# Patient Record
Sex: Female | Born: 1965 | Race: White | Hispanic: No | Smoking: Current every day smoker
Health system: Southern US, Community
[De-identification: ages and names within clinical notes are randomized; demographics above are authoritative.]

## PROBLEM LIST (undated history)

## (undated) ENCOUNTER — Emergency Department (HOSPITAL_COMMUNITY): Admission: EM | Payer: Medicare PPO | Source: Home / Self Care

## (undated) DIAGNOSIS — J189 Pneumonia, unspecified organism: Secondary | ICD-10-CM

## (undated) DIAGNOSIS — F419 Anxiety disorder, unspecified: Secondary | ICD-10-CM

## (undated) DIAGNOSIS — G4700 Insomnia, unspecified: Secondary | ICD-10-CM

## (undated) DIAGNOSIS — E669 Obesity, unspecified: Secondary | ICD-10-CM

## (undated) DIAGNOSIS — R51 Headache: Secondary | ICD-10-CM

## (undated) DIAGNOSIS — K279 Peptic ulcer, site unspecified, unspecified as acute or chronic, without hemorrhage or perforation: Secondary | ICD-10-CM

## (undated) DIAGNOSIS — D649 Anemia, unspecified: Secondary | ICD-10-CM

## (undated) DIAGNOSIS — M1712 Unilateral primary osteoarthritis, left knee: Secondary | ICD-10-CM

## (undated) DIAGNOSIS — T883XXA Malignant hyperthermia due to anesthesia, initial encounter: Secondary | ICD-10-CM

## (undated) DIAGNOSIS — R519 Headache, unspecified: Secondary | ICD-10-CM

## (undated) DIAGNOSIS — C801 Malignant (primary) neoplasm, unspecified: Secondary | ICD-10-CM

## (undated) DIAGNOSIS — K219 Gastro-esophageal reflux disease without esophagitis: Secondary | ICD-10-CM

## (undated) DIAGNOSIS — F32A Depression, unspecified: Secondary | ICD-10-CM

## (undated) DIAGNOSIS — M1990 Unspecified osteoarthritis, unspecified site: Secondary | ICD-10-CM

## (undated) DIAGNOSIS — M1711 Unilateral primary osteoarthritis, right knee: Secondary | ICD-10-CM

## (undated) DIAGNOSIS — I10 Essential (primary) hypertension: Secondary | ICD-10-CM

## (undated) DIAGNOSIS — F329 Major depressive disorder, single episode, unspecified: Secondary | ICD-10-CM

## (undated) DIAGNOSIS — M19012 Primary osteoarthritis, left shoulder: Secondary | ICD-10-CM

## (undated) DIAGNOSIS — M4800 Spinal stenosis, site unspecified: Secondary | ICD-10-CM

## (undated) DIAGNOSIS — F319 Bipolar disorder, unspecified: Secondary | ICD-10-CM

## (undated) DIAGNOSIS — M797 Fibromyalgia: Secondary | ICD-10-CM

## (undated) DIAGNOSIS — J449 Chronic obstructive pulmonary disease, unspecified: Secondary | ICD-10-CM

## (undated) HISTORY — PX: TONSILLECTOMY: SUR1361

## (undated) HISTORY — PX: OTHER SURGICAL HISTORY: SHX169

## (undated) HISTORY — PX: JOINT REPLACEMENT: SHX530

---

## 1984-10-13 HISTORY — PX: DILATION AND CURETTAGE OF UTERUS: SHX78

## 1988-10-13 HISTORY — PX: TUBAL LIGATION: SHX77

## 1996-10-13 HISTORY — PX: ABDOMINAL HYSTERECTOMY: SHX81

## 1999-08-13 ENCOUNTER — Emergency Department (HOSPITAL_COMMUNITY): Admission: EM | Admit: 1999-08-13 | Discharge: 1999-08-13 | Payer: Self-pay | Admitting: Emergency Medicine

## 1999-11-11 ENCOUNTER — Emergency Department (HOSPITAL_COMMUNITY): Admission: EM | Admit: 1999-11-11 | Discharge: 1999-11-11 | Payer: Self-pay | Admitting: Emergency Medicine

## 2001-04-12 ENCOUNTER — Encounter: Admission: RE | Admit: 2001-04-12 | Discharge: 2001-04-12 | Payer: Self-pay | Admitting: *Deleted

## 2001-06-22 ENCOUNTER — Emergency Department (HOSPITAL_COMMUNITY): Admission: EM | Admit: 2001-06-22 | Discharge: 2001-06-23 | Payer: Self-pay

## 2001-06-22 ENCOUNTER — Encounter: Payer: Self-pay | Admitting: Emergency Medicine

## 2001-06-23 ENCOUNTER — Encounter: Payer: Self-pay | Admitting: Emergency Medicine

## 2001-07-01 ENCOUNTER — Emergency Department (HOSPITAL_COMMUNITY): Admission: EM | Admit: 2001-07-01 | Discharge: 2001-07-02 | Payer: Self-pay | Admitting: Emergency Medicine

## 2001-07-02 ENCOUNTER — Encounter: Payer: Self-pay | Admitting: Emergency Medicine

## 2001-10-25 ENCOUNTER — Encounter: Admission: RE | Admit: 2001-10-25 | Discharge: 2001-10-25 | Payer: Self-pay | Admitting: *Deleted

## 2002-05-11 ENCOUNTER — Emergency Department (HOSPITAL_COMMUNITY): Admission: EM | Admit: 2002-05-11 | Discharge: 2002-05-11 | Payer: Self-pay | Admitting: Emergency Medicine

## 2002-05-15 ENCOUNTER — Emergency Department (HOSPITAL_COMMUNITY): Admission: EM | Admit: 2002-05-15 | Discharge: 2002-05-15 | Payer: Self-pay | Admitting: Emergency Medicine

## 2002-05-21 ENCOUNTER — Emergency Department (HOSPITAL_COMMUNITY): Admission: EM | Admit: 2002-05-21 | Discharge: 2002-05-22 | Payer: Self-pay | Admitting: Emergency Medicine

## 2002-05-27 ENCOUNTER — Encounter: Admission: RE | Admit: 2002-05-27 | Discharge: 2002-05-27 | Payer: Self-pay | Admitting: *Deleted

## 2002-06-12 ENCOUNTER — Emergency Department (HOSPITAL_COMMUNITY): Admission: EM | Admit: 2002-06-12 | Discharge: 2002-06-12 | Payer: Self-pay | Admitting: Emergency Medicine

## 2002-08-27 ENCOUNTER — Emergency Department (HOSPITAL_COMMUNITY): Admission: EM | Admit: 2002-08-27 | Discharge: 2002-08-27 | Payer: Self-pay | Admitting: Emergency Medicine

## 2002-11-15 ENCOUNTER — Emergency Department (HOSPITAL_COMMUNITY): Admission: EM | Admit: 2002-11-15 | Discharge: 2002-11-15 | Payer: Self-pay | Admitting: Emergency Medicine

## 2002-12-12 ENCOUNTER — Emergency Department (HOSPITAL_COMMUNITY): Admission: EM | Admit: 2002-12-12 | Discharge: 2002-12-12 | Payer: Self-pay | Admitting: Emergency Medicine

## 2003-01-18 ENCOUNTER — Encounter: Payer: Self-pay | Admitting: Family Medicine

## 2003-01-18 ENCOUNTER — Encounter: Admission: RE | Admit: 2003-01-18 | Discharge: 2003-01-18 | Payer: Self-pay | Admitting: Family Medicine

## 2003-02-08 ENCOUNTER — Encounter: Payer: Self-pay | Admitting: Emergency Medicine

## 2003-02-08 ENCOUNTER — Emergency Department (HOSPITAL_COMMUNITY): Admission: EM | Admit: 2003-02-08 | Discharge: 2003-02-08 | Payer: Self-pay | Admitting: Emergency Medicine

## 2003-03-07 ENCOUNTER — Emergency Department (HOSPITAL_COMMUNITY): Admission: EM | Admit: 2003-03-07 | Discharge: 2003-03-07 | Payer: Self-pay | Admitting: Emergency Medicine

## 2003-03-18 ENCOUNTER — Encounter: Payer: Self-pay | Admitting: Emergency Medicine

## 2003-03-18 ENCOUNTER — Emergency Department (HOSPITAL_COMMUNITY): Admission: EM | Admit: 2003-03-18 | Discharge: 2003-03-18 | Payer: Self-pay | Admitting: Emergency Medicine

## 2003-04-13 ENCOUNTER — Encounter: Payer: Self-pay | Admitting: Emergency Medicine

## 2003-04-13 ENCOUNTER — Emergency Department (HOSPITAL_COMMUNITY): Admission: EM | Admit: 2003-04-13 | Discharge: 2003-04-13 | Payer: Self-pay | Admitting: Emergency Medicine

## 2003-04-24 ENCOUNTER — Encounter: Payer: Self-pay | Admitting: Family Medicine

## 2003-04-24 ENCOUNTER — Encounter: Admission: RE | Admit: 2003-04-24 | Discharge: 2003-04-24 | Payer: Self-pay | Admitting: Family Medicine

## 2003-06-02 ENCOUNTER — Emergency Department (HOSPITAL_COMMUNITY): Admission: EM | Admit: 2003-06-02 | Discharge: 2003-06-02 | Payer: Self-pay

## 2003-06-22 ENCOUNTER — Emergency Department (HOSPITAL_COMMUNITY): Admission: EM | Admit: 2003-06-22 | Discharge: 2003-06-22 | Payer: Self-pay | Admitting: Emergency Medicine

## 2003-06-30 ENCOUNTER — Emergency Department (HOSPITAL_COMMUNITY): Admission: EM | Admit: 2003-06-30 | Discharge: 2003-06-30 | Payer: Self-pay | Admitting: Emergency Medicine

## 2003-06-30 ENCOUNTER — Encounter: Payer: Self-pay | Admitting: Emergency Medicine

## 2003-10-14 HISTORY — PX: KNEE ARTHROSCOPY: SHX127

## 2004-04-24 ENCOUNTER — Emergency Department (HOSPITAL_COMMUNITY): Admission: EM | Admit: 2004-04-24 | Discharge: 2004-04-24 | Payer: Self-pay | Admitting: Emergency Medicine

## 2004-06-10 ENCOUNTER — Emergency Department (HOSPITAL_COMMUNITY): Admission: EM | Admit: 2004-06-10 | Discharge: 2004-06-10 | Payer: Self-pay | Admitting: Emergency Medicine

## 2004-07-06 ENCOUNTER — Emergency Department (HOSPITAL_COMMUNITY): Admission: EM | Admit: 2004-07-06 | Discharge: 2004-07-06 | Payer: Self-pay | Admitting: Emergency Medicine

## 2004-08-18 ENCOUNTER — Encounter: Admission: RE | Admit: 2004-08-18 | Discharge: 2004-08-18 | Payer: Self-pay | Admitting: Family Medicine

## 2004-10-12 ENCOUNTER — Emergency Department (HOSPITAL_COMMUNITY): Admission: EM | Admit: 2004-10-12 | Discharge: 2004-10-12 | Payer: Self-pay | Admitting: Emergency Medicine

## 2007-04-27 ENCOUNTER — Emergency Department (HOSPITAL_COMMUNITY): Admission: EM | Admit: 2007-04-27 | Discharge: 2007-04-27 | Payer: Self-pay | Admitting: Emergency Medicine

## 2007-07-05 ENCOUNTER — Emergency Department (HOSPITAL_COMMUNITY): Admission: EM | Admit: 2007-07-05 | Discharge: 2007-07-05 | Payer: Self-pay | Admitting: Family Medicine

## 2007-10-08 ENCOUNTER — Emergency Department (HOSPITAL_COMMUNITY): Admission: EM | Admit: 2007-10-08 | Discharge: 2007-10-08 | Payer: Self-pay | Admitting: Emergency Medicine

## 2008-03-03 ENCOUNTER — Emergency Department (HOSPITAL_COMMUNITY): Admission: EM | Admit: 2008-03-03 | Discharge: 2008-03-03 | Payer: Self-pay | Admitting: Emergency Medicine

## 2008-07-13 ENCOUNTER — Emergency Department (HOSPITAL_COMMUNITY): Admission: EM | Admit: 2008-07-13 | Discharge: 2008-07-13 | Payer: Self-pay | Admitting: Family Medicine

## 2009-01-04 ENCOUNTER — Encounter (INDEPENDENT_AMBULATORY_CARE_PROVIDER_SITE_OTHER): Payer: Self-pay | Admitting: *Deleted

## 2009-01-04 ENCOUNTER — Emergency Department (HOSPITAL_COMMUNITY): Admission: EM | Admit: 2009-01-04 | Discharge: 2009-01-04 | Payer: Self-pay | Admitting: Family Medicine

## 2009-02-04 ENCOUNTER — Encounter: Admission: RE | Admit: 2009-02-04 | Discharge: 2009-02-04 | Payer: Self-pay | Admitting: Orthopedic Surgery

## 2009-02-21 DIAGNOSIS — IMO0001 Reserved for inherently not codable concepts without codable children: Secondary | ICD-10-CM | POA: Insufficient documentation

## 2009-02-21 DIAGNOSIS — K219 Gastro-esophageal reflux disease without esophagitis: Secondary | ICD-10-CM | POA: Insufficient documentation

## 2009-02-21 DIAGNOSIS — K589 Irritable bowel syndrome without diarrhea: Secondary | ICD-10-CM

## 2009-02-21 DIAGNOSIS — M542 Cervicalgia: Secondary | ICD-10-CM | POA: Insufficient documentation

## 2009-02-21 DIAGNOSIS — F319 Bipolar disorder, unspecified: Secondary | ICD-10-CM | POA: Insufficient documentation

## 2009-02-23 ENCOUNTER — Ambulatory Visit: Payer: Self-pay | Admitting: Gastroenterology

## 2009-02-23 DIAGNOSIS — Z8541 Personal history of malignant neoplasm of cervix uteri: Secondary | ICD-10-CM

## 2009-02-23 DIAGNOSIS — F329 Major depressive disorder, single episode, unspecified: Secondary | ICD-10-CM | POA: Insufficient documentation

## 2009-02-23 DIAGNOSIS — R1310 Dysphagia, unspecified: Secondary | ICD-10-CM | POA: Insufficient documentation

## 2009-02-23 DIAGNOSIS — F411 Generalized anxiety disorder: Secondary | ICD-10-CM | POA: Insufficient documentation

## 2009-02-23 DIAGNOSIS — M129 Arthropathy, unspecified: Secondary | ICD-10-CM | POA: Insufficient documentation

## 2009-03-02 ENCOUNTER — Ambulatory Visit: Payer: Self-pay | Admitting: Gastroenterology

## 2009-03-02 ENCOUNTER — Encounter: Payer: Self-pay | Admitting: Gastroenterology

## 2009-03-06 ENCOUNTER — Encounter: Payer: Self-pay | Admitting: Gastroenterology

## 2009-03-28 ENCOUNTER — Ambulatory Visit: Payer: Self-pay | Admitting: Pulmonary Disease

## 2009-03-28 DIAGNOSIS — G4730 Sleep apnea, unspecified: Secondary | ICD-10-CM

## 2009-03-28 DIAGNOSIS — G4710 Hypersomnia, unspecified: Secondary | ICD-10-CM | POA: Insufficient documentation

## 2009-04-18 ENCOUNTER — Encounter: Payer: Self-pay | Admitting: Pulmonary Disease

## 2009-04-18 ENCOUNTER — Ambulatory Visit (HOSPITAL_BASED_OUTPATIENT_CLINIC_OR_DEPARTMENT_OTHER): Admission: RE | Admit: 2009-04-18 | Discharge: 2009-04-18 | Payer: Self-pay | Admitting: Pulmonary Disease

## 2009-04-25 ENCOUNTER — Ambulatory Visit: Payer: Self-pay | Admitting: Pulmonary Disease

## 2009-05-03 ENCOUNTER — Telehealth (INDEPENDENT_AMBULATORY_CARE_PROVIDER_SITE_OTHER): Payer: Self-pay | Admitting: *Deleted

## 2009-05-22 ENCOUNTER — Ambulatory Visit: Payer: Self-pay | Admitting: Pulmonary Disease

## 2009-05-26 DIAGNOSIS — G2589 Other specified extrapyramidal and movement disorders: Secondary | ICD-10-CM

## 2010-03-13 HISTORY — PX: TRANSTHORACIC ECHOCARDIOGRAM: SHX275

## 2010-03-13 HISTORY — PX: CARDIOVASCULAR STRESS TEST: SHX262

## 2010-03-28 ENCOUNTER — Ambulatory Visit: Payer: Self-pay | Admitting: Cardiology

## 2010-03-28 ENCOUNTER — Observation Stay (HOSPITAL_COMMUNITY): Admission: EM | Admit: 2010-03-28 | Discharge: 2010-03-30 | Payer: Self-pay | Admitting: Emergency Medicine

## 2010-04-08 ENCOUNTER — Encounter: Payer: Self-pay | Admitting: *Deleted

## 2010-04-08 ENCOUNTER — Telehealth (INDEPENDENT_AMBULATORY_CARE_PROVIDER_SITE_OTHER): Payer: Self-pay | Admitting: *Deleted

## 2010-04-09 ENCOUNTER — Encounter: Payer: Self-pay | Admitting: Cardiology

## 2010-04-09 ENCOUNTER — Ambulatory Visit: Payer: Self-pay

## 2010-04-09 ENCOUNTER — Ambulatory Visit: Payer: Self-pay | Admitting: Cardiology

## 2010-04-09 ENCOUNTER — Encounter (HOSPITAL_COMMUNITY): Admission: RE | Admit: 2010-04-09 | Discharge: 2010-06-19 | Payer: Self-pay | Admitting: Cardiology

## 2010-04-11 ENCOUNTER — Ambulatory Visit: Payer: Self-pay

## 2010-04-23 ENCOUNTER — Emergency Department (HOSPITAL_COMMUNITY): Admission: EM | Admit: 2010-04-23 | Discharge: 2010-04-23 | Payer: Self-pay | Admitting: Emergency Medicine

## 2010-05-26 ENCOUNTER — Emergency Department (HOSPITAL_COMMUNITY): Admission: EM | Admit: 2010-05-26 | Discharge: 2010-05-26 | Payer: Self-pay | Admitting: Emergency Medicine

## 2010-08-20 ENCOUNTER — Emergency Department (HOSPITAL_COMMUNITY): Admission: EM | Admit: 2010-08-20 | Discharge: 2010-08-20 | Payer: Self-pay | Admitting: Emergency Medicine

## 2010-11-02 ENCOUNTER — Encounter: Payer: Self-pay | Admitting: Geriatric Medicine

## 2010-11-06 ENCOUNTER — Encounter
Admission: RE | Admit: 2010-11-06 | Discharge: 2010-11-06 | Payer: Self-pay | Source: Home / Self Care | Attending: Unknown Physician Specialty | Admitting: Unknown Physician Specialty

## 2010-11-14 NOTE — Progress Notes (Signed)
Summary: Nuclear Pre-Procedure  Phone Note Outgoing Call Call back at [facility] Phone [phone]   Call placed by: Stanton Kidney, EMT-P,  April 08, 2010 2:35 PM Call placed to: Patient Action Taken: Phone Call Completed Summary of Call: Reviewed information on Myoview Information Sheet (see scanned document for further details).  Spoke with Patient.    Nuclear Med Background Indications for Stress Test: Evaluation for Ischemia, Post Hospital  Indications Comments: 03/30/10 CP: (-) enzymes  History: Abnormal EKG, Echo  History Comments: 03/29/10 Echo: EF=50-55%  Symptoms: Chest Pressure, Nausea    Nuclear Pre-Procedure Cardiac Risk Factors: Obesity, Smoker Height (in): 64

## 2010-11-14 NOTE — Assessment & Plan Note (Signed)
Summary: Cardiology Nuclear Study  Nuclear Med Background Indications for Stress Test: Evaluation for Ischemia, Post Hospital  Indications Comments: 03/30/10 CP: (-) enzymes  History: Abnormal EKG, Echo  History Comments: 03/29/10 Echo: EF=50-55%  Symptoms: Chest Pressure, Nausea    Nuclear Pre-Procedure Cardiac Risk Factors: Obesity, Smoker Caffeine/Decaff Intake: None NPO After: 7:00 AM Lungs: clear IV 0.9% NS with Angio Cath: 22g     IV Site: (R) Hand IV Started by: Irean Hong RN Chest Size (in) 38     Cup Size DD     Height (in): 64 Weight (lb): 288 BMI: 49.61  Nuclear Med Study 1 or 2 day study:  2 day     Stress Test Type:  Eugenie Birks Reading MD:  Olga Millers, MD     Referring MD:  T.Wall Resting Radionuclide:  Technetium 83m Tetrofosmin     Resting Radionuclide Dose:  33 mCi  Stress Radionuclide:  Technetium 68m Tetrofosmin     Stress Radionuclide Dose:  33 mCi   Stress Protocol   Lexiscan: 0.4 mg   Stress Test Technologist:  Milana Na EMT-P     Nuclear Technologist:  Domenic Polite CNMT  Rest Procedure  Myocardial perfusion imaging was performed at rest 45 minutes following the intravenous administration of Myoview Technetium 78m Tetrofosmin.  Stress Procedure  The patient received IV Lexiscan 0.4 mg over 15-seconds.  Myoview injected at 30-seconds.  There were no significant changes with infusion.  Quantitative spect images were obtained after a 45 minute delay.  QPS Raw Data Images:  Mild breast attenuation.  Normal left ventricular size. Stress Images:  There is mild decreased uptake in the anterior wall. Rest Images:  There is mild decreased uptake in the anterior wall. Subtraction (SDS):  There is a fixed anterior defect that is most consistent with breast attenuation. Transient Ischemic Dilatation:  .99  (Normal <1.22)  Lung/Heart Ratio:  .28  (Normal <0.45)  Quantitative Gated Spect Images QGS EDV:  108 ml QGS ESV:  38 ml QGS EF:  65  % QGS cine images:  Normal wall motion.   Overall Impression  Exercise Capacity: Lexiscan study with no exercise. BP Response: Normal blood pressure response. Clinical Symptoms: There is  chest pain ECG Impression: No significant ST segment change suggestive of ischemia. Overall Impression: There is mild breast attenuation but no sign of scar or ischemia.  Appended Document: Cardiology Nuclear Study  Reviewed Juanito Doom, MD  Appended Document: Cardiology Nuclear Study PT AWARE./CY

## 2010-12-24 LAB — RAPID URINE DRUG SCREEN, HOSP PERFORMED
Amphetamines: NOT DETECTED
Benzodiazepines: NOT DETECTED
Opiates: POSITIVE — AB
Tetrahydrocannabinol: NOT DETECTED

## 2010-12-24 LAB — URINALYSIS, ROUTINE W REFLEX MICROSCOPIC
Hgb urine dipstick: NEGATIVE
Ketones, ur: NEGATIVE mg/dL
Nitrite: NEGATIVE
Protein, ur: NEGATIVE mg/dL

## 2010-12-27 LAB — CBC
MCV: 82.1 fL (ref 78.0–100.0)
RDW: 14.2 % (ref 11.5–15.5)

## 2010-12-27 LAB — COMPREHENSIVE METABOLIC PANEL
ALT: 24 U/L (ref 0–35)
Alkaline Phosphatase: 90 U/L (ref 39–117)
BUN: 7 mg/dL (ref 6–23)
Calcium: 10.5 mg/dL (ref 8.4–10.5)
Creatinine, Ser: 0.8 mg/dL (ref 0.4–1.2)
Glucose, Bld: 107 mg/dL — ABNORMAL HIGH (ref 70–99)
Potassium: 3.9 mEq/L (ref 3.5–5.1)
Total Protein: 8 g/dL (ref 6.0–8.3)

## 2010-12-27 LAB — POCT CARDIAC MARKERS
CKMB, poc: 1.6 ng/mL (ref 1.0–8.0)
Myoglobin, poc: 87 ng/mL (ref 12–200)
Troponin i, poc: <0.05 ng/mL (ref 0.00–0.09)

## 2010-12-27 LAB — DIFFERENTIAL
Basophils Absolute: 0 10*3/uL (ref 0.0–0.1)
Basophils Relative: 0 % (ref 0–1)
Eosinophils Absolute: 0.1 10*3/uL (ref 0.0–0.7)
Lymphocytes Relative: 31 % (ref 12–46)
Lymphs Abs: 2.7 10*3/uL (ref 0.7–4.0)
Neutro Abs: 5.3 10*3/uL (ref 1.7–7.7)
Neutrophils Relative %: 61 % (ref 43–77)

## 2010-12-30 LAB — COMPREHENSIVE METABOLIC PANEL
Albumin: 3.2 g/dL — ABNORMAL LOW (ref 3.5–5.2)
BUN: 8 mg/dL (ref 6–23)
Calcium: 8.9 mg/dL (ref 8.4–10.5)
Creatinine, Ser: 0.78 mg/dL (ref 0.4–1.2)
Total Protein: 6 g/dL (ref 6.0–8.3)

## 2010-12-30 LAB — CBC
HCT: 34.3 % — ABNORMAL LOW (ref 36.0–46.0)
HCT: 35 % — ABNORMAL LOW (ref 36.0–46.0)
MCHC: 34.2 g/dL (ref 30.0–36.0)
MCV: 85 fL (ref 78.0–100.0)
MCV: 85.2 fL (ref 78.0–100.0)
MCV: 85.4 fL (ref 78.0–100.0)
Platelets: 235 10*3/uL (ref 150–400)
Platelets: 246 10*3/uL (ref 150–400)
Platelets: 250 10*3/uL (ref 150–400)
RBC: 4.11 MIL/uL (ref 3.87–5.11)
RDW: 14.4 % (ref 11.5–15.5)
RDW: 14.8 % (ref 11.5–15.5)
WBC: 6.7 10*3/uL (ref 4.0–10.5)

## 2010-12-30 LAB — MAGNESIUM: Magnesium: 2.2 mg/dL (ref 1.5–2.5)

## 2010-12-30 LAB — DIFFERENTIAL
Basophils Absolute: 0 10*3/uL (ref 0.0–0.1)
Basophils Relative: 0 % (ref 0–1)
Eosinophils Absolute: 0.2 10*3/uL (ref 0.0–0.7)
Lymphocytes Relative: 45 % (ref 12–46)
Lymphs Abs: 3.4 10*3/uL (ref 0.7–4.0)
Monocytes Absolute: 0.4 10*3/uL (ref 0.1–1.0)
Monocytes Relative: 6 % (ref 3–12)
Neutro Abs: 2.9 10*3/uL (ref 1.7–7.7)
Neutrophils Relative %: 39 % — ABNORMAL LOW (ref 43–77)

## 2010-12-30 LAB — POCT CARDIAC MARKERS
Myoglobin, poc: 45 ng/mL (ref 12–200)
Troponin i, poc: <0.05 ng/mL (ref 0.00–0.09)
Troponin i, poc: <0.05 ng/mL (ref 0.00–0.09)

## 2010-12-30 LAB — BASIC METABOLIC PANEL
BUN: 11 mg/dL (ref 6–23)
BUN: 8 mg/dL (ref 6–23)
Calcium: 9.4 mg/dL (ref 8.4–10.5)
Chloride: 106 mEq/L (ref 96–112)
Chloride: 109 mEq/L (ref 96–112)
Creatinine, Ser: 0.87 mg/dL (ref 0.4–1.2)
GFR calc Af Amer: >60 mL/min (ref >60)
GFR calc non Af Amer: >60 mL/min (ref >60)
Glucose, Bld: 98 mg/dL (ref 70–99)

## 2010-12-30 LAB — CARDIAC PANEL(CRET KIN+CKTOT+MB+TROPI)
CK, MB: 1.3 ng/mL (ref 0.3–4.0)
Relative Index: 1.3 (ref 0.0–2.5)
Relative Index: INVALID (ref 0.0–2.5)
Total CK: 97 U/L (ref 7–177)
Total CK: 98 U/L (ref 7–177)
Troponin I: 0.01 ng/mL (ref 0.00–0.06)
Troponin I: 0.01 ng/mL (ref 0.00–0.06)

## 2010-12-30 LAB — LIPID PANEL
HDL: 37 mg/dL — ABNORMAL LOW (ref >39)
Triglycerides: 151 mg/dL — ABNORMAL HIGH (ref <150)
VLDL: 30 mg/dL (ref 0–40)

## 2010-12-30 LAB — TSH: TSH: 1.042 u[IU]/mL (ref 0.350–4.500)

## 2010-12-30 LAB — PHOSPHORUS: Phosphorus: 4.1 mg/dL (ref 2.3–4.6)

## 2011-02-25 NOTE — Procedures (Signed)
[name], [name]               ACCOUNT NO.:  [id number]   MEDICAL RECORD NO.:  [id number]          PATIENT TYPE:  OUT   LOCATION:  SLEEP CENTER                 FACILITY:  [facility]   PHYSICIAN:  Oretha Milch, MD      DATE OF BIRTH:  [birth year]   DATE OF STUDY:  04/18/2009                            NOCTURNAL POLYSOMNOGRAM   REFERRING PHYSICIAN:   INDICATION FOR THE STUDY:  Excessive daytime somnolence, loud snoring,  and witnessed apneas in this 45 year old woman.  At the time of the  study, her weight was 260 pounds, height feet 4 inches, BMI 45, neck  size 16.5 inches.   This overnight polysomnogram was performed with a sleep technologist in  attendance.  EEG, EOG, EMG, EKG, and respiratory parameters were  recorded.  Sleep stages, arousals, limb movements, and respiratory data  were scored according to criteria laid out by the American Academy of  Sleep Medicine.   SLEEP ARCHITECTURE:  Lights out was at 9:10 p.m., lights on was at 4:44  a.m.  Total sleep time was 342 minutes with a sleep period time of 374  minutes and a sleep maintenance efficiency of 82%.  Sleep latency was 80  minutes and latency to REM sleep was 91 minutes.  Sleep stages are the  percentage of total sleep time was N1 4%, N2 84%, N3 0% and REM sleep  12% (41.5 minutes, she spent 120 minutes in the supine position and 22  minutes in the supine REM sleep).   AROUSAL DATA:  There were a total of 47 arousals with an arousal index  of 8 events per hour, of these 35 were spontaneous, and the rest were  associated with respiratory events.   RESPIRATORY DATA:  There were a total of 3 obstructive apnea, 0 central  apnea, 0 mixed apneas, and 23 hypopneas and an apnea/hypopnea index of  4.6 events per hour.  There were 6 respiratory effort related arousals  (RERAs) with an RDI of 5.6 events per hour.  The supine AHI was 9.4  events per hour and the REM AHI was 19 events per hour.  The REM supine  AHI was 29 events  per hour, (1 apnea and 10 hypopneas in 22 minutes of  REM supine sleep).  Longest apnea was 18 seconds and longest hypopnea  was 29 seconds.   OXYGEN SATURATION DATA:  The lowest desaturation was 84% during REM  sleep.  She spent 1 minute with a saturation less than 98%.   LIMB MOVEMENT DATA:  No significant limb movements were noted.   CARDIAC DATA:  Low heart rate was 48 beats per minute during non-REM  sleep.   DISCUSSION:  Events predominantly noted during REM supine sleep.  No  slow wave sleep was noted.  She did Xanax and 2 tablets of oxycodone at  bedtime.  She does report worsening sleep disordered breathing when she  takes Benadryl.   IMPRESSION:  1. Mild sleep disordered breathing with predominant hypopneas during      rapid eye movement, supine sleep.  This did not meet criteria for      intervention.  2. No evidence  of cardiac arrhythmias, limb movements, or behavioral      disturbance during sleep.   RECOMMENDATIONS:  The treatment options for this degree of sleep  disorder breathing include:  1. Weight loss.  2. She should avoid sleeping in the supine position as much as      possible.  3. She should be asked to avoid medications with sedative side effects      such as Benadryl.  She should be asked to avoid driving when      sleepy.  4. If there is further weight gain, consider repeating the study to      see if she meets criteria for intervention.      Oretha Milch, MD  Electronically Signed     RVA/MEDQ  D:  04/25/2009 16:41:35  T:  04/26/2009 03:49:54  Job:  [id number]   cc:   Derrek Gu, MD

## 2011-05-02 ENCOUNTER — Other Ambulatory Visit: Payer: Self-pay | Admitting: Pain Medicine

## 2011-05-02 DIAGNOSIS — M545 Low back pain, unspecified: Secondary | ICD-10-CM

## 2011-05-11 ENCOUNTER — Ambulatory Visit
Admission: RE | Admit: 2011-05-11 | Discharge: 2011-05-11 | Disposition: A | Discharge status: Home / Self Care | Payer: Medicare Other | Source: Ambulatory Visit | Attending: Pain Medicine | Admitting: Pain Medicine

## 2011-05-11 DIAGNOSIS — M545 Low back pain: Secondary | ICD-10-CM

## 2011-09-17 ENCOUNTER — Encounter: Payer: Self-pay | Admitting: *Deleted

## 2011-09-17 ENCOUNTER — Emergency Department (HOSPITAL_COMMUNITY): Payer: Medicare Other

## 2011-09-17 ENCOUNTER — Other Ambulatory Visit: Payer: Self-pay

## 2011-09-17 ENCOUNTER — Emergency Department (HOSPITAL_COMMUNITY)
Admission: EM | Admit: 2011-09-17 | Discharge: 2011-09-17 | Disposition: A | Discharge status: Home / Self Care | Payer: Medicare Other | Attending: Emergency Medicine | Admitting: Emergency Medicine

## 2011-09-17 DIAGNOSIS — R05 Cough: Secondary | ICD-10-CM | POA: Insufficient documentation

## 2011-09-17 DIAGNOSIS — IMO0001 Reserved for inherently not codable concepts without codable children: Secondary | ICD-10-CM | POA: Insufficient documentation

## 2011-09-17 DIAGNOSIS — M545 Low back pain, unspecified: Secondary | ICD-10-CM | POA: Insufficient documentation

## 2011-09-17 DIAGNOSIS — R110 Nausea: Secondary | ICD-10-CM | POA: Insufficient documentation

## 2011-09-17 DIAGNOSIS — J069 Acute upper respiratory infection, unspecified: Secondary | ICD-10-CM

## 2011-09-17 DIAGNOSIS — R079 Chest pain, unspecified: Secondary | ICD-10-CM | POA: Insufficient documentation

## 2011-09-17 DIAGNOSIS — R0602 Shortness of breath: Secondary | ICD-10-CM | POA: Insufficient documentation

## 2011-09-17 DIAGNOSIS — R059 Cough, unspecified: Secondary | ICD-10-CM | POA: Insufficient documentation

## 2011-09-17 DIAGNOSIS — Z79899 Other long term (current) drug therapy: Secondary | ICD-10-CM | POA: Insufficient documentation

## 2011-09-17 DIAGNOSIS — F319 Bipolar disorder, unspecified: Secondary | ICD-10-CM | POA: Insufficient documentation

## 2011-09-17 DIAGNOSIS — E669 Obesity, unspecified: Secondary | ICD-10-CM | POA: Insufficient documentation

## 2011-09-17 DIAGNOSIS — J3489 Other specified disorders of nose and nasal sinuses: Secondary | ICD-10-CM | POA: Insufficient documentation

## 2011-09-17 HISTORY — DX: Fibromyalgia: M79.7

## 2011-09-17 HISTORY — DX: Bipolar disorder, unspecified: F31.9

## 2011-09-17 MED ORDER — HYDROCOD POLST-CHLORPHEN POLST 10-8 MG/5ML PO LQCR: 5.0000 mL | Freq: Every evening | ORAL | Status: DC | PRN

## 2011-09-17 MED ORDER — ALBUTEROL SULFATE HFA 108 (90 BASE) MCG/ACT IN AERS
2.0000 | INHALATION_SPRAY | Freq: Once | RESPIRATORY_TRACT | Status: AC
  Administered 2011-09-17: 2 via RESPIRATORY_TRACT
  Filled 2011-09-17: qty 6.7

## 2011-09-17 NOTE — Discharge Instructions (Signed)
Take tussionex as needed for cough before going to bed.  Do not drive within four hours of taking this medication (may cause drowsiness or confusion).  Use albuterol inhaler, 2 puffs every 4 hours, as needed for cough and shortness of breath.  Get rest and drink plenty of fluids.  Follow up with your primary care doctor.  Call [facility] [phone]) if you do not have a primary care doctor and would like assistance with finding one.   You should return to the ER if your shortness of breath worsens.

## 2011-09-17 NOTE — ED Notes (Signed)
Reports 3 days of n/v, fever, body aches.

## 2011-09-17 NOTE — ED Provider Notes (Signed)
History     CSN: [id number] Arrival date & time: 09/17/2011 10:10 AM   First MD Initiated Contact with Patient 09/17/11 1208      Chief Complaint  Patient presents with  . Influenza    (Consider location/radiation/quality/duration/timing/severity/associated sxs/prior treatment) HPI History provided by pt.  Pt has had cough and SOB for the past 3 days.  SOB aggravated by exertion.  Associated w/ f/c, nasal congestion, rhinorrhea, sinus pressure, nausea and body aches.  Denies CP. No h/o pulmonary/cardiac dz.  No known sick contacts.     Past Medical History  Diagnosis Date  . Fibromyalgia   . Bipolar 1 disorder     History reviewed. No pertinent past surgical history.  History reviewed. No pertinent family history.  History  Substance Use Topics  . Smoking status: Current Everyday Smoker -- 0.5 packs/day    Types: Cigarettes  . Smokeless tobacco: Not on file  . Alcohol Use: No    OB History    Grav Para Term Preterm Abortions TAB SAB Ect Mult Living                  Review of Systems  All other systems reviewed and are negative.    Allergies  Naproxen  Home Medications   Current Outpatient Rx  Name Route Sig Dispense Refill  . ALPRAZOLAM 2 MG PO TABS Oral Take 2 mg by mouth 3 (three) times daily.      Marland Kitchen DIVALPROEX SODIUM ER 500 MG PO TB24 Oral Take 1,500 mg by mouth every evening.      . DULOXETINE HCL 60 MG PO CPEP Oral Take 60 mg by mouth daily.      Marland Kitchen GABAPENTIN (PHN) 600 MG PO TABS Oral Take 1 tablet by mouth 3 (three) times daily.      Marland Kitchen HYDROCODONE-ACETAMINOPHEN 10-325 MG PO TABS Oral Take 1 tablet by mouth 3 (three) times daily.        BP 113/75  Pulse 90  Temp(Src) 98.9 F (37.2 C) (Oral)  Resp 28  SpO2 91%  Physical Exam  Nursing note and vitals reviewed. Constitutional: She is oriented to person, place, and time. She appears well-developed and well-nourished. No distress.       Obese  HENT:  Head: Normocephalic and atraumatic.  Right  Ear: External ear normal.  Left Ear: External ear normal.  Mouth/Throat: Oropharynx is clear and moist. No oropharyngeal exudate.  Eyes:       Normal appearance  Neck: Normal range of motion.  Cardiovascular: Normal rate and regular rhythm.   Pulmonary/Chest: Effort normal and breath sounds normal. No respiratory distress.       Diffuse, mild chest ttp  Abdominal: Soft. Bowel sounds are normal. She exhibits no distension. There is no tenderness.  Musculoskeletal: She exhibits no edema and no tenderness.       Diffuse low back ttp  Lymphadenopathy:    She has no cervical adenopathy.  Neurological: She is alert and oriented to person, place, and time.  Skin: Skin is warm and dry. No rash noted.  Psychiatric: She has a normal mood and affect. Her behavior is normal.    ED Course  Procedures (including critical care time)  Date: 09/17/2011  Rate: 84  Rhythm: normal sinus rhythm  QRS Axis: normal  Intervals: normal  ST/T Wave abnormalities: nonspecific T wave changes  Conduction Disutrbances:none  Narrative Interpretation:   Old EKG Reviewed: unchanged   Labs Reviewed - No data to display Dg Chest 2  View  09/17/2011  *RADIOLOGY REPORT*  Clinical Data: Shortness of breath, cough, fever  CHEST - 2 VIEW  Comparison: Her chest x-ray of 05/26/2010  Findings: Linear opacity in the right mid lung is most consistent with atelectasis.  No focal infiltrate or effusion is seen. There is some peribronchial thickening which may indicate bronchitis. The heart is within upper limits of normal.  No bony abnormality is seen.  IMPRESSION: No pneumonia.  Question bronchitis. Probable linear atelectasis in the right mid lung.  Original Report Authenticated By: Juline Patch, M.D.     1. Viral upper respiratory tract infection       MDM  Pt presents w/ URI sx.  CXR and EKG ordered d/t c/o SOB.  CXR shows questionable bronchitis and EKG non-ischemic.  Results discussed w/ pt.  Will treat  conservatively for viral infection.  She received an albuterol inhaler and d/c'd home w/ tussionex.  Recommended rest and fluids.  Return precautions discussed.         Arie Sabina [place], [place] 09/17/11 [phone]

## 2011-09-18 NOTE — ED Provider Notes (Signed)
Medical screening examination/treatment/procedure(s) were performed by non-physician practitioner and as supervising physician I was immediately available for consultation/collaboration.   Shelda Jakes, MD 09/18/11 [phone]

## 2011-10-05 ENCOUNTER — Encounter (HOSPITAL_COMMUNITY): Payer: Self-pay | Admitting: Emergency Medicine

## 2011-10-05 ENCOUNTER — Emergency Department (HOSPITAL_COMMUNITY)
Admission: EM | Admit: 2011-10-05 | Discharge: 2011-10-06 | Disposition: A | Discharge status: Home / Self Care | Payer: Medicare Other | Attending: Emergency Medicine | Admitting: Emergency Medicine

## 2011-10-05 ENCOUNTER — Emergency Department (HOSPITAL_COMMUNITY): Payer: Medicare Other

## 2011-10-05 DIAGNOSIS — S1091XA Abrasion of unspecified part of neck, initial encounter: Secondary | ICD-10-CM

## 2011-10-05 DIAGNOSIS — Z79899 Other long term (current) drug therapy: Secondary | ICD-10-CM | POA: Insufficient documentation

## 2011-10-05 DIAGNOSIS — R269 Unspecified abnormalities of gait and mobility: Secondary | ICD-10-CM | POA: Insufficient documentation

## 2011-10-05 DIAGNOSIS — M25473 Effusion, unspecified ankle: Secondary | ICD-10-CM | POA: Insufficient documentation

## 2011-10-05 DIAGNOSIS — S93409A Sprain of unspecified ligament of unspecified ankle, initial encounter: Secondary | ICD-10-CM | POA: Insufficient documentation

## 2011-10-05 DIAGNOSIS — F319 Bipolar disorder, unspecified: Secondary | ICD-10-CM | POA: Insufficient documentation

## 2011-10-05 DIAGNOSIS — IMO0002 Reserved for concepts with insufficient information to code with codable children: Secondary | ICD-10-CM | POA: Insufficient documentation

## 2011-10-05 DIAGNOSIS — Y92009 Unspecified place in unspecified non-institutional (private) residence as the place of occurrence of the external cause: Secondary | ICD-10-CM | POA: Insufficient documentation

## 2011-10-05 DIAGNOSIS — I252 Old myocardial infarction: Secondary | ICD-10-CM | POA: Insufficient documentation

## 2011-10-05 DIAGNOSIS — M25476 Effusion, unspecified foot: Secondary | ICD-10-CM | POA: Insufficient documentation

## 2011-10-05 DIAGNOSIS — M25579 Pain in unspecified ankle and joints of unspecified foot: Secondary | ICD-10-CM | POA: Insufficient documentation

## 2011-10-05 MED ORDER — HYDROCODONE-ACETAMINOPHEN 5-325 MG PO TABS
1.0000 | ORAL_TABLET | Freq: Once | ORAL | Status: AC
  Administered 2011-10-05: 1 via ORAL
  Filled 2011-10-05: qty 1

## 2011-10-05 MED ORDER — TETANUS-DIPHTHERIA TOXOIDS TD 5-2 LFU IM INJ
0.5000 mL | INJECTION | Freq: Once | INTRAMUSCULAR | Status: AC
  Administered 2011-10-05: 0.5 mL via INTRAMUSCULAR
  Filled 2011-10-05: qty 0.5

## 2011-10-05 MED ORDER — HYDROCODONE-ACETAMINOPHEN 5-325 MG PO TABS: 1.0000 | ORAL_TABLET | Freq: Once | ORAL | Status: AC
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
## 2011-10-09 NOTE — ED Provider Notes (Signed)
Medical screening examination/treatment/procedure(s) were performed by non-physician practitioner and as supervising physician I was immediately available for consultation/collaboration.   Monroe Qin M Oma Alpert, MD 10/09/11 0438 

## 2012-01-26 ENCOUNTER — Emergency Department (HOSPITAL_COMMUNITY): Payer: Medicare Other

## 2012-01-26 ENCOUNTER — Emergency Department (HOSPITAL_COMMUNITY)
Admission: EM | Admit: 2012-01-26 | Discharge: 2012-01-26 | Disposition: A | Discharge status: Home / Self Care | Payer: Medicare Other | Attending: Emergency Medicine | Admitting: Emergency Medicine

## 2012-01-26 ENCOUNTER — Encounter (HOSPITAL_COMMUNITY): Payer: Self-pay | Admitting: Emergency Medicine

## 2012-01-26 DIAGNOSIS — R10813 Right lower quadrant abdominal tenderness: Secondary | ICD-10-CM | POA: Insufficient documentation

## 2012-01-26 DIAGNOSIS — R10817 Generalized abdominal tenderness: Secondary | ICD-10-CM | POA: Insufficient documentation

## 2012-01-26 DIAGNOSIS — R197 Diarrhea, unspecified: Secondary | ICD-10-CM | POA: Insufficient documentation

## 2012-01-26 DIAGNOSIS — R110 Nausea: Secondary | ICD-10-CM | POA: Insufficient documentation

## 2012-01-26 DIAGNOSIS — R109 Unspecified abdominal pain: Secondary | ICD-10-CM | POA: Insufficient documentation

## 2012-01-26 HISTORY — DX: Spinal stenosis, site unspecified: M48.00

## 2012-01-26 LAB — CBC
HCT: 40.3 % (ref 36.0–46.0)
MCV: 83.3 fL (ref 78.0–100.0)
RBC: 4.84 MIL/uL (ref 3.87–5.11)
WBC: 12.6 10*3/uL — ABNORMAL HIGH (ref 4.0–10.5)

## 2012-01-26 LAB — BASIC METABOLIC PANEL
CO2: 27 mEq/L (ref 19–32)
Calcium: 9.3 mg/dL (ref 8.4–10.5)
Creatinine, Ser: 0.77 mg/dL (ref 0.50–1.10)
Glucose, Bld: 91 mg/dL (ref 70–99)

## 2012-01-26 LAB — DIFFERENTIAL
Eosinophils Relative: 1 % (ref 0–5)
Lymphocytes Relative: 26 % (ref 12–46)
Lymphs Abs: 3.3 10*3/uL (ref 0.7–4.0)
Monocytes Absolute: 0.6 10*3/uL (ref 0.1–1.0)

## 2012-01-26 LAB — URINALYSIS, ROUTINE W REFLEX MICROSCOPIC
Glucose, UA: NEGATIVE mg/dL
Hgb urine dipstick: NEGATIVE
Ketones, ur: NEGATIVE mg/dL
Protein, ur: NEGATIVE mg/dL

## 2012-01-26 LAB — URINE MICROSCOPIC-ADD ON

## 2012-01-26 MED ORDER — ONDANSETRON HCL 4 MG PO TABS: 4.0000 mg | ORAL_TABLET | Freq: Three times a day (TID) | ORAL | Status: AC | PRN

## 2012-01-26 MED ORDER — MORPHINE SULFATE 4 MG/ML IJ SOLN
4.0000 mg | Freq: Once | INTRAMUSCULAR | Status: AC
  Administered 2012-01-26: 4 mg via INTRAVENOUS

## 2012-01-26 MED ORDER — IOHEXOL 300 MG/ML  SOLN
100.0000 mL | Freq: Once | INTRAMUSCULAR | Status: AC | PRN
  Administered 2012-01-26: 100 mL via INTRAVENOUS

## 2012-01-26 MED ORDER — ONDANSETRON HCL 4 MG/2ML IJ SOLN
4.0000 mg | Freq: Once | INTRAMUSCULAR | Status: AC
  Administered 2012-01-26: 4 mg via INTRAVENOUS
  Filled 2012-01-26: qty 2

## 2012-01-26 NOTE — ED Notes (Signed)
Pt is aware of the need for urine. 

## 2012-01-26 NOTE — ED Notes (Signed)
Pt states she is having abd pain that started earlier today in her right lower quadrant   Pt states she was seen by her PCP today and was sent here for a CT scan because she had an elevated WBC

## 2012-01-26 NOTE — ED Notes (Signed)
Pt c/o right side abdominal pain that began earlier today. Pt also c/o nausea and diarrhea that began yesterday. Pt was seen by PCP earlier today for these symptoms and was told to come here for CT scan. Pt had WBC of 14.2 based on blood work done at PCP.

## 2012-01-26 NOTE — ED Provider Notes (Signed)
History     CSN: [id number]  Arrival date & time 01/26/12  4098   First MD Initiated Contact with Patient 01/26/12 2041      Chief Complaint  Patient presents with  . Abdominal Pain    (Consider location/radiation/quality/duration/timing/severity/associated sxs/prior treatment) HPI Comments: Patient reports that last night she developed diffuse abdominal pain, nausea, diarrhea, and chills.  Today, she has had persistent RLQ pain.  She was seen at [facility] and was found to have a WBC count of 14 and a negative urine dip.  Pt was sent to ED for CT scan.  Pt denies   Patient is a 46 y.o. female presenting with abdominal pain. The history is provided by the patient.  Abdominal Pain The primary symptoms of the illness include abdominal pain, nausea and diarrhea. The primary symptoms of the illness do not include shortness of breath, vomiting, dysuria, vaginal discharge or vaginal bleeding.  Symptoms associated with the illness do not include constipation, urgency or frequency.    Past Medical History  Diagnosis Date  . Fibromyalgia   . Bipolar 1 disorder   . MI (myocardial infarction)   . Spinal stenosis     Past Surgical History  Procedure Date  . Abdominal hysterectomy   . Arthroscopic knee     Family History  Problem Relation Age of Onset  . Diabetes Other   . Hypertension Other     History  Substance Use Topics  . Smoking status: Current Everyday Smoker -- 0.5 packs/day    Types: Cigarettes  . Smokeless tobacco: Not on file  . Alcohol Use: No    OB History    Grav Para Term Preterm Abortions TAB SAB Ect Mult Living                  Review of Systems  HENT: Negative for sore throat.   Respiratory: Negative for cough and shortness of breath.   Cardiovascular: Negative for chest pain.  Gastrointestinal: Positive for nausea, abdominal pain and diarrhea. Negative for vomiting, constipation and blood in stool.  Genitourinary: Negative for dysuria, urgency,  frequency, vaginal bleeding, vaginal discharge and menstrual problem.  Neurological: Negative for syncope, weakness and numbness.  All other systems reviewed and are negative.    Allergies  Naproxen  Home Medications   Current Outpatient Rx  Name Route Sig Dispense Refill  . ALPRAZOLAM 2 MG PO TABS Oral Take 2 mg by mouth 3 (three) times daily.      Marland Kitchen DIVALPROEX SODIUM ER 500 MG PO TB24 Oral Take 1,500 mg by mouth every evening.      . DULOXETINE HCL 60 MG PO CPEP Oral Take 60 mg by mouth daily.      Marland Kitchen GABAPENTIN 600 MG PO TABS Oral Take 600 mg by mouth 3 (three) times daily.    Marland Kitchen HYDROCODONE-ACETAMINOPHEN 10-325 MG PO TABS Oral Take 1 tablet by mouth 3 (three) times daily.        BP 134/86  Pulse 99  Temp(Src) 98.8 F (37.1 C) (Oral)  Resp 18  Ht 5\' 5"  (1.651 m)  Wt 285 lb (129.275 kg)  BMI 47.43 kg/m2  SpO2 98%  Physical Exam  Constitutional: She is oriented to person, place, and time. She appears well-developed and well-nourished.  HENT:  Head: Normocephalic and atraumatic.  Neck: Neck supple.  Cardiovascular: Normal rate, regular rhythm and normal heart sounds.   Pulmonary/Chest: Breath sounds normal. No respiratory distress. She has no wheezes. She has no rales. She  exhibits no tenderness.  Abdominal: Soft. Bowel sounds are normal. She exhibits no distension. There is tenderness in the right lower quadrant. There is no rebound, no guarding and no CVA tenderness.       Abdomen is obese.  Generalized tenderness, worst in RLQ.    Neurological: She is alert and oriented to person, place, and time.  Psychiatric: She has a normal mood and affect. Her behavior is normal. Judgment and thought content normal.    ED Course  Procedures (including critical care time)  Labs Reviewed  CBC - Abnormal; Notable for the following:    WBC 12.6 (*)    All other components within normal limits  DIFFERENTIAL - Abnormal; Notable for the following:    Neutro Abs 8.6 (*)    All other  components within normal limits  BASIC METABOLIC PANEL - Abnormal; Notable for the following:    Potassium 3.2 (*)    All other components within normal limits  URINALYSIS, ROUTINE W REFLEX MICROSCOPIC - Abnormal; Notable for the following:    APPearance CLOUDY (*)    Leukocytes, UA TRACE (*)    All other components within normal limits  URINE MICROSCOPIC-ADD ON - Abnormal; Notable for the following:    Squamous Epithelial / LPF MANY (*)    Bacteria, UA MANY (*)    All other components within normal limits  URINE CULTURE   Ct Abdomen Pelvis W Contrast  01/26/2012  *RADIOLOGY REPORT*  Clinical Data: Right lower quadrant abdominal pain and leukocytosis.  CT ABDOMEN AND PELVIS WITH CONTRAST  Technique:  Multidetector CT imaging of the abdomen and pelvis was performed following the standard protocol during bolus administration of intravenous contrast.  Contrast: OMNIPAQUE IOHEXOL 300 MG/ML  SOLN  Comparison: MRI of the lumbar spine performed 05/11/2011  Findings: The visualized lung bases are clear.  The liver and spleen are unremarkable in appearance.  The gallbladder is within normal limits.  The pancreas and adrenal glands are unremarkable.  The kidneys are unremarkable in appearance.  There is no evidence of hydronephrosis.  No renal or ureteral stones are seen.  No perinephric stranding is appreciated.  No free fluid is identified.  The small bowel is unremarkable in appearance.  The stomach is within normal limits.  No acute vascular abnormalities are seen.  The appendix is normal in caliber, without evidence for appendicitis.  The colon is grossly unremarkable in appearance; contrast progresses to the rectum.  The bladder is decompressed and grossly unremarkable in appearance. The patient is status post hysterectomy; no suspicious adnexal masses are seen.  The ovaries are relatively symmetric.  No inguinal lymphadenopathy is seen.  No acute osseous abnormalities are identified.  Facet disease  is noted at L4-L5, with associated disc space narrowing, vacuum phenomenon and mild grade 1 anterolisthesis of L4 on L5.  IMPRESSION:  1.  No acute abnormalities seen within the abdomen or pelvis. 2.  Degenerative change noted at L4-L5, better characterized on prior MRI.  Original Report Authenticated By: Tonia Ghent, M.D.   11:24 PM Patient's pain is well controlled in the department.  Discussed results with patient.  Plan is for d/c home with nausea medication.  Pt declines pain medication.  Discussed return precautions, return for worsening symptoms, including fever.  Patient verbalizes understanding and agrees with plan.     1. Abdominal pain   2. Diarrhea   3. Nausea       MDM  Patient sent to ED by urgent care for CT  scan, concern for appendicitis.  Pt w/ abdominal pain, nausea, diarrhea.  She is afebrile, CT scan showed normal appendix and no other abnormalities that might explain patient's symptoms.  Pt may be having GI virus.  Pt afebrile and symptoms relieved in ED. Discussed return precautions with patient.   Patient verbalizes understanding and agrees with plan.          Dillard Cannon [place], [place] 01/27/12 [phone]

## 2012-01-26 NOTE — Discharge Instructions (Signed)
Read the information below.  Follow up with your primary care provider.  If you have any worsening symptoms, including fever greater than 100.4, uncontrolled pain, or inability to tolerate fluids by mouth, please return to the ER for a recheck.You may return to the ER at any time for worsening condition or any new symptoms that concern you.  Abdominal Pain Abdominal pain can be caused by many things. Your caregiver decides the seriousness of your pain by an examination and possibly blood tests and X-rays. Many cases can be observed and treated at home. Most abdominal pain is not caused by a disease and will probably improve without treatment. However, in many cases, more time must pass before a clear cause of the pain can be found. Before that point, it may not be known if you need more testing, or if hospitalization or surgery is needed. HOME CARE INSTRUCTIONS   Do not take laxatives unless directed by your caregiver.   Take pain medicine only as directed by your caregiver.   Only take over-the-counter or prescription medicines for pain, discomfort, or fever as directed by your caregiver.   Try a clear liquid diet (broth, tea, or water) for as long as directed by your caregiver. Slowly move to a bland diet as tolerated.  SEEK IMMEDIATE MEDICAL CARE IF:   The pain does not go away.   You have a fever.   You keep throwing up (vomiting).   The pain is felt only in portions of the abdomen. Pain in the right side could possibly be appendicitis. In an adult, pain in the left lower portion of the abdomen could be colitis or diverticulitis.   You pass bloody or black tarry stools.  MAKE SURE YOU:   Understand these instructions.   Will watch your condition.   Will get help right away if you are not doing well or get worse.  Document Released: 07/09/2005 Document Revised: 09/18/2011 Document Reviewed: 05/17/2008 [facility] Patient Information 2012 [place], [place].  Diarrhea Infections caused  by germs (bacterial) or a virus commonly cause diarrhea. Your caregiver has determined that with time, rest and fluids, the diarrhea should improve. In general, eat normally while drinking more water than usual. Although water may prevent dehydration, it does not contain salt and minerals (electrolytes). Broths, weak tea without caffeine and oral rehydration solutions (ORS) replace fluids and electrolytes. Small amounts of fluids should be taken frequently. Large amounts at one time may not be tolerated. Plain water may be harmful in infants and the elderly. Oral rehydrating solutions (ORS) are available at pharmacies and grocery stores. ORS replace water and important electrolytes in proper proportions. Sports drinks are not as effective as ORS and may be harmful due to sugars worsening diarrhea.  ORS is especially recommended for use in children with diarrhea. As a general guideline for children, replace any new fluid losses from diarrhea and/or vomiting with ORS as follows:   If your child weighs 22 pounds or under (10 kg or less), give 60-120 mL ( -  cup or 2 - 4 ounces) of ORS for each episode of diarrheal stool or vomiting episode.   If your child weighs more than 22 pounds (more than 10 kgs), give 120-240 mL ( - 1 cup or 4 - 8 ounces) of ORS for each diarrheal stool or episode of vomiting.   While correcting for dehydration, children should eat normally. However, foods high in sugar should be avoided because this may worsen diarrhea. Large amounts of carbonated soft  drinks, juice, gelatin desserts and other highly sugared drinks should be avoided.   After correction of dehydration, other liquids that are appealing to the child may be added. Children should drink small amounts of fluids frequently and fluids should be increased as tolerated. Children should drink enough fluids to keep urine clear or pale yellow.   Adults should eat normally while drinking more fluids than usual. Drink small  amounts of fluids frequently and increase as tolerated. Drink enough fluids to keep urine clear or pale yellow. Broths, weak decaffeinated tea, lemon lime soft drinks (allowed to go flat) and ORS replace fluids and electrolytes.   Avoid:   Carbonated drinks.   Juice.   Extremely hot or cold fluids.   Caffeine drinks.   Fatty, greasy foods.   Alcohol.   Tobacco.   Too much intake of anything at one time.   Gelatin desserts.   Probiotics are active cultures of beneficial bacteria. They may lessen the amount and number of diarrheal stools in adults. Probiotics can be found in yogurt with active cultures and in supplements.   Wash hands well to avoid spreading bacteria and virus.   Anti-diarrheal medications are not recommended for infants and children.   Only take over-the-counter or prescription medicines for pain, discomfort or fever as directed by your caregiver. Do not give aspirin to children because it may cause Reye's Syndrome.   For adults, ask your caregiver if you should continue all prescribed and over-the-counter medicines.   If your caregiver has given you a follow-up appointment, it is very important to keep that appointment. Not keeping the appointment could result in a chronic or permanent injury, and disability. If there is any problem keeping the appointment, you must call back to this facility for assistance.  SEEK IMMEDIATE MEDICAL CARE IF:   You or your child is unable to keep fluids down or other symptoms or problems become worse in spite of treatment.   Vomiting or diarrhea develops and becomes persistent.   There is vomiting of blood or bile (green material).   There is blood in the stool or the stools are black and tarry.   There is no urine output in 6-8 hours or there is only a small amount of very dark urine.   Abdominal pain develops, increases or localizes.   You have a fever.   Your baby is older than 3 months with a rectal temperature of  102 F (38.9 C) or higher.   Your baby is 31 months old or younger with a rectal temperature of 100.4 F (38 C) or higher.   You or your child develops excessive weakness, dizziness, fainting or extreme thirst.   You or your child develops a rash, stiff neck, severe headache or become irritable or sleepy and difficult to awaken.  MAKE SURE YOU:   Understand these instructions.   Will watch your condition.   Will get help right away if you are not doing well or get worse.  Document Released: 09/19/2002 Document Revised: 09/18/2011 Document Reviewed: 08/06/2009 [facility] Patient Information 2012 [place], [place].

## 2012-01-28 LAB — URINE CULTURE
Colony Count: 70000
Culture  Setup Time: 201304160216

## 2012-01-29 NOTE — ED Provider Notes (Signed)
Medical screening examination/treatment/procedure(s) were performed by non-physician practitioner and as supervising physician I was immediately available for consultation/collaboration.  Margean Korell R. Osher Oettinger, MD 01/29/12 0702 

## 2012-01-29 NOTE — ED Notes (Signed)
+   Urine Chart sent to EDP office for review. 

## 2012-01-31 NOTE — ED Notes (Signed)
Chart returned from EDP office. Prescribed Keflex 500 mg BID x 7 days. Follow-up with PCP. Prescribed by Felicie Morn NPC.

## 2012-02-01 NOTE — ED Notes (Signed)
Called patient and informed them of new RX. RX called to [facility] [phone]). RX called in by Norm Parcel [facility].

## 2013-03-31 ENCOUNTER — Encounter: Payer: Self-pay | Admitting: Physical Medicine & Rehabilitation

## 2013-05-10 ENCOUNTER — Encounter: Payer: Medicare HMO | Attending: Physical Medicine & Rehabilitation | Admitting: Physical Medicine & Rehabilitation

## 2013-05-10 ENCOUNTER — Encounter: Payer: Self-pay | Admitting: Physical Medicine & Rehabilitation

## 2013-05-10 VITALS — BP 156/99 | HR 112 | Resp 14 | Ht 65.0 in | Wt 274.4 lb

## 2013-05-10 DIAGNOSIS — G609 Hereditary and idiopathic neuropathy, unspecified: Secondary | ICD-10-CM | POA: Insufficient documentation

## 2013-05-10 DIAGNOSIS — IMO0001 Reserved for inherently not codable concepts without codable children: Secondary | ICD-10-CM | POA: Insufficient documentation

## 2013-05-10 DIAGNOSIS — F319 Bipolar disorder, unspecified: Secondary | ICD-10-CM | POA: Insufficient documentation

## 2013-05-10 DIAGNOSIS — F329 Major depressive disorder, single episode, unspecified: Secondary | ICD-10-CM

## 2013-05-10 DIAGNOSIS — M67919 Unspecified disorder of synovium and tendon, unspecified shoulder: Secondary | ICD-10-CM | POA: Insufficient documentation

## 2013-05-10 DIAGNOSIS — M719 Bursopathy, unspecified: Secondary | ICD-10-CM | POA: Insufficient documentation

## 2013-05-10 DIAGNOSIS — M19019 Primary osteoarthritis, unspecified shoulder: Secondary | ICD-10-CM | POA: Insufficient documentation

## 2013-05-10 DIAGNOSIS — M170 Bilateral primary osteoarthritis of knee: Secondary | ICD-10-CM | POA: Insufficient documentation

## 2013-05-10 DIAGNOSIS — M47817 Spondylosis without myelopathy or radiculopathy, lumbosacral region: Secondary | ICD-10-CM | POA: Insufficient documentation

## 2013-05-10 DIAGNOSIS — G8929 Other chronic pain: Secondary | ICD-10-CM | POA: Insufficient documentation

## 2013-05-10 DIAGNOSIS — M12812 Other specific arthropathies, not elsewhere classified, left shoulder: Secondary | ICD-10-CM | POA: Insufficient documentation

## 2013-05-10 DIAGNOSIS — G2589 Other specified extrapyramidal and movement disorders: Secondary | ICD-10-CM

## 2013-05-10 DIAGNOSIS — M1710 Unilateral primary osteoarthritis, unspecified knee: Secondary | ICD-10-CM | POA: Insufficient documentation

## 2013-05-10 MED ORDER — GABAPENTIN 300 MG PO CAPS: 300.0000 mg | ORAL_CAPSULE | Freq: Three times a day (TID) | ORAL | Status: DC

## 2013-05-10 NOTE — Progress Notes (Signed)
Subjective:    Patient ID: [name], female    DOB: 1965/10/29, 47 y.o.   MRN: [id number]  HPI  This is an initial evaluation for [name]. She suffers from chronic low back pain, chronic OA of both knees, left shoulder pain due to osteoarthritis, and FMS. She has been followed by [facility] for the last 3 years approximately. She has also been followed by [facility] as well over that time.   She complains of knee pain since 2005 (right) after a fall with subsequent arthroscopic surgery by Dr. Lajoyce Corners. She developed increasing left pain after moving in 2009. She has had several injections including steroids, viscosupplements, etc with varying results. She has been ruled out for TKA's in the near future given her age, weight, and other considerations. Other than medications, she rests her knees when possible. She doesn't do much in the way of exercises. She applies ice on occasion. She did water therapies in the spring which helped, but she has "slacked" off since she moved in May.  As far as her back is concerned, it has bothered her since the 90's. She did a lot of heavy work Therapist, nutritional, restaurant work which seemed to lead to her increased pain. She has received a few ESI's which may have helped but caused her to gain a lot of weight. She has done some water aerobic therapy but not much in the last few years. Imaging of her back is remote at best. She cannot remember the last time an xray was performed.  Her left shoulder has been more a problem of the last 6-12 months. It hurts all the time. She has been told she has "spurs" in the shoulder .she feels as if someone is trying to pull of her shoulder.  Xrays were done over year ago. She has had some TPI's but apparently no intrarticular injection.   She was dx'ed with FMS in 2007 by Dr. Feliciana Rossetti. She has been on gralise, but stopped taking it in June when insurance denied payment. She also takes cymbalta (26yr rx).  She feels that the cymbalta is helpful for pain and her mood.   Her bipolar disorder is managed with depakote as well as cymbalta.   From a pain standpoint, she uses norco 10/325, typically up to 3x per day. She tries to avoid using the medications when she's out or driving particularly.      Pain Inventory Average Pain 8 Pain Right Now 8 My pain is burning, stabbing, tingling and aching  In the last 24 hours, has pain interfered with the following? General activity 8 Relation with others 8 Enjoyment of life 8 What TIME of day is your pain at its worst? morning Sleep (in general) Poor  Pain is worse with: walking, bending and standing Pain improves with: heat/ice and medication Relief from Meds: 7  Mobility walk without assistance how many minutes can you walk? 20 ability to climb steps?  yes do you drive?  yes  Function I need assistance with the following:  household duties and shopping  Neuro/Psych numbness tingling trouble walking  Prior Studies Any changes since last visit?  no  Physicians involved in your care Any changes since last visit?  no   Family History  Problem Relation Age of Onset  . Diabetes Other   . Hypertension Other    History   Social History  . Marital Status: Divorced    Spouse Name: N/A    Number of Children:  N/A  . Years of Education: N/A   Social History Main Topics  . Smoking status: Current Every Day Smoker -- 0.50 packs/day    Types: Cigarettes  . Smokeless tobacco: Never Used  . Alcohol Use: No  . Drug Use: No  . Sexually Active: None   Other Topics Concern  . None   Social History Narrative  . None   Past Surgical History  Procedure Laterality Date  . Abdominal hysterectomy    . Arthroscopic knee     Past Medical History  Diagnosis Date  . Fibromyalgia   . Bipolar 1 disorder   . MI (myocardial infarction)   . Spinal stenosis    BP 156/99  Pulse 112  Resp 14  Ht 5\' 5"  (1.651 m)  Wt 274 lb 6.4 oz  (124.467 kg)  BMI 45.66 kg/m2  SpO2 96%    Review of Systems  Musculoskeletal: Positive for gait problem.       Shoulder pain, knee pain  Neurological: Positive for numbness.       Tingling  All other systems reviewed and are negative.       Objective:   Physical Exam  General: Alert and oriented x 3, No apparent distress. obese HEENT: Head is normocephalic, atraumatic, PERRLA, EOMI, sclera anicteric, oral mucosa pink and moist, dentition intact, ext ear canals clear,  Neck: Supple without JVD or lymphadenopathy Heart: Reg rate and rhythm. No murmurs rubs or gallops Chest: CTA bilaterally without wheezes, rales, or rhonchi; no distress Abdomen: Soft, non-tender, non-distended, bowel sounds positive. Extremities: No clubbing, cyanosis, or edema. Pulses are 2+ Skin: Clean and intact without signs of breakdown. Numerous tattoos noted. Neuro: Pt is cognitively appropriate with normal insight, memory, and awareness. Cranial nerves 2-12 are intact. Sensory exam is normal. Reflexes are 2+ in all 4's except for the achilles which were trace to 1+. Fine motor coordination is intact. No tremors. Motor function is grossly 5/5 except for inhibition at the left shoulder. Musculoskeletal:  Left shoulder notable for pain in all planes, particularly, ER, IR and abduction. She could not abduct the shoulder past 45 degrees before she had substantial pain. The shoulder was also painful along the acromium and coracoid and subacromial areas. Low back was tender to palpation in the lower lumbar spine segments, paritcularly L3-S1. She was able to bend to about 60 degrees and extend to 20 degrees with extension casing the most pain. Rotation to the left also provoked substantial pain moreso than to the right. SLR was positive to equivocal on the left. Standing posture was fair to good. She had generalized tenderness along the left elbow, both greater trochs, traps, sub-occipital areas, etc. i did not palpate any  focal TP's. Neither knee had substantial effusions. Mild crepitus right knee and moderate on the left. She had medial joint line pain right knee and med/lat joint line pain on left. Meniscal maneuvers provoked pain in the medial right knee and med/lat on the left. There was some pain inhibition at times with resisted knee extension also. Antalgia was noted on the left leg with weight bearing.  Marland Kitchen  Psych: Pt's affect is appropriate. Pt is cooperative. She was a little anxious initially, but she settled in nicely once we spent some time together.          Assessment & Plan:  1. Fibromyalgia syndrome 2. Osteoarthritis of bilateral knees 3. Left shoulder OA, RTC syndrome 4. Bipolar syndrome  5. Lumbar spondylosis ?mild radiculopathy on the left.  Plan:  1. After informed consent and preparation of the skin with betadine and isopropyl alcohol, I injected 40mg  of methylprednisolone and 4cc of 1% lidocaine into the left subacromial space via lateral approach. The patient tolerated well, and no complications were encountered. Afterward the area was cleaned and dressed. Post- injection instructions were provided.  I also provided her extensive exercises for her rtc. 2. MRI of left shoulder to assess for RTC injury. She will use 2mg  xanax prior to the procedure to help relax. 3. Resume gabapentin 300mg  TID and titrate as needed. 4. Consider titration of cymbalta. Emphasized the importance of ongoing mgt of her bipolar disorder. 5. UDS was collected today. Will assume rx of pain medication once we confirm its consistency 6. Follow up with me in one month. 45 minutes of face to face patient care time were spent during this visit. All questions were encouraged and answered. It would also be helpful if we could have

## 2013-05-10 NOTE — Patient Instructions (Signed)

## 2013-05-10 NOTE — Addendum Note (Signed)
Addended by: Doreene Eland on: 05/10/2013 02:03 PM   Modules accepted: Orders

## 2013-05-18 ENCOUNTER — Ambulatory Visit (HOSPITAL_COMMUNITY)
Admission: RE | Admit: 2013-05-18 | Discharge: 2013-05-18 | Disposition: A | Discharge status: Home / Self Care | Payer: Medicare HMO | Source: Ambulatory Visit | Attending: Physical Medicine & Rehabilitation | Admitting: Physical Medicine & Rehabilitation

## 2013-05-18 DIAGNOSIS — M19019 Primary osteoarthritis, unspecified shoulder: Secondary | ICD-10-CM | POA: Insufficient documentation

## 2013-05-18 DIAGNOSIS — M12812 Other specific arthropathies, not elsewhere classified, left shoulder: Secondary | ICD-10-CM

## 2013-05-18 DIAGNOSIS — M75100 Unspecified rotator cuff tear or rupture of unspecified shoulder, not specified as traumatic: Secondary | ICD-10-CM | POA: Insufficient documentation

## 2013-05-18 DIAGNOSIS — IMO0002 Reserved for concepts with insufficient information to code with codable children: Secondary | ICD-10-CM | POA: Insufficient documentation

## 2013-05-23 ENCOUNTER — Telehealth: Payer: Self-pay

## 2013-05-23 NOTE — Telephone Encounter (Signed)
Patient request MRI results

## 2013-05-24 NOTE — Telephone Encounter (Signed)
1. Severe osteoarthritis of the glenohumeral joint. Debris in the  joint.  2. Degeneration of the rotator cuff with a small intrasubstance  tear of the distal subscapularis tendon.  Did she have any results with injection?  Can repeat injection again over next couple weeks.

## 2013-05-24 NOTE — Telephone Encounter (Signed)
Patient informed of MRI results.  She did have some relief with the injection.  She will discuss further at her next appointment.

## 2013-06-08 ENCOUNTER — Encounter: Payer: Medicare HMO | Attending: Physical Medicine & Rehabilitation | Admitting: Physical Medicine & Rehabilitation

## 2013-06-08 ENCOUNTER — Encounter: Payer: Self-pay | Admitting: Physical Medicine & Rehabilitation

## 2013-06-08 VITALS — BP 135/86 | HR 91 | Resp 16 | Ht 65.0 in | Wt 273.0 lb

## 2013-06-08 DIAGNOSIS — IMO0002 Reserved for concepts with insufficient information to code with codable children: Secondary | ICD-10-CM

## 2013-06-08 DIAGNOSIS — M19019 Primary osteoarthritis, unspecified shoulder: Secondary | ICD-10-CM

## 2013-06-08 DIAGNOSIS — IMO0001 Reserved for inherently not codable concepts without codable children: Secondary | ICD-10-CM

## 2013-06-08 DIAGNOSIS — M19012 Primary osteoarthritis, left shoulder: Secondary | ICD-10-CM | POA: Insufficient documentation

## 2013-06-08 DIAGNOSIS — M47817 Spondylosis without myelopathy or radiculopathy, lumbosacral region: Secondary | ICD-10-CM

## 2013-06-08 DIAGNOSIS — M170 Bilateral primary osteoarthritis of knee: Secondary | ICD-10-CM

## 2013-06-08 DIAGNOSIS — M12812 Other specific arthropathies, not elsewhere classified, left shoulder: Secondary | ICD-10-CM

## 2013-06-08 DIAGNOSIS — M1710 Unilateral primary osteoarthritis, unspecified knee: Secondary | ICD-10-CM | POA: Insufficient documentation

## 2013-06-08 HISTORY — DX: Primary osteoarthritis, left shoulder: M19.012

## 2013-06-08 MED ORDER — NABUMETONE 500 MG PO TABS: 500.0000 mg | ORAL_TABLET | Freq: Two times a day (BID) | ORAL | Status: DC

## 2013-06-08 MED ORDER — HYDROCODONE-ACETAMINOPHEN 10-325 MG PO TABS: 1.0000 | ORAL_TABLET | Freq: Three times a day (TID) | ORAL | Status: DC | PRN

## 2013-06-08 NOTE — Progress Notes (Signed)
Subjective:    Patient ID: [name], female    DOB: 09-May-1966, 47 y.o.   MRN: [id number]  HPI  [name] is back regarding her multiple pain complaints. She had good results with the steroid injection into her left shoulder although it's "worn off". 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   The MRI of her shoulder revealed:   1. Severe osteoarthritis of the glenohumeral joint. Debris in the  joint.  2. Degeneration of the rotator cuff with a small intrasubstance  tear of the distal subscapularis tendon.   She is complaining of more low back pain especially if she sits or stands for long periods of time. She is afraid it's her SI joint.       Pain Inventory Average Pain 8 Pain Right Now 8 My pain is burning, stabbing, tingling and aching  In the last 24 hours, has pain interfered with the following? General activity 7 Relation with others 7 Enjoyment of life 7 What TIME of day is your pain at its worst? morning and night Sleep (in general) Poor  Pain is worse with: walking, inactivity and standing Pain improves with: rest and medication Relief from Meds: 7  Mobility walk with assistance how many minutes can you walk? 20 ability to climb steps?  no do you drive?  yes Do you have any goals in this area?  yes  Function disabled: date disabled 2001 I need assistance with the following:  bathing and household duties  Neuro/Psych tingling trouble walking spasms anxiety  Prior Studies Any changes since last visit?  yes  Physicians involved in your care Any changes since last visit?  no   Family History  Problem Relation Age of Onset  . Diabetes Other   . Hypertension Other    History   Social History  . Marital Status: Divorced    Spouse Name: N/A    Number of Children: N/A  . Years of Education: N/A   Social History Main Topics  . Smoking status: Current Every Day Smoker -- 0.50 packs/day   Types: Cigarettes  . Smokeless tobacco: Never Used  . Alcohol Use: No  . Drug Use: No  . Sexual Activity: None   Other Topics Concern  . None   Social History Narrative  . None   Past Surgical History  Procedure Laterality Date  . Abdominal hysterectomy    . Arthroscopic knee     Past Medical History  Diagnosis Date  . Fibromyalgia   . Bipolar 1 disorder   . MI (myocardial infarction)   . Spinal stenosis    BP 135/86  Pulse 91  Resp 16  Ht 5\' 5"  (1.651 m)  Wt 273 lb (123.832 kg)  BMI 45.43 kg/m2  SpO2 95%     Review of Systems  HENT: Positive for neck pain.   Musculoskeletal: Positive for back pain and gait problem.  Psychiatric/Behavioral: The patient is nervous/anxious.   All other systems reviewed and are negative.       Objective:   Physical Exam General: Alert and oriented x 3, No apparent distress. obese  HEENT: Head is normocephalic, atraumatic, PERRLA, EOMI, sclera anicteric, oral mucosa pink and moist, dentition intact, ext ear canals clear,  Neck: Supple without JVD or lymphadenopathy  Heart: Reg rate and rhythm. No murmurs rubs or gallops  Chest: CTA bilaterally without wheezes, rales, or rhonchi; no distress  Abdomen: Soft, non-tender, non-distended, bowel sounds positive.  Extremities: No clubbing, cyanosis, or edema. Pulses are 2+  Skin: Clean and intact without signs of breakdown. Numerous tattoos noted.  Neuro: Pt is cognitively appropriate with normal insight, memory, and awareness. Cranial nerves 2-12 are intact. Sensory exam is normal. Reflexes are 2+ in all 4's except for the achilles which were trace to 1+. Fine motor coordination is intact. No tremors. Motor function is grossly 5/5 except for inhibition at the left shoulder.  Musculoskeletal: Left  pain in all planes, particularly, ER, IR and abduction. She could not abduct the shoulder past 45 degrees before she had substantial pain.   Low back was tender to palpation in the lower lumbar  spine segments, paritcularly L3-S1. She was able to bend to about 45 degrees and extend to 20 degrees with extension causes more pain. Rotation to the left also provoked substantial pain moreso than to the right. SLR was positive to equivocal on the left. Standing posture was fair to good. She had generalized tenderness along the left elbow, both greater trochs, traps, sub-occipital areas, etc. i did not palpate any focal TP's. Neither knee had substantial effusions. Mild crepitus right knee and moderate on the left. She had medial joint line pain right knee and med/lat joint line pain on left. Meniscal maneuvers provoked pain in the medial right knee and med/lat on the left. There was some pain inhibition at times with resisted knee extension also.   Psych: Pt's affect is appropriate. Pt is cooperative. She was a little anxious initially, but she settled in nicely once we spent some time together.   Assessment & Plan:   1. Fibromyalgia syndrome  2. Osteoarthritis of bilateral knees  3. 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 Bipolar syndrome  5. Lumbar spondylosis ?facet arthropathy   Plan:  1. She may need further surgical opinion regarding her shoulder. For now I iniated a trial of relafen 500mg  bid. Consider follow up injection as well. 2. XRays of lumbar spine.  3. continue  gabapentin 300mg  TID and titrate as needed. This has helped generalize pain.  4. Ongoing mgt of bipolar disorder per psych 5. Hydrocodone for breakthrough pain was RF #75 6. Follow up with me in 2 months. 45 minutes of face to face patient care time were spent during this visit. All questions were encouraged and answered. I

## 2013-06-08 NOTE — Patient Instructions (Signed)
CALL ME WITH ANY PROBLEMS OR QUESTIONS (#[phone]).  HAVE A GOOD DAY  

## 2013-07-14 ENCOUNTER — Other Ambulatory Visit: Payer: Self-pay | Admitting: Physical Medicine & Rehabilitation

## 2013-08-03 ENCOUNTER — Encounter: Payer: Medicare HMO | Attending: Physical Medicine & Rehabilitation | Admitting: Physical Medicine & Rehabilitation

## 2013-08-03 ENCOUNTER — Encounter: Payer: Self-pay | Admitting: Physical Medicine & Rehabilitation

## 2013-08-03 VITALS — BP 137/83 | HR 86 | Resp 14 | Ht 65.0 in | Wt 269.0 lb

## 2013-08-03 DIAGNOSIS — G4700 Insomnia, unspecified: Secondary | ICD-10-CM | POA: Insufficient documentation

## 2013-08-03 DIAGNOSIS — M1710 Unilateral primary osteoarthritis, unspecified knee: Secondary | ICD-10-CM

## 2013-08-03 DIAGNOSIS — M170 Bilateral primary osteoarthritis of knee: Secondary | ICD-10-CM

## 2013-08-03 DIAGNOSIS — M47817 Spondylosis without myelopathy or radiculopathy, lumbosacral region: Secondary | ICD-10-CM | POA: Insufficient documentation

## 2013-08-03 DIAGNOSIS — F319 Bipolar disorder, unspecified: Secondary | ICD-10-CM | POA: Insufficient documentation

## 2013-08-03 DIAGNOSIS — G609 Hereditary and idiopathic neuropathy, unspecified: Secondary | ICD-10-CM

## 2013-08-03 DIAGNOSIS — M12812 Other specific arthropathies, not elsewhere classified, left shoulder: Secondary | ICD-10-CM

## 2013-08-03 DIAGNOSIS — IMO0001 Reserved for inherently not codable concepts without codable children: Secondary | ICD-10-CM | POA: Insufficient documentation

## 2013-08-03 DIAGNOSIS — M19019 Primary osteoarthritis, unspecified shoulder: Secondary | ICD-10-CM | POA: Insufficient documentation

## 2013-08-03 DIAGNOSIS — M19012 Primary osteoarthritis, left shoulder: Secondary | ICD-10-CM

## 2013-08-03 DIAGNOSIS — IMO0002 Reserved for concepts with insufficient information to code with codable children: Secondary | ICD-10-CM | POA: Insufficient documentation

## 2013-08-03 MED ORDER — HYDROCODONE-ACETAMINOPHEN 10-325 MG PO TABS: 1.0000 | ORAL_TABLET | Freq: Three times a day (TID) | ORAL | Status: DC | PRN

## 2013-08-03 MED ORDER — MELOXICAM 15 MG PO TABS: 15.0000 mg | ORAL_TABLET | Freq: Every day | ORAL | Status: DC

## 2013-08-03 MED ORDER — TRAZODONE HCL 50 MG PO TABS: 50.0000 mg | ORAL_TABLET | Freq: Every day | ORAL | Status: DC

## 2013-08-03 MED ORDER — DULOXETINE HCL 60 MG PO CPEP: 60.0000 mg | ORAL_CAPSULE | Freq: Every day | ORAL | Status: DC

## 2013-08-03 NOTE — Progress Notes (Signed)
Subjective:    Patient ID: [name], female    DOB: 1966-01-04, 47 y.o.   MRN: [id number]  HPI  [name] is back regarding her chronic pain. The gabapentin is making her sweat.   Her insurance has denied coverage of her her cymbalta and has asked that she get prior authorization for the rx.   She feels that the relafen helps her pain a little bit but it causes swelling.   She banged her right wrist against the edge of the cabinet last week and it has caused substantial pain at the elbow and wrist.   [name]'s feet have been bothering her a lot more since she's been off the cymbalta.   Pain Inventory Average Pain 9 Pain Right Now 9 My pain is burning, stabbing, tingling and aching  In the last 24 hours, has pain interfered with the following? General activity 9 Relation with others 7 Enjoyment of life 2 What TIME of day is your pain at its worst? morning and evening Sleep (in general) Poor  Pain is worse with: walking, standing and some activites Pain improves with: rest and medication Relief from Meds: 6  Mobility walk with assistance how many minutes can you walk? 15 ability to climb steps?  no do you drive?  yes needs help with transfers Do you have any goals in this area?  yes  Function disabled: date disabled . I need assistance with the following:  household duties and shopping  Neuro/Psych numbness tingling trouble walking  Prior Studies Any changes since last visit?  no  Physicians involved in your care Any changes since last visit?  no   Family History  Problem Relation Age of Onset  . Diabetes Other   . Hypertension Other   . Schizophrenia Mother   . Hypertension Mother   . Stroke Father    History   Social History  . Marital Status: Divorced    Spouse Name: N/A    Number of Children: N/A  . Years of Education: N/A   Social History Main Topics  . Smoking status: Current Every Day Smoker -- 0.50 packs/day    Types: Cigarettes  .  Smokeless tobacco: Never Used  . Alcohol Use: No  . Drug Use: No  . Sexual Activity: None   Other Topics Concern  . None   Social History Narrative  . None   Past Surgical History  Procedure Laterality Date  . Abdominal hysterectomy    . Arthroscopic knee     Past Medical History  Diagnosis Date  . Fibromyalgia   . Bipolar 1 disorder   . MI (myocardial infarction)   . Spinal stenosis    BP 137/83  Pulse 86  Resp 14  Ht 5\' 5"  (1.651 m)  Wt 269 lb (122.018 kg)  BMI 44.76 kg/m2  SpO2 98%     Review of Systems  Constitutional: Positive for diaphoresis.  Respiratory: Positive for apnea and shortness of breath.   Musculoskeletal: Positive for back pain and gait problem.  Neurological: Positive for numbness.  All other systems reviewed and are negative.       Objective:   Physical Exam General: Alert and oriented x 3, No apparent distress. obese  HEENT: Head is normocephalic, atraumatic, PERRLA, EOMI, sclera anicteric, oral mucosa pink and moist, dentition intact, ext ear canals clear,  Neck: Supple without JVD or lymphadenopathy  Heart: Reg rate and rhythm. No murmurs rubs or gallops  Chest: CTA bilaterally without wheezes, rales, or rhonchi; no distress  Abdomen: Soft, non-tender, non-distended, bowel sounds positive.  Extremities: No clubbing, cyanosis, or edema. Pulses are 2+  Skin: Clean and intact without signs of breakdown. Numerous tattoos noted.  Neuro: Pt is cognitively appropriate with normal insight, memory, and awareness. Cranial nerves 2-12 are intact. Sensory exam is normal. Reflexes are 2+ in all 4's except for the achilles which were trace to 1+. Fine motor coordination is intact. No tremors. Motor function is grossly 5/5 except for inhibition at the left shoulder.  Musculoskeletal: Left shoulder pain with ER, IR and abduction. She could not abduct the shoulder past 45 degrees before she had substantial pain. Low back was tender to palpation in the  lower lumbar spine segments, paritcularly L3-S1. She was able to bend to about 45 degrees and extend to 20 degrees with extension causes more pain. Rotation to the left also provoked substantial pain moreso than to the right. SLR was positive to equivocal on the left. Standing posture was fair to good. She had generalized tenderness along the left elbow, both greater trochs, traps, sub-occipital areas, Neither knee had substantial effusions. Mild crepitus right knee and moderate on the left. She had medial joint line pain right knee and med/lat joint line pain on left. Meniscal maneuvers provoked pain in the medial right knee and med/lat on the left. There was some pain inhibition at times with resisted knee extension also.  Psych:Affect was anxious and on edge at times, but she was generally pleasant  Assessment & Plan:   1. Fibromyalgia syndrome  2. Osteoarthritis of bilateral knees  3. 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 Bipolar syndrome  5. Lumbar spondylosis ?facet arthropathy   Plan:  1. Due to swelling will change her relafen to meloxicam  2. Continue wrist spints, ice bilaterally. Her right wrist should improve with time.   3. Continue gabapentin 300mg  TID despite the sweating, given the benefit she's getting  4. Will contact her insurance regarding the cymbalta rx. She needs this medication for multiple needs including her neuropathic pain, FMS, and bipolar syndrome.  5. Hydrocodone for breakthrough pain was RF #80  6. Add trazodone for sleep.  6. Follow up with me in 2 months. 30 minutes of face to face patient care time were spent during this visit. All questions were encouraged and answered. I           Assessment & Plan:  Patient aware of new hydrocodone office policy.  Letter given.

## 2013-08-03 NOTE — Patient Instructions (Signed)
CONTINUE TO WORK ON YOUR STRESS AND MOOD MANAGEMENT!!!

## 2013-09-28 ENCOUNTER — Encounter: Payer: Medicare HMO | Attending: Physical Medicine & Rehabilitation | Admitting: Physical Medicine & Rehabilitation

## 2013-09-28 DIAGNOSIS — M19019 Primary osteoarthritis, unspecified shoulder: Secondary | ICD-10-CM | POA: Insufficient documentation

## 2013-09-28 DIAGNOSIS — IMO0001 Reserved for inherently not codable concepts without codable children: Secondary | ICD-10-CM | POA: Insufficient documentation

## 2013-09-28 DIAGNOSIS — IMO0002 Reserved for concepts with insufficient information to code with codable children: Secondary | ICD-10-CM | POA: Insufficient documentation

## 2013-09-28 DIAGNOSIS — M47817 Spondylosis without myelopathy or radiculopathy, lumbosacral region: Secondary | ICD-10-CM | POA: Insufficient documentation

## 2013-09-28 DIAGNOSIS — G4700 Insomnia, unspecified: Secondary | ICD-10-CM | POA: Insufficient documentation

## 2013-09-28 DIAGNOSIS — M1710 Unilateral primary osteoarthritis, unspecified knee: Secondary | ICD-10-CM | POA: Insufficient documentation

## 2013-09-28 DIAGNOSIS — G2589 Other specified extrapyramidal and movement disorders: Secondary | ICD-10-CM | POA: Insufficient documentation

## 2013-10-04 ENCOUNTER — Encounter: Payer: Self-pay | Admitting: Physical Medicine & Rehabilitation

## 2013-10-04 ENCOUNTER — Encounter (HOSPITAL_BASED_OUTPATIENT_CLINIC_OR_DEPARTMENT_OTHER): Payer: Medicare HMO | Admitting: Physical Medicine & Rehabilitation

## 2013-10-04 VITALS — BP 151/79 | HR 88 | Resp 14 | Ht 65.0 in | Wt 259.0 lb

## 2013-10-04 DIAGNOSIS — IMO0001 Reserved for inherently not codable concepts without codable children: Secondary | ICD-10-CM

## 2013-10-04 DIAGNOSIS — G4700 Insomnia, unspecified: Secondary | ICD-10-CM

## 2013-10-04 DIAGNOSIS — M12812 Other specific arthropathies, not elsewhere classified, left shoulder: Secondary | ICD-10-CM

## 2013-10-04 DIAGNOSIS — M19012 Primary osteoarthritis, left shoulder: Secondary | ICD-10-CM

## 2013-10-04 DIAGNOSIS — M19019 Primary osteoarthritis, unspecified shoulder: Secondary | ICD-10-CM

## 2013-10-04 DIAGNOSIS — G609 Hereditary and idiopathic neuropathy, unspecified: Secondary | ICD-10-CM

## 2013-10-04 DIAGNOSIS — M170 Bilateral primary osteoarthritis of knee: Secondary | ICD-10-CM

## 2013-10-04 DIAGNOSIS — G2589 Other specified extrapyramidal and movement disorders: Secondary | ICD-10-CM

## 2013-10-04 DIAGNOSIS — M1710 Unilateral primary osteoarthritis, unspecified knee: Secondary | ICD-10-CM

## 2013-10-04 DIAGNOSIS — M47817 Spondylosis without myelopathy or radiculopathy, lumbosacral region: Secondary | ICD-10-CM

## 2013-10-04 MED ORDER — CYCLOBENZAPRINE HCL 10 MG PO TABS: 10.0000 mg | ORAL_TABLET | Freq: Every day | ORAL | Status: DC

## 2013-10-04 MED ORDER — HYDROCODONE-ACETAMINOPHEN 10-325 MG PO TABS: 1.0000 | ORAL_TABLET | Freq: Three times a day (TID) | ORAL | Status: DC | PRN

## 2013-10-04 NOTE — Patient Instructions (Signed)
CALL ME WITH ANY PROBLEMS OR QUESTIONS (#[phone]).    HAPPY HOLIDAYS!!!!!   

## 2013-10-04 NOTE — Progress Notes (Signed)
Subjective:    Patient ID: [name], female    DOB: 11-Nov-1965, 47 y.o.   MRN: [id number]  HPI  [name] is back regarding her chronic pain and FMS.  She has been doing a little better with the gabapentin and cymbalta which she was finally able to fill. They have helped her mood and generalized pain. Her main issue today is her left shoulder which is really bothering her with household activities. She is also having more tingling in her hands. Her sleep has been affected by the shoulder and hands as well. The trazodone doesn't seem to really work.   Pain Inventory Average Pain 8 Pain Right Now 7 My pain is burning, stabbing and aching  In the last 24 hours, has pain interfered with the following? General activity 5 Relation with others 5 Enjoyment of life 6 What TIME of day is your pain at its worst? n/a Sleep (in general) Fair  Pain is worse with: walking, sitting, standing and some activites Pain improves with: rest and medication Relief from Meds: 4  Mobility walk without assistance Do you have any goals in this area?  no  Function Do you have any goals in this area?  no  Neuro/Psych numbness tingling trouble walking spasms  Prior Studies Any changes since last visit?  no  Physicians involved in your care Any changes since last visit?  no   Family History  Problem Relation Age of Onset  . Diabetes Other   . Hypertension Other   . Schizophrenia Mother   . Hypertension Mother   . Stroke Father    History   Social History  . Marital Status: Divorced    Spouse Name: N/A    Number of Children: N/A  . Years of Education: N/A   Social History Main Topics  . Smoking status: Current Every Day Smoker -- 0.50 packs/day    Types: Cigarettes  . Smokeless tobacco: Never Used  . Alcohol Use: No  . Drug Use: No  . Sexual Activity: None   Other Topics Concern  . None   Social History Narrative  . None   Past Surgical History  Procedure  Laterality Date  . Abdominal hysterectomy    . Arthroscopic knee     Past Medical History  Diagnosis Date  . Fibromyalgia   . Bipolar 1 disorder   . MI (myocardial infarction)   . Spinal stenosis    BP 151/79  Pulse 88  Resp 14  Ht 5\' 5"  (1.651 m)  Wt 259 lb (117.482 kg)  BMI 43.10 kg/m2  SpO2 95%     Review of Systems  Constitutional: Positive for unexpected weight change.  Musculoskeletal: Positive for back pain, gait problem and neck pain.  Neurological: Positive for numbness.  All other systems reviewed and are negative.       Objective:   Physical Exam  General: Alert and oriented x 3, No apparent distress. obese  HEENT: Head is normocephalic, atraumatic, PERRLA, EOMI, sclera anicteric, oral mucosa pink and moist, dentition intact, ext ear canals clear,  Neck: Supple without JVD or lymphadenopathy  Heart: Reg rate and rhythm. No murmurs rubs or gallops  Chest: CTA bilaterally without wheezes, rales, or rhonchi; no distress  Abdomen: Soft, non-tender, non-distended, bowel sounds positive.  Extremities: No clubbing, cyanosis, or edema. Pulses are 2+  Skin: Clean and intact without signs of breakdown. Numerous tattoos noted.  Neuro: Pt is cognitively appropriate with normal insight, memory, and awareness. Cranial nerves 2-12  are intact. Sensory exam is normal. Reflexes are 2+ in all 4's except for the achilles which were trace to 1+. Fine motor coordination is intact. No tremors. Motor function is grossly 5/5 except for inhibition at the left shoulder.  Musculoskeletal: Left shoulder pain with ER, IR and abduction. She could not abduct the shoulder past 45 degrees before she had substantial pain. Low back was tender to palpation in the lower lumbar spine segments, paritcularly L3-S1. She was able to bend to about 45 degrees and extend to 20 degrees with extension causes more pain. Rotation to the left also provoked substantial pain moreso than to the right. SLR was  positive to equivocal on the left. Standing posture was fair to good. She had generalized tenderness along the left elbow, both greater trochs, traps, sub-occipital areas,  . Mild crepitus right knee and moderate on the left. She had medial joint line pain right knee and med/lat joint line pain on left. Meniscal maneuvers provoked pain in the medial right knee and med/lat on the left. There was some pain inhibition at times with resisted knee extension also.  Psych:Affect was anxious at times but generally pleasant and appropriate.  Assessment & Plan:   1. Fibromyalgia syndrome  2. Osteoarthritis of bilateral knees  3. 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 Bipolar syndrome  5. Lumbar spondylosis ?facet arthropathy 6. Insomnia    Plan:  1. Recommended Mg++ along with Ca+ and K+ supplementation for muscle cramps.  Discussed dietary modification as well, particularly reducing caffeine and sugar intake.  2. Continue wrist spints, ice bilaterally. Her right wrist should improve with time. If she wants to pursue more aggressive care for her wrists, we probably need to do NCS before a referral is made 3. Continue gabapentin 300mg  TID despite the sweating, given the benefit she's getting  4. Cymbalta---continue for mood and pain effects. I believe this has helped 5. Hydrocodone for breakthrough pain was RF #80  6. After informed consent and preparation of the skin with betadine and isopropyl alcohol, I injected 6mg  (1cc) of celestone and 4cc of 1% lidocaine into the left subacromial space via lateral approach. Additionally, aspiration was performed prior to injection. The patient tolerated well, and no complications were encountered. Afterward the area was cleaned and dressed. Post- injection instructions were provided.  7. Add flexeril for muscle spasms and sleep. DC trazodone. 8. Follow up with me in 2 months. 30 minutes of face to face patient care time  were spent during this visit. All questions were encouraged and answered. I

## 2013-11-08 ENCOUNTER — Telehealth: Payer: Self-pay

## 2013-11-08 DIAGNOSIS — M12812 Other specific arthropathies, not elsewhere classified, left shoulder: Secondary | ICD-10-CM

## 2013-11-08 DIAGNOSIS — M19012 Primary osteoarthritis, left shoulder: Secondary | ICD-10-CM

## 2013-11-08 DIAGNOSIS — M47817 Spondylosis without myelopathy or radiculopathy, lumbosacral region: Secondary | ICD-10-CM

## 2013-11-08 DIAGNOSIS — IMO0001 Reserved for inherently not codable concepts without codable children: Secondary | ICD-10-CM

## 2013-11-08 DIAGNOSIS — M75102 Unspecified rotator cuff tear or rupture of left shoulder, not specified as traumatic: Secondary | ICD-10-CM

## 2013-11-08 DIAGNOSIS — M170 Bilateral primary osteoarthritis of knee: Secondary | ICD-10-CM

## 2013-11-08 MED ORDER — HYDROCODONE-ACETAMINOPHEN 10-325 MG PO TABS: 1.0000 | ORAL_TABLET | Freq: Three times a day (TID) | ORAL | Status: DC | PRN

## 2013-11-08 NOTE — Telephone Encounter (Signed)
Patient aware she can come to the office to pick up hydrocodone.

## 2013-11-08 NOTE — Telephone Encounter (Signed)
Patient called requesting norco refill.  Hydrocodone printed to be signed.  Will call patient when ready.

## 2013-11-29 ENCOUNTER — Encounter: Payer: Medicare HMO | Admitting: Physical Medicine & Rehabilitation

## 2013-11-30 ENCOUNTER — Encounter: Payer: Medicare HMO | Admitting: Physical Medicine & Rehabilitation

## 2013-12-01 ENCOUNTER — Encounter: Payer: Medicare HMO | Attending: Physical Medicine and Rehabilitation | Admitting: Physical Medicine and Rehabilitation

## 2013-12-01 ENCOUNTER — Encounter: Payer: Self-pay | Admitting: Physical Medicine and Rehabilitation

## 2013-12-01 VITALS — BP 162/93 | HR 86 | Resp 14 | Ht 63.0 in | Wt 261.0 lb

## 2013-12-01 DIAGNOSIS — M12812 Other specific arthropathies, not elsewhere classified, left shoulder: Secondary | ICD-10-CM

## 2013-12-01 DIAGNOSIS — IMO0002 Reserved for concepts with insufficient information to code with codable children: Secondary | ICD-10-CM

## 2013-12-01 DIAGNOSIS — M19012 Primary osteoarthritis, left shoulder: Secondary | ICD-10-CM

## 2013-12-01 DIAGNOSIS — F319 Bipolar disorder, unspecified: Secondary | ICD-10-CM | POA: Insufficient documentation

## 2013-12-01 DIAGNOSIS — M19019 Primary osteoarthritis, unspecified shoulder: Secondary | ICD-10-CM | POA: Insufficient documentation

## 2013-12-01 DIAGNOSIS — I10 Essential (primary) hypertension: Secondary | ICD-10-CM | POA: Insufficient documentation

## 2013-12-01 DIAGNOSIS — F17200 Nicotine dependence, unspecified, uncomplicated: Secondary | ICD-10-CM | POA: Insufficient documentation

## 2013-12-01 DIAGNOSIS — G4700 Insomnia, unspecified: Secondary | ICD-10-CM

## 2013-12-01 DIAGNOSIS — M47817 Spondylosis without myelopathy or radiculopathy, lumbosacral region: Secondary | ICD-10-CM

## 2013-12-01 DIAGNOSIS — M75102 Unspecified rotator cuff tear or rupture of left shoulder, not specified as traumatic: Secondary | ICD-10-CM

## 2013-12-01 DIAGNOSIS — G8929 Other chronic pain: Secondary | ICD-10-CM | POA: Insufficient documentation

## 2013-12-01 DIAGNOSIS — M1710 Unilateral primary osteoarthritis, unspecified knee: Secondary | ICD-10-CM | POA: Insufficient documentation

## 2013-12-01 DIAGNOSIS — IMO0001 Reserved for inherently not codable concepts without codable children: Secondary | ICD-10-CM | POA: Insufficient documentation

## 2013-12-01 DIAGNOSIS — G2589 Other specified extrapyramidal and movement disorders: Secondary | ICD-10-CM

## 2013-12-01 DIAGNOSIS — M170 Bilateral primary osteoarthritis of knee: Secondary | ICD-10-CM

## 2013-12-01 MED ORDER — LIDOCAINE 5 % EX PTCH: 1.0000 | MEDICATED_PATCH | CUTANEOUS | Status: DC

## 2013-12-01 MED ORDER — CYCLOBENZAPRINE HCL 10 MG PO TABS: 10.0000 mg | ORAL_TABLET | Freq: Every day | ORAL | Status: DC

## 2013-12-01 MED ORDER — HYDROCODONE-ACETAMINOPHEN 10-325 MG PO TABS: 1.0000 | ORAL_TABLET | Freq: Three times a day (TID) | ORAL | Status: DC | PRN

## 2013-12-01 MED ORDER — MELOXICAM 15 MG PO TABS: 15.0000 mg | ORAL_TABLET | Freq: Every day | ORAL | Status: DC

## 2013-12-01 NOTE — Patient Instructions (Signed)
Call Dr. Tresa Moore (tomorrow or Monday) for recheck of blood pressure as well as medication for mood stabilization.  Recheck blood pressure at drug store today and for next few days.

## 2013-12-01 NOTE — Progress Notes (Signed)
Subjective:    Patient ID: Marlene Beidler, female    DOB: [birth year], 48 y.o.   MRN: [id number]  HPI Mrs. Kaufmann is back for follow up on her chronic pain and FMS. Reports that left shoulder is bother her with household activities and her hobby. She feels that her mood is getting out of control due to multiple psychosocial issues.  She is concerned about finances as well as her health insurance. She has been using gabapentin and cymbalta for neuropathic pain.  She has had to cut back on her hobbies to help decrease left shoulder irritation.   She rates her pain as 7/10 on average with pain relief 4/10 with pain  Medications and interventions.    Pain Inventory Average Pain 7 Pain Right Now 8 My pain is burning, stabbing and aching  In the last 24 hours, has pain interfered with the following? General activity 3 Relation with others 7 Enjoyment of life 4 What TIME of day is your pain at its worst? evening Sleep (in general) Poor  Pain is worse with: walking, bending and standing Pain improves with: rest, heat/ice and medication Relief from Meds: 4  Mobility walk without assistance ability to climb steps?  no do you drive?  yes transfers alone  Function disabled: date disabled na  Neuro/Psych numbness tingling trouble walking spasms  Prior Studies Any changes since last visit?  no  Physicians involved in your care Any changes since last visit?  no   Family History  Problem Relation Age of Onset  . Diabetes Other   . Hypertension Other   . Schizophrenia Mother   . Hypertension Mother   . Stroke Father    History   Social History  . Marital Status: Divorced    Spouse Name: N/A    Number of Children: N/A  . Years of Education: N/A   Social History Main Topics  . Smoking status: Current Every Day Smoker -- 0.50 packs/day    Types: Cigarettes  . Smokeless tobacco: Never Used  . Alcohol Use: No  . Drug Use: No  . Sexual Activity: None   Other Topics  Concern  . None   Social History Narrative  . None   Past Surgical History  Procedure Laterality Date  . Abdominal hysterectomy    . Arthroscopic knee     Past Medical History  Diagnosis Date  . Fibromyalgia   . Bipolar 1 disorder   . MI (myocardial infarction)   . Spinal stenosis    BP 162/93  Pulse 86  Resp 14  Ht 5\' 3"  (1.6 m)  Wt 261 lb (118.389 kg)  BMI 46.25 kg/m2  SpO2 97%  Opioid Risk Score:   Fall Risk Score: Moderate Fall Risk (6-13 points) (pt educated on fall risk, brochure given to pt.)    Review of Systems  Musculoskeletal: Positive for back pain, gait problem and neck pain.  Neurological: Positive for numbness.       Tingling, spasms  All other systems reviewed and are negative.       Objective:   Physical Exam  Nursing note and vitals reviewed. Constitutional: She is oriented to person, place, and time. She appears well-developed and well-nourished.  HENT:  Head: Normocephalic and atraumatic.  Eyes: Conjunctivae are normal. Pupils are equal, round, and reactive to light.  Cardiovascular: Normal rate and regular rhythm.   Pulmonary/Chest: Effort normal and breath sounds normal.  Abdominal: Soft.  Musculoskeletal:  Left shoulder pain in all planes, particularly, ER,  IR and abduction. She could not abduct the shoulder past 45 degrees before she had substantial pain.   Low back was tender to palpation in the lower lumbar spine segments, paritcularly L3-S1 .  She has mild crepitus right knee and moderate on the left. She had medial joint line pain right knee and med/lat joint line pain on left.  There was some pain inhibition at times with resisted knee extension   Neurological: She is alert and oriented to person, place, and time.  Skin: Skin is warm and dry.  Psychiatric: Her speech is normal. Thought content normal. Her mood appears anxious. Cognition and memory are normal.  Initial anxiety improved with exam.  Verbose.          Assessment  & Plan:  1. Fibromyalgia syndrome :  Has been taking supplements but complaining of constipation. Still having some cramps. Advised her to increase magnesium to  Twice a day to help with both symptoms. Continue cymbalta and neurontin.   2. Osteoarthritis of bilateral knees:  Continue hydrocodone prn. Also uses OTC analgesic patches.  Refilled:  Norco 10/325 mg # 80 pills--use every 8 hours as needed for pain.     Flexeril 10 mg # 60 pills---use 1-2 pills at bedtime.  3. 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  She continues to use norco prn for this. She is trying to find orthopedist who accepts [facility] so that she can explore surgical options.   4. Chronic Insomnia: Has sleep disruption due to boyfriend's schedule as well as anxiety issues. She can't sleep till someone is home and usually sleeps from 6 am to noon daily. She plans on asking her PMD to increase her xanax.   5. HTN:  She reports that it has been this high at times. She is under a lot of stress due to multiple family issues.  She is to have it rechecked by primary MD tomorrow or on Monday.  Also advised her to recheck it at drug store today  6.Bipolar disorder:  Has been under a lot of stress--- >mother with dementia, daughter with high risk pregnancy, boyfriend with DUI who'll need to do some weekends (afraid of being alone). She feels that she needs something more and plans on following up with Dr. Tresa Moore on Monday.

## 2013-12-15 ENCOUNTER — Telehealth: Payer: Self-pay

## 2013-12-15 NOTE — Telephone Encounter (Signed)
Lidocaine patch has been denied by insurance.  They will cover lidocaine ointment.  Would you like to change this?

## 2013-12-15 NOTE — Telephone Encounter (Signed)
Yes that's fine----apply qid----#1 month supply with 4RF

## 2013-12-16 MED ORDER — LIDOCAINE 5 % EX OINT: 1.0000 "application " | TOPICAL_OINTMENT | Freq: Four times a day (QID) | CUTANEOUS | Status: DC | PRN

## 2013-12-16 NOTE — Telephone Encounter (Signed)
Lidocaine ointment escribed to pharmacy.

## 2013-12-21 ENCOUNTER — Other Ambulatory Visit: Payer: Self-pay | Admitting: *Deleted

## 2013-12-21 DIAGNOSIS — M170 Bilateral primary osteoarthritis of knee: Secondary | ICD-10-CM

## 2013-12-21 DIAGNOSIS — M19012 Primary osteoarthritis, left shoulder: Secondary | ICD-10-CM

## 2013-12-21 DIAGNOSIS — M75102 Unspecified rotator cuff tear or rupture of left shoulder, not specified as traumatic: Secondary | ICD-10-CM

## 2013-12-21 DIAGNOSIS — M47817 Spondylosis without myelopathy or radiculopathy, lumbosacral region: Secondary | ICD-10-CM

## 2013-12-21 DIAGNOSIS — IMO0001 Reserved for inherently not codable concepts without codable children: Secondary | ICD-10-CM

## 2013-12-21 DIAGNOSIS — M12812 Other specific arthropathies, not elsewhere classified, left shoulder: Secondary | ICD-10-CM

## 2013-12-21 MED ORDER — HYDROCODONE-ACETAMINOPHEN 10-325 MG PO TABS: 1.0000 | ORAL_TABLET | Freq: Three times a day (TID) | ORAL | Status: DC | PRN

## 2013-12-21 NOTE — Telephone Encounter (Signed)
RX printed for MD to sign for RN visit 12/23/13 

## 2013-12-23 ENCOUNTER — Encounter: Payer: Self-pay | Admitting: *Deleted

## 2013-12-23 ENCOUNTER — Encounter: Payer: Medicare HMO | Attending: Physical Medicine & Rehabilitation | Admitting: *Deleted

## 2013-12-23 VITALS — BP 147/80 | HR 87 | Resp 14

## 2013-12-23 DIAGNOSIS — Z79899 Other long term (current) drug therapy: Secondary | ICD-10-CM

## 2013-12-23 DIAGNOSIS — M12812 Other specific arthropathies, not elsewhere classified, left shoulder: Secondary | ICD-10-CM

## 2013-12-23 DIAGNOSIS — G8929 Other chronic pain: Secondary | ICD-10-CM | POA: Insufficient documentation

## 2013-12-23 DIAGNOSIS — M19019 Primary osteoarthritis, unspecified shoulder: Secondary | ICD-10-CM | POA: Insufficient documentation

## 2013-12-23 DIAGNOSIS — M47817 Spondylosis without myelopathy or radiculopathy, lumbosacral region: Secondary | ICD-10-CM | POA: Insufficient documentation

## 2013-12-23 DIAGNOSIS — M170 Bilateral primary osteoarthritis of knee: Secondary | ICD-10-CM

## 2013-12-23 DIAGNOSIS — M75102 Unspecified rotator cuff tear or rupture of left shoulder, not specified as traumatic: Secondary | ICD-10-CM

## 2013-12-23 DIAGNOSIS — Z5181 Encounter for therapeutic drug level monitoring: Secondary | ICD-10-CM

## 2013-12-23 DIAGNOSIS — IMO0001 Reserved for inherently not codable concepts without codable children: Secondary | ICD-10-CM | POA: Insufficient documentation

## 2013-12-23 DIAGNOSIS — F17200 Nicotine dependence, unspecified, uncomplicated: Secondary | ICD-10-CM | POA: Insufficient documentation

## 2013-12-23 DIAGNOSIS — M1710 Unilateral primary osteoarthritis, unspecified knee: Secondary | ICD-10-CM | POA: Insufficient documentation

## 2013-12-23 DIAGNOSIS — M19012 Primary osteoarthritis, left shoulder: Secondary | ICD-10-CM

## 2013-12-23 NOTE — Patient Instructions (Signed)
Follow up with RN for med refill next month and [name] in 2 months

## 2013-12-23 NOTE — Progress Notes (Signed)
Here for pill count and medication refills. Hydrocodone 10/325 # 8  Fill date 12/02/13   Today NV#  3.5   VSS  Random UDS done today.  She has had no falls.  Educated with a handout given to prevent falls in the home.  She is agitated today because of the [facility] basketball at the [facility] and the traffic is getting on her nerves since she lives in that area.  Her pill count was appropriate since she only gets 80 for the month instead of #90.  She will return next month for a refill and then see [name] the follow ing month.

## 2013-12-24 ENCOUNTER — Emergency Department (HOSPITAL_COMMUNITY): Payer: Medicare HMO

## 2013-12-24 ENCOUNTER — Observation Stay (HOSPITAL_COMMUNITY)
Admission: EM | Admit: 2013-12-24 | Discharge: 2013-12-26 | Disposition: A | Discharge status: Home / Self Care | Payer: Medicare HMO | Attending: Internal Medicine | Admitting: Internal Medicine

## 2013-12-24 ENCOUNTER — Encounter (HOSPITAL_COMMUNITY): Payer: Self-pay | Admitting: Emergency Medicine

## 2013-12-24 DIAGNOSIS — G894 Chronic pain syndrome: Secondary | ICD-10-CM | POA: Diagnosis present

## 2013-12-24 DIAGNOSIS — G4710 Hypersomnia, unspecified: Secondary | ICD-10-CM

## 2013-12-24 DIAGNOSIS — M12812 Other specific arthropathies, not elsewhere classified, left shoulder: Secondary | ICD-10-CM

## 2013-12-24 DIAGNOSIS — M542 Cervicalgia: Secondary | ICD-10-CM

## 2013-12-24 DIAGNOSIS — F439 Reaction to severe stress, unspecified: Secondary | ICD-10-CM | POA: Diagnosis present

## 2013-12-24 DIAGNOSIS — M4800 Spinal stenosis, site unspecified: Secondary | ICD-10-CM | POA: Insufficient documentation

## 2013-12-24 DIAGNOSIS — R110 Nausea: Secondary | ICD-10-CM | POA: Insufficient documentation

## 2013-12-24 DIAGNOSIS — I209 Angina pectoris, unspecified: Secondary | ICD-10-CM

## 2013-12-24 DIAGNOSIS — Z8541 Personal history of malignant neoplasm of cervix uteri: Secondary | ICD-10-CM

## 2013-12-24 DIAGNOSIS — M75102 Unspecified rotator cuff tear or rupture of left shoulder, not specified as traumatic: Secondary | ICD-10-CM

## 2013-12-24 DIAGNOSIS — K589 Irritable bowel syndrome without diarrhea: Secondary | ICD-10-CM

## 2013-12-24 DIAGNOSIS — F319 Bipolar disorder, unspecified: Secondary | ICD-10-CM | POA: Insufficient documentation

## 2013-12-24 DIAGNOSIS — F411 Generalized anxiety disorder: Secondary | ICD-10-CM

## 2013-12-24 DIAGNOSIS — F3289 Other specified depressive episodes: Secondary | ICD-10-CM

## 2013-12-24 DIAGNOSIS — Z720 Tobacco use: Secondary | ICD-10-CM

## 2013-12-24 DIAGNOSIS — I10 Essential (primary) hypertension: Secondary | ICD-10-CM | POA: Insufficient documentation

## 2013-12-24 DIAGNOSIS — G609 Hereditary and idiopathic neuropathy, unspecified: Secondary | ICD-10-CM

## 2013-12-24 DIAGNOSIS — G4700 Insomnia, unspecified: Secondary | ICD-10-CM

## 2013-12-24 DIAGNOSIS — M47817 Spondylosis without myelopathy or radiculopathy, lumbosacral region: Secondary | ICD-10-CM

## 2013-12-24 DIAGNOSIS — G2589 Other specified extrapyramidal and movement disorders: Secondary | ICD-10-CM

## 2013-12-24 DIAGNOSIS — K219 Gastro-esophageal reflux disease without esophagitis: Secondary | ICD-10-CM

## 2013-12-24 DIAGNOSIS — F1123 Opioid dependence with withdrawal: Secondary | ICD-10-CM

## 2013-12-24 DIAGNOSIS — F17200 Nicotine dependence, unspecified, uncomplicated: Secondary | ICD-10-CM | POA: Insufficient documentation

## 2013-12-24 DIAGNOSIS — G4730 Sleep apnea, unspecified: Secondary | ICD-10-CM

## 2013-12-24 DIAGNOSIS — Z79899 Other long term (current) drug therapy: Secondary | ICD-10-CM | POA: Insufficient documentation

## 2013-12-24 DIAGNOSIS — R079 Chest pain, unspecified: Principal | ICD-10-CM | POA: Diagnosis present

## 2013-12-24 DIAGNOSIS — M19012 Primary osteoarthritis, left shoulder: Secondary | ICD-10-CM

## 2013-12-24 DIAGNOSIS — I498 Other specified cardiac arrhythmias: Secondary | ICD-10-CM | POA: Insufficient documentation

## 2013-12-24 DIAGNOSIS — I252 Old myocardial infarction: Secondary | ICD-10-CM | POA: Insufficient documentation

## 2013-12-24 DIAGNOSIS — F329 Major depressive disorder, single episode, unspecified: Secondary | ICD-10-CM

## 2013-12-24 DIAGNOSIS — R1310 Dysphagia, unspecified: Secondary | ICD-10-CM

## 2013-12-24 DIAGNOSIS — Z7982 Long term (current) use of aspirin: Secondary | ICD-10-CM | POA: Insufficient documentation

## 2013-12-24 DIAGNOSIS — R0602 Shortness of breath: Secondary | ICD-10-CM | POA: Insufficient documentation

## 2013-12-24 DIAGNOSIS — IMO0001 Reserved for inherently not codable concepts without codable children: Secondary | ICD-10-CM | POA: Insufficient documentation

## 2013-12-24 DIAGNOSIS — F1193 Opioid use, unspecified with withdrawal: Secondary | ICD-10-CM

## 2013-12-24 DIAGNOSIS — M129 Arthropathy, unspecified: Secondary | ICD-10-CM

## 2013-12-24 DIAGNOSIS — M170 Bilateral primary osteoarthritis of knee: Secondary | ICD-10-CM

## 2013-12-24 HISTORY — DX: Anxiety disorder, unspecified: F41.9

## 2013-12-24 HISTORY — DX: Peptic ulcer, site unspecified, unspecified as acute or chronic, without hemorrhage or perforation: K27.9

## 2013-12-24 HISTORY — DX: Gastro-esophageal reflux disease without esophagitis: K21.9

## 2013-12-24 HISTORY — DX: Unspecified osteoarthritis, unspecified site: M19.90

## 2013-12-24 HISTORY — DX: Essential (primary) hypertension: I10

## 2013-12-24 HISTORY — DX: Obesity, unspecified: E66.9

## 2013-12-24 LAB — CBC
HEMATOCRIT: 41 % (ref 36.0–46.0)
Hemoglobin: 14.3 g/dL (ref 12.0–15.0)
MCH: 28.8 pg (ref 26.0–34.0)
MCHC: 34.9 g/dL (ref 30.0–36.0)
MCV: 82.7 fL (ref 78.0–100.0)
Platelets: 352 10*3/uL (ref 150–400)
RBC: 4.96 MIL/uL (ref 3.87–5.11)
RDW: 13.8 % (ref 11.5–15.5)
WBC: 8.2 10*3/uL (ref 4.0–10.5)

## 2013-12-24 LAB — BASIC METABOLIC PANEL
BUN: 9 mg/dL (ref 6–23)
CALCIUM: 10.3 mg/dL (ref 8.4–10.5)
CO2: 22 mEq/L (ref 19–32)
CREATININE: 0.85 mg/dL (ref 0.50–1.10)
Chloride: 99 mEq/L (ref 96–112)
GFR calc non Af Amer: 79 mL/min — ABNORMAL LOW (ref >90)
Glucose, Bld: 82 mg/dL (ref 70–99)
Potassium: 3.6 mEq/L — ABNORMAL LOW (ref 3.7–5.3)
Sodium: 139 mEq/L (ref 137–147)

## 2013-12-24 LAB — I-STAT TROPONIN, ED: Troponin i, poc: 0 ng/mL (ref 0.00–0.08)

## 2013-12-24 MED ORDER — ASPIRIN 81 MG PO CHEW
324.0000 mg | CHEWABLE_TABLET | Freq: Once | ORAL | Status: AC
  Administered 2013-12-24: 324 mg via ORAL
  Filled 2013-12-24: qty 4

## 2013-12-24 MED ORDER — NITROGLYCERIN 0.4 MG SL SUBL
0.4000 mg | SUBLINGUAL_TABLET | SUBLINGUAL | Status: DC | PRN
  Administered 2013-12-24 (×2): 0.4 mg via SUBLINGUAL
  Filled 2013-12-24: qty 1

## 2013-12-24 NOTE — ED Notes (Signed)
Presents with sternal chest pain described as "elephant sitting on my chest" denies radiation, pain is associated with SOB and nausea, denies dizziness. Pain began yesterday.  Uses a pain clinic and takes Norco, unable to get refill, took last one yesterday. PT states, "I think I am dope sick. I think I am jonesing. I don't know what the hell is going on"  Nothing makes pain better and pain is progressively worsening.

## 2013-12-25 ENCOUNTER — Encounter (HOSPITAL_COMMUNITY): Payer: Self-pay | Admitting: Internal Medicine

## 2013-12-25 DIAGNOSIS — I209 Angina pectoris, unspecified: Secondary | ICD-10-CM | POA: Insufficient documentation

## 2013-12-25 DIAGNOSIS — F439 Reaction to severe stress, unspecified: Secondary | ICD-10-CM | POA: Diagnosis present

## 2013-12-25 DIAGNOSIS — F319 Bipolar disorder, unspecified: Secondary | ICD-10-CM

## 2013-12-25 DIAGNOSIS — G894 Chronic pain syndrome: Secondary | ICD-10-CM | POA: Diagnosis present

## 2013-12-25 DIAGNOSIS — F17200 Nicotine dependence, unspecified, uncomplicated: Secondary | ICD-10-CM

## 2013-12-25 DIAGNOSIS — R079 Chest pain, unspecified: Secondary | ICD-10-CM | POA: Diagnosis present

## 2013-12-25 LAB — COMPREHENSIVE METABOLIC PANEL
ALK PHOS: 76 U/L (ref 39–117)
ALT: 13 U/L (ref 0–35)
AST: 19 U/L (ref 0–37)
Albumin: 3.6 g/dL (ref 3.5–5.2)
BILIRUBIN TOTAL: 0.4 mg/dL (ref 0.3–1.2)
BUN: 13 mg/dL (ref 6–23)
CO2: 26 meq/L (ref 19–32)
Calcium: 9.5 mg/dL (ref 8.4–10.5)
Chloride: 103 mEq/L (ref 96–112)
Creatinine, Ser: 0.9 mg/dL (ref 0.50–1.10)
GFR calc Af Amer: 86 mL/min — ABNORMAL LOW (ref >90)
GFR, EST NON AFRICAN AMERICAN: 74 mL/min — AB (ref >90)
GLUCOSE: 94 mg/dL (ref 70–99)
POTASSIUM: 3.4 meq/L — AB (ref 3.7–5.3)
Sodium: 143 mEq/L (ref 137–147)
Total Protein: 6.9 g/dL (ref 6.0–8.3)

## 2013-12-25 LAB — CBC
HCT: 38.2 % (ref 36.0–46.0)
Hemoglobin: 12.9 g/dL (ref 12.0–15.0)
MCH: 28.3 pg (ref 26.0–34.0)
MCHC: 33.8 g/dL (ref 30.0–36.0)
MCV: 83.8 fL (ref 78.0–100.0)
PLATELETS: 292 10*3/uL (ref 150–400)
RBC: 4.56 MIL/uL (ref 3.87–5.11)
RDW: 14.1 % (ref 11.5–15.5)
WBC: 8.7 10*3/uL (ref 4.0–10.5)

## 2013-12-25 LAB — TROPONIN I: Troponin I: <0.3 ng/mL (ref <0.30)

## 2013-12-25 LAB — TSH: TSH: 1.515 u[IU]/mL (ref 0.350–4.500)

## 2013-12-25 LAB — MAGNESIUM: Magnesium: 2.2 mg/dL (ref 1.5–2.5)

## 2013-12-25 LAB — PHOSPHORUS: Phosphorus: 3.9 mg/dL (ref 2.3–4.6)

## 2013-12-25 LAB — HEMOGLOBIN A1C
HEMOGLOBIN A1C: 5.6 % (ref <5.7)
Mean Plasma Glucose: 114 mg/dL (ref <117)

## 2013-12-25 MED ORDER — ONDANSETRON HCL 4 MG/2ML IJ SOLN: 4.0000 mg | Freq: Four times a day (QID) | INTRAMUSCULAR | Status: DC | PRN

## 2013-12-25 MED ORDER — GABAPENTIN 300 MG PO CAPS
300.0000 mg | ORAL_CAPSULE | Freq: Three times a day (TID) | ORAL | Status: DC
  Administered 2013-12-25 – 2013-12-26 (×5): 300 mg via ORAL
  Filled 2013-12-25 (×7): qty 1

## 2013-12-25 MED ORDER — ASPIRIN 81 MG PO CHEW
81.0000 mg | CHEWABLE_TABLET | Freq: Every day | ORAL | Status: DC
  Administered 2013-12-25 – 2013-12-26 (×2): 81 mg via ORAL
  Filled 2013-12-25: qty 1

## 2013-12-25 MED ORDER — ACETAMINOPHEN 325 MG PO TABS: 650.0000 mg | ORAL_TABLET | Freq: Four times a day (QID) | ORAL | Status: DC | PRN

## 2013-12-25 MED ORDER — ONDANSETRON HCL 4 MG PO TABS
4.0000 mg | ORAL_TABLET | Freq: Four times a day (QID) | ORAL | Status: DC | PRN
Start: 2013-12-25 — End: 2013-12-26

## 2013-12-25 MED ORDER — SODIUM CHLORIDE 0.9 % IJ SOLN
3.0000 mL | Freq: Two times a day (BID) | INTRAMUSCULAR | Status: DC
  Administered 2013-12-25 – 2013-12-26 (×3): 3 mL via INTRAVENOUS

## 2013-12-25 MED ORDER — ALPRAZOLAM 0.25 MG PO TABS: 1.0000 mg | ORAL_TABLET | Freq: Three times a day (TID) | ORAL | Status: DC

## 2013-12-25 MED ORDER — HYDROCODONE-ACETAMINOPHEN 10-325 MG PO TABS
1.0000 | ORAL_TABLET | Freq: Three times a day (TID) | ORAL | Status: DC | PRN
  Administered 2013-12-25 – 2013-12-26 (×2): 1 via ORAL
  Filled 2013-12-25 (×2): qty 1

## 2013-12-25 MED ORDER — ALPRAZOLAM 0.5 MG PO TABS: 1.0000 mg | ORAL_TABLET | Freq: Three times a day (TID) | ORAL | Status: DC

## 2013-12-25 MED ORDER — CYCLOBENZAPRINE HCL 10 MG PO TABS
10.0000 mg | ORAL_TABLET | Freq: Every day | ORAL | Status: DC
  Administered 2013-12-25 (×2): 10 mg via ORAL
  Filled 2013-12-25: qty 1
  Filled 2013-12-25 (×2): qty 2

## 2013-12-25 MED ORDER — NICOTINE 21 MG/24HR TD PT24
21.0000 mg | MEDICATED_PATCH | Freq: Every day | TRANSDERMAL | Status: DC
  Administered 2013-12-25 – 2013-12-26 (×2): 21 mg via TRANSDERMAL
  Filled 2013-12-25 (×2): qty 1

## 2013-12-25 MED ORDER — SODIUM CHLORIDE 0.9 % IJ SOLN
3.0000 mL | Freq: Two times a day (BID) | INTRAMUSCULAR | Status: DC
Start: 2013-12-25 — End: 2013-12-26
  Administered 2013-12-25 (×2): 3 mL via INTRAVENOUS

## 2013-12-25 MED ORDER — SODIUM CHLORIDE 0.9 % IJ SOLN: 3.0000 mL | INTRAMUSCULAR | Status: DC | PRN

## 2013-12-25 MED ORDER — DULOXETINE HCL 60 MG PO CPEP
60.0000 mg | ORAL_CAPSULE | Freq: Every day | ORAL | Status: DC
  Administered 2013-12-25 – 2013-12-26 (×2): 60 mg via ORAL
  Filled 2013-12-25 (×2): qty 1

## 2013-12-25 MED ORDER — DIVALPROEX SODIUM ER 500 MG PO TB24
1500.0000 mg | ORAL_TABLET | Freq: Every evening | ORAL | Status: DC
  Administered 2013-12-25: 1500 mg via ORAL
  Filled 2013-12-25 (×2): qty 3

## 2013-12-25 MED ORDER — ACETAMINOPHEN 650 MG RE SUPP
650.0000 mg | Freq: Four times a day (QID) | RECTAL | Status: DC | PRN
Start: 2013-12-25 — End: 2013-12-26

## 2013-12-25 MED ORDER — ENOXAPARIN SODIUM 40 MG/0.4ML ~~LOC~~ SOLN
40.0000 mg | SUBCUTANEOUS | Status: DC
  Administered 2013-12-26: 40 mg via SUBCUTANEOUS
  Filled 2013-12-25 (×2): qty 0.4

## 2013-12-25 MED ORDER — SODIUM CHLORIDE 0.9 % IV SOLN: 250.0000 mL | INTRAVENOUS | Status: DC | PRN

## 2013-12-25 MED ORDER — DOCUSATE SODIUM 100 MG PO CAPS
100.0000 mg | ORAL_CAPSULE | Freq: Two times a day (BID) | ORAL | Status: DC
  Administered 2013-12-25 – 2013-12-26 (×4): 100 mg via ORAL
  Filled 2013-12-25 (×4): qty 1

## 2013-12-25 MED ORDER — ALPRAZOLAM 0.25 MG PO TABS
1.0000 mg | ORAL_TABLET | Freq: Three times a day (TID) | ORAL | Status: DC
  Administered 2013-12-25 – 2013-12-26 (×5): 1 mg via ORAL
  Filled 2013-12-25 (×5): qty 4

## 2013-12-25 MED ORDER — ALPRAZOLAM 0.25 MG PO TABS
1.0000 mg | ORAL_TABLET | Freq: Three times a day (TID) | ORAL | Status: DC
  Filled 2013-12-25: qty 4

## 2013-12-25 NOTE — Consult Note (Signed)
Cardiology Consult Note   Patient ID: [name] MRN: [id number], DOB/AGE: 1966/08/20   Admit date: 12/24/2013 Date of Consult: 12/25/2013  Primary Physician: PROVIDER NOT IN SYSTEM Primary Cardiologist: previously T. Wall, MD  Reason for consult: chest pain  HPI: [name] is a 48 y.o. female w/ no prior cardiac history, PMHx s/f chronic pain requiring chronic narcotics, OA, fibromyalgia, HTN, obesity, ongoing tobacco abuse, GERD, PUD, anxiety and bipolar disorder who was brought in to [facility] last night for observation after c/o chest pain.   She has multiple ED presentations for chest pain. She was seen by Dr. Verl Blalock 03/2010 in consultation in the setting of chest pain w/ atypical and typical features. She ruled out. Nuclear stress testing and echo (as below) were normal.   She was in her USOH until yesterday. She had run out of her Norco and Xanax. She had been working as a Scientist, water quality at [employer] and on her feet most of the day for the past week. She had no chest discomfort with this. Last night, she began experiencing substernal chest pressure with associated shortness of breath. The discomfort intensified to a 6-7/10. She drove to [facility] ED to pick up her grandchild in the peds ED. She initially attributed this to indigestion, but sought medical attention when the discomfort persisted. She received NTG w/ some improvement. Aggravating factors included deep inspiration. She denies extended travel or unilateral leg swelling. No DOE/SOB, PND, orthopnea, palpitations or LE edema.   EKG in the ED revealed sinus tachycardia (110 bpm), downsloping ST depressions V1-V6, II, III, aVF. POC trop-I returned WNL. BMET and CBC returned WNL. Portable CXR w/o acute abnormalities. Aside from tachycardia, VS were stable. No evidence of hypoxia.  She was observed overnight by the medicine service. A subsequent formal troponin has returned WNL this AM. EKG today reveals NSR (72 bpm), TWIs V1-V3,  subtle upsloping ST depressions V4-V6, I, II, aVL.   Problem List: Past Medical History  Diagnosis Date  . Fibromyalgia   . Bipolar 1 disorder   . Spinal stenosis   . Osteoarthritis   . Hypertension   . Obesity   . GERD (gastroesophageal reflux disease)   . PUD (peptic ulcer disease)   . Anxiety     Past Surgical History  Procedure Laterality Date  . Abdominal hysterectomy    . Arthroscopic knee    . Cardiovascular stress test  03/2010    Lexiscan Myoview: EF 55%, mild breast attenuation, no ischemia or scar  . Transthoracic echocardiogram  03/2010    EF 50-55%, normal LV size, no WMAs     Allergies:  Allergies  Allergen Reactions  . Abilify [Aripiprazole] Other (See Comments)    Causes body shaking and tremors  . Latuda [Lurasidone Hcl] Other (See Comments)    Causes body shaking and tremors  . Naproxen Anaphylaxis    Home Medications: Prior to Admission medications   Medication Sig Start Date End Date Taking? Authorizing Provider  alprazolam Duanne Moron) 2 MG tablet Take 1 mg by mouth 3 (three) times daily.    Yes Historical Provider, MD  aspirin 81 MG chewable tablet Chew 81 mg by mouth daily.   Yes Historical Provider, MD  cyclobenzaprine (FLEXERIL) 10 MG tablet Take 1-2 tablets (10-20 mg total) by mouth at bedtime. 12/01/13  Yes Ivan Anchors Love, PA-C  divalproex (DEPAKOTE ER) 500 MG 24 hr tablet Take 1,500 mg by mouth every evening.     Yes Historical Provider, MD  DULoxetine (  CYMBALTA) 60 MG capsule Take 1 capsule (60 mg total) by mouth daily. 08/03/13  Yes Meredith Staggers, MD  gabapentin (NEURONTIN) 300 MG capsule Take 1 capsule (300 mg total) by mouth 3 (three) times daily. 05/10/13  Yes Meredith Staggers, MD  HYDROcodone-acetaminophen (NORCO) 10-325 MG per tablet Take 1 tablet by mouth every 8 (eight) hours as needed. 12/21/13  Yes Meredith Staggers, MD  lidocaine (XYLOCAINE) 5 % ointment Apply 1 application topically 4 (four) times daily as needed. 1 month supply 12/16/13   Yes Meredith Staggers, MD    Inpatient Medications:  . ALPRAZolam  1 mg Oral TID  . aspirin  81 mg Oral Daily  . cyclobenzaprine  10-20 mg Oral QHS  . divalproex  1,500 mg Oral QPM  . docusate sodium  100 mg Oral BID  . DULoxetine  60 mg Oral Daily  . enoxaparin (LOVENOX) injection  40 mg Subcutaneous Q24H  . gabapentin  300 mg Oral TID  . nicotine  21 mg Transdermal Daily  . sodium chloride  3 mL Intravenous Q12H  . sodium chloride  3 mL Intravenous Q12H   Prescriptions prior to admission  Medication Sig Dispense Refill  . alprazolam (XANAX) 2 MG tablet Take 1 mg by mouth 3 (three) times daily.       Marland Kitchen aspirin 81 MG chewable tablet Chew 81 mg by mouth daily.      . cyclobenzaprine (FLEXERIL) 10 MG tablet Take 1-2 tablets (10-20 mg total) by mouth at bedtime.  60 tablet  3  . divalproex (DEPAKOTE ER) 500 MG 24 hr tablet Take 1,500 mg by mouth every evening.        . DULoxetine (CYMBALTA) 60 MG capsule Take 1 capsule (60 mg total) by mouth daily.  30 capsule  4  . gabapentin (NEURONTIN) 300 MG capsule Take 1 capsule (300 mg total) by mouth 3 (three) times daily.  90 capsule  4  . HYDROcodone-acetaminophen (NORCO) 10-325 MG per tablet Take 1 tablet by mouth every 8 (eight) hours as needed.  80 tablet  0  . lidocaine (XYLOCAINE) 5 % ointment Apply 1 application topically 4 (four) times daily as needed. 1 month supply  35.44 g  4    Family History  Problem Relation Age of Onset  . Diabetes Other   . Hypertension Other   . Schizophrenia Mother   . Hypertension Mother   . Stroke Father      History   Social History  . Marital Status: Divorced    Spouse Name: N/A    Number of Children: N/A  . Years of Education: N/A   Occupational History  . Not on file.   Social History Main Topics  . Smoking status: Current Every Day Smoker -- 0.50 packs/day    Types: Cigarettes  . Smokeless tobacco: Never Used  . Alcohol Use: No  . Drug Use: No  . Sexual Activity: Not on file    Other Topics Concern  . Not on file   Social History Narrative  . No narrative on file     Review of Systems: General: negative for chills, fever, night sweats or weight changes.  Cardiovascular: positive for chest pain, shortness of breath, negative for DOE, edema, orthopnea, palpitations, paroxysmal nocturnal dyspnea Dermatological: negative for rash Respiratory: negative for cough or wheezing Urologic: negative for hematuria Abdominal: negative for nausea, vomiting, diarrhea, bright red blood per rectum, melena, or hematemesis Neurologic: negative for visual changes, syncope, or dizziness All  other systems reviewed and are otherwise negative except as noted above.  Physical Exam: Blood pressure 131/83, pulse 82, temperature 97.9 F (36.6 C), temperature source Oral, resp. rate 18, height 5\' 3"  (1.6 m), weight 110.36 kg (243 lb 4.8 oz), SpO2 96.00%.    General: Well developed, obese-appearing female, in no acute distress. Head: Normocephalic, atraumatic, sclera non-icteric, no xanthomas, nares are without discharge.  Neck: Negative for carotid bruits. JVD not elevated. Lungs: Clear bilaterally to auscultation without wheezes, rales, or rhonchi. Breathing is unlabored. Heart: RRR with S1 S2. No murmurs, rubs, or gallops appreciated. Abdomen: Soft, non-tender, non-distended with normoactive bowel sounds. No hepatomegaly. No rebound/guarding. No obvious abdominal masses. Msk:  Strength and tone appears normal for age. Extremities: No clubbing, cyanosis or edema.  Distal pedal pulses are 2+ and equal bilaterally. Neuro: Alert and oriented X 3. Moves all extremities spontaneously. Psych:  Responds to questions appropriately with a normal affect.  Labs: Recent Labs     12/24/13  2303  12/25/13  0810  WBC  8.2  8.7  HGB  14.3  12.9  HCT  41.0  38.2  MCV  82.7  83.8  PLT  352  292   Recent Labs Lab 12/24/13 2303  NA 139  K 3.6*  CL 99  CO2 22  BUN 9  CREATININE 0.85   CALCIUM 10.3  GLUCOSE 82   Radiology/Studies: Dg Chest Port 1 View  12/24/2013   CLINICAL DATA:  Chest pain  EXAM: PORTABLE CHEST - 1 VIEW  COMPARISON:  09/17/2011  FINDINGS: The heart size and mediastinal contours are within normal limits. Both lungs are clear. The visualized skeletal structures are unremarkable.  IMPRESSION: No active disease.   Electronically Signed   By: Franchot Gallo M.D.   On: 12/24/2013 23:58   EKG: see HPI  ASSESSMENT AND PLAN:   1. Atypical chest pain 2. Abnormal EKG 3. Ongoing tobacco abuse 4. HTN 5. Obesity 6. Fibromyalgia 7. Chronic pain syndrome 8. OA 9. Anxiety 10. Bipolar disorder 11. GERD 12. PUD  The patient has been observed overnight after presenting w/ sudden onset, non-exertional, constant substernal chest pressure w/ associated dyspnea, aggravated by deep inspiration and relived w/ NTG SL x 2. Objectively, initial EKG revealed sinus tachycardia with diffuse downsloping ST depressions and TWIs. Trop-I x 2 WNL. EKG today with anterior TWIs, subtle ST depressions (upsloping) V5, V6, I, II, aVL. She has a history of recurrent chest pain from 2011 and beyond upon chart review. Myoview and echo in 2011 were normal.  Differential diagnosis of her chest pain includes narcotic withdrawal, anxiety (history of this, sinus tachycardiac in ED), GERD/reflux (history of this, similar quality, and relieved w/ NTG), fibromyalgia, ACS. Low suspicion for PE. Cardiac RFs include ongoing tobacco abuse, obesity and hypertension. 2D echo has been ordered and is pending-- this may need to be done with contrast given body habitus. Will review further to r/o cardiomyopathy, WMAs, etc. Check Hgb A1C, lipid panel to risk stratify. Check TSH, free T4. Obtain an additional troponin for formal rule out. Proceed with Lexiscan Cardiolite vs coronary CT. If echo reveals WMAs, reduced EF, will need cath. Continue low-dose ASA. Tobacco cessation stressed.   Signed, R. Valeria Batman, PA-C 12/25/2013, 9:32 AM Agree with assessment as noted above.  I reviewed her EKGs.  EKGs on admission showed widespread ST segment downsloping depression.  EKG today at slower heart rate shows improvement in ST segment depression and there is T wave inversion in the  anteroseptal leads.  So far her initial troponin is normal and we are cycling enzymes .  Today she is resting comfortably with resolution of chest discomfort.  Await results of echocardiogram and wall motion.  With her large body habitus she may do better with a coronary CT angiogram then a LexiScan.  She will be kept n.p.o. tonight and decide in a.m. which test would work best for her.

## 2013-12-25 NOTE — Progress Notes (Signed)
Utilization Review Completed.   Hayato Guaman, RN, BSN Nurse Case Manager  

## 2013-12-25 NOTE — H&P (Signed)
PCP: Wilma Flavin at [facility]    Chief Complaint:  Chest pain  HPI: [name] is a 48 y.o. female   has a past medical history of Fibromyalgia; Bipolar 1 disorder; and Spinal stenosis.   Presented with  Patient states she ran out her norco and xanax. Reports chest pain that started this evening while she was down in Pediatric ER. She describes sensation as a pressure on her chest lasting only for a few hours. The pain improved with nitroglycerine. Currently she is chest pain free patient states she pain in the end felt more like indigestion. She had some shortness of breath associated with that. Cardiac enzymes are negative but ECG was worrisome for T wave inversions in lateral and inferior leads.  Review of Systems:    Pertinent positives include: chest pain, shortness of breath at rest.   Constitutional:  No weight loss, night sweats, Fevers, chills, fatigue, weight loss  HEENT:  No headaches, Difficulty swallowing,Tooth/dental problems,Sore throat,  No sneezing, itching, ear ache, nasal congestion, post nasal drip,  Cardio-vascular:  No  Orthopnea, PND, anasarca, dizziness, palpitations.no Bilateral lower extremity swelling  GI:  No heartburn, indigestion, abdominal pain, nausea, vomiting, diarrhea, change in bowel habits, loss of appetite, melena, blood in stool, hematemesis Resp:   No dyspnea on exertion, No excess mucus, no productive cough, No non-productive cough, No coughing up of blood.No change in color of mucus.No wheezing. Skin:  no rash or lesions. No jaundice GU:  no dysuria, change in color of urine, no urgency or frequency. No straining to urinate.  No flank pain.  Musculoskeletal:  No joint pain or no joint swelling. No decreased range of motion. No back pain.  Psych:  No change in mood or affect. No depression or anxiety. No memory loss.  Neuro: no localizing neurological complaints, no tingling, no weakness, no double vision, no gait abnormality, no  slurred speech, no confusion  Otherwise ROS are negative except for above, 10 systems were reviewed  Past Medical History: Past Medical History  Diagnosis Date  . Fibromyalgia   . Bipolar 1 disorder   . Spinal stenosis    Past Surgical History  Procedure Laterality Date  . Abdominal hysterectomy    . Arthroscopic knee       Medications: Prior to Admission medications   Medication Sig Start Date End Date Taking? Authorizing Provider  alprazolam Duanne Moron) 2 MG tablet Take 1 mg by mouth 3 (three) times daily.    Yes Historical Provider, MD  aspirin 81 MG chewable tablet Chew 81 mg by mouth daily.   Yes Historical Provider, MD  cyclobenzaprine (FLEXERIL) 10 MG tablet Take 1-2 tablets (10-20 mg total) by mouth at bedtime. 12/01/13  Yes Ivan Anchors Love, PA-C  divalproex (DEPAKOTE ER) 500 MG 24 hr tablet Take 1,500 mg by mouth every evening.     Yes Historical Provider, MD  DULoxetine (CYMBALTA) 60 MG capsule Take 1 capsule (60 mg total) by mouth daily. 08/03/13  Yes Meredith Staggers, MD  gabapentin (NEURONTIN) 300 MG capsule Take 1 capsule (300 mg total) by mouth 3 (three) times daily. 05/10/13  Yes Meredith Staggers, MD  HYDROcodone-acetaminophen (NORCO) 10-325 MG per tablet Take 1 tablet by mouth every 8 (eight) hours as needed. 12/21/13  Yes Meredith Staggers, MD  lidocaine (XYLOCAINE) 5 % ointment Apply 1 application topically 4 (four) times daily as needed. 1 month supply 12/16/13  Yes Meredith Staggers, MD    Allergies:   Allergies  Allergen Reactions  . Abilify [Aripiprazole] Other (See Comments)    Causes body shaking and tremors  . Latuda [Lurasidone Hcl] Other (See Comments)    Causes body shaking and tremors  . Naproxen Anaphylaxis    Social History:  Ambulatory  independently   Lives at  Home with family   reports that she has been smoking Cigarettes.  She has been smoking about 0.50 packs per day. She has never used smokeless tobacco. She reports that she does not drink  alcohol or use illicit drugs.   Family History: family history includes Diabetes in her other; Hypertension in her mother and other; Schizophrenia in her mother; Stroke in her father.    Physical Exam: Patient Vitals for the past 24 hrs:  BP Temp Temp src Pulse Resp SpO2 Height Weight  12/24/13 2324 104/72 mmHg - - - - - - -  12/24/13 2308 127/82 mmHg - Oral - 25 99 % - -  12/24/13 2249 126/93 mmHg 99.3 F (37.4 C) Oral 121 20 96 % 5\' 3"  (1.6 m) 118.389 kg (261 lb)    1. General:  in No Acute distress 2. Psychological: Alert and  Oriented 3. Head/ENT:   Moist  Mucous Membranes                          Head Non traumatic, neck supple                          Normal    Dentition 4. SKIN: normal   Skin turgor,  Skin clean Dry and intact no rash 5. Heart: Regular rate and rhythm no Murmur, Rub or gallop 6. Lungs: Clear to auscultation bilaterally, no wheezes or crackles   7. Abdomen: Soft, non-tender, Non distended 8. Lower extremities: no clubbing, cyanosis, or edema 9. Neurologically Grossly intact, moving all 4 extremities equally 10. MSK: Normal range of motion  body mass index is 46.25 kg/(m^2).   Labs on Admission:   Recent Labs  12/24/13 2303  NA 139  K 3.6*  CL 99  CO2 22  GLUCOSE 82  BUN 9  CREATININE 0.85  CALCIUM 10.3   No results found for this basename: AST, ALT, ALKPHOS, BILITOT, PROT, ALBUMIN,  in the last 72 hours No results found for this basename: LIPASE, AMYLASE,  in the last 72 hours  Recent Labs  12/24/13 2303  WBC 8.2  HGB 14.3  HCT 41.0  MCV 82.7  PLT 352   No results found for this basename: CKTOTAL, CKMB, CKMBINDEX, TROPONINI,  in the last 72 hours No results found for this basename: TSH, T4TOTAL, FREET3, T3FREE, THYROIDAB,  in the last 72 hours No results found for this basename: VITAMINB12, FOLATE, FERRITIN, TIBC, IRON, RETICCTPCT,  in the last 72 hours Lab Results  Component Value Date   HGBA1C  Value: 5.4 (NOTE)                                                                        According to the ADA Clinical Practice Recommendations for 2011, when HbA1c is used as a screening test:   >=6.5%   Diagnostic of Diabetes Mellitus           (  if abnormal result  is confirmed)  5.7-6.4%   Increased risk of developing Diabetes Mellitus  References:Diagnosis and Classification of Diabetes Mellitus,Diabetes [id number] 1):S62-S69 and Standards of Medical Care in         Diabetes - 2011,Diabetes [id number]  (Suppl 1):S11-S61. 03/29/2010    Estimated Creatinine Clearance: 99.6 ml/min (by C-G formula based on Cr of 0.85). ABG No results found for this basename: phart, pco2, po2, hco3, tco2, acidbasedef, o2sat     No results found for this basename: DDIMER     Other results:  I have pearsonaly reviewed this: ECG REPORT  Rate: 110  Rhythm: Sinus tachycardia ST&T Change: t wave inversion in ii, iii aVF v3-v6   Cultures:    Component Value Date/Time   SDES URINE, CLEAN CATCH 01/26/2012 2105   [place] Normal 01/26/2012 2105   CULT ESCHERICHIA COLI 01/26/2012 2105   REPTSTATUS 01/28/2012 FINAL 01/26/2012 2105       Radiological Exams on Admission: Dg Chest Port 1 View  12/24/2013   CLINICAL DATA:  Chest pain  EXAM: PORTABLE CHEST - 1 VIEW  COMPARISON:  09/17/2011  FINDINGS: The heart size and mediastinal contours are within normal limits. Both lungs are clear. The visualized skeletal structures are unremarkable.  IMPRESSION: No active disease.   Electronically Signed   By: Franchot Gallo M.D.   On: 12/24/2013 23:58    Chart has been reviewed  Assessment/Plan  48 yo F with chest pain in the setting of tobacco abuse and obesity  Present on Admission:  . Chest pain at rest - admit to tele cycle ce, serial ECG, lipid panel in AM, obtain echo for wall motion abnormality, patient would benefit from stress testing given risk factors. Troponin negative so far.  . Chronic pain disorder - restart norco avoid excalation  with IV drugs Tobacco abuse - spoke about importance of quiting, counseling ordered   Prophylaxis:   Lovenox, Protonix  CODE STATUS: FULL CODE  Other plan as per orders.  I have spent a total of 55 min on this admission  Kylani Wires 12/25/2013, 12:16 AM

## 2013-12-25 NOTE — ED Provider Notes (Addendum)
CSN: [id number]     Arrival date & time 12/24/13  2239 History   First MD Initiated Contact with Patient 12/24/13 2304     Chief Complaint  Patient presents with  . Chest Pain     (Consider location/radiation/quality/duration/timing/severity/associated sxs/prior Treatment) HPI Comments: Pt with hx of HTN, ? MI in 2011 with no intervention, with + smoking hx comes in with cc of chest pain. Chest pain is mid-sternal, non radiating and is associated with some nausea and dib. Pain has no specific aggravating or relieving factors. She has hx of chronic pain, and ran out of them y'day - so she thought she was just having withdrawals. Pt's pain is getting worse, and is constant, and is rated at 7/10.  Patient is a 48 y.o. female presenting with chest pain. The history is provided by the patient.  Chest Pain Associated symptoms: nausea   Associated symptoms: no abdominal pain, no headache, no shortness of breath and not vomiting     Past Medical History  Diagnosis Date  . Fibromyalgia   . Bipolar 1 disorder   . MI (myocardial infarction)   . Spinal stenosis    Past Surgical History  Procedure Laterality Date  . Abdominal hysterectomy    . Arthroscopic knee     Family History  Problem Relation Age of Onset  . Diabetes Other   . Hypertension Other   . Schizophrenia Mother   . Hypertension Mother   . Stroke Father    History  Substance Use Topics  . Smoking status: Current Every Day Smoker -- 0.50 packs/day    Types: Cigarettes  . Smokeless tobacco: Never Used  . Alcohol Use: No   OB History   Grav Para Term Preterm Abortions TAB SAB Ect Mult Living                 Review of Systems  Constitutional: Negative for activity change.  Respiratory: Negative for shortness of breath.   Cardiovascular: Positive for chest pain.  Gastrointestinal: Positive for nausea. Negative for vomiting and abdominal pain.  Genitourinary: Negative for dysuria.  Musculoskeletal: Negative for neck  pain.  Neurological: Negative for headaches.  All other systems reviewed and are negative.      Allergies  Abilify; Latuda; and Naproxen  Home Medications   Current Outpatient Rx  Name  Route  Sig  Dispense  Refill  . alprazolam (XANAX) 2 MG tablet   Oral   Take 1 mg by mouth 3 (three) times daily.          Marland Kitchen aspirin 81 MG chewable tablet   Oral   Chew 81 mg by mouth daily.         . cyclobenzaprine (FLEXERIL) 10 MG tablet   Oral   Take 1-2 tablets (10-20 mg total) by mouth at bedtime.   60 tablet   3   . divalproex (DEPAKOTE ER) 500 MG 24 hr tablet   Oral   Take 1,500 mg by mouth every evening.           . DULoxetine (CYMBALTA) 60 MG capsule   Oral   Take 1 capsule (60 mg total) by mouth daily.   30 capsule   4   . gabapentin (NEURONTIN) 300 MG capsule   Oral   Take 1 capsule (300 mg total) by mouth 3 (three) times daily.   90 capsule   4   . HYDROcodone-acetaminophen (NORCO) 10-325 MG per tablet   Oral   Take 1 tablet  by mouth every 8 (eight) hours as needed.   80 tablet   0     One month supply   . lidocaine (XYLOCAINE) 5 % ointment   Topical   Apply 1 application topically 4 (four) times daily as needed. 1 month supply   35.44 g   4    BP 104/72  Pulse 121  Temp(Src) 99.3 F (37.4 C) (Oral)  Resp 25  Ht 5\' 3"  (1.6 m)  Wt 261 lb (118.389 kg)  BMI 46.25 kg/m2  SpO2 99% Physical Exam  Nursing note and vitals reviewed. Constitutional: She is oriented to person, place, and time. She appears well-developed and well-nourished.  HENT:  Head: Normocephalic and atraumatic.  Eyes: EOM are normal. Pupils are equal, round, and reactive to light.  Neck: Neck supple.  Cardiovascular: Normal rate, regular rhythm and normal heart sounds.   No murmur heard. Pulmonary/Chest: Effort normal. No respiratory distress.  Abdominal: Soft. She exhibits no distension. There is no tenderness. There is no rebound and no guarding.  Neurological: She is alert  and oriented to person, place, and time.  Skin: Skin is warm and dry.    ED Course  Procedures (including critical care time) Labs Review Labs Reviewed  BASIC METABOLIC PANEL - Abnormal; Notable for the following:    Potassium 3.6 (*)    GFR calc non Af Amer 79 (*)    All other components within normal limits  CBC  I-STAT TROPOININ, ED   Imaging Review Dg Chest Port 1 View  12/24/2013   CLINICAL DATA:  Chest pain  EXAM: PORTABLE CHEST - 1 VIEW  COMPARISON:  09/17/2011  FINDINGS: The heart size and mediastinal contours are within normal limits. Both lungs are clear. The visualized skeletal structures are unremarkable.  IMPRESSION: No active disease.   Electronically Signed   By: Franchot Gallo M.D.   On: 12/24/2013 23:58     EKG Interpretation   Date/Time:  Saturday December 24 2013 22:44:44 EDT Ventricular Rate:  110 PR Interval:  130 QRS Duration: 88 QT Interval:  314 QTC Calculation: 424 R Axis:   57 Text Interpretation:  Sinus tachycardia ST \\T \ T wave abnormality,  consider inferior ischemia ST \\T \ T wave abnormality, consider  anterolateral ischemia Confirmed by Kathrynn Humble, MD, Thelma Comp [phone]) on  12/24/2013 11:06:45 PM      MDM   Final diagnoses:  None    Pt comes in with cc of chest pain. Mid-sternal chest pain, pressure like, and it responded to nitro. + associated nausea and emesis.  Pt also has new inferolateral t wave inversions associated with this typical sounding chest pain - so we will admit. Pt has received ASA full dose, and currently chest pain free post 2 nitro.  She might also be having a little narcotic withdrawal - with her tachycardia, nausea. PE was considered as well, but in light of other things more possible, we will not work that up in the ER.  Varney Biles, MD 12/25/13 0016  Smoking cessation instruction/counseling given:  counseled patient on the dangers of tobacco use, advised patient to stop smoking, and reviewed strategies to maximize  success. Time spend- 5 minutes   Varney Biles, MD 12/25/13 6045

## 2013-12-25 NOTE — Progress Notes (Addendum)
Pt seen and examined, admitted this am per Dr.Doutova 47/F with HTN, heavy smoker, Fibromyalgia syndrome admitted with Chest Pressure " Brick sitting on my chest" , resolved with SL Nitro, now chest pain free. EKG with TWI in anterior leads unchanged from before Cycle cardiac enzymes Check ECHO  Stress test 2011 negative Cards consult requested  Domenic Polite, MD [phone]

## 2013-12-26 ENCOUNTER — Observation Stay (HOSPITAL_COMMUNITY): Payer: Medicare HMO

## 2013-12-26 DIAGNOSIS — I369 Nonrheumatic tricuspid valve disorder, unspecified: Secondary | ICD-10-CM

## 2013-12-26 DIAGNOSIS — R079 Chest pain, unspecified: Secondary | ICD-10-CM

## 2013-12-26 DIAGNOSIS — G894 Chronic pain syndrome: Secondary | ICD-10-CM

## 2013-12-26 LAB — LIPID PANEL
Cholesterol: 146 mg/dL (ref 0–200)
HDL: 45 mg/dL (ref >39)
LDL CALC: 71 mg/dL (ref 0–99)
Total CHOL/HDL Ratio: 3.2 RATIO
Triglycerides: 151 mg/dL — ABNORMAL HIGH (ref <150)
VLDL: 30 mg/dL (ref 0–40)

## 2013-12-26 MED ORDER — METOPROLOL TARTRATE 1 MG/ML IV SOLN
INTRAVENOUS | Status: AC
  Administered 2013-12-26: 10 mg via INTRAVENOUS
  Filled 2013-12-26: qty 5

## 2013-12-26 MED ORDER — NICOTINE 21 MG/24HR TD PT24: 21.0000 mg | MEDICATED_PATCH | Freq: Every day | TRANSDERMAL | Status: DC

## 2013-12-26 MED ORDER — METOPROLOL TARTRATE 1 MG/ML IV SOLN
10.0000 mg | Freq: Once | INTRAVENOUS | Status: AC
  Administered 2013-12-26: 10 mg via INTRAVENOUS
  Filled 2013-12-26: qty 10

## 2013-12-26 MED ORDER — IOHEXOL 350 MG/ML SOLN
100.0000 mL | Freq: Once | INTRAVENOUS | Status: AC | PRN
  Administered 2013-12-26: 80 mL via INTRAVENOUS

## 2013-12-26 MED ORDER — NITROGLYCERIN 0.4 MG SL SUBL
0.4000 mg | SUBLINGUAL_TABLET | Freq: Once | SUBLINGUAL | Status: AC
  Administered 2013-12-26: 0.4 mg via SUBLINGUAL
  Filled 2013-12-26: qty 1

## 2013-12-26 NOTE — Progress Notes (Signed)
Patient Name: [name] Date of Encounter: 12/26/2013     Active Problems:   Chest pain at rest   Chronic pain disorder   Angina pectoris   Chest pain due to psychological stress    SUBJECTIVE  The patient has had no further chest discomfort.  Rhythm is stable normal sinus rhythm 85 per minute.  2-D echo not yet done.  CURRENT MEDS . ALPRAZolam  1 mg Oral TID  . aspirin  81 mg Oral Daily  . cyclobenzaprine  10-20 mg Oral QHS  . divalproex  1,500 mg Oral QPM  . docusate sodium  100 mg Oral BID  . DULoxetine  60 mg Oral Daily  . enoxaparin (LOVENOX) injection  40 mg Subcutaneous Q24H  . gabapentin  300 mg Oral TID  . nicotine  21 mg Transdermal Daily  . sodium chloride  3 mL Intravenous Q12H  . sodium chloride  3 mL Intravenous Q12H    OBJECTIVE  Filed Vitals:   12/25/13 0618 12/25/13 0806 12/25/13 1443 12/25/13 2052  BP: 102/66 131/83 122/79 128/65  Pulse: 73 82 71 79  Temp: 97.9 F (36.6 C)  97.9 F (36.6 C) 98.6 F (37 C)  TempSrc: Oral  Oral Oral  Resp: 18  19 20   Height:      Weight:      SpO2: 96%  95% 95%    Intake/Output Summary (Last 24 hours) at 12/26/13 0737 Last data filed at 12/25/13 1826  Gross per 24 hour  Intake    360 ml  Output      0 ml  Net    360 ml   Filed Weights   12/24/13 2249 12/25/13 0121  Weight: 261 lb (118.389 kg) 243 lb 4.8 oz (110.36 kg)    PHYSICAL EXAM  General: Pleasant, NAD. Neuro: Alert and oriented X 3. Moves all extremities spontaneously. Psych: Normal affect. HEENT:  Normal  Neck: Supple without bruits or JVD. Lungs:  Resp regular and unlabored, CTA. Heart: RRR no s3, s4, or murmurs. Abdomen: Soft, non-tender, non-distended, BS + x 4.  Extremities: No clubbing, cyanosis or edema. DP/PT/Radials 2+ and equal bilaterally.  Accessory Clinical Findings  CBC  Recent Labs  12/24/13 2303 12/25/13 0810  WBC 8.2 8.7  HGB 14.3 12.9  HCT 41.0 38.2  MCV 82.7 83.8  PLT 352 628   Basic Metabolic  Panel  Recent Labs  12/24/13 2303 12/25/13 0810  NA 139 143  K 3.6* 3.4*  CL 99 103  CO2 22 26  GLUCOSE 82 94  BUN 9 13  CREATININE 0.85 0.90  CALCIUM 10.3 9.5  MG  --  2.2  PHOS  --  3.9   Liver Function Tests  Recent Labs  12/25/13 0810  AST 19  ALT 13  ALKPHOS 76  BILITOT 0.4  PROT 6.9  ALBUMIN 3.6   No results found for this basename: LIPASE, AMYLASE,  in the last 72 hours Cardiac Enzymes  Recent Labs  12/25/13 0810 12/25/13 1403  TROPONINI <0.30 <0.30   BNP No components found with this basename: POCBNP,  D-Dimer No results found for this basename: DDIMER,  in the last 72 hours Hemoglobin A1C  Recent Labs  12/25/13 1403  HGBA1C 5.6   Fasting Lipid Panel  Recent Labs  12/26/13 0400  CHOL 146  HDL 45  LDLCALC 71  TRIG 151*  CHOLHDL 3.2   Thyroid Function Tests  Recent Labs  12/25/13 0810  TSH 1.515    TELE  Normal sinus rhythm  ECG    Radiology/Studies  Dg Chest Port 1 View  12/24/2013   CLINICAL DATA:  Chest pain  EXAM: PORTABLE CHEST - 1 VIEW  COMPARISON:  09/17/2011  FINDINGS: The heart size and mediastinal contours are within normal limits. Both lungs are clear. The visualized skeletal structures are unremarkable.  IMPRESSION: No active disease.   Electronically Signed   By: Franchot Gallo M.D.   On: 12/24/2013 23:58    ASSESSMENT AND PLAN 1. atypical chest pain with abnormal EKG 2. tobacco abuse 3. chronic pain syndrome 4. bipolar disorder 5. Obesity 6. GERD  Plan: Will get a cardiac CT today.  Home later today if cardiac CT and 2-D echo are normal.   Signed, Darlin Coco MD

## 2013-12-26 NOTE — Discharge Summary (Signed)
Physician Discharge Summary  [name] [id number] DOB: March 25, 1966 DOA: 12/24/2013  PCP: PROVIDER NOT IN SYSTEM  Admit date: 12/24/2013 Discharge date: 12/26/2013  Time spent: 45 minutes  Recommendations for Outpatient Follow-up:  1. PCP in 1 week  Discharge Diagnoses:  Active Problems:   Chest pain at rest   Chronic pain disorder   Tobacco use disorder    Discharge Condition: stable  Diet recommendation: heart healthy  Filed Weights   12/24/13 2249 12/25/13 0121  Weight: 118.389 kg (261 lb) 110.36 kg (243 lb 4.8 oz)    History of present illness:  [name] is a 48 y.o. female  has a past medical history of Fibromyalgia; Bipolar 1 disorder; and Spinal stenosis.  Patient states she ran out her norco and xanax. Reports chest pain that started this evening while she was down in Pediatric ER. She describes sensation as a pressure on her chest lasting only for a few hours. The pain improved with nitroglycerine. Currently she is chest pain free patient states she pain in the end felt more like indigestion. She had some shortness of breath associated with that. Cardiac enzymes are negative but ECG was worrisome for T wave inversions in lateral and inferior leads.  Hospital Course:  47/F with HTN, heavy smoker, Fibromyalgia syndrome admitted 3/15  with Chest Pressure " Brick sitting on my chest" , resolved with SL Nitro, now chest pain free.  EKG with TWI in anterior leads unchanged from before  Cycle cardiac enzymes negative Seen by Cardiology in consultation, Cardiac CT negative 2D ECHO completed and normal too. Discharged home in stable condition, advised smoking cessation   Procedures: Cardiac CT:  IMPRESSION: 1) Calcium Score 0 2) Normal right dominant coronary arteries 3) Ascending Aorta normal 3.5 cm  ECHO : Left ventricle: The cavity size was normal. Systolic function was normal. The estimated ejection fraction was in the range of 55% to 65%. Wall motion  was normal; there were no regional wall motion abnormalities.   Consultations:  Cardiology  Discharge Exam: Filed Vitals:   12/25/13 2052  BP: 128/65  Pulse: 79  Temp: 98.6 F (37 C)  Resp: 20    General: AAOx3 Cardiovascular: S1S2/RRR Respiratory: CTAB  Discharge Instructions  Discharge Orders   Future Appointments Provider Department Dept Phone   01/23/2014 10:40 AM Cpr-Prma Nurse [facility] [phone]   02/20/2014 11:20 AM Meredith Staggers, MD [facility] [phone]   Future Orders Complete By Expires   Diet - low sodium heart healthy  As directed        Medication List         alprazolam 2 MG tablet  Commonly known as:  XANAX  Take 1 mg by mouth 3 (three) times daily.     aspirin 81 MG chewable tablet  Chew 81 mg by mouth daily.     cyclobenzaprine 10 MG tablet  Commonly known as:  FLEXERIL  Take 1-2 tablets (10-20 mg total) by mouth at bedtime.     divalproex 500 MG 24 hr tablet  Commonly known as:  DEPAKOTE ER  Take 1,500 mg by mouth every evening.     DULoxetine 60 MG capsule  Commonly known as:  CYMBALTA  Take 1 capsule (60 mg total) by mouth daily.     gabapentin 300 MG capsule  Commonly known as:  NEURONTIN  Take 1 capsule (300 mg total) by mouth 3 (three) times daily.     HYDROcodone-acetaminophen 10-325  MG per tablet  Commonly known as:  NORCO  Take 1 tablet by mouth every 8 (eight) hours as needed.     lidocaine 5 % ointment  Commonly known as:  XYLOCAINE  Apply 1 application topically 4 (four) times daily as needed. 1 month supply     nicotine 21 mg/24hr patch  Commonly known as:  NICODERM CQ - dosed in mg/24 hours  Place 1 patch (21 mg total) onto the skin daily.       Allergies  Allergen Reactions  . Abilify [Aripiprazole] Other (See Comments)    Causes body shaking and tremors  . Latuda [Lurasidone Hcl] Other (See Comments)    Causes body shaking  and tremors  . Naproxen Anaphylaxis       Follow-up Information   Follow up with PCP . Schedule an appointment as soon as possible for a visit in 1 week.       The results of significant diagnostics from this hospitalization (including imaging, microbiology, ancillary and laboratory) are listed below for reference.    Significant Diagnostic Studies: Ct Cardiac Morph/pulm Vein W/cm&w/o Ca Score  12/26/2013   CLINICAL DATA:  Chest pain  EXAM: Cardiac CTA  MEDICATIONS: Sub lingual nitro. 4mg  and lopressor 10mg  iv  TECHNIQUE: The patient was scanned on a Philips 509 slice scanner. Gantry rotation speed was 270 msecs. Collimation was .34mm. A 100 kV prospective scan was triggered in the descending thoracic aorta at 111 HU's with 5% padding centered around 78% of the R-R interval. Average HR during the scan was 67 bpm. The 3D data set was interpreted on a dedicated work station using MPR, MIP and VRT modes. A total of 80cc of contrast was used.  FINDINGS: Non-cardiac: See separate report from [facility]. No significant findings on limited lung and soft tissue windows.  Calcium Score:  0  Coronary Arteries: Right dominant with no anomalies  LM: Normal  LAD:  Normal  IM:  Small vessel normal  D1: Normal  D2: Normal  Circumflex: Normal  OM1: Normal  OM2:  Continuation AV groove normal  RCA:  Dominant and normal  PDA: Normal  PLA:  Normal  IMPRESSION: 1) Calcium Score 0  2) Normal right dominant coronary arteries  3) Ascending Aorta normal 3.5 cm  Jenkins Rouge   Electronically Signed   By: Jenkins Rouge M.D.   On: 12/26/2013 09:51   Dg Chest Port 1 View  12/24/2013   CLINICAL DATA:  Chest pain  EXAM: PORTABLE CHEST - 1 VIEW  COMPARISON:  09/17/2011  FINDINGS: The heart size and mediastinal contours are within normal limits. Both lungs are clear. The visualized skeletal structures are unremarkable.  IMPRESSION: No active disease.   Electronically Signed   By: Franchot Gallo M.D.   On: 12/24/2013  23:58    Microbiology: No results found for this or any previous visit (from the past 240 hour(s)).   Labs: Basic Metabolic Panel:  Recent Labs Lab 12/24/13 2303 12/25/13 0810  NA 139 143  K 3.6* 3.4*  CL 99 103  CO2 22 26  GLUCOSE 82 94  BUN 9 13  CREATININE 0.85 0.90  CALCIUM 10.3 9.5  MG  --  2.2  PHOS  --  3.9   Liver Function Tests:  Recent Labs Lab 12/25/13 0810  AST 19  ALT 13  ALKPHOS 76  BILITOT 0.4  PROT 6.9  ALBUMIN 3.6   No results found for this basename: LIPASE, AMYLASE,  in the last  168 hours No results found for this basename: AMMONIA,  in the last 168 hours CBC:  Recent Labs Lab 12/24/13 2303 12/25/13 0810  WBC 8.2 8.7  HGB 14.3 12.9  HCT 41.0 38.2  MCV 82.7 83.8  PLT 352 292   Cardiac Enzymes:  Recent Labs Lab 12/25/13 0810 12/25/13 1403  TROPONINI <0.30 <0.30   BNP: BNP (last 3 results) No results found for this basename: PROBNP,  in the last 8760 hours CBG: No results found for this basename: GLUCAP,  in the last 168 hours     Signed:  Nashley Cordoba  [facility] 12/26/2013, 2:43 PM

## 2013-12-26 NOTE — Progress Notes (Signed)
Echocardiogram 2D Echocardiogram has been performed.  Joelene Millin 12/26/2013, 1:29 PM

## 2013-12-26 NOTE — Progress Notes (Signed)
Pt d/c home.  A&O x4.  No c/o pain.  IVs d/c'd.  Tele D/C'd.  Education given to pt on meds, diet, activity, and follow-up care and appointments.  Pt verbalized understanding.

## 2014-01-04 ENCOUNTER — Other Ambulatory Visit: Payer: Self-pay | Admitting: Physical Medicine & Rehabilitation

## 2014-01-23 ENCOUNTER — Encounter: Payer: Medicare HMO | Admitting: Registered Nurse

## 2014-01-23 ENCOUNTER — Telehealth: Payer: Self-pay

## 2014-01-23 NOTE — Telephone Encounter (Signed)
Patient had to cancel her appointment due to being sick.  She will be out of her medication by the 20th.  Patient would like to know if she can just come in to get her rx.  Spoke with patient she has an appointment 01/25/2014.  She has fallen out of her bed and has a bruise on her breast.

## 2014-01-25 ENCOUNTER — Encounter: Payer: Self-pay | Admitting: Physical Medicine & Rehabilitation

## 2014-01-25 ENCOUNTER — Encounter: Payer: Medicare HMO | Attending: Physical Medicine and Rehabilitation | Admitting: Physical Medicine & Rehabilitation

## 2014-01-25 VITALS — BP 121/97 | HR 95 | Resp 14 | Ht 63.0 in | Wt 252.0 lb

## 2014-01-25 DIAGNOSIS — M19012 Primary osteoarthritis, left shoulder: Secondary | ICD-10-CM

## 2014-01-25 DIAGNOSIS — M75102 Unspecified rotator cuff tear or rupture of left shoulder, not specified as traumatic: Secondary | ICD-10-CM

## 2014-01-25 DIAGNOSIS — G4700 Insomnia, unspecified: Secondary | ICD-10-CM

## 2014-01-25 DIAGNOSIS — M1710 Unilateral primary osteoarthritis, unspecified knee: Secondary | ICD-10-CM

## 2014-01-25 DIAGNOSIS — IMO0001 Reserved for inherently not codable concepts without codable children: Secondary | ICD-10-CM | POA: Insufficient documentation

## 2014-01-25 DIAGNOSIS — M170 Bilateral primary osteoarthritis of knee: Secondary | ICD-10-CM

## 2014-01-25 DIAGNOSIS — G609 Hereditary and idiopathic neuropathy, unspecified: Secondary | ICD-10-CM

## 2014-01-25 DIAGNOSIS — G2589 Other specified extrapyramidal and movement disorders: Secondary | ICD-10-CM

## 2014-01-25 DIAGNOSIS — M19019 Primary osteoarthritis, unspecified shoulder: Secondary | ICD-10-CM | POA: Insufficient documentation

## 2014-01-25 DIAGNOSIS — M47817 Spondylosis without myelopathy or radiculopathy, lumbosacral region: Secondary | ICD-10-CM | POA: Insufficient documentation

## 2014-01-25 DIAGNOSIS — M12812 Other specific arthropathies, not elsewhere classified, left shoulder: Secondary | ICD-10-CM

## 2014-01-25 DIAGNOSIS — IMO0002 Reserved for concepts with insufficient information to code with codable children: Secondary | ICD-10-CM

## 2014-01-25 MED ORDER — HYDROCODONE-ACETAMINOPHEN 10-325 MG PO TABS: 1.0000 | ORAL_TABLET | Freq: Three times a day (TID) | ORAL | Status: DC | PRN

## 2014-01-25 NOTE — Patient Instructions (Signed)
PLEASE CALL ME WITH ANY PROBLEMS OR QUESTIONS (#[phone]).     WORK ON REGULAR EXERCISE ----EVERY DAY!!!!!!

## 2014-01-25 NOTE — Progress Notes (Signed)
Subjective:    Patient ID: [name], female    DOB: [birth year], 48 y.o.   MRN: [id number]  HPI  Mrs. Massaro is back regarding her chronic pain. She has had a cold with cough/congestion for about a week.   Saturday a slat broke in her bed while she was sitting on the edge and the bed flipped up on her.   The last time I saw her in December I injected her in left subacromial space which helped her shoulder for 2-3 weeks.    Pain Inventory Average Pain 7 Pain Right Now 6 My pain is burning, stabbing, tingling and aching  In the last 24 hours, has pain interfered with the following? General activity 3 Relation with others 6 Enjoyment of life 5 What TIME of day is your pain at its worst? morning Sleep (in general) Poor  Pain is worse with: walking, sitting, standing and some activites Pain improves with: rest and pacing activities Relief from Meds: 6  Mobility walk without assistance how many minutes can you walk? 1 hour ability to climb steps?  yes do you drive?  yes transfers alone Do you have any goals in this area?  yes  Function disabled: date disabled na I need assistance with the following:  meal prep, household duties and shopping  Neuro/Psych numbness tingling trouble walking anxiety  Prior Studies Any changes since last visit?  no  Physicians involved in your care Any changes since last visit?  no   Family History  Problem Relation Age of Onset  . Diabetes Other   . Hypertension Other   . Schizophrenia Mother   . Hypertension Mother   . Stroke Father    History   Social History  . Marital Status: Divorced    Spouse Name: N/A    Number of Children: N/A  . Years of Education: N/A   Social History Main Topics  . Smoking status: Current Every Day Smoker -- 0.50 packs/day    Types: Cigarettes  . Smokeless tobacco: Never Used  . Alcohol Use: No  . Drug Use: No  . Sexual Activity: None   Other Topics Concern  . None   Social  History Narrative  . None   Past Surgical History  Procedure Laterality Date  . Abdominal hysterectomy    . Arthroscopic knee    . Cardiovascular stress test  03/2010    Lexiscan Myoview: EF 55%, mild breast attenuation, no ischemia or scar  . Transthoracic echocardiogram  03/2010    EF 50-55%, normal LV size, no WMAs   Past Medical History  Diagnosis Date  . Fibromyalgia   . Bipolar 1 disorder   . Spinal stenosis   . Osteoarthritis   . Hypertension   . Obesity   . GERD (gastroesophageal reflux disease)   . PUD (peptic ulcer disease)   . Anxiety    BP 121/97  Pulse 95  Resp 14  Ht 5\' 3"  (1.6 m)  Wt 252 lb (114.306 kg)  BMI 44.65 kg/m2  SpO2 99%  Opioid Risk Score:   Fall Risk Score: Moderate Fall Risk (6-13 points) (pt educated and given brochure on fall risk previously)    Review of Systems  Musculoskeletal: Positive for back pain and gait problem.  Neurological: Positive for numbness.       Tingling  Psychiatric/Behavioral: The patient is nervous/anxious.   All other systems reviewed and are negative.      Objective:   Physical Exam General: Alert and oriented  x 3, No apparent distress. obese  HEENT: Head is normocephalic, atraumatic, PERRLA, EOMI, sclera anicteric, oral mucosa pink and moist, dentition intact, ext ear canals clear,  Neck: Supple without JVD or lymphadenopathy  Heart: Reg rate and rhythm. No murmurs rubs or gallops  Chest: CTA bilaterally without wheezes, rales, or rhonchi; no distress  Abdomen: Soft, non-tender, non-distended, bowel sounds positive.  Extremities: No clubbing, cyanosis, or edema. Pulses are 2+  Skin: Clean and intact without signs of breakdown. Numerous tattoos noted.  Neuro: Pt is cognitively appropriate with normal insight, memory, and awareness. Cranial nerves 2-12 are intact. Sensory exam is normal. Reflexes are 2+ in all 4's except for the achilles which were trace to 1+. Fine motor coordination is intact. No tremors.  Motor function is grossly 5/5 except for inhibition at the left shoulder.  Musculoskeletal: Left shoulder pain with ER, IR and abduction. She could not abduct the shoulder past 45 degrees before she had substantial pain--pt resisted movement. Low back was tender to palpation in the lower lumbar spine segments, paritcularly L3-S1. She was able to bend to about 45 degrees and extend to 20 degrees with extension causes more pain.  She had generalized tenderness along the left elbow, both greater trochs, traps, sub-occipital areas, . Mild crepitus right knee and moderate on the left. She had medial joint line pain right knee and med/lat joint line pain on left. Meniscal maneuvers provoked pain in the medial right knee and med/lat on the left.   Psych:Affect was  pleasant and appropriate.   Assessment & Plan:   1. Fibromyalgia syndrome  2. Osteoarthritis of bilateral knees  3. 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 Bipolar syndrome  5. Lumbar spondylosis ?facet arthropathy  6. Insomnia    Plan:  1. Continue with regular exercise and stretching at home.  2. Continue wrist spints, ice bilaterally. Pacing with activities 3. Continue gabapentin 300mg  TID  4. Cymbalta---continue for mood and pain effects. I believe this has helped  5. Hydrocodone for breakthrough pain was RF #80  6. Reviewed appropriate shoeware as it pertains to exercise, shock absorption 7. Flexeril for muscle spasms and sleep.  .  8. Follow up with my NP in 1 months. 30 minutes of face to face patient care time were spent during this visit. All questions were encouraged and answered. I

## 2014-02-20 ENCOUNTER — Encounter: Payer: Medicare HMO | Admitting: Registered Nurse

## 2014-02-20 ENCOUNTER — Ambulatory Visit: Payer: Medicare HMO | Admitting: Physical Medicine & Rehabilitation

## 2014-02-24 ENCOUNTER — Encounter: Payer: Medicare HMO | Attending: Physical Medicine and Rehabilitation | Admitting: Registered Nurse

## 2014-02-24 ENCOUNTER — Encounter: Payer: Self-pay | Admitting: Registered Nurse

## 2014-02-24 VITALS — BP 125/98 | HR 105 | Resp 14 | Ht 62.0 in | Wt 261.0 lb

## 2014-02-24 DIAGNOSIS — M19019 Primary osteoarthritis, unspecified shoulder: Secondary | ICD-10-CM | POA: Insufficient documentation

## 2014-02-24 DIAGNOSIS — M47817 Spondylosis without myelopathy or radiculopathy, lumbosacral region: Secondary | ICD-10-CM | POA: Insufficient documentation

## 2014-02-24 DIAGNOSIS — M19012 Primary osteoarthritis, left shoulder: Secondary | ICD-10-CM

## 2014-02-24 DIAGNOSIS — IMO0001 Reserved for inherently not codable concepts without codable children: Secondary | ICD-10-CM | POA: Insufficient documentation

## 2014-02-24 DIAGNOSIS — Z79899 Other long term (current) drug therapy: Secondary | ICD-10-CM

## 2014-02-24 DIAGNOSIS — M1710 Unilateral primary osteoarthritis, unspecified knee: Secondary | ICD-10-CM | POA: Insufficient documentation

## 2014-02-24 DIAGNOSIS — IMO0002 Reserved for concepts with insufficient information to code with codable children: Secondary | ICD-10-CM | POA: Insufficient documentation

## 2014-02-24 DIAGNOSIS — G609 Hereditary and idiopathic neuropathy, unspecified: Secondary | ICD-10-CM

## 2014-02-24 DIAGNOSIS — Z5181 Encounter for therapeutic drug level monitoring: Secondary | ICD-10-CM

## 2014-02-24 DIAGNOSIS — M75102 Unspecified rotator cuff tear or rupture of left shoulder, not specified as traumatic: Secondary | ICD-10-CM

## 2014-02-24 DIAGNOSIS — M170 Bilateral primary osteoarthritis of knee: Secondary | ICD-10-CM

## 2014-02-24 DIAGNOSIS — M12812 Other specific arthropathies, not elsewhere classified, left shoulder: Secondary | ICD-10-CM

## 2014-02-24 MED ORDER — TRAZODONE HCL 50 MG PO TABS
50.0000 mg | ORAL_TABLET | Freq: Every day | ORAL | Status: DC
Start: 2014-02-24 — End: 2015-04-03

## 2014-02-24 MED ORDER — HYDROCODONE-ACETAMINOPHEN 10-325 MG PO TABS: 1.0000 | ORAL_TABLET | Freq: Three times a day (TID) | ORAL | Status: DC | PRN

## 2014-02-24 NOTE — Progress Notes (Signed)
Subjective:    Patient ID: [name], female    DOB: 02-07-1966, 48 y.o.   MRN: [id number]  HPI: [name] is a 48 year old female who returns for follow up for chronic pain and medication refill. She says her pain is located in her left shoulder and lower back. She rates her pain 7. Her current exercise regime is walking. She has insomnia only been sleeping 4 hours a night, we will start trazodone. Pain Inventory Average Pain 7 Pain Right Now 7 My pain is sharp, burning, stabbing, tingling and aching  In the last 24 hours, has pain interfered with the following? General activity 4 Relation with others 6 Enjoyment of life 5 What TIME of day is your pain at its worst? morning, night Sleep (in general) Poor  Pain is worse with: walking, bending, sitting, standing and some activites Pain improves with: rest, medication and injections Relief from Meds: 6  Mobility walk without assistance how many minutes can you walk? 30 ability to climb steps?  yes do you drive?  yes transfers alone Do you have any goals in this area?  yes  Function disabled: date disabled na Do you have any goals in this area?  yes  Neuro/Psych numbness tingling  Prior Studies Any changes since last visit?  no  Physicians involved in your care Any changes since last visit?  no   Family History  Problem Relation Age of Onset  . Diabetes Other   . Hypertension Other   . Schizophrenia Mother   . Hypertension Mother   . Stroke Father    History   Social History  . Marital Status: Divorced    Spouse Name: N/A    Number of Children: N/A  . Years of Education: N/A   Social History Main Topics  . Smoking status: Current Every Day Smoker -- 0.50 packs/day    Types: Cigarettes  . Smokeless tobacco: Never Used  . Alcohol Use: No  . Drug Use: No  . Sexual Activity: None   Other Topics Concern  . None   Social History Narrative  . None   Past Surgical History  Procedure  Laterality Date  . Abdominal hysterectomy    . Arthroscopic knee    . Cardiovascular stress test  03/2010    Lexiscan Myoview: EF 55%, mild breast attenuation, no ischemia or scar  . Transthoracic echocardiogram  03/2010    EF 50-55%, normal LV size, no WMAs   Past Medical History  Diagnosis Date  . Fibromyalgia   . Bipolar 1 disorder   . Spinal stenosis   . Osteoarthritis   . Hypertension   . Obesity   . GERD (gastroesophageal reflux disease)   . PUD (peptic ulcer disease)   . Anxiety    BP 125/98  Pulse 105  Resp 14  Ht 5\' 2"  (1.575 m)  Wt 261 lb (118.389 kg)  BMI 47.73 kg/m2  SpO2 97%  Opioid Risk Score:   Fall Risk Score: Moderate Fall Risk (6-13 points) (pt educated on fall risk, brochure given to pt previously)    Review of Systems  Constitutional: Positive for unexpected weight change.  Respiratory: Positive for cough.   Musculoskeletal: Positive for back pain.  Neurological: Positive for numbness.       Tingling  All other systems reviewed and are negative.      Objective:   Physical Exam  Nursing note and vitals reviewed. Constitutional: She is oriented to person, place, and time. She  appears well-developed and well-nourished.  HENT:  Head: Normocephalic and atraumatic.  Neck: Normal range of motion. Neck supple.  Cardiovascular: Normal rate and regular rhythm.   Pulmonary/Chest: Effort normal and breath sounds normal.  Musculoskeletal:  Norma Muscle Bulk and Muscle Testing Reveals: Upper Extremities: Right Arm : Full ROM and Muscle Strength 5/5: Left Arm: Decreased ROM with abduction and adduction, 45 degrees. AC-Joint Tenderness, Deltoid Muscle Tenderness. Spine of Scapula Tenderness. Lower Extremities: Bilateral Knees Flexion produces pain into patella. No tenderness with palpation or swelling noted. Arises from chair with ease. Narrow Based Gait.  Neurological: She is alert and oriented to person, place, and time.  Skin: Skin is warm and dry.    Psychiatric: She has a normal mood and affect.          Assessment & Plan:  1. Fibromyalgia Syndrome: Continue with exercise and heat therapy. 2. Osteoarthritis of Bilateral Knees: Continue with lidocaine ointment, heat and exercise therapy. 3. Left Shoulder OA: Continue with Exercise and heat therapy and Medication regime. 4. Bipolar Syndrome: On Depakote and Cymbalta. 5. Lumbar Spondylosis: Refilled: Hydrocodone 10/325mg  one tablet every 8 hours as needed #80. 6. Insomnia: RX: Trazodone 50 mg HS #30.  20 minutes of face to face patient care time was spent during this visit. All questions were encouraged and answered.  F/U in 1 month

## 2014-02-24 NOTE — Patient Instructions (Signed)
Instructed to start Trazodone for Insomnia start with 1/2 tablet if unable to sleep. Start with a whole tablet.

## 2014-03-01 ENCOUNTER — Other Ambulatory Visit: Payer: Self-pay | Admitting: Physical Medicine & Rehabilitation

## 2014-03-29 ENCOUNTER — Encounter: Payer: Medicare HMO | Attending: Physical Medicine and Rehabilitation | Admitting: Registered Nurse

## 2014-03-29 ENCOUNTER — Encounter: Payer: Self-pay | Admitting: Registered Nurse

## 2014-03-29 VITALS — BP 141/83 | HR 95 | Resp 14 | Ht 62.0 in | Wt 265.0 lb

## 2014-03-29 DIAGNOSIS — IMO0002 Reserved for concepts with insufficient information to code with codable children: Secondary | ICD-10-CM

## 2014-03-29 DIAGNOSIS — M12812 Other specific arthropathies, not elsewhere classified, left shoulder: Secondary | ICD-10-CM

## 2014-03-29 DIAGNOSIS — G609 Hereditary and idiopathic neuropathy, unspecified: Secondary | ICD-10-CM

## 2014-03-29 DIAGNOSIS — M75102 Unspecified rotator cuff tear or rupture of left shoulder, not specified as traumatic: Secondary | ICD-10-CM

## 2014-03-29 DIAGNOSIS — M47817 Spondylosis without myelopathy or radiculopathy, lumbosacral region: Secondary | ICD-10-CM | POA: Diagnosis not present

## 2014-03-29 DIAGNOSIS — M19019 Primary osteoarthritis, unspecified shoulder: Secondary | ICD-10-CM | POA: Insufficient documentation

## 2014-03-29 DIAGNOSIS — M1710 Unilateral primary osteoarthritis, unspecified knee: Secondary | ICD-10-CM | POA: Diagnosis not present

## 2014-03-29 DIAGNOSIS — M170 Bilateral primary osteoarthritis of knee: Secondary | ICD-10-CM

## 2014-03-29 DIAGNOSIS — Z79899 Other long term (current) drug therapy: Secondary | ICD-10-CM

## 2014-03-29 DIAGNOSIS — IMO0001 Reserved for inherently not codable concepts without codable children: Secondary | ICD-10-CM

## 2014-03-29 DIAGNOSIS — M19012 Primary osteoarthritis, left shoulder: Secondary | ICD-10-CM

## 2014-03-29 DIAGNOSIS — Z5181 Encounter for therapeutic drug level monitoring: Secondary | ICD-10-CM

## 2014-03-29 MED ORDER — HYDROCODONE-ACETAMINOPHEN 10-325 MG PO TABS: 1.0000 | ORAL_TABLET | Freq: Three times a day (TID) | ORAL | Status: DC | PRN

## 2014-03-29 MED ORDER — METHYLPREDNISOLONE 4 MG PO KIT: PACK | ORAL | Status: DC

## 2014-03-29 NOTE — Progress Notes (Signed)
Subjective:    Patient ID: [name], female    DOB: 09/24/1966, 48 y.o.   MRN: [id number]  HPI: [name] is a 48 year old female who returns for follow up for chronic pain and medication refill. She says her pain is located in her left shoulder, lower back and bilateral knees. She rates her pain 8. Her current exercise regime is walking. She was looking for a cortisone injection today. Having increase intensity of pain for the last two weeks in her left shoulder due to the fact she was helping her daughter move. She's unable to receive cortisone injection today. Medrol dose pak given. She wasn't able to receive Toradol due to allergies.  Pain Inventory Average Pain 8 Pain Right Now 8 My pain is burning, stabbing and aching  In the last 24 hours, has pain interfered with the following? General activity 4 Relation with others 4 Enjoyment of life 5 What TIME of day is your pain at its worst? morning, evening Sleep (in general) Poor  Pain is worse with: walking, sitting and standing Pain improves with: rest, heat/ice and medication Relief from Meds: 6  Mobility walk without assistance how many minutes can you walk? 15 ability to climb steps?  no do you drive?  yes transfers alone  Function disabled: date disabled na Do you have any goals in this area?  no  Neuro/Psych No problems in this area  Prior Studies Any changes since last visit?  yes CT/MRI  Physicians involved in your care Any changes since last visit?  no   Family History  Problem Relation Age of Onset  . Diabetes Other   . Hypertension Other   . Schizophrenia Mother   . Hypertension Mother   . Stroke Father    History   Social History  . Marital Status: Divorced    Spouse Name: N/A    Number of Children: N/A  . Years of Education: N/A   Social History Main Topics  . Smoking status: Current Every Day Smoker -- 0.50 packs/day    Types: Cigarettes  . Smokeless tobacco: Never Used  .  Alcohol Use: No  . Drug Use: No  . Sexual Activity: None   Other Topics Concern  . None   Social History Narrative  . None   Past Surgical History  Procedure Laterality Date  . Abdominal hysterectomy    . Arthroscopic knee    . Cardiovascular stress test  03/2010    Lexiscan Myoview: EF 55%, mild breast attenuation, no ischemia or scar  . Transthoracic echocardiogram  03/2010    EF 50-55%, normal LV size, no WMAs   Past Medical History  Diagnosis Date  . Fibromyalgia   . Bipolar 1 disorder   . Spinal stenosis   . Osteoarthritis   . Hypertension   . Obesity   . GERD (gastroesophageal reflux disease)   . PUD (peptic ulcer disease)   . Anxiety    BP 141/83  Pulse 95  Resp 14  Ht 5\' 2"  (1.575 m)  Wt 265 lb (120.203 kg)  BMI 48.46 kg/m2  SpO2 96%  Opioid Risk Score:   Fall Risk Score: Moderate Fall Risk (6-13 points) (pt educated on fall risk, brochure given to pt previously)    Review of Systems  Constitutional: Positive for unexpected weight change.  Musculoskeletal: Positive for back pain.  All other systems reviewed and are negative.      Objective:   Physical Exam  Nursing note and  vitals reviewed. Constitutional: She is oriented to person, place, and time. She appears well-developed and well-nourished.  HENT:  Head: Normocephalic and atraumatic.  Neck: Normal range of motion. Neck supple.  Cardiovascular: Normal rate, regular rhythm and normal heart sounds.   Pulmonary/Chest: Effort normal and breath sounds normal.  Musculoskeletal:  Normal Muscle Bulk and Muscle Testing Reveals: Upper Extremities: Right Arm Full ROM and Muscle Strength 5/5 Left Arm: Decreased ROM 80 degrees, Muscle strength 5/5. Left AC Joint Tenderness Noted And tenderness Deltoid Muscle Lower Extremities: Full ROM and Muscle Strength 5/5 Left Leg Flexion Produces Pain into Patella. No Swelling or Tenderness Noted with Palpation. Arises from chair with ease Narrow Based Gait     Neurological: She is alert and oriented to person, place, and time.  Skin: Skin is warm and dry.  Psychiatric: She has a normal mood and affect.          Assessment & Plan:  1. Fibromyalgia Syndrome: Continue with exercise and heat therapy.  2. Osteoarthritis of Bilateral Knees: Continue with lidocaine ointment, heat and exercise therapy.  3. Left Shoulder OA: Continue with Exercise and heat therapy and Medication regime.  4. Bipolar Syndrome: On Depakote and Cymbalta.  5. Lumbar Spondylosis: Refilled: Hydrocodone 10/325mg  one tablet every 8 hours as needed #80.  6. Insomnia:Trazodone not working. She feels it related to her Bipolar   20 minutes of face to face patient care time was spent during this visit. All questions were encouraged and answered.   F/U in 1 month

## 2014-03-30 ENCOUNTER — Other Ambulatory Visit: Payer: Self-pay | Admitting: Physical Medicine & Rehabilitation

## 2014-05-04 ENCOUNTER — Telehealth: Payer: Self-pay

## 2014-05-04 DIAGNOSIS — M12812 Other specific arthropathies, not elsewhere classified, left shoulder: Secondary | ICD-10-CM

## 2014-05-04 DIAGNOSIS — M47817 Spondylosis without myelopathy or radiculopathy, lumbosacral region: Secondary | ICD-10-CM

## 2014-05-04 DIAGNOSIS — M75102 Unspecified rotator cuff tear or rupture of left shoulder, not specified as traumatic: Secondary | ICD-10-CM

## 2014-05-04 DIAGNOSIS — IMO0001 Reserved for inherently not codable concepts without codable children: Secondary | ICD-10-CM

## 2014-05-04 MED ORDER — HYDROCODONE-ACETAMINOPHEN 10-325 MG PO TABS: 1.0000 | ORAL_TABLET | Freq: Three times a day (TID) | ORAL | Status: DC | PRN

## 2014-05-04 NOTE — Telephone Encounter (Signed)
Hydrocodone rx printed.  Will contact patient when signed and ready for pick up.

## 2014-05-04 NOTE — Telephone Encounter (Signed)
Patient aware hydrocodone rx is ready for pick up. 

## 2014-05-04 NOTE — Telephone Encounter (Signed)
Patient called requesting hydrocodone refill.  She has an appointment 05/09/2014.

## 2014-05-07 ENCOUNTER — Other Ambulatory Visit: Payer: Self-pay | Admitting: Physical Medicine & Rehabilitation

## 2014-05-09 ENCOUNTER — Encounter: Payer: Medicare HMO | Attending: Physical Medicine and Rehabilitation | Admitting: Physical Medicine & Rehabilitation

## 2014-05-09 ENCOUNTER — Encounter: Payer: Self-pay | Admitting: Physical Medicine & Rehabilitation

## 2014-05-09 VITALS — BP 116/72 | HR 104 | Resp 16 | Ht 62.0 in | Wt 260.0 lb

## 2014-05-09 DIAGNOSIS — M19012 Primary osteoarthritis, left shoulder: Secondary | ICD-10-CM

## 2014-05-09 DIAGNOSIS — M47817 Spondylosis without myelopathy or radiculopathy, lumbosacral region: Secondary | ICD-10-CM | POA: Insufficient documentation

## 2014-05-09 DIAGNOSIS — IMO0001 Reserved for inherently not codable concepts without codable children: Secondary | ICD-10-CM | POA: Diagnosis present

## 2014-05-09 DIAGNOSIS — M75102 Unspecified rotator cuff tear or rupture of left shoulder, not specified as traumatic: Secondary | ICD-10-CM

## 2014-05-09 DIAGNOSIS — M19019 Primary osteoarthritis, unspecified shoulder: Secondary | ICD-10-CM

## 2014-05-09 DIAGNOSIS — M12812 Other specific arthropathies, not elsewhere classified, left shoulder: Secondary | ICD-10-CM

## 2014-05-09 MED ORDER — AMITRIPTYLINE HCL 25 MG PO TABS: 25.0000 mg | ORAL_TABLET | Freq: Every day | ORAL | Status: DC

## 2014-05-09 MED ORDER — OXYMORPHONE HCL 10 MG PO TABS: 10.0000 mg | ORAL_TABLET | Freq: Three times a day (TID) | ORAL | Status: DC | PRN

## 2014-05-09 NOTE — Patient Instructions (Signed)
PLEASE CALL ME WITH ANY PROBLEMS OR QUESTIONS (#[phone]).      

## 2014-05-09 NOTE — Progress Notes (Signed)
Subjective:    Patient ID: [name], female    DOB: 1966-06-07, 48 y.o.   MRN: [id number]  HPI  Tanyla is back regarding her chronic pain. Her left shoulder has been hurting more, and she's not sleeping. Trazodone doesn't work, and the gabapentin doesn't help her general pain.   She would like a steroid injection to her left shoulder today.   Pain Inventory Average Pain 8 Pain Right Now 8 My pain is burning, stabbing, tingling and aching  In the last 24 hours, has pain interfered with the following? General activity 2 Relation with others 4 Enjoyment of life 3 What TIME of day is your pain at its worst? night Sleep (in general) Poor  Pain is worse with: walking, sitting, standing and some activites Pain improves with: rest, heat/ice and medication Relief from Meds: 5  Mobility how many minutes can you walk? 30 ability to climb steps?  yes do you drive?  yes  Function not employed: date last employed .  Neuro/Psych bladder control problems bowel control problems numbness tremor trouble walking spasms anxiety  Prior Studies Any changes since last visit?  no  Physicians involved in your care Primary care .   Family History  Problem Relation Age of Onset  . Diabetes Other   . Hypertension Other   . Schizophrenia Mother   . Hypertension Mother   . Stroke Father    History   Social History  . Marital Status: Divorced    Spouse Name: N/A    Number of Children: N/A  . Years of Education: N/A   Social History Main Topics  . Smoking status: Current Every Day Smoker -- 0.50 packs/day    Types: Cigarettes  . Smokeless tobacco: Never Used  . Alcohol Use: No  . Drug Use: No  . Sexual Activity: None   Other Topics Concern  . None   Social History Narrative  . None   Past Surgical History  Procedure Laterality Date  . Abdominal hysterectomy    . Arthroscopic knee    . Cardiovascular stress test  03/2010    Lexiscan Myoview: EF 55%, mild  breast attenuation, no ischemia or scar  . Transthoracic echocardiogram  03/2010    EF 50-55%, normal LV size, no WMAs   Past Medical History  Diagnosis Date  . Fibromyalgia   . Bipolar 1 disorder   . Spinal stenosis   . Osteoarthritis   . Hypertension   . Obesity   . GERD (gastroesophageal reflux disease)   . PUD (peptic ulcer disease)   . Anxiety    BP 116/72  Pulse 104  Resp 16  Ht 5\' 2"  (1.575 m)  Wt 260 lb (117.935 kg)  BMI 47.54 kg/m2  SpO2 100%  Opioid Risk Score:   Fall Risk Score:     Review of Systems  Gastrointestinal: Positive for diarrhea.  Musculoskeletal: Positive for arthralgias, back pain and myalgias.  Neurological: Positive for tremors, weakness and numbness.  Psychiatric/Behavioral: The patient is nervous/anxious.   All other systems reviewed and are negative.      Objective:   Physical Exam   General: Alert and oriented x 3, No apparent distress. obese  HEENT: Head is normocephalic, atraumatic, PERRLA, EOMI, sclera anicteric, oral mucosa pink and moist, dentition intact, ext ear canals clear,  Neck: Supple without JVD or lymphadenopathy  Heart: Reg rate and rhythm. No murmurs rubs or gallops  Chest: CTA bilaterally without wheezes, rales, or rhonchi; no distress  Abdomen: Soft,  non-tender, non-distended, bowel sounds positive.  Extremities: No clubbing, cyanosis, or edema. Pulses are 2+  Skin: Clean and intact without signs of breakdown. Numerous tattoos noted.  Neuro: Pt is cognitively appropriate with normal insight, memory, and awareness. Cranial nerves 2-12 are intact. Sensory exam is normal. Reflexes are 2+ in all 4's except for the achilles which were trace to 1+. Fine motor coordination is intact. No tremors. Motor function is grossly 5/5 except for inhibition at the left shoulder.  Musculoskeletal: Left shoulder pain with ER, IR and abduction. She could not abduct the shoulder past 45 degrees before she had substantial pain--pt resisted  movement. Low back was tender to palpation in the lower lumbar spine segments, paritcularly L3-S1. She was able to bend to about 45 degrees and extend to 20 degrees with extension causes more pain. She had generalized tenderness along the left elbow, both greater trochs, traps, sub-occipital areas, . Mild crepitus right knee and moderate on the left. She had medial joint line pain right knee and med/lat joint line pain on left. Meniscal maneuvers provoked pain in the medial right knee and med/lat on the left.  Psych:Affect was pleasant and appropriate.  Assessment & Plan:   1. Fibromyalgia syndrome  2. Osteoarthritis of bilateral knees  3. 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 Bipolar syndrome  5. Lumbar spondylosis ?facet arthropathy  6. Insomnia    Plan:  1. Continue with regular exercise and stretching at home.  2. After informed consent and preparation of the skin with betadine and isopropyl alcohol, I injected 6mg  (1cc) of celestone and 4cc of 1% lidocaine into the left subacromial space via lateral approach. Additionally, aspiration was performed prior to injection. The patient tolerated well, and no complications were encountered. Afterward the area was cleaned and dressed. Post- injection instructions were provided.   3. Taper instructions were given for gabapentin 300mg  TID  4. Cymbalta---continue for mood and pain effects. I believe this has helped  5. Hydrocodone--changed to opana 10mg  q8 prn #80 6. Hold trazodone---trial of elavil 25mg  qhs for sleep/pain  7. Flexeril for muscle spasms and sleep. .  8. Follow up with my NP in 1 months. 30 minutes of face to face patient care time were spent during this visit. All questions were encouraged and answered. I

## 2014-05-22 ENCOUNTER — Telehealth: Payer: Self-pay

## 2014-05-22 NOTE — Telephone Encounter (Signed)
Patient's insurance denied coverage for Oxymorphone after a PA due to patient only trying Norco. Please advise.

## 2014-05-29 NOTE — Telephone Encounter (Signed)
What WILL insurance allow [place] to try?

## 2014-05-29 NOTE — Telephone Encounter (Signed)
Contacted patient and advised her that her insurance company denied coverage for her Oxymorphone. Patient states she has her medication and insurance covered it.

## 2014-06-09 ENCOUNTER — Encounter: Payer: Medicare HMO | Admitting: Registered Nurse

## 2014-06-13 ENCOUNTER — Encounter: Payer: Self-pay | Admitting: Registered Nurse

## 2014-06-13 ENCOUNTER — Ambulatory Visit: Payer: Medicare HMO | Admitting: Registered Nurse

## 2014-06-13 ENCOUNTER — Encounter: Payer: Medicare HMO | Attending: Physical Medicine and Rehabilitation | Admitting: Registered Nurse

## 2014-06-13 VITALS — BP 114/76 | HR 92 | Resp 16 | Ht 62.0 in | Wt 260.0 lb

## 2014-06-13 DIAGNOSIS — M19019 Primary osteoarthritis, unspecified shoulder: Secondary | ICD-10-CM | POA: Insufficient documentation

## 2014-06-13 DIAGNOSIS — IMO0001 Reserved for inherently not codable concepts without codable children: Secondary | ICD-10-CM

## 2014-06-13 DIAGNOSIS — M47817 Spondylosis without myelopathy or radiculopathy, lumbosacral region: Secondary | ICD-10-CM | POA: Diagnosis present

## 2014-06-13 DIAGNOSIS — M19012 Primary osteoarthritis, left shoulder: Secondary | ICD-10-CM

## 2014-06-13 DIAGNOSIS — Z5181 Encounter for therapeutic drug level monitoring: Secondary | ICD-10-CM

## 2014-06-13 DIAGNOSIS — Z79899 Other long term (current) drug therapy: Secondary | ICD-10-CM

## 2014-06-13 DIAGNOSIS — M75102 Unspecified rotator cuff tear or rupture of left shoulder, not specified as traumatic: Secondary | ICD-10-CM

## 2014-06-13 DIAGNOSIS — M12812 Other specific arthropathies, not elsewhere classified, left shoulder: Secondary | ICD-10-CM

## 2014-06-13 MED ORDER — OXYMORPHONE HCL 10 MG PO TABS: 10.0000 mg | ORAL_TABLET | Freq: Three times a day (TID) | ORAL | Status: DC | PRN

## 2014-06-13 NOTE — Progress Notes (Signed)
Subjective:    Patient ID: [name], female    DOB: [birth year], 48 y.o.   MRN: [id number]  HPI: [name] is a 48 year old female who returns for follow up for chronic pain and medication refill. She says her pain is located in her left shoulder and her lower back. She rates her pain 5. Her current exercise regime is walking.  Pain Inventory Average Pain 7 Pain Right Now 5 My pain is burning, stabbing and aching  In the last 24 hours, has pain interfered with the following? General activity 5 Relation with others 6 Enjoyment of life 7 What TIME of day is your pain at its worst? morning Sleep (in general) Good  Pain is worse with: walking, bending, sitting and standing Pain improves with: medication Relief from Meds: 8  Mobility walk without assistance how many minutes can you walk? 30 ability to climb steps?  no do you drive?  no transfers alone Do you have any goals in this area?  no  Function Do you have any goals in this area?  no  Neuro/Psych No problems in this area  Prior Studies Any changes since last visit?  no  Physicians involved in your care Any changes since last visit?  no   Family History  Problem Relation Age of Onset  . Diabetes Other   . Hypertension Other   . Schizophrenia Mother   . Hypertension Mother   . Stroke Father    History   Social History  . Marital Status: Divorced    Spouse Name: N/A    Number of Children: N/A  . Years of Education: N/A   Social History Main Topics  . Smoking status: Current Every Day Smoker -- 0.50 packs/day    Types: Cigarettes  . Smokeless tobacco: Never Used  . Alcohol Use: No  . Drug Use: No  . Sexual Activity: None   Other Topics Concern  . None   Social History Narrative  . None   Past Surgical History  Procedure Laterality Date  . Abdominal hysterectomy    . Arthroscopic knee    . Cardiovascular stress test  03/2010    Lexiscan Myoview: EF 55%, mild breast attenuation,  no ischemia or scar  . Transthoracic echocardiogram  03/2010    EF 50-55%, normal LV size, no WMAs   Past Medical History  Diagnosis Date  . Fibromyalgia   . Bipolar 1 disorder   . Spinal stenosis   . Osteoarthritis   . Hypertension   . Obesity   . GERD (gastroesophageal reflux disease)   . PUD (peptic ulcer disease)   . Anxiety    BP 114/76  Pulse 92  Resp 16  Ht 5\' 2"  (1.575 m)  Wt 260 lb (117.935 kg)  BMI 47.54 kg/m2  SpO2 95%  Opioid Risk Score:   Fall Risk Score:      Review of Systems  Musculoskeletal: Positive for back pain and neck pain.  All other systems reviewed and are negative.      Objective:   Physical Exam  Nursing note and vitals reviewed. Constitutional: She is oriented to person, place, and time. She appears well-developed and well-nourished.  HENT:  Head: Normocephalic and atraumatic.  Neck: Normal range of motion. Neck supple.  Cardiovascular: Normal rate and regular rhythm.   Pulmonary/Chest: Effort normal and breath sounds normal.  Musculoskeletal:  Normal Muscle Bulk and Muscle testing Reveals: Upper Extremities: Full ROM and Muscle Strength 5/5 on the Right.  Left with decreased ROM 45 Degrees and Extension 20 Degree. Left Spine of Scapula Tenderness Lower extremities: Full ROM and Muscle strength 5/5 Arises from chair with ease   Neurological: She is alert and oriented to person, place, and time.  Skin: Skin is warm and dry.  Psychiatric: She has a normal mood and affect.          Assessment & Plan:  1. Fibromyalgia Syndrome: Continue with exercise and heat therapy.  2. Osteoarthritis of Bilateral Knees: Continue with lidocaine ointment, heat and exercise therapy.  3. Left Shoulder OA: Continue with Exercise and heat therapy and Medication regime.  4. Bipolar Syndrome: On Depakote and Cymbalta.  5. Lumbar Spondylosis: Refilled:  one tablet every 8 hours as needed #80.  6. Insomnia:Resolved. 20 minutes of face to face patient  care time was spent during this visit. All questions were encouraged and answered.   F/U in 1 month

## 2014-06-15 ENCOUNTER — Telehealth: Payer: Self-pay

## 2014-06-15 NOTE — Telephone Encounter (Signed)
Patient states she needed a PA on Oxymorphone. PA was submitted to insurance and was denied. Formulary alternatives include: morphine sulfate, Tramadol,Hydromorphone, Hydrocodone/ibu, Oxycodone/ibu, Oxycodone/acetaminophen,  Roxicet, endocet and Oxycodone HCI. Please advise

## 2014-06-16 MED ORDER — OXYCODONE HCL 15 MG PO TABS
15.0000 mg | ORAL_TABLET | Freq: Three times a day (TID) | ORAL | Status: DC | PRN
Start: 2014-06-16 — End: 2014-07-18

## 2014-06-16 NOTE — Telephone Encounter (Signed)
Oxycodone 15mg . rx written

## 2014-06-16 NOTE — Telephone Encounter (Signed)
Could not locate rx and I spoke with [name] and she has not gotten it.  I told her that unfortunately there is nothing we can do here in the office and we are closed on Monday or holiday.  She is out of her medication.  [name] is on call and I told her to call Saturday morning to [facility] and ask for rehab unit and have [name] paged so that he can help her.

## 2014-06-20 NOTE — Telephone Encounter (Signed)
I called Kortne back with instructions on picking up rx for oxycodone at [place] station after 10 am--no earlier.  [name] will have her rx ready for her.

## 2014-07-12 ENCOUNTER — Encounter: Payer: Medicare HMO | Admitting: Registered Nurse

## 2014-07-18 ENCOUNTER — Ambulatory Visit (HOSPITAL_COMMUNITY)
Admission: RE | Admit: 2014-07-18 | Discharge: 2014-07-18 | Disposition: A | Discharge status: Home / Self Care | Payer: Medicare HMO | Source: Ambulatory Visit | Attending: Registered Nurse | Admitting: Registered Nurse

## 2014-07-18 ENCOUNTER — Encounter: Payer: Medicare HMO | Attending: Physical Medicine and Rehabilitation | Admitting: Registered Nurse

## 2014-07-18 ENCOUNTER — Encounter: Payer: Self-pay | Admitting: Registered Nurse

## 2014-07-18 VITALS — Ht 63.0 in | Wt 275.3 lb

## 2014-07-18 DIAGNOSIS — M12812 Other specific arthropathies, not elsewhere classified, left shoulder: Secondary | ICD-10-CM

## 2014-07-18 DIAGNOSIS — M19019 Primary osteoarthritis, unspecified shoulder: Secondary | ICD-10-CM | POA: Insufficient documentation

## 2014-07-18 DIAGNOSIS — M791 Myalgia: Secondary | ICD-10-CM | POA: Diagnosis present

## 2014-07-18 DIAGNOSIS — M47817 Spondylosis without myelopathy or radiculopathy, lumbosacral region: Secondary | ICD-10-CM | POA: Diagnosis not present

## 2014-07-18 DIAGNOSIS — Z79899 Other long term (current) drug therapy: Secondary | ICD-10-CM | POA: Diagnosis present

## 2014-07-18 DIAGNOSIS — M19012 Primary osteoarthritis, left shoulder: Secondary | ICD-10-CM | POA: Diagnosis present

## 2014-07-18 DIAGNOSIS — M75102 Unspecified rotator cuff tear or rupture of left shoulder, not specified as traumatic: Secondary | ICD-10-CM

## 2014-07-18 DIAGNOSIS — M129 Arthropathy, unspecified: Secondary | ICD-10-CM | POA: Insufficient documentation

## 2014-07-18 DIAGNOSIS — M25571 Pain in right ankle and joints of right foot: Secondary | ICD-10-CM

## 2014-07-18 DIAGNOSIS — M79671 Pain in right foot: Secondary | ICD-10-CM | POA: Insufficient documentation

## 2014-07-18 DIAGNOSIS — Z5181 Encounter for therapeutic drug level monitoring: Secondary | ICD-10-CM | POA: Diagnosis present

## 2014-07-18 DIAGNOSIS — M12512 Traumatic arthropathy, left shoulder: Secondary | ICD-10-CM | POA: Diagnosis present

## 2014-07-18 DIAGNOSIS — M170 Bilateral primary osteoarthritis of knee: Secondary | ICD-10-CM

## 2014-07-18 DIAGNOSIS — M609 Myositis, unspecified: Secondary | ICD-10-CM | POA: Diagnosis present

## 2014-07-18 MED ORDER — OXYCODONE HCL 15 MG PO TABS: 15.0000 mg | ORAL_TABLET | Freq: Three times a day (TID) | ORAL | Status: DC | PRN

## 2014-07-18 NOTE — Progress Notes (Signed)
Subjective:    Patient ID: [name], female    DOB: [birth year], 48 y.o.   MRN: [id number]  HPI: [name] is a 48 year old female who returns for follow up for chronic pain and medication refill. She says her pain is located in her  lower back and right heel.  She states" she has been having excruciating pain in her right heel,  on Saturday 07/15/2014  She was trying to get out of the rain and  thinks she must have stepped on the gravel the wrong way". She woke up with excruciating pain on Sunday 07/16/2014. X-ray ordered. She rates her pain 9. She has not followed her usual exercise regime at this time due to pain. Also stated in July [name] changed her pain medications to Opana. She was able to get her medication in July. In August her insurance company would not pay for it. She was switched to Oxycodone, which has not provided her with relief, and now has been experiencing increase intensity of pain. We will send a prior authorization to her insurance company for [name]. She verbalizes understanding.  Pain Inventory Average Pain 8 Pain Right Now 9 My pain is intermittent, constant, stabbing, aching and pressure  In the last 24 hours, has pain interfered with the following? General activity 4 Relation with others 4 Enjoyment of life 4 What TIME of day is your pain at its worst? all Sleep (in general) Poor  Pain is worse with: standing and some activites Pain improves with: rest and medication Relief from Meds: 6  Mobility walk without assistance  Function disabled: date disabled .  Neuro/Psych trouble walking  Prior Studies Any changes since last visit?  no  Physicians involved in your care Any changes since last visit?  no   Family History  Problem Relation Age of Onset  . Diabetes Other   . Hypertension Other   . Schizophrenia Mother   . Hypertension Mother   . Stroke Father    History   Social History  . Marital Status: Divorced    Spouse  Name: N/A    Number of Children: N/A  . Years of Education: N/A   Social History Main Topics  . Smoking status: Current Every Day Smoker -- 0.50 packs/day    Types: Cigarettes  . Smokeless tobacco: Never Used  . Alcohol Use: No  . Drug Use: No  . Sexual Activity: None   Other Topics Concern  . None   Social History Narrative  . None   Past Surgical History  Procedure Laterality Date  . Abdominal hysterectomy    . Arthroscopic knee    . Cardiovascular stress test  03/2010    Lexiscan Myoview: EF 55%, mild breast attenuation, no ischemia or scar  . Transthoracic echocardiogram  03/2010    EF 50-55%, normal LV size, no WMAs   Past Medical History  Diagnosis Date  . Fibromyalgia   . Bipolar 1 disorder   . Spinal stenosis   . Osteoarthritis   . Hypertension   . Obesity   . GERD (gastroesophageal reflux disease)   . PUD (peptic ulcer disease)   . Anxiety    Ht 5\' 3"  (1.6 m)  Wt 275 lb 4.8 oz (124.875 kg)  BMI 48.78 kg/m2  Opioid Risk Score:   Fall Risk Score: Low Fall Risk (0-5 points)   Review of Systems     Objective:   Physical Exam  Nursing note and vitals reviewed. Constitutional: She  is oriented to person, place, and time. She appears well-developed and well-nourished.  HENT:  Head: Normocephalic and atraumatic.  Neck: Normal range of motion. Neck supple.  Cardiovascular: Normal rate and regular rhythm.   Pulmonary/Chest: Effort normal and breath sounds normal.  Musculoskeletal:  Normal Muscle Bulk and Muscle testing Reveals: Upper Extremities: Full ROM and Muscle Strength 5/5 Left Spine of Scapula Tenderness Lower Extremities: Full ROM and Muscle Strength 5/5 Left Leg Flexion Produces pain into Patella Arises from chair with ease Narrow based Gait   Neurological: She is alert and oriented to person, place, and time.  Skin: Skin is warm and dry.  Psychiatric: She has a normal mood and affect.          Assessment & Plan:  1. Fibromyalgia  Syndrome: Continue with exercise and heat therapy.  2. Osteoarthritis of Bilateral Knees: Continue with lidocaine ointment, heat and exercise therapy.  3. Left Shoulder OA: Continue with Exercise and heat therapy and Medication regime.  4. Bipolar Syndrome: On Depakote and Cymbalta.  5. Lumbar Spondylosis: Refilled:Oxycodone 15 mg one tablet every 8 hours as needed one tablet every 8 hours as needed #80.  6. Insomnia: Continue Elavil  20 minutes of face to face patient care time was spent during this visit. All questions were encouraged and answered.   F/U in 1 month

## 2014-07-19 ENCOUNTER — Telehealth: Payer: Self-pay | Admitting: Registered Nurse

## 2014-07-19 ENCOUNTER — Telehealth: Payer: Self-pay | Admitting: *Deleted

## 2014-07-19 NOTE — Telephone Encounter (Signed)
Called Ms. Aretha Parrot and reviewed her X-ray Results

## 2014-07-19 NOTE — Telephone Encounter (Signed)
Appeal faxed to [facility] for opana 10 mg #80/30 day

## 2014-08-17 ENCOUNTER — Encounter: Payer: Self-pay | Admitting: Registered Nurse

## 2014-08-17 ENCOUNTER — Encounter: Payer: Medicare HMO | Attending: Physical Medicine and Rehabilitation | Admitting: Registered Nurse

## 2014-08-17 ENCOUNTER — Other Ambulatory Visit: Payer: Self-pay | Admitting: Physical Medicine & Rehabilitation

## 2014-08-17 VITALS — BP 138/80 | HR 91 | Resp 14 | Ht 62.0 in | Wt 270.0 lb

## 2014-08-17 DIAGNOSIS — M129 Arthropathy, unspecified: Secondary | ICD-10-CM | POA: Insufficient documentation

## 2014-08-17 DIAGNOSIS — M609 Myositis, unspecified: Secondary | ICD-10-CM | POA: Insufficient documentation

## 2014-08-17 DIAGNOSIS — Z79899 Other long term (current) drug therapy: Secondary | ICD-10-CM | POA: Insufficient documentation

## 2014-08-17 DIAGNOSIS — M791 Myalgia: Secondary | ICD-10-CM | POA: Insufficient documentation

## 2014-08-17 DIAGNOSIS — M12812 Other specific arthropathies, not elsewhere classified, left shoulder: Secondary | ICD-10-CM

## 2014-08-17 DIAGNOSIS — M47817 Spondylosis without myelopathy or radiculopathy, lumbosacral region: Secondary | ICD-10-CM | POA: Diagnosis not present

## 2014-08-17 DIAGNOSIS — M25571 Pain in right ankle and joints of right foot: Secondary | ICD-10-CM

## 2014-08-17 DIAGNOSIS — Z5181 Encounter for therapeutic drug level monitoring: Secondary | ICD-10-CM | POA: Diagnosis present

## 2014-08-17 DIAGNOSIS — M12512 Traumatic arthropathy, left shoulder: Secondary | ICD-10-CM | POA: Diagnosis present

## 2014-08-17 DIAGNOSIS — M19019 Primary osteoarthritis, unspecified shoulder: Secondary | ICD-10-CM | POA: Insufficient documentation

## 2014-08-17 DIAGNOSIS — M19012 Primary osteoarthritis, left shoulder: Secondary | ICD-10-CM | POA: Diagnosis present

## 2014-08-17 DIAGNOSIS — M75102 Unspecified rotator cuff tear or rupture of left shoulder, not specified as traumatic: Secondary | ICD-10-CM

## 2014-08-17 MED ORDER — OXYCODONE HCL 15 MG PO TABS: 15.0000 mg | ORAL_TABLET | Freq: Three times a day (TID) | ORAL | Status: DC | PRN

## 2014-08-17 NOTE — Progress Notes (Signed)
Subjective:    Patient ID: [name], female    DOB: Dec 01, 1965, 48 y.o.   MRN: [id number]  HPI:[name] is a 48 year old female who returns for follow up for chronic pain and medication refill. She says her pain is located in herneck,bilateral hands and right heel.She rates her pain 6. Her currentl exercise regime is walking.   Pain Inventory Average Pain 7 Pain Right Now 6 My pain is burning, stabbing and tingling  In the last 24 hours, has pain interfered with the following? General activity 7 Relation with others 5 Enjoyment of life 5 What TIME of day is your pain at its worst? evening Sleep (in general) Fair  Pain is worse with: walking and standing Pain improves with: rest, heat/ice and medication Relief from Meds: 5  Mobility walk without assistance how many minutes can you walk? 30 ability to climb steps?  no do you drive?  yes Do you have any goals in this area?  yes  Function not employed: date last employed unsure Do you have any goals in this area?  yes  Neuro/Psych tremor trouble walking anxiety  Prior Studies Any changes since last visit?  no  Physicians involved in your care Any changes since last visit?  no   Family History  Problem Relation Age of Onset  . Diabetes Other   . Hypertension Other   . Schizophrenia Mother   . Hypertension Mother   . Stroke Father    History   Social History  . Marital Status: Divorced    Spouse Name: N/A    Number of Children: N/A  . Years of Education: N/A   Social History Main Topics  . Smoking status: Current Every Day Smoker -- 0.50 packs/day    Types: Cigarettes  . Smokeless tobacco: Never Used  . Alcohol Use: No  . Drug Use: No  . Sexual Activity: None   Other Topics Concern  . None   Social History Narrative   Past Surgical History  Procedure Laterality Date  . Abdominal hysterectomy    . Arthroscopic knee    . Cardiovascular stress test  03/2010    Lexiscan Myoview:  EF 55%, mild breast attenuation, no ischemia or scar  . Transthoracic echocardiogram  03/2010    EF 50-55%, normal LV size, no WMAs   Past Medical History  Diagnosis Date  . Fibromyalgia   . Bipolar 1 disorder   . Spinal stenosis   . Osteoarthritis   . Hypertension   . Obesity   . GERD (gastroesophageal reflux disease)   . PUD (peptic ulcer disease)   . Anxiety    BP 138/80 mmHg  Pulse 91  Resp 14  Ht 5\' 2"  (1.575 m)  Wt 270 lb (122.471 kg)  BMI 49.37 kg/m2  SpO2 97%  Opioid Risk Score:   Fall Risk Score: Low Fall Risk (0-5 points) Review of Systems     Objective:   Physical Exam  Constitutional: She is oriented to person, place, and time. She appears well-developed and well-nourished.  HENT:  Head: Normocephalic and atraumatic.  Neck: Normal range of motion. Neck supple.  Cervical Paraspinal Tenderness: C-3- C-5  Cardiovascular: Normal rate and regular rhythm.   Pulmonary/Chest: Effort normal and breath sounds normal.  Musculoskeletal:  Normal Muscle Bulk and Muscle Testing Reveals: Upper Extremities: Right: Full ROM and Muscle Strength 5/5 Left : Decreased ROM 90 Degrees and Muscle Strength 5/5 Lumbar Paraspinal Tenderness: L-3- L-5 Lower Extremities: Full ROM and  Muscle Strength 5/5 Arises from chair with ease Narrow Based gait  Neurological: She is alert and oriented to person, place, and time.  Skin: Skin is warm and dry.  Psychiatric: She has a normal mood and affect.  Nursing note and vitals reviewed.         Assessment & Plan:  1. Fibromyalgia Syndrome: Continue with exercise and heat therapy.  2. Osteoarthritis of Bilateral Knees: Continue with lidocaine ointment, heat and exercise therapy.  3. Left Shoulder OA: Continue with Exercise and heat therapy and Medication regime.  4. Bipolar Syndrome: On Depakote and Cymbalta.  5. Lumbar Spondylosis: Refilled:Oxycodone 15 mg one tablet every 8 hours as needed one tablet every 8 hours as needed #80.   6. Insomnia: Continue Elavil  20 minutes of face to face patient care time was spent during this visit. All questions were encouraged and answered.

## 2014-08-22 ENCOUNTER — Telehealth: Payer: Self-pay | Admitting: *Deleted

## 2014-08-22 NOTE — Telephone Encounter (Signed)
We can inject her shoulder again if she would like. She probably needs to see her orthopedic surgeon also for follow up. I don't believe she's ever had an orthopedic opinion====has she?

## 2014-08-22 NOTE — Telephone Encounter (Signed)
Called patient and left message to R/C RE: orthopedic Surgeon VS another injection.  Please return call

## 2014-08-22 NOTE — Telephone Encounter (Signed)
Pt states that her shoulder is failing, she can barely move it with pain radiating distally to the hand, unable to make a fist. Pt requesting communication and advice on what to do

## 2014-08-24 NOTE — Telephone Encounter (Signed)
2nd attempt to reach pt.  I have left another message asking her to call back so that we can talk with her about her shoulder and see if she wants to come in earlier to see [name] for a shoulder injection instead of seeing Eunice on 09/13/14.

## 2014-09-13 ENCOUNTER — Encounter: Payer: Self-pay | Admitting: Registered Nurse

## 2014-09-13 ENCOUNTER — Encounter: Payer: Medicare HMO | Attending: Physical Medicine and Rehabilitation | Admitting: Registered Nurse

## 2014-09-13 ENCOUNTER — Other Ambulatory Visit: Payer: Self-pay | Admitting: Physical Medicine & Rehabilitation

## 2014-09-13 VITALS — BP 127/78 | HR 94 | Resp 14 | Ht 62.0 in | Wt 268.0 lb

## 2014-09-13 DIAGNOSIS — M609 Myositis, unspecified: Secondary | ICD-10-CM | POA: Insufficient documentation

## 2014-09-13 DIAGNOSIS — M47817 Spondylosis without myelopathy or radiculopathy, lumbosacral region: Secondary | ICD-10-CM

## 2014-09-13 DIAGNOSIS — M19019 Primary osteoarthritis, unspecified shoulder: Secondary | ICD-10-CM | POA: Diagnosis present

## 2014-09-13 DIAGNOSIS — Z5181 Encounter for therapeutic drug level monitoring: Secondary | ICD-10-CM

## 2014-09-13 DIAGNOSIS — M129 Arthropathy, unspecified: Secondary | ICD-10-CM | POA: Insufficient documentation

## 2014-09-13 DIAGNOSIS — M170 Bilateral primary osteoarthritis of knee: Secondary | ICD-10-CM

## 2014-09-13 DIAGNOSIS — M12812 Other specific arthropathies, not elsewhere classified, left shoulder: Secondary | ICD-10-CM

## 2014-09-13 DIAGNOSIS — M19012 Primary osteoarthritis, left shoulder: Secondary | ICD-10-CM | POA: Insufficient documentation

## 2014-09-13 DIAGNOSIS — Z79899 Other long term (current) drug therapy: Secondary | ICD-10-CM

## 2014-09-13 DIAGNOSIS — M12512 Traumatic arthropathy, left shoulder: Secondary | ICD-10-CM | POA: Insufficient documentation

## 2014-09-13 DIAGNOSIS — M75102 Unspecified rotator cuff tear or rupture of left shoulder, not specified as traumatic: Secondary | ICD-10-CM

## 2014-09-13 DIAGNOSIS — M791 Myalgia: Secondary | ICD-10-CM | POA: Diagnosis present

## 2014-09-13 MED ORDER — OXYCODONE HCL 15 MG PO TABS: 15.0000 mg | ORAL_TABLET | Freq: Three times a day (TID) | ORAL | Status: DC | PRN

## 2014-09-13 NOTE — Progress Notes (Signed)
Subjective:    Patient ID: [name], female    DOB: 02/25/1966, 48 y.o.   MRN: [id number]  HPI:  [name] is a 48year old female who returns for follow up for chronic pain and medication refill. She says her pain is located in her left shoulder.She rates her pain 6. Her currentl exercise regime is walking. She will be joining water aerobics next month.   Pain Inventory Average Pain 6 Pain Right Now 6 My pain is burning, stabbing and aching  In the last 24 hours, has pain interfered with the following? General activity 5 Relation with others 5 Enjoyment of life 5 What TIME of day is your pain at its worst? morning Sleep (in general) Poor  Pain is worse with: walking, sitting, standing and some activites Pain improves with: rest and medication Relief from Meds: 5  Mobility walk without assistance how many minutes can you walk? 20 ability to climb steps?  yes do you drive?  yes  Function Do you have any goals in this area?  no  Neuro/Psych tremor tingling trouble walking anxiety  Prior Studies Any changes since last visit?  no  Physicians involved in your care Any changes since last visit?  no   Family History  Problem Relation Age of Onset  . Diabetes Other   . Hypertension Other   . Schizophrenia Mother   . Hypertension Mother   . Stroke Father    History   Social History  . Marital Status: Divorced    Spouse Name: N/A    Number of Children: N/A  . Years of Education: N/A   Social History Main Topics  . Smoking status: Current Every Day Smoker -- 0.50 packs/day    Types: Cigarettes  . Smokeless tobacco: Never Used  . Alcohol Use: No  . Drug Use: No  . Sexual Activity: None   Other Topics Concern  . None   Social History Narrative   Past Surgical History  Procedure Laterality Date  . Abdominal hysterectomy    . Arthroscopic knee    . Cardiovascular stress test  03/2010    Lexiscan Myoview: EF 55%, mild breast attenuation,  no ischemia or scar  . Transthoracic echocardiogram  03/2010    EF 50-55%, normal LV size, no WMAs   Past Medical History  Diagnosis Date  . Fibromyalgia   . Bipolar 1 disorder   . Spinal stenosis   . Osteoarthritis   . Hypertension   . Obesity   . GERD (gastroesophageal reflux disease)   . PUD (peptic ulcer disease)   . Anxiety    BP 127/78 mmHg  Pulse 94  Resp 14  Ht 5\' 2"  (1.575 m)  Wt 268 lb (121.564 kg)  BMI 49.01 kg/m2  SpO2 97%  Opioid Risk Score:   Fall Risk Score: Low Fall Risk (0-5 points) (previously educated and given handout) Review of Systems  Constitutional: Positive for unexpected weight change.  Musculoskeletal: Positive for gait problem.  Neurological: Positive for tremors.       Tingling  Psychiatric/Behavioral: The patient is nervous/anxious.   All other systems reviewed and are negative.      Objective:   Physical Exam  Constitutional: She is oriented to person, place, and time. She appears well-developed and well-nourished.  HENT:  Head: Normocephalic and atraumatic.  Neck: Normal range of motion. Neck supple.  Cardiovascular: Normal rate and regular rhythm.   Pulmonary/Chest: Effort normal and breath sounds normal.  Musculoskeletal:  Normal Muscle  Bulk and Muscle Testing Reveals: Upper Extremities: Right: Full ROM and Muscle Strength 5/5 Left Decreased ROM 45 Degrees flexion and Extension Full  Lower Extremities: Full ROM and Muscle Strength 5/5 Arises from chair with ease Narrow Based Gait    Neurological: She is alert and oriented to person, place, and time.  Skin: Skin is warm.  Psychiatric: She has a normal mood and affect.  Nursing note and vitals reviewed.         Assessment & Plan:  1. Fibromyalgia Syndrome: Continue with exercise and heat therapy.  2. Osteoarthritis of Bilateral Knees: Continue with lidocaine ointment, heat and exercise therapy.  3. Left Shoulder OA: Continue with Exercise and heat therapy and  Medication regime.  4. Bipolar Syndrome: On Depakote and Cymbalta.  5. Lumbar Spondylosis: Refilled:Oxycodone 15 mg one tablet every 8 hours as needed one tablet every 8 hours as needed #80.  6. Insomnia: Continue Elavil  20 minutes of face to face patient care time was spent during this visit. All questions were encouraged and answered.

## 2014-09-15 LAB — PMP ALCOHOL METABOLITE (ETG): Ethyl Glucuronide (EtG): NEGATIVE ng/mL

## 2014-09-18 LAB — OXYCODONE, URINE (LC/MS-MS)
Noroxycodone, Ur: 1169 ng/mL (ref <50)
OXYCODONE, UR: 1248 ng/mL (ref <50)
Oxymorphone: 253 ng/mL (ref <50)

## 2014-09-18 LAB — BENZODIAZEPINES (GC/LC/MS), URINE
Alprazolam metabolite (GC/LC/MS), ur confirm: 129 ng/mL (ref <25)
Clonazepam metabolite (GC/LC/MS), ur confirm: NEGATIVE ng/mL (ref <25)
FLURAZEPAMU: NEGATIVE ng/mL (ref <50)
LORAZEPAMU: NEGATIVE ng/mL (ref <50)
Midazolam (GC/LC/MS), ur confirm: NEGATIVE ng/mL (ref <50)
Nordiazepam (GC/LC/MS), ur confirm: NEGATIVE ng/mL (ref <50)
OXAZEPAMU: NEGATIVE ng/mL (ref <50)
TEMAZEPAMU: NEGATIVE ng/mL (ref <50)
Triazolam metabolite (GC/LC/MS), ur confirm: NEGATIVE ng/mL (ref <50)

## 2014-09-18 LAB — OPIATES/OPIOIDS (LC/MS-MS)
Codeine Urine: NEGATIVE ng/mL (ref <50)
HYDROMORPHONE: NEGATIVE ng/mL (ref <50)
Hydrocodone: NEGATIVE ng/mL (ref <50)
Morphine Urine: NEGATIVE ng/mL (ref <50)
NOROXYCODONE, UR: 1169 ng/mL (ref <50)
Norhydrocodone, Ur: NEGATIVE ng/mL (ref <50)
OXYCODONE, UR: 1248 ng/mL (ref <50)
Oxymorphone: 253 ng/mL (ref <50)

## 2014-09-19 ENCOUNTER — Other Ambulatory Visit: Payer: Self-pay | Admitting: Physical Medicine & Rehabilitation

## 2014-09-19 ENCOUNTER — Other Ambulatory Visit: Payer: Self-pay | Admitting: Registered Nurse

## 2014-09-19 LAB — PRESCRIPTION MONITORING PROFILE (SOLSTAS)
Amphetamine/Meth: NEGATIVE ng/mL
BUPRENORPHINE, URINE: NEGATIVE ng/mL
Barbiturate Screen, Urine: NEGATIVE ng/mL
COCAINE METABOLITES: NEGATIVE ng/mL
Cannabinoid Scrn, Ur: NEGATIVE ng/mL
Carisoprodol, Urine: NEGATIVE ng/mL
Creatinine, Urine: 77.78 mg/dL (ref >20.0)
ECSTASY: NEGATIVE ng/mL
FENTANYL URINE: NEGATIVE ng/mL
Meperidine, Ur: NEGATIVE ng/mL
Methadone Screen, Urine: NEGATIVE ng/mL
Nitrites, Initial: NEGATIVE ug/mL
PROPOXYPHENE: NEGATIVE ng/mL
Tapentadol, urine: NEGATIVE ng/mL
Tramadol Scrn, Ur: NEGATIVE ng/mL
Zolpidem, Urine: NEGATIVE ng/mL
pH, Initial: 5.6 pH (ref 4.5–8.9)

## 2014-09-20 ENCOUNTER — Other Ambulatory Visit: Payer: Self-pay | Admitting: *Deleted

## 2014-09-20 MED ORDER — AMITRIPTYLINE HCL 25 MG PO TABS: 25.0000 mg | ORAL_TABLET | Freq: Every day | ORAL | Status: DC

## 2014-09-26 NOTE — Progress Notes (Signed)
Urine drug screen for this encounter is consistent 

## 2014-10-09 ENCOUNTER — Encounter: Payer: Self-pay | Admitting: Registered Nurse

## 2014-10-09 ENCOUNTER — Encounter (HOSPITAL_BASED_OUTPATIENT_CLINIC_OR_DEPARTMENT_OTHER): Payer: Medicare HMO | Admitting: Registered Nurse

## 2014-10-09 VITALS — BP 132/88 | HR 100 | Resp 14

## 2014-10-09 DIAGNOSIS — M47817 Spondylosis without myelopathy or radiculopathy, lumbosacral region: Secondary | ICD-10-CM | POA: Diagnosis not present

## 2014-10-09 DIAGNOSIS — M170 Bilateral primary osteoarthritis of knee: Secondary | ICD-10-CM

## 2014-10-09 DIAGNOSIS — Z79899 Other long term (current) drug therapy: Secondary | ICD-10-CM

## 2014-10-09 DIAGNOSIS — Z5181 Encounter for therapeutic drug level monitoring: Secondary | ICD-10-CM

## 2014-10-09 MED ORDER — OXYCODONE HCL 15 MG PO TABS: 15.0000 mg | ORAL_TABLET | Freq: Three times a day (TID) | ORAL | Status: DC | PRN

## 2014-10-09 NOTE — Progress Notes (Signed)
Subjective:    Patient ID: [name], female    DOB: Apr 03, 1966, 48 y.o.   MRN: [id number]  HPI: [name] is a 48year old female who returns for follow up for chronic pain and medication refill. She says her pain is located in her left shoulder and right heel.She rates her pain 6. Her currentl exercise regime is walking. She will be joining water aerobics in January 2016. [employer] carrier will be changed to [place], she would like to go back to [employer]. Spenser was instructed to call [place] 10/16/2014 to make sure medication is covered and she verbalizes understanding.  Pain Inventory Average Pain 6 Pain Right Now 6 My pain is intermittent, burning, dull and aching  In the last 24 hours, has pain interfered with the following? General activity 6 Relation with others 5 Enjoyment of life 6 What TIME of day is your pain at its worst? daytime Sleep (in general) Poor  Pain is worse with: walking Pain improves with: medication Relief from Meds: 5  Mobility walk without assistance  Function disabled: date disabled .  Neuro/Psych numbness tremor trouble walking  Prior Studies Any changes since last visit?  no  Physicians involved in your care Any changes since last visit?  no   Family History  Problem Relation Age of Onset  . Diabetes Other   . Hypertension Other   . Schizophrenia Mother   . Hypertension Mother   . Stroke Father    History   Social History  . Marital Status: Divorced    Spouse Name: N/A    Number of Children: N/A  . Years of Education: N/A   Social History Main Topics  . Smoking status: Current Every Day Smoker -- 0.50 packs/day    Types: Cigarettes  . Smokeless tobacco: Never Used  . Alcohol Use: No  . Drug Use: No  . Sexual Activity: None   Other Topics Concern  . None   Social History Narrative   Past Surgical History  Procedure Laterality Date  . Abdominal hysterectomy    . Arthroscopic knee    . Cardiovascular  stress test  03/2010    Lexiscan Myoview: EF 55%, mild breast attenuation, no ischemia or scar  . Transthoracic echocardiogram  03/2010    EF 50-55%, normal LV size, no WMAs   Past Medical History  Diagnosis Date  . Fibromyalgia   . Bipolar 1 disorder   . Spinal stenosis   . Osteoarthritis   . Hypertension   . Obesity   . GERD (gastroesophageal reflux disease)   . PUD (peptic ulcer disease)   . Anxiety    BP 132/88 mmHg  Pulse 100  Resp 14  SpO2 94%  Opioid Risk Score:   Fall Risk Score: Low Fall Risk (0-5 points)   Review of Systems  Constitutional: Negative.   HENT: Negative.   Eyes: Negative.   Respiratory: Negative.   Cardiovascular: Negative.   Gastrointestinal: Negative.   Endocrine: Negative.   Genitourinary: Positive for decreased urine volume.  Musculoskeletal: Positive for myalgias and back pain.  Skin: Negative.   Allergic/Immunologic: Negative.   Neurological: Positive for tremors and numbness.       Tingling`  Hematological: Negative.   Psychiatric/Behavioral: Negative.        Objective:   Physical Exam  Constitutional: She is oriented to person, place, and time. She appears well-developed and well-nourished.  HENT:  Head: Normocephalic and atraumatic.  Neck: Normal range of motion. Neck supple.  Cardiovascular:  Normal rate and regular rhythm.   Pulmonary/Chest: Effort normal and breath sounds normal.  Musculoskeletal:  Normal Muscle Bulk and Muscle testing Reveals: Upper Extremities: Right: Full ROM and Muscle Strength 5/5 Left: Decreased ROM 45 Degreesand Muscle strength 5/5 Left AC Joint Tenderness Lower Extremities: Full ROM and Muscle strength 5/5 Arises from chair with ease Narrow Based gait  Neurological: She is alert and oriented to person, place, and time.  Skin: Skin is warm and dry.  Psychiatric: She has a normal mood and affect.  Nursing note and vitals reviewed.         Assessment & Plan:  1. Fibromyalgia Syndrome:  Continue with exercise and heat therapy.  2. Osteoarthritis of Bilateral Knees: Continue with lidocaine ointment, heat and exercise therapy.  3. Left Shoulder OA: Continue with Exercise and heat therapy and Medication regime. Plan for cortisone injection with [name] 4. Bipolar Syndrome: On Depakote and Cymbalta.  5. Lumbar Spondylosis: Refilled:Oxycodone 15 mg one tablet every 8 hours as needed one tablet every 8 hours as needed #80.  6. Insomnia: Continue Elavil  20 minutes of face to face patient care time was spent during this visit. All questions were encouraged and answered.

## 2014-10-16 ENCOUNTER — Telehealth: Payer: Self-pay | Admitting: *Deleted

## 2014-10-16 NOTE — Telephone Encounter (Signed)
Pt called hoping to have a quick conversation with eunice about insurance and medication coverage

## 2014-10-16 NOTE — Telephone Encounter (Signed)
Spoke with [name] , She states" she spoke to her Insurance carrier, the Opana should be covered. We will prescribed Opana on her next visit, she verbalizes understanding.

## 2014-10-30 ENCOUNTER — Other Ambulatory Visit: Payer: Self-pay | Admitting: Registered Nurse

## 2014-11-02 ENCOUNTER — Other Ambulatory Visit: Payer: Self-pay | Admitting: *Deleted

## 2014-11-02 MED ORDER — DULOXETINE HCL 60 MG PO CPEP: 60.0000 mg | ORAL_CAPSULE | Freq: Every day | ORAL | Status: DC

## 2014-11-02 MED ORDER — AMITRIPTYLINE HCL 25 MG PO TABS: 25.0000 mg | ORAL_TABLET | Freq: Every day | ORAL | Status: DC

## 2014-11-02 NOTE — Telephone Encounter (Signed)
recvd fax request - sent in electronically

## 2014-11-06 ENCOUNTER — Encounter: Payer: Medicare HMO | Attending: Physical Medicine and Rehabilitation | Admitting: Physical Medicine & Rehabilitation

## 2014-11-06 ENCOUNTER — Telehealth: Payer: Self-pay | Admitting: *Deleted

## 2014-11-06 ENCOUNTER — Encounter: Payer: Self-pay | Admitting: Physical Medicine & Rehabilitation

## 2014-11-06 VITALS — BP 132/88 | HR 98 | Resp 14

## 2014-11-06 DIAGNOSIS — M129 Arthropathy, unspecified: Secondary | ICD-10-CM | POA: Diagnosis present

## 2014-11-06 DIAGNOSIS — M75102 Unspecified rotator cuff tear or rupture of left shoulder, not specified as traumatic: Secondary | ICD-10-CM

## 2014-11-06 DIAGNOSIS — IMO0001 Reserved for inherently not codable concepts without codable children: Secondary | ICD-10-CM

## 2014-11-06 DIAGNOSIS — M19012 Primary osteoarthritis, left shoulder: Secondary | ICD-10-CM | POA: Insufficient documentation

## 2014-11-06 DIAGNOSIS — M12812 Other specific arthropathies, not elsewhere classified, left shoulder: Secondary | ICD-10-CM

## 2014-11-06 DIAGNOSIS — M19019 Primary osteoarthritis, unspecified shoulder: Secondary | ICD-10-CM | POA: Diagnosis present

## 2014-11-06 DIAGNOSIS — M609 Myositis, unspecified: Secondary | ICD-10-CM | POA: Diagnosis present

## 2014-11-06 DIAGNOSIS — M791 Myalgia: Secondary | ICD-10-CM | POA: Insufficient documentation

## 2014-11-06 DIAGNOSIS — Z5181 Encounter for therapeutic drug level monitoring: Secondary | ICD-10-CM | POA: Diagnosis present

## 2014-11-06 DIAGNOSIS — Z79899 Other long term (current) drug therapy: Secondary | ICD-10-CM | POA: Insufficient documentation

## 2014-11-06 DIAGNOSIS — M12512 Traumatic arthropathy, left shoulder: Secondary | ICD-10-CM | POA: Diagnosis not present

## 2014-11-06 DIAGNOSIS — M47817 Spondylosis without myelopathy or radiculopathy, lumbosacral region: Secondary | ICD-10-CM | POA: Diagnosis not present

## 2014-11-06 DIAGNOSIS — M19212 Secondary osteoarthritis, left shoulder: Secondary | ICD-10-CM

## 2014-11-06 DIAGNOSIS — M170 Bilateral primary osteoarthritis of knee: Secondary | ICD-10-CM | POA: Diagnosis not present

## 2014-11-06 MED ORDER — OXYMORPHONE HCL 10 MG PO TABS: 10.0000 mg | ORAL_TABLET | Freq: Three times a day (TID) | ORAL | Status: DC | PRN

## 2014-11-06 NOTE — Progress Notes (Signed)
Subjective:    Patient ID: [name], female    DOB: [birth year], 49 y.o.   MRN: [id number]  HPI   [name] is here regarding her chronic pain. Her pain levels have been stable. She has been upset about a friend who's sick in the hospital.   Her left shoulder has continued to worsen. She has limited ROM and ongoing, increasing pain. The injection we performed in the summer provided a month or two of relief. She has seen [name] in the past for the shoulder pain, ?Dr. Apolonio Walter.  She would like to get back on the opana again---her insurance will cover.      Pain Inventory Average Pain 7 Pain Right Now 7 My pain is constant, sharp, burning, stabbing and aching  In the last 24 hours, has pain interfered with the following? General activity 5 Relation with others 5 Enjoyment of life 5 What TIME of day is your pain at its worst? VARIES Sleep (in general) Poor  Pain is worse with: walking, bending, sitting, standing and some activites Pain improves with: rest and medication Relief from Meds: 5  Mobility walk without assistance how many minutes can you walk? 10 ability to climb steps?  yes do you drive?  yes  Function disabled: date disabled .  Neuro/Psych weakness trouble walking anxiety  Prior Studies Any changes since last visit?  no  Physicians involved in your care Any changes since last visit?  no   Family History  Problem Relation Age of Onset  . Diabetes Other   . Hypertension Other   . Schizophrenia Mother   . Hypertension Mother   . Stroke Father    History   Social History  . Marital Status: Divorced    Spouse Name: N/A    Number of Children: N/A  . Years of Education: N/A   Social History Main Topics  . Smoking status: Current Every Day Smoker -- 0.50 packs/day    Types: Cigarettes  . Smokeless tobacco: Never Used  . Alcohol Use: No  . Drug Use: No  . Sexual Activity: None   Other Topics Concern  . None   Social History  Narrative   Past Surgical History  Procedure Laterality Date  . Abdominal hysterectomy    . Arthroscopic knee    . Cardiovascular stress test  03/2010    Lexiscan Myoview: EF 55%, mild breast attenuation, no ischemia or scar  . Transthoracic echocardiogram  03/2010    EF 50-55%, normal LV size, no WMAs   Past Medical History  Diagnosis Date  . Fibromyalgia   . Bipolar 1 disorder   . Spinal stenosis   . Osteoarthritis   . Hypertension   . Obesity   . GERD (gastroesophageal reflux disease)   . PUD (peptic ulcer disease)   . Anxiety    BP 132/88 mmHg  Pulse 98  Resp 14  SpO2 97%  Opioid Risk Score:   Fall Risk Score: Low Fall Risk (0-5 points)   Review of Systems  HENT: Negative.   Eyes: Negative.   Respiratory: Negative.   Cardiovascular: Negative.   Gastrointestinal: Negative.   Endocrine: Negative.   Genitourinary: Negative.   Musculoskeletal: Positive for myalgias, back pain and arthralgias.  Skin: Negative.   Allergic/Immunologic: Negative.   Neurological: Positive for weakness and numbness.  Hematological: Negative.   Psychiatric/Behavioral: The patient is nervous/anxious.        Objective:   Physical Exam  General: Alert and oriented x 3, No  apparent distress. obese  HEENT: Head is normocephalic, atraumatic, PERRLA, EOMI, sclera anicteric, oral mucosa pink and moist, dentition intact, ext ear canals clear,  Neck: Supple without JVD or lymphadenopathy  Heart: Reg rate and rhythm. No murmurs rubs or gallops  Chest: CTA bilaterally without wheezes, rales, or rhonchi; no distress  Abdomen: Soft, non-tender, non-distended, bowel sounds positive.  Extremities: No clubbing, cyanosis, or edema. Pulses are 2+  Skin: Clean and intact without signs of breakdown. Numerous tattoos noted.  Neuro: Pt is cognitively appropriate with normal insight, memory, and awareness. Cranial nerves 2-12 are intact. Sensory exam is normal. Reflexes are 2+ in all 4's except for the  achilles which were trace to 1+. Fine motor coordination is intact. No tremors. Motor function is grossly 5/5 except for inhibition at the left shoulder.   Musculoskeletal: Left shoulder pain with ER, IR and abduction. She could not abduct the shoulder past 45 degrees before she had substantial pain--pt resisted movement. Low back was tender to palpation in the lower lumbar spine segments, paritcularly L3-S1. She was able to bend to about 45 degrees and extend to 20 degrees with extension causes more pain. She had generalized tenderness along the left elbow, both greater trochs, traps, sub-occipital areas, . Mild crepitus right knee and moderate on the left. She had medial joint line pain right knee and med/lat joint line pain on left. Meniscal maneuvers provoked pain in the medial right knee and med/lat on the left.  Psych:Affect was pleasant and appropriate.      Assessment & Plan:   1. Fibromyalgia syndrome  2. Osteoarthritis of bilateral knees  3. 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 Bipolar syndrome  5. Lumbar spondylosis ?facet arthropathy  6. Insomnia    Plan:  1. Continue with regular exercise and stretching at home.  2. After informed consent and preparation of the skin with betadine and isopropyl alcohol, I injected 6mg  (1cc) of celestone and 4cc of 1% lidocaine into the left subacromial space via lateral approach. Additionally, aspiration was performed prior to injection. The patient tolerated well, and no complications were encountered. Afterward the area was cleaned and dressed. Post- injection instructions were provided.  3. Resume opana 10mg  q8 prn #85  4. Cymbalta---continue for mood and pain effects. I believe this has helped  5. Needs surgical follow up for her left shoulder. I believe she's seen [name]/[name] in the past.   6.  elavil 25mg  qhs for sleep/pain has been effective. 7. Flexeril for muscle spasms and  sleep. .  8. Follow up with me or my NP in 1 months. 30 minutes of face to face patient care time were spent during this visit. All questions were encouraged and answered. I

## 2014-11-06 NOTE — Telephone Encounter (Addendum)
Pt asking for Danella Sensing to please give her a call back  I Placed a call to Ms. Woehrle, no answer left message to return call.

## 2014-11-06 NOTE — Patient Instructions (Signed)
PLEASE CALL ME WITH ANY PROBLEMS OR QUESTIONS (#[phone]).      

## 2014-11-20 ENCOUNTER — Telehealth: Payer: Self-pay | Admitting: *Deleted

## 2014-11-20 MED ORDER — DULOXETINE HCL 60 MG PO CPEP: 60.0000 mg | ORAL_CAPSULE | Freq: Every day | ORAL | Status: DC

## 2014-11-20 MED ORDER — AMITRIPTYLINE HCL 25 MG PO TABS: 25.0000 mg | ORAL_TABLET | Freq: Every day | ORAL | Status: DC

## 2014-11-23 ENCOUNTER — Telehealth: Payer: Self-pay | Admitting: *Deleted

## 2014-11-23 DIAGNOSIS — M5440 Lumbago with sciatica, unspecified side: Secondary | ICD-10-CM

## 2014-11-23 DIAGNOSIS — M25552 Pain in left hip: Secondary | ICD-10-CM

## 2014-11-23 NOTE — Telephone Encounter (Signed)
Asking for call back from [name]--no reason given.I called and spoke with her and she is wanting to get an x-ray of her hip with the dye in it.  She is in a lot of pain.

## 2014-11-23 NOTE — Telephone Encounter (Deleted)
Asking for call back from Eunice--no reason given.I called and spoke with her and she is wanting to get an x-ray of her hip with the dye in it.  She is in a lot of pain.

## 2014-11-24 NOTE — Telephone Encounter (Signed)
Return [name] call, she has been experiencing increase intensity of pain in her lower back and left hip. X-rays ordered. She verbalizes understanding

## 2014-11-29 ENCOUNTER — Telehealth: Payer: Self-pay | Admitting: *Deleted

## 2014-11-29 MED ORDER — OXYCODONE HCL 10 MG PO TABS: 10.0000 mg | ORAL_TABLET | Freq: Three times a day (TID) | ORAL | Status: DC | PRN

## 2014-11-29 NOTE — Telephone Encounter (Signed)
Per [name] we are switching her to oxycodone 10 mg and discontinuing the Opana 10 mg, same disp #  and same sig since Opana is not covered by insurance  Opana discontinued and rx for oxycodone printed for Naaman Plummer to sign.   [place] notified.

## 2014-11-29 NOTE — Telephone Encounter (Signed)
Patient did not pick up script that was printed and signed. It is being held in our office

## 2014-11-29 NOTE — Telephone Encounter (Signed)
I called pt to notify that an alternative rx was written for oxycodone 10 mg to counteract the fact that her Opana was denied. Patient said she got her Opana script in January filled. It is likely that the script was filled as a temporary supply until an alternative medication could be prescribed. [facility] discontinued the Opana from the pt's medication record. It may have to be reinstated. Pt has an appt 12/04/2014

## 2014-12-04 ENCOUNTER — Encounter: Payer: Medicare HMO | Admitting: Registered Nurse

## 2014-12-05 ENCOUNTER — Ambulatory Visit: Payer: Commercial Managed Care - HMO | Admitting: Registered Nurse

## 2014-12-06 ENCOUNTER — Encounter: Payer: Self-pay | Admitting: Registered Nurse

## 2014-12-06 ENCOUNTER — Encounter: Payer: Medicare HMO | Attending: Physical Medicine and Rehabilitation | Admitting: Registered Nurse

## 2014-12-06 VITALS — BP 138/84 | HR 109 | Resp 16

## 2014-12-06 DIAGNOSIS — M19019 Primary osteoarthritis, unspecified shoulder: Secondary | ICD-10-CM | POA: Diagnosis present

## 2014-12-06 DIAGNOSIS — M791 Myalgia: Secondary | ICD-10-CM | POA: Diagnosis present

## 2014-12-06 DIAGNOSIS — M5440 Lumbago with sciatica, unspecified side: Secondary | ICD-10-CM

## 2014-12-06 DIAGNOSIS — M12512 Traumatic arthropathy, left shoulder: Secondary | ICD-10-CM | POA: Insufficient documentation

## 2014-12-06 DIAGNOSIS — M19012 Primary osteoarthritis, left shoulder: Secondary | ICD-10-CM | POA: Insufficient documentation

## 2014-12-06 DIAGNOSIS — IMO0001 Reserved for inherently not codable concepts without codable children: Secondary | ICD-10-CM

## 2014-12-06 DIAGNOSIS — M609 Myositis, unspecified: Secondary | ICD-10-CM | POA: Insufficient documentation

## 2014-12-06 DIAGNOSIS — M47817 Spondylosis without myelopathy or radiculopathy, lumbosacral region: Secondary | ICD-10-CM | POA: Diagnosis present

## 2014-12-06 DIAGNOSIS — M129 Arthropathy, unspecified: Secondary | ICD-10-CM | POA: Diagnosis present

## 2014-12-06 DIAGNOSIS — Z79899 Other long term (current) drug therapy: Secondary | ICD-10-CM | POA: Insufficient documentation

## 2014-12-06 DIAGNOSIS — Z5181 Encounter for therapeutic drug level monitoring: Secondary | ICD-10-CM | POA: Diagnosis present

## 2014-12-06 DIAGNOSIS — M170 Bilateral primary osteoarthritis of knee: Secondary | ICD-10-CM

## 2014-12-06 NOTE — Progress Notes (Signed)
Subjective:    Patient ID: [name], female    DOB: 04/28/1966, 49 y.o.   MRN: [id number]  HPI: [name] is a 49year old female who returns for follow up for chronic pain and medication refill. She says her pain is located in her left shoulder, left groin, bilateral knees and right heel.She rates her pain 4. She's not following her usual  exercise regime. S/P cortisone injection left shoulder minimal relief noted. Arrived to office tachycardic heart rate 109 pulse re-checked 95. [name] and her boyfriend have separated she's very emotional today and crying. Emotional support given also [facility] given. Encouraged to go for counseling she verbalizes understanding.  Pain Inventory Average Pain 4 Pain Right Now 4 My pain is burning, stabbing and aching  In the last 24 hours, has pain interfered with the following? General activity 4 Relation with others 4 Enjoyment of life 5 What TIME of day is your pain at its worst? night Sleep (in general) Poor  Pain is worse with: walking, bending, sitting and standing Pain improves with: rest and medication Relief from Meds: 5  Mobility walk without assistance Do you have any goals in this area?  no  Function Do you have any goals in this area?  no  Neuro/Psych No problems in this area  Prior Studies Any changes since last visit?  no  Physicians involved in your care Any changes since last visit?  no   Family History  Problem Relation Age of Onset  . Diabetes Other   . Hypertension Other   . Schizophrenia Mother   . Hypertension Mother   . Stroke Father    History   Social History  . Marital Status: Divorced    Spouse Name: N/A  . Number of Children: N/A  . Years of Education: N/A   Social History Main Topics  . Smoking status: Current Every Day Smoker -- 0.50 packs/day    Types: Cigarettes  . Smokeless tobacco: Never Used  . Alcohol Use: No  . Drug Use: No  . Sexual Activity: Not on  file   Other Topics Concern  . None   Social History Narrative   Past Surgical History  Procedure Laterality Date  . Abdominal hysterectomy    . Arthroscopic knee    . Cardiovascular stress test  03/2010    Lexiscan Myoview: EF 55%, mild breast attenuation, no ischemia or scar  . Transthoracic echocardiogram  03/2010    EF 50-55%, normal LV size, no WMAs   Past Medical History  Diagnosis Date  . Fibromyalgia   . Bipolar 1 disorder   . Spinal stenosis   . Osteoarthritis   . Hypertension   . Obesity   . GERD (gastroesophageal reflux disease)   . PUD (peptic ulcer disease)   . Anxiety    BP 138/84 mmHg  Pulse 109  Resp 16  SpO2 96%  Opioid Risk Score:   Fall Risk Score: Low Fall Risk (0-5 points) (previously educated)  Review of Systems  Musculoskeletal: Positive for arthralgias.  All other systems reviewed and are negative.      Objective:   Physical Exam  Constitutional: She is oriented to person, place, and time. She appears well-developed and well-nourished.  HENT:  Head: Normocephalic and atraumatic.  Neck: Normal range of motion. Neck supple.  Cardiovascular: Normal rate and regular rhythm.   Pulmonary/Chest: Effort normal and breath sounds normal.  Musculoskeletal:  Normal Muscle Bulk and Muscle Testing Reveals: Upper Extremities:  Right: Full ROM and Muscle Strength 5/5 Left: Decreased ROM 45 Degrees and Muscle Strength 5/5 Lumbar Paraspinal Tenderness: L-3- L-5 Lower Extremities: Full ROM and Muscle Strength 5/5 Arises from chair with ease Antalgic Gait  Neurological: She is alert and oriented to person, place, and time.  Skin: Skin is warm and dry.  Psychiatric: She has a normal mood and affect.  Nursing note and vitals reviewed.         Assessment & Plan:  1. Fibromyalgia Syndrome: Continue with exercise and heat therapy.  2. Osteoarthritis of Bilateral Knees: Continue with lidocaine ointment, heat and exercise therapy.  3. Left Shoulder  OA: Continue with Exercise and heat therapy and Medication regime. Plan for cortisone injection with [name] 4. Bipolar Syndrome: On Depakote and Cymbalta.  5. Lumbar Spondylosis: Refilled:Oxycodone 10mg  one tablet every 8 hours as needed  #85.  6. Insomnia: Continue Elavil  20 minutes of face to face patient care time was spent during this visit. All questions were encouraged and answered.

## 2015-01-02 ENCOUNTER — Encounter: Payer: Self-pay | Admitting: Registered Nurse

## 2015-01-02 ENCOUNTER — Encounter: Payer: Medicare HMO | Attending: Physical Medicine and Rehabilitation | Admitting: Registered Nurse

## 2015-01-02 VITALS — BP 120/80 | HR 76 | Resp 14

## 2015-01-02 DIAGNOSIS — M19212 Secondary osteoarthritis, left shoulder: Secondary | ICD-10-CM | POA: Diagnosis not present

## 2015-01-02 DIAGNOSIS — IMO0001 Reserved for inherently not codable concepts without codable children: Secondary | ICD-10-CM

## 2015-01-02 DIAGNOSIS — M791 Myalgia: Secondary | ICD-10-CM | POA: Diagnosis present

## 2015-01-02 DIAGNOSIS — Z5181 Encounter for therapeutic drug level monitoring: Secondary | ICD-10-CM | POA: Insufficient documentation

## 2015-01-02 DIAGNOSIS — M609 Myositis, unspecified: Secondary | ICD-10-CM

## 2015-01-02 DIAGNOSIS — M12812 Other specific arthropathies, not elsewhere classified, left shoulder: Secondary | ICD-10-CM

## 2015-01-02 DIAGNOSIS — M25512 Pain in left shoulder: Secondary | ICD-10-CM

## 2015-01-02 DIAGNOSIS — M129 Arthropathy, unspecified: Secondary | ICD-10-CM | POA: Diagnosis present

## 2015-01-02 DIAGNOSIS — M75102 Unspecified rotator cuff tear or rupture of left shoulder, not specified as traumatic: Secondary | ICD-10-CM

## 2015-01-02 DIAGNOSIS — M47817 Spondylosis without myelopathy or radiculopathy, lumbosacral region: Secondary | ICD-10-CM

## 2015-01-02 DIAGNOSIS — M19019 Primary osteoarthritis, unspecified shoulder: Secondary | ICD-10-CM | POA: Diagnosis present

## 2015-01-02 DIAGNOSIS — M19012 Primary osteoarthritis, left shoulder: Secondary | ICD-10-CM | POA: Diagnosis present

## 2015-01-02 DIAGNOSIS — M12512 Traumatic arthropathy, left shoulder: Secondary | ICD-10-CM

## 2015-01-02 DIAGNOSIS — Z79899 Other long term (current) drug therapy: Secondary | ICD-10-CM | POA: Insufficient documentation

## 2015-01-02 MED ORDER — AMITRIPTYLINE HCL 50 MG PO TABS: ORAL_TABLET | ORAL | Status: DC

## 2015-01-02 MED ORDER — OXYCODONE HCL 15 MG PO TABS: 15.0000 mg | ORAL_TABLET | Freq: Three times a day (TID) | ORAL | Status: DC | PRN

## 2015-01-02 MED ORDER — OXYCODONE HCL 15 MG PO TABS: 15.0000 mg | ORAL_TABLET | Freq: Three times a day (TID) | ORAL | Status: DC

## 2015-01-02 NOTE — Progress Notes (Signed)
Subjective:    Patient ID: [name], female    DOB: Mar 05, 1966, 49 y.o.   MRN: [id number]  HPI: Ms. Britanny Marksberry is a 49year old female who returns for follow up for chronic pain and medication refill. She says her pain is located in her neck and left shoulder. She states she is in excruciating pain in her left shoulder. She a awaiting orthopedic visit placed a referral.She rates her pain 9. Will increase her oxycodone this month  She verbalizes understanding.She will be joining water aeroboics this month. Daughter in room all questions answered.  Pain Inventory Average Pain 8 Pain Right Now 9 My pain is intermittent, sharp, stabbing, tingling and aching  In the last 24 hours, has pain interfered with the following? General activity 7 Relation with others 5 Enjoyment of life 8 What TIME of day is your pain at its worst? ALL Sleep (in general) Fair  Pain is worse with: walking and some activites Pain improves with: rest and medication Relief from Meds: 3  Mobility walk without assistance how many minutes can you walk? 30 ability to climb steps?  yes do you drive?  yes  Function disabled: date disabled .  Neuro/Psych tremor tingling depression anxiety  Prior Studies Any changes since last visit?  no  Physicians involved in your care Any changes since last visit?  no   Family History  Problem Relation Age of Onset  . Diabetes Other   . Hypertension Other   . Schizophrenia Mother   . Hypertension Mother   . Stroke Father    History   Social History  . Marital Status: Divorced    Spouse Name: N/A  . Number of Children: N/A  . Years of Education: N/A   Social History Main Topics  . Smoking status: Current Every Day Smoker -- 0.50 packs/day    Types: Cigarettes  . Smokeless tobacco: Never Used  . Alcohol Use: No  . Drug Use: No  . Sexual Activity: Not on file   Other Topics Concern  . None   Social History Narrative   Past Surgical History    Procedure Laterality Date  . Abdominal hysterectomy    . Arthroscopic knee    . Cardiovascular stress test  03/2010    Lexiscan Myoview: EF 55%, mild breast attenuation, no ischemia or scar  . Transthoracic echocardiogram  03/2010    EF 50-55%, normal LV size, no WMAs   Past Medical History  Diagnosis Date  . Fibromyalgia   . Bipolar 1 disorder   . Spinal stenosis   . Osteoarthritis   . Hypertension   . Obesity   . GERD (gastroesophageal reflux disease)   . PUD (peptic ulcer disease)   . Anxiety    BP 120/80 mmHg  Pulse 76  Resp 14  SpO2 96%  Opioid Risk Score:   Fall Risk Score:  `1  Depression screen PHQ 2/9  Depression screen PHQ 2/9 01/02/2015  Decreased Interest 1  Down, Depressed, Hopeless 1  PHQ - 2 Score 2  Altered sleeping 2  Tired, decreased energy 1  Change in appetite 1  Feeling bad or failure about yourself  1  Trouble concentrating 0  Moving slowly or fidgety/restless 0  Suicidal thoughts 0  PHQ-9 Score 7     Review of Systems  Constitutional: Negative.   HENT: Negative.   Eyes: Negative.   Respiratory: Positive for cough.   Cardiovascular: Negative.   Gastrointestinal: Negative.   Endocrine: Negative.  Genitourinary: Negative.   Musculoskeletal: Positive for back pain.       Knee and shoulder pain  Skin: Negative.   Allergic/Immunologic: Negative.   Neurological: Positive for tremors.       Tingling  Hematological: Negative.   Psychiatric/Behavioral: Positive for dysphoric mood. The patient is nervous/anxious.        Objective:   Physical Exam  Constitutional: She is oriented to person, place, and time. She appears well-developed and well-nourished.  HENT:  Head: Normocephalic and atraumatic.  Neck: Normal range of motion. Neck supple.  Cervical Paraspinal Tenderness: C-3- C-4  Cardiovascular: Normal rate and regular rhythm.   Pulmonary/Chest: Effort normal and breath sounds normal.  Musculoskeletal:  Normal Muscle Bulk and  Muscle Testing Reveals: Upper Extremities: Right: Full ROM and Muscle Strength 5/5 Left: Decreased ROM 10 Degrees/ Left AC Joint and Deltoid Tenderness   Neurological: She is alert and oriented to person, place, and time.  Skin: Skin is warm and dry.  Psychiatric: She has a normal mood and affect.  Nursing note and vitals reviewed.         Assessment & Plan:  1. Fibromyalgia Syndrome: Continue with exercise and heat therapy.  2. Osteoarthritis of Bilateral Knees: Continue with lidocaine ointment, heat and exercise therapy.  3. Left Shoulder OA: Continue with Exercise and heat therapy and Medication regime. RX: Referral to Orthopedics 4. Bipolar Syndrome: On Depakote and Cymbalta.  5. Lumbar Spondylosis: Increased this month to  Oxycodone 15mg  one tablet every 8 hours as needed #80.  6. Insomnia: RX: Elavil 50 mg HS  20 minutes of face to face patient care time was spent during this visit. All questions were encouraged and answered.

## 2015-01-03 ENCOUNTER — Encounter: Payer: Medicare HMO | Admitting: Registered Nurse

## 2015-01-31 ENCOUNTER — Encounter: Payer: Medicare HMO | Admitting: Registered Nurse

## 2015-02-01 ENCOUNTER — Encounter: Payer: Medicare HMO | Admitting: Registered Nurse

## 2015-02-02 ENCOUNTER — Encounter: Payer: Medicare HMO | Attending: Physical Medicine and Rehabilitation | Admitting: Registered Nurse

## 2015-02-02 ENCOUNTER — Encounter: Payer: Self-pay | Admitting: Registered Nurse

## 2015-02-02 VITALS — BP 140/68 | HR 84 | Resp 14

## 2015-02-02 DIAGNOSIS — M47817 Spondylosis without myelopathy or radiculopathy, lumbosacral region: Secondary | ICD-10-CM | POA: Diagnosis present

## 2015-02-02 DIAGNOSIS — Z5181 Encounter for therapeutic drug level monitoring: Secondary | ICD-10-CM | POA: Diagnosis present

## 2015-02-02 DIAGNOSIS — M609 Myositis, unspecified: Secondary | ICD-10-CM | POA: Diagnosis present

## 2015-02-02 DIAGNOSIS — M19012 Primary osteoarthritis, left shoulder: Secondary | ICD-10-CM | POA: Diagnosis present

## 2015-02-02 DIAGNOSIS — M12812 Other specific arthropathies, not elsewhere classified, left shoulder: Secondary | ICD-10-CM

## 2015-02-02 DIAGNOSIS — M19212 Secondary osteoarthritis, left shoulder: Secondary | ICD-10-CM | POA: Diagnosis not present

## 2015-02-02 DIAGNOSIS — M19019 Primary osteoarthritis, unspecified shoulder: Secondary | ICD-10-CM | POA: Insufficient documentation

## 2015-02-02 DIAGNOSIS — M791 Myalgia: Secondary | ICD-10-CM

## 2015-02-02 DIAGNOSIS — Z79899 Other long term (current) drug therapy: Secondary | ICD-10-CM | POA: Insufficient documentation

## 2015-02-02 DIAGNOSIS — M25512 Pain in left shoulder: Secondary | ICD-10-CM

## 2015-02-02 DIAGNOSIS — M12512 Traumatic arthropathy, left shoulder: Secondary | ICD-10-CM | POA: Diagnosis present

## 2015-02-02 DIAGNOSIS — M170 Bilateral primary osteoarthritis of knee: Secondary | ICD-10-CM

## 2015-02-02 DIAGNOSIS — M129 Arthropathy, unspecified: Secondary | ICD-10-CM | POA: Diagnosis present

## 2015-02-02 DIAGNOSIS — IMO0001 Reserved for inherently not codable concepts without codable children: Secondary | ICD-10-CM

## 2015-02-02 DIAGNOSIS — M75102 Unspecified rotator cuff tear or rupture of left shoulder, not specified as traumatic: Secondary | ICD-10-CM

## 2015-02-02 MED ORDER — OXYCODONE HCL 15 MG PO TABS: 15.0000 mg | ORAL_TABLET | Freq: Three times a day (TID) | ORAL | Status: DC | PRN

## 2015-02-02 NOTE — Progress Notes (Signed)
Subjective:    Patient ID: [name], female    DOB: 1966-06-02, 49 y.o.   MRN: [id number]  HPI: [name] is a 49year old female who returns for follow up for chronic pain and medication refill. She says her pain is located in her left shoulder and lower back.She rates her pain 5. She will be starting  water aeroboics four times a week. Also states she went to [facility] and [employer] received a left shoulder cortisone injection. Received minimal relief.   Pain Inventory Average Pain 5 Pain Right Now 5 My pain is constant, sharp, stabbing and aching  In the last 24 hours, has pain interfered with the following? General activity 4 Relation with others 5 Enjoyment of life 5 What TIME of day is your pain at its worst? night Sleep (in general) Fair  Pain is worse with: walking and sitting Pain improves with: rest, heat/ice and medication Relief from Meds: 4  Mobility walk without assistance how many minutes can you walk? 20-30 ability to climb steps?  yes do you drive?  yes  Function disabled: date disabled .  Neuro/Psych anxiety  Prior Studies Any changes since last visit?  no  Physicians involved in your care Any changes since last visit?  no   Family History  Problem Relation Age of Onset  . Diabetes Other   . Hypertension Other   . Schizophrenia Mother   . Hypertension Mother   . Stroke Father    History   Social History  . Marital Status: Divorced    Spouse Name: N/A  . Number of Children: N/A  . Years of Education: N/A   Social History Main Topics  . Smoking status: Current Every Day Smoker -- 0.50 packs/day    Types: Cigarettes  . Smokeless tobacco: Never Used  . Alcohol Use: No  . Drug Use: No  . Sexual Activity: Not on file   Other Topics Concern  . None   Social History Narrative   Past Surgical History  Procedure Laterality Date  . Abdominal hysterectomy    . Arthroscopic knee    . Cardiovascular stress test   03/2010    Lexiscan Myoview: EF 55%, mild breast attenuation, no ischemia or scar  . Transthoracic echocardiogram  03/2010    EF 50-55%, normal LV size, no WMAs   Past Medical History  Diagnosis Date  . Fibromyalgia   . Bipolar 1 disorder   . Spinal stenosis   . Osteoarthritis   . Hypertension   . Obesity   . GERD (gastroesophageal reflux disease)   . PUD (peptic ulcer disease)   . Anxiety    BP 140/68 mmHg  Pulse 84  Resp 14  SpO2 98%  Opioid Risk Score:   Fall Risk Score: Low Fall Risk (0-5 points)`1  Depression screen PHQ 2/9  Depression screen PHQ 2/9 01/02/2015  Decreased Interest 1  Down, Depressed, Hopeless 1  PHQ - 2 Score 2  Altered sleeping 2  Tired, decreased energy 1  Change in appetite 1  Feeling bad or failure about yourself  1  Trouble concentrating 0  Moving slowly or fidgety/restless 0  Suicidal thoughts 0  PHQ-9 Score 7     Review of Systems  Constitutional: Negative.   HENT: Negative.   Eyes: Negative.   Respiratory: Negative.   Cardiovascular: Negative.   Gastrointestinal: Negative.   Endocrine: Negative.   Genitourinary: Negative.   Musculoskeletal: Positive for back pain and arthralgias.  Left shoulder pain  Skin: Negative.   Allergic/Immunologic: Negative.   Neurological: Negative.   Hematological: Negative.   Psychiatric/Behavioral: The patient is nervous/anxious.        Objective:   Physical Exam  Constitutional: She is oriented to person, place, and time. She appears well-developed and well-nourished.  HENT:  Head: Normocephalic and atraumatic.  Neck: Normal range of motion. Neck supple.  Cardiovascular: Normal rate and regular rhythm.   Pulmonary/Chest: Effort normal and breath sounds normal.  Musculoskeletal:  Normal Muscle Bulk and Muscle Testing Reveals: Upper Extremities: Right: Full ROM and Muscle Strength 5/5 Left: Decreased ROM 45 Degrees and Muscle Strength 5/5 Left AC Joint Tenderness with Palpation Left  Spine of Scapula Tenderness Lumbar Paraspinal Tenderness: L-4- L-5 Sacral Tenderness Lower Extremities: Full ROM and Muscle Strength 5/5 Left Lower Extremity Flexion Produces Pain into Patella Popping sound noted with flexion Arises from chair with ease Antalgic Gait  Neurological: She is alert and oriented to person, place, and time.  Skin: Skin is warm and dry.  Psychiatric: She has a normal mood and affect.  Nursing note and vitals reviewed.         Assessment & Plan:  1. Fibromyalgia Syndrome: Continue with exercise and heat therapy.  2. Osteoarthritis of Bilateral Knees:  Orthopedist Following: Weston Anna Continue with lidocaine ointment, heat and exercise therapy.  3. Left Shoulder OA: S/P Cortisone Injection : Orthopedist Following. Continue with Exercise and heat therapy and Medication regime.  4. Bipolar Syndrome: On Depakote and Cymbalta.  5. Lumbar Spondylosis: Refilled:  Oxycodone 15mg  one tablet every 8 hours as needed #80.  6. Insomnia: ContinueElavil 50 mg HS  20 minutes of face to face patient care time was spent during this visit. All questions were encouraged and answered.

## 2015-02-28 ENCOUNTER — Encounter: Payer: Medicare HMO | Attending: Physical Medicine and Rehabilitation | Admitting: Physical Medicine & Rehabilitation

## 2015-02-28 ENCOUNTER — Encounter: Payer: Self-pay | Admitting: Physical Medicine & Rehabilitation

## 2015-02-28 ENCOUNTER — Other Ambulatory Visit: Payer: Self-pay | Admitting: Physical Medicine & Rehabilitation

## 2015-02-28 VITALS — BP 131/65 | HR 103 | Resp 16

## 2015-02-28 DIAGNOSIS — Z79899 Other long term (current) drug therapy: Secondary | ICD-10-CM | POA: Diagnosis present

## 2015-02-28 DIAGNOSIS — M19019 Primary osteoarthritis, unspecified shoulder: Secondary | ICD-10-CM | POA: Diagnosis present

## 2015-02-28 DIAGNOSIS — M47816 Spondylosis without myelopathy or radiculopathy, lumbar region: Secondary | ICD-10-CM

## 2015-02-28 DIAGNOSIS — R251 Tremor, unspecified: Secondary | ICD-10-CM

## 2015-02-28 DIAGNOSIS — M19012 Primary osteoarthritis, left shoulder: Secondary | ICD-10-CM | POA: Insufficient documentation

## 2015-02-28 DIAGNOSIS — Z5181 Encounter for therapeutic drug level monitoring: Secondary | ICD-10-CM | POA: Diagnosis present

## 2015-02-28 DIAGNOSIS — M609 Myositis, unspecified: Secondary | ICD-10-CM | POA: Diagnosis present

## 2015-02-28 DIAGNOSIS — M47817 Spondylosis without myelopathy or radiculopathy, lumbosacral region: Secondary | ICD-10-CM | POA: Insufficient documentation

## 2015-02-28 DIAGNOSIS — M791 Myalgia: Secondary | ICD-10-CM | POA: Diagnosis present

## 2015-02-28 DIAGNOSIS — M129 Arthropathy, unspecified: Secondary | ICD-10-CM | POA: Insufficient documentation

## 2015-02-28 DIAGNOSIS — M170 Bilateral primary osteoarthritis of knee: Secondary | ICD-10-CM

## 2015-02-28 DIAGNOSIS — G894 Chronic pain syndrome: Secondary | ICD-10-CM | POA: Diagnosis not present

## 2015-02-28 DIAGNOSIS — M12512 Traumatic arthropathy, left shoulder: Secondary | ICD-10-CM

## 2015-02-28 DIAGNOSIS — F319 Bipolar disorder, unspecified: Secondary | ICD-10-CM

## 2015-02-28 DIAGNOSIS — M12812 Other specific arthropathies, not elsewhere classified, left shoulder: Secondary | ICD-10-CM

## 2015-02-28 DIAGNOSIS — M75102 Unspecified rotator cuff tear or rupture of left shoulder, not specified as traumatic: Secondary | ICD-10-CM

## 2015-02-28 MED ORDER — OXYCODONE HCL 15 MG PO TABS: 15.0000 mg | ORAL_TABLET | Freq: Three times a day (TID) | ORAL | Status: DC | PRN

## 2015-02-28 MED ORDER — MORPHINE SULFATE ER 15 MG PO TBCR
15.0000 mg | EXTENDED_RELEASE_TABLET | Freq: Two times a day (BID) | ORAL | Status: DC
Start: 2015-02-28 — End: 2015-04-03

## 2015-02-28 MED ORDER — MORPHINE SULFATE ER 30 MG PO TBCR: 30.0000 mg | EXTENDED_RELEASE_TABLET | Freq: Two times a day (BID) | ORAL | Status: DC

## 2015-02-28 NOTE — Progress Notes (Signed)
Subjective:    Patient ID: [name], female    DOB: 12-Dec-1965, 49 y.o.   MRN: [id number]  HPI  Georgena is here in follow up of her chronic pain. She is  having tremors in her hands and feet. They have been happening at any time of the day.  Her father has parkinsons and she is concerned that this is now happening to her.   Her pain levels have been fairly stable. Dr. Dorothey Baseman has been following her for her left shoulder. . She had an injection recently which helpe a bit.   Pain Inventory Average Pain 7 Pain Right Now 6 My pain is burning, dull and aching  In the last 24 hours, has pain interfered with the following? General activity 7 Relation with others 7 Enjoyment of life 7 What TIME of day is your pain at its worst? evening Sleep (in general) Good  Pain is worse with: walking and sitting Pain improves with: rest and medication Relief from Meds: 6  Mobility walk without assistance ability to climb steps?  no do you drive?  yes  Function Do you have any goals in this area?  no  Neuro/Psych tremor  Prior Studies Any changes since last visit?  no  Physicians involved in your care Any changes since last visit?  no   Family History  Problem Relation Age of Onset  . Diabetes Other   . Hypertension Other   . Schizophrenia Mother   . Hypertension Mother   . Stroke Father    History   Social History  . Marital Status: Divorced    Spouse Name: N/A  . Number of Children: N/A  . Years of Education: N/A   Social History Main Topics  . Smoking status: Current Every Day Smoker -- 0.50 packs/day    Types: Cigarettes  . Smokeless tobacco: Never Used  . Alcohol Use: No  . Drug Use: No  . Sexual Activity: Not on file   Other Topics Concern  . None   Social History Narrative   Past Surgical History  Procedure Laterality Date  . Abdominal hysterectomy    . Arthroscopic knee    . Cardiovascular stress test  03/2010    Lexiscan Myoview: EF 55%, mild  breast attenuation, no ischemia or scar  . Transthoracic echocardiogram  03/2010    EF 50-55%, normal LV size, no WMAs   Past Medical History  Diagnosis Date  . Fibromyalgia   . Bipolar 1 disorder   . Spinal stenosis   . Osteoarthritis   . Hypertension   . Obesity   . GERD (gastroesophageal reflux disease)   . PUD (peptic ulcer disease)   . Anxiety    BP 131/65 mmHg  Pulse 103  Resp 16  SpO2 95%  Opioid Risk Score:   Fall Risk Score: Moderate Fall Risk (6-13 points) (re educated and given handout)`1  Depression screen PHQ 2/9  Depression screen PHQ 2/9 01/02/2015  Decreased Interest 1  Down, Depressed, Hopeless 1  PHQ - 2 Score 2  Altered sleeping 2  Tired, decreased energy 1  Change in appetite 1  Feeling bad or failure about yourself  1  Trouble concentrating 0  Moving slowly or fidgety/restless 0  Suicidal thoughts 0  PHQ-9 Score 7      Review of Systems  Neurological: Positive for tremors.  All other systems reviewed and are negative.      Objective:   Physical Exam  General: Alert and oriented x 3,  No apparent distress. obese  HEENT: Head is normocephalic, atraumatic, PERRLA, EOMI, sclera anicteric, oral mucosa pink and moist, dentition intact, ext ear canals clear,  Neck: Supple without JVD or lymphadenopathy  Heart: Reg rate and rhythm. No murmurs rubs or gallops  Chest: CTA bilaterally without wheezes, rales, or rhonchi; no distress  Abdomen: Soft, non-tender, non-distended, bowel sounds positive.  Extremities: No clubbing, cyanosis, or edema. Pulses are 2+  Skin: Clean and intact without signs of breakdown. Numerous tattoos noted.  Neuro: Pt is cognitively appropriate with normal insight, memory, and awareness. Cranial nerves 2-12 are intact. Sensory exam is normal. Reflexes are 2+ in all 4's except for the achilles which were trace to 1+. Fine motor coordination is intact. Mild, fine resting and intentional hand tremors. Motor function is  grossly 5/5 except for inhibition at the left shoulder.   Musculoskeletal: Left shoulder pain with ER, IR and abduction. She could not abduct the shoulder past 45 degrees before she had substantial pain--pt resisted movement.lumbar spine tender along l4-s1 areas bilaterally with palpation. Extension produced more pain than flexion. Mild crepitus right knee and moderate on the left. She had medial joint line pain right knee and med/lat joint line pain on left. Meniscal maneuvers provoked pain in the medial right knee and med/lat on the left.  Psych:Affect was pleasant and appropriate.a little edgy as she typically is.   Assessment & Plan:   1. Fibromyalgia syndrome  2. Osteoarthritis of bilateral knees  3. 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 Bipolar syndrome   5. Lumbar spondylosis with facet arthropathy  6. Insomnia  7. Tremors   Plan:  1. Continue with regular exercise and stretching at home.  2. Can consider lumbar facet injections. Would need lumbar spine films at least, first.  These were ordered today. 3. Begin trial of MS contin 15mg  q12 #60.  4. Cymbalta---continue for mood and pain effects. I believe this has helped  5. Continue surgical follow up for her left shoulder. I believe she's seen [place] [facility] in the past.  6. elavil 50 mg qhs for sleep/pain has been effective. 7. Flexeril for muscle spasms and sleep. .  8. Regarding her tremors, we discussed potential causes. Certainly with the family hx of PD, it should be in consideration, but there other potential sources including her meds and bipolar disorder. The tremors are mild at this point and she will observe for now.  9. Follow up with me or my NP in 1 months. 30 minutes of face to face patient care time were spent during this visit. All questions were encouraged and answered. I

## 2015-02-28 NOTE — Patient Instructions (Signed)
PLEASE CALL ME WITH ANY PROBLEMS OR QUESTIONS (#[phone]).      

## 2015-03-02 LAB — PMP ALCOHOL METABOLITE (ETG): Ethyl Glucuronide (EtG): NEGATIVE ng/mL

## 2015-03-04 LAB — OXYCODONE, URINE (LC/MS-MS)
Noroxycodone, Ur: 6581 ng/mL (ref <50)
Oxycodone, ur: 7477 ng/mL (ref <50)
Oxymorphone: 861 ng/mL (ref <50)

## 2015-03-04 LAB — OPIATES/OPIOIDS (LC/MS-MS)
Codeine Urine: NEGATIVE ng/mL (ref <50)
HYDROMORPHONE: NEGATIVE ng/mL (ref <50)
Hydrocodone: NEGATIVE ng/mL (ref <50)
MORPHINE: NEGATIVE ng/mL (ref <50)
Norhydrocodone, Ur: NEGATIVE ng/mL (ref <50)
Noroxycodone, Ur: 6581 ng/mL (ref <50)
Oxycodone, ur: 7477 ng/mL (ref <50)
Oxymorphone: 861 ng/mL (ref <50)

## 2015-03-04 LAB — BENZODIAZEPINES (GC/LC/MS), URINE
Alprazolam metabolite (GC/LC/MS), ur confirm: 335 ng/mL (ref <25)
Clonazepam metabolite (GC/LC/MS), ur confirm: NEGATIVE ng/mL (ref <25)
Flurazepam metabolite (GC/LC/MS), ur confirm: NEGATIVE ng/mL (ref <50)
LORAZEPAMU: NEGATIVE ng/mL (ref <50)
Midazolam (GC/LC/MS), ur confirm: NEGATIVE ng/mL (ref <50)
Nordiazepam (GC/LC/MS), ur confirm: NEGATIVE ng/mL (ref <50)
OXAZEPAMU: NEGATIVE ng/mL (ref <50)
TEMAZEPAMU: NEGATIVE ng/mL (ref <50)
Triazolam metabolite (GC/LC/MS), ur confirm: NEGATIVE ng/mL (ref <50)

## 2015-03-05 ENCOUNTER — Telehealth: Payer: Self-pay | Admitting: *Deleted

## 2015-03-05 NOTE — Telephone Encounter (Signed)
A letter is being mailed to [name] as a formal warning about frequent cancellations,  Since 10/2014 she has had 5 cancellations.

## 2015-03-06 LAB — PRESCRIPTION MONITORING PROFILE (SOLSTAS)
AMPHETAMINE/METH: NEGATIVE ng/mL
Barbiturate Screen, Urine: NEGATIVE ng/mL
Buprenorphine, Urine: NEGATIVE ng/mL
CARISOPRODOL, URINE: NEGATIVE ng/mL
COCAINE METABOLITES: NEGATIVE ng/mL
Cannabinoid Scrn, Ur: NEGATIVE ng/mL
Creatinine, Urine: 208.95 mg/dL (ref >20.0)
ECSTASY: NEGATIVE ng/mL
FENTANYL URINE: NEGATIVE ng/mL
Meperidine, Ur: NEGATIVE ng/mL
Methadone Screen, Urine: NEGATIVE ng/mL
NITRITES URINE, INITIAL: NEGATIVE ug/mL
PROPOXYPHENE: NEGATIVE ng/mL
Tapentadol, urine: NEGATIVE ng/mL
Tramadol Scrn, Ur: NEGATIVE ng/mL
Zolpidem, Urine: NEGATIVE ng/mL
pH, Initial: 6.2 pH (ref 4.5–8.9)

## 2015-03-14 NOTE — Progress Notes (Signed)
Urine drug screen for this encounter is consistent for prescribed medication 

## 2015-03-15 ENCOUNTER — Telehealth: Payer: Self-pay | Admitting: *Deleted

## 2015-03-15 MED ORDER — AMITRIPTYLINE HCL 50 MG PO TABS: ORAL_TABLET | ORAL | Status: DC

## 2015-03-15 NOTE — Telephone Encounter (Signed)
[facility] called to speak to [employer] about a medication. Her amitriptylin has been filled with 25  Mg and she is having to take two.  I looked at the order and refills for 50 went to a mail order pharmacy.  I will send a new order to her local pharmacy.

## 2015-04-02 ENCOUNTER — Encounter: Payer: Medicare HMO | Admitting: Registered Nurse

## 2015-04-03 ENCOUNTER — Encounter: Payer: Medicare HMO | Attending: Physical Medicine and Rehabilitation | Admitting: Registered Nurse

## 2015-04-03 ENCOUNTER — Encounter: Payer: Self-pay | Admitting: Registered Nurse

## 2015-04-03 VITALS — BP 139/80 | HR 93 | Resp 16

## 2015-04-03 DIAGNOSIS — M12812 Other specific arthropathies, not elsewhere classified, left shoulder: Secondary | ICD-10-CM

## 2015-04-03 DIAGNOSIS — M791 Myalgia: Secondary | ICD-10-CM | POA: Diagnosis present

## 2015-04-03 DIAGNOSIS — M62838 Other muscle spasm: Secondary | ICD-10-CM

## 2015-04-03 DIAGNOSIS — Z79899 Other long term (current) drug therapy: Secondary | ICD-10-CM | POA: Diagnosis present

## 2015-04-03 DIAGNOSIS — M609 Myositis, unspecified: Secondary | ICD-10-CM | POA: Diagnosis present

## 2015-04-03 DIAGNOSIS — M47817 Spondylosis without myelopathy or radiculopathy, lumbosacral region: Secondary | ICD-10-CM | POA: Diagnosis present

## 2015-04-03 DIAGNOSIS — M129 Arthropathy, unspecified: Secondary | ICD-10-CM | POA: Insufficient documentation

## 2015-04-03 DIAGNOSIS — IMO0001 Reserved for inherently not codable concepts without codable children: Secondary | ICD-10-CM

## 2015-04-03 DIAGNOSIS — M19019 Primary osteoarthritis, unspecified shoulder: Secondary | ICD-10-CM | POA: Insufficient documentation

## 2015-04-03 DIAGNOSIS — Z5181 Encounter for therapeutic drug level monitoring: Secondary | ICD-10-CM | POA: Diagnosis present

## 2015-04-03 DIAGNOSIS — M19012 Primary osteoarthritis, left shoulder: Secondary | ICD-10-CM | POA: Insufficient documentation

## 2015-04-03 DIAGNOSIS — R251 Tremor, unspecified: Secondary | ICD-10-CM

## 2015-04-03 DIAGNOSIS — M12512 Traumatic arthropathy, left shoulder: Secondary | ICD-10-CM | POA: Diagnosis present

## 2015-04-03 DIAGNOSIS — F319 Bipolar disorder, unspecified: Secondary | ICD-10-CM

## 2015-04-03 DIAGNOSIS — M170 Bilateral primary osteoarthritis of knee: Secondary | ICD-10-CM

## 2015-04-03 DIAGNOSIS — G894 Chronic pain syndrome: Secondary | ICD-10-CM

## 2015-04-03 DIAGNOSIS — M75102 Unspecified rotator cuff tear or rupture of left shoulder, not specified as traumatic: Secondary | ICD-10-CM

## 2015-04-03 MED ORDER — BACLOFEN 10 MG PO TABS: 10.0000 mg | ORAL_TABLET | Freq: Two times a day (BID) | ORAL | Status: DC | PRN

## 2015-04-03 MED ORDER — MORPHINE SULFATE ER 15 MG PO TBCR: 15.0000 mg | EXTENDED_RELEASE_TABLET | Freq: Two times a day (BID) | ORAL | Status: DC

## 2015-04-03 MED ORDER — OXYCODONE HCL 15 MG PO TABS: 15.0000 mg | ORAL_TABLET | Freq: Three times a day (TID) | ORAL | Status: DC | PRN

## 2015-04-03 NOTE — Progress Notes (Signed)
Subjective:    Patient ID: [name], female    DOB: Mar 09, 1966, 49 y.o.   MRN: [id number]  HPI: [name] is a 49year old female who returns for follow up for chronic pain and medication refill. She says her pain is located in her left shoulder, lower back and bilateral knee's. Also states she has noticed increased  Tremors in her extremities. States has a family history of parkinson and voiced concern's. Will order a referral to neurology, she verbalizes understanding.She rates her pain 8. Her current exercise regime is walking also encouraged to use her silver sneaker she verbalizes understanding.  Will follow up with [name] and [name] regarding her knees she states.  Pain Inventory Average Pain 7 Pain Right Now 8 My pain is sharp, burning, stabbing, tingling and aching  In the last 24 hours, has pain interfered with the following? General activity 7 Relation with others 5 Enjoyment of life 7 What TIME of day is your pain at its worst? morning Sleep (in general) Good  Pain is worse with: walking and sitting Pain improves with: rest, heat/ice and medication Relief from Meds: 6  Mobility walk without assistance ability to climb steps?  no do you drive?  yes  Function I need assistance with the following:  bathing and shopping  Neuro/Psych anxiety  Prior Studies Any changes since last visit?  no  Physicians involved in your care Any changes since last visit?  no   Family History  Problem Relation Age of Onset  . Diabetes Other   . Hypertension Other   . Schizophrenia Mother   . Hypertension Mother   . Stroke Father    History   Social History  . Marital Status: Divorced    Spouse Name: N/A  . Number of Children: N/A  . Years of Education: N/A   Social History Main Topics  . Smoking status: Current Every Day Smoker -- 0.50 packs/day    Types: Cigarettes  . Smokeless tobacco: Never Used  . Alcohol Use: No  . Drug Use: No  .  Sexual Activity: Not on file   Other Topics Concern  . None   Social History Narrative   Past Surgical History  Procedure Laterality Date  . Abdominal hysterectomy    . Arthroscopic knee    . Cardiovascular stress test  03/2010    Lexiscan Myoview: EF 55%, mild breast attenuation, no ischemia or scar  . Transthoracic echocardiogram  03/2010    EF 50-55%, normal LV size, no WMAs   Past Medical History  Diagnosis Date  . Fibromyalgia   . Bipolar 1 disorder   . Spinal stenosis   . Osteoarthritis   . Hypertension   . Obesity   . GERD (gastroesophageal reflux disease)   . PUD (peptic ulcer disease)   . Anxiety    BP 139/80 mmHg  Pulse 93  Resp 16  SpO2 94%  Opioid Risk Score:   Fall Risk Score: Low Fall Risk (0-5 points)`1  Depression screen PHQ 2/9  Depression screen PHQ 2/9 01/02/2015  Decreased Interest 1  Down, Depressed, Hopeless 1  PHQ - 2 Score 2  Altered sleeping 2  Tired, decreased energy 1  Change in appetite 1  Feeling bad or failure about yourself  1  Trouble concentrating 0  Moving slowly or fidgety/restless 0  Suicidal thoughts 0  PHQ-9 Score 7     Review of Systems  Constitutional: Positive for unexpected weight change.  Psychiatric/Behavioral: The patient is  nervous/anxious.   All other systems reviewed and are negative.      Objective:   Physical Exam  Constitutional: She is oriented to person, place, and time. She appears well-developed and well-nourished.  HENT:  Head: Normocephalic and atraumatic.  Neck: Normal range of motion. Neck supple.  Cardiovascular: Normal rate and regular rhythm.   Pulmonary/Chest: Effort normal and breath sounds normal.  Musculoskeletal:  Normal Muscle Bulk and Muscle Testing Reveals: Upper Extremities: Right: Full ROM and Muscle Strength 5/5 Left: Decreased ROM 90 Degrees and Muscle Strength 5/5 Back without spinal or paraspinal tenderness Lower Extremities: Full ROM and Muscle Strength 5/5 Arises from  chair with ease Waddling Gait  Neurological: She is alert and oriented to person, place, and time.  Skin: Skin is warm and dry.  Psychiatric: She has a normal mood and affect.  Nursing note and vitals reviewed.         Assessment & Plan:  1. Fibromyalgia Syndrome: Continue with exercise and heat therapy.  2. Osteoarthritis of Bilateral Knees:  Orthopedist Following: [name] Continue with lidocaine ointment, heat and exercise therapy.  3. Left Shoulder OA: Orthopedist Following. Continue with Exercise and heat therapy and Medication regime.  4. Bipolar Syndrome: On Depakote and Cymbalta.  5. Lumbar Spondylosis: Refilled: Oxycodone 15mg  one tablet every 8 hours as needed #80. MS Contin 15 mg one tablet every 12 hours #60. 6. Insomnia: ContinueElavil 50 mg HS 7. Tremors: Referral to Neurology  20 minutes of face to face patient care time was spent during this visit. All questions were encouraged and answered.

## 2015-04-12 ENCOUNTER — Telehealth: Payer: Self-pay | Admitting: Physical Medicine & Rehabilitation

## 2015-04-17 ENCOUNTER — Other Ambulatory Visit: Payer: Self-pay | Admitting: Registered Nurse

## 2015-04-18 ENCOUNTER — Telehealth: Payer: Self-pay | Admitting: *Deleted

## 2015-04-18 ENCOUNTER — Other Ambulatory Visit: Payer: Self-pay | Admitting: *Deleted

## 2015-04-18 MED ORDER — AMITRIPTYLINE HCL 50 MG PO TABS: ORAL_TABLET | ORAL | Status: DC

## 2015-04-18 NOTE — Telephone Encounter (Signed)
[facility] called to report she was having problems with her shoulder, unable to reach or bend arm.  We have no appointments available and she was advised to go to urgent care for evaluation.  She agreed.

## 2015-04-18 NOTE — Telephone Encounter (Signed)
done

## 2015-04-18 NOTE — Telephone Encounter (Signed)
[name] says [facility] says that her amitriptyline is 25 mg.  I checked and the order was for 50 mg 03/15/15 with 2 refills and shows it was received by the pharmacy at 4:08 pm.

## 2015-04-18 NOTE — Addendum Note (Signed)
Addended by: Caro Hight on: 04/18/2015 10:54 AM   Modules accepted: Orders

## 2015-04-26 ENCOUNTER — Encounter: Payer: Medicare HMO | Attending: Physical Medicine and Rehabilitation | Admitting: Registered Nurse

## 2015-04-26 ENCOUNTER — Encounter: Payer: Self-pay | Admitting: Registered Nurse

## 2015-04-26 VITALS — BP 94/57 | HR 90 | Resp 14

## 2015-04-26 DIAGNOSIS — M19012 Primary osteoarthritis, left shoulder: Secondary | ICD-10-CM | POA: Diagnosis present

## 2015-04-26 DIAGNOSIS — Z79899 Other long term (current) drug therapy: Secondary | ICD-10-CM | POA: Insufficient documentation

## 2015-04-26 DIAGNOSIS — M129 Arthropathy, unspecified: Secondary | ICD-10-CM | POA: Insufficient documentation

## 2015-04-26 DIAGNOSIS — M609 Myositis, unspecified: Secondary | ICD-10-CM | POA: Diagnosis present

## 2015-04-26 DIAGNOSIS — Z5181 Encounter for therapeutic drug level monitoring: Secondary | ICD-10-CM | POA: Diagnosis present

## 2015-04-26 DIAGNOSIS — M19019 Primary osteoarthritis, unspecified shoulder: Secondary | ICD-10-CM | POA: Diagnosis present

## 2015-04-26 DIAGNOSIS — M12512 Traumatic arthropathy, left shoulder: Secondary | ICD-10-CM | POA: Insufficient documentation

## 2015-04-26 DIAGNOSIS — M170 Bilateral primary osteoarthritis of knee: Secondary | ICD-10-CM

## 2015-04-26 DIAGNOSIS — M47817 Spondylosis without myelopathy or radiculopathy, lumbosacral region: Secondary | ICD-10-CM | POA: Diagnosis present

## 2015-04-26 DIAGNOSIS — M791 Myalgia: Secondary | ICD-10-CM | POA: Insufficient documentation

## 2015-04-26 DIAGNOSIS — M7552 Bursitis of left shoulder: Secondary | ICD-10-CM

## 2015-04-26 DIAGNOSIS — G894 Chronic pain syndrome: Secondary | ICD-10-CM | POA: Diagnosis not present

## 2015-04-26 DIAGNOSIS — M75102 Unspecified rotator cuff tear or rupture of left shoulder, not specified as traumatic: Secondary | ICD-10-CM

## 2015-04-26 DIAGNOSIS — M12812 Other specific arthropathies, not elsewhere classified, left shoulder: Secondary | ICD-10-CM

## 2015-04-26 DIAGNOSIS — F319 Bipolar disorder, unspecified: Secondary | ICD-10-CM

## 2015-04-26 MED ORDER — METHYLPREDNISOLONE 4 MG PO TBPK: ORAL_TABLET | ORAL | Status: DC

## 2015-04-26 MED ORDER — MORPHINE SULFATE ER 15 MG PO TBCR: 15.0000 mg | EXTENDED_RELEASE_TABLET | Freq: Two times a day (BID) | ORAL | Status: DC

## 2015-04-26 MED ORDER — OXYCODONE HCL 15 MG PO TABS: 15.0000 mg | ORAL_TABLET | Freq: Three times a day (TID) | ORAL | Status: DC | PRN

## 2015-04-26 NOTE — Progress Notes (Signed)
Subjective:    Patient ID: [name], female    DOB: Mar 22, 1966, 49 y.o.   MRN: [id number]  HPI: [name] is a 49year old female who returns for follow up for chronic pain and medication refill. She says her pain is located in her left shoulder, lower back and bilateral knee's.She rates her pain 5. Her current exercise regime is walking. She has not followed up with [facility] and [place] due to financial hardship concerning co-pay.   Pain Inventory Average Pain 5 Pain Right Now 5 My pain is burning, stabbing and tingling  In the last 24 hours, has pain interfered with the following? General activity 5 Relation with others 5 Enjoyment of life 6 What TIME of day is your pain at its worst? night Sleep (in general) Good  Pain is worse with: walking and sitting Pain improves with: rest, medication and injections Relief from Meds: 7  Mobility walk without assistance how many minutes can you walk? 15 ability to climb steps?  yes do you drive?  yes Do you have any goals in this area?  no  Function retired Do you have any goals in this area?  no  Neuro/Psych tremor spasms  Prior Studies Any changes since last visit?  no  Physicians involved in your care Any changes since last visit?  no   Family History  Problem Relation Age of Onset  . Diabetes Other   . Hypertension Other   . Schizophrenia Mother   . Hypertension Mother   . Stroke Father    History   Social History  . Marital Status: Divorced    Spouse Name: N/A  . Number of Children: N/A  . Years of Education: N/A   Social History Main Topics  . Smoking status: Current Every Day Smoker -- 0.50 packs/day    Types: Cigarettes  . Smokeless tobacco: Never Used  . Alcohol Use: No  . Drug Use: No  . Sexual Activity: Not on file   Other Topics Concern  . None   Social History Narrative   Past Surgical History  Procedure Laterality Date  . Abdominal hysterectomy    . Arthroscopic  knee    . Cardiovascular stress test  03/2010    Lexiscan Myoview: EF 55%, mild breast attenuation, no ischemia or scar  . Transthoracic echocardiogram  03/2010    EF 50-55%, normal LV size, no WMAs   Past Medical History  Diagnosis Date  . Fibromyalgia   . Bipolar 1 disorder   . Spinal stenosis   . Osteoarthritis   . Hypertension   . Obesity   . GERD (gastroesophageal reflux disease)   . PUD (peptic ulcer disease)   . Anxiety    BP 94/57 mmHg  Pulse 90  Resp 14  SpO2 95%  Opioid Risk Score:   Fall Risk Score: Moderate Fall Risk (6-13 points) (pt declined educational materials)`1  Depression screen PHQ 2/9  Depression screen PHQ 2/9 01/02/2015  Decreased Interest 1  Down, Depressed, Hopeless 1  PHQ - 2 Score 2  Altered sleeping 2  Tired, decreased energy 1  Change in appetite 1  Feeling bad or failure about yourself  1  Trouble concentrating 0  Moving slowly or fidgety/restless 0  Suicidal thoughts 0  PHQ-9 Score 7     Review of Systems  Neurological: Positive for tremors.       Spasms  All other systems reviewed and are negative.      Objective:  Physical Exam  Constitutional: She is oriented to person, place, and time. She appears well-developed and well-nourished.  HENT:  Head: Normocephalic and atraumatic.  Neck: Normal range of motion. Neck supple.  Cardiovascular: Normal rate and regular rhythm.   Pulmonary/Chest: Effort normal and breath sounds normal.  Musculoskeletal:  Normal Muscle Bulk and Muscle Testing Reveals: Upper Extremities:Right:  Full ROM and Muscle Strength 5/5 Left: Decreased ROM 45 Degrees and Muscle Strength 5/5 Left AC Joint Tenderness Lumbar Paraspinal Tenderness: L-3- L-5 Lower Extremities: Full ROM and Muscle Strength 5/5 Left Lower Extremity Flexion Produces Popping sound and Pain into [place] Arises from chair with ease Narrow Based gait   Neurological: She is alert and oriented to person, place, and time.  Skin: Skin  is warm and dry.  Psychiatric: She has a normal mood and affect.  Nursing note and vitals reviewed.         Assessment & Plan:  1. Fibromyalgia Syndrome: Continue with exercise and heat therapy. Encouraged to increase activity 2. Osteoarthritis of Bilateral Knees:  Orthopedist: Awaiting F/U with  [facility] Anna Continue with heat and exercise therapy.  3. Left Shoulder OA: Orthopedist Following. Continue with Exercise and heat therapy and Medication regime.  4. Bipolar Syndrome: On Depakote and Cymbalta.  5. Lumbar Spondylosis: Refilled: Oxycodone 15mg  one tablet every 8 hours as needed #80. MS Contin 15 mg one tablet every 12 hours #60. 6. Left Shoulder Bursitis: RX. Medrol Dose Pak 7. Insomnia: ContinueElavil 50 mg HS  20 minutes of face to face patient care time was spent during this visit. All questions were encouraged and answered.

## 2015-05-02 ENCOUNTER — Ambulatory Visit: Payer: Medicare HMO | Admitting: Neurology

## 2015-05-28 ENCOUNTER — Encounter: Payer: Medicare HMO | Attending: Physical Medicine and Rehabilitation | Admitting: Registered Nurse

## 2015-05-28 ENCOUNTER — Other Ambulatory Visit: Payer: Self-pay | Admitting: Registered Nurse

## 2015-05-28 ENCOUNTER — Encounter: Payer: Self-pay | Admitting: Registered Nurse

## 2015-05-28 VITALS — BP 123/74 | HR 100

## 2015-05-28 DIAGNOSIS — Z5181 Encounter for therapeutic drug level monitoring: Secondary | ICD-10-CM | POA: Insufficient documentation

## 2015-05-28 DIAGNOSIS — M609 Myositis, unspecified: Secondary | ICD-10-CM | POA: Insufficient documentation

## 2015-05-28 DIAGNOSIS — M12512 Traumatic arthropathy, left shoulder: Secondary | ICD-10-CM | POA: Diagnosis present

## 2015-05-28 DIAGNOSIS — M47817 Spondylosis without myelopathy or radiculopathy, lumbosacral region: Secondary | ICD-10-CM | POA: Diagnosis present

## 2015-05-28 DIAGNOSIS — M19019 Primary osteoarthritis, unspecified shoulder: Secondary | ICD-10-CM | POA: Insufficient documentation

## 2015-05-28 DIAGNOSIS — Z79899 Other long term (current) drug therapy: Secondary | ICD-10-CM | POA: Diagnosis present

## 2015-05-28 DIAGNOSIS — M25512 Pain in left shoulder: Secondary | ICD-10-CM

## 2015-05-28 DIAGNOSIS — M19012 Primary osteoarthritis, left shoulder: Secondary | ICD-10-CM | POA: Insufficient documentation

## 2015-05-28 DIAGNOSIS — G894 Chronic pain syndrome: Secondary | ICD-10-CM

## 2015-05-28 DIAGNOSIS — M129 Arthropathy, unspecified: Secondary | ICD-10-CM | POA: Insufficient documentation

## 2015-05-28 DIAGNOSIS — M791 Myalgia: Secondary | ICD-10-CM | POA: Diagnosis present

## 2015-05-28 DIAGNOSIS — F319 Bipolar disorder, unspecified: Secondary | ICD-10-CM

## 2015-05-28 DIAGNOSIS — M62830 Muscle spasm of back: Secondary | ICD-10-CM

## 2015-05-28 DIAGNOSIS — K5909 Other constipation: Secondary | ICD-10-CM

## 2015-05-28 DIAGNOSIS — IMO0001 Reserved for inherently not codable concepts without codable children: Secondary | ICD-10-CM

## 2015-05-28 MED ORDER — OXYCODONE HCL 15 MG PO TABS: 15.0000 mg | ORAL_TABLET | Freq: Three times a day (TID) | ORAL | Status: DC | PRN

## 2015-05-28 MED ORDER — DULOXETINE HCL 60 MG PO CPEP: 60.0000 mg | ORAL_CAPSULE | Freq: Every day | ORAL | Status: DC

## 2015-05-28 MED ORDER — BACLOFEN 10 MG PO TABS: 10.0000 mg | ORAL_TABLET | Freq: Three times a day (TID) | ORAL | Status: DC

## 2015-05-28 MED ORDER — BISACODYL 5 MG PO TBEC: 5.0000 mg | DELAYED_RELEASE_TABLET | Freq: Every day | ORAL | Status: DC | PRN

## 2015-05-28 MED ORDER — MORPHINE SULFATE ER 15 MG PO TBCR: 15.0000 mg | EXTENDED_RELEASE_TABLET | Freq: Two times a day (BID) | ORAL | Status: DC

## 2015-05-28 NOTE — Progress Notes (Signed)
Subjective:    Patient ID: [name], female    DOB: Oct 23, 1965, 49 y.o.   MRN: [id number]  HPI: [name] is a 49year old female who returns for follow up for chronic pain and medication refill. She says her pain is located in her neck, left shoulder and lower back.  Also states she's heaving increase frequency of muscle spasms baclofen increase to three times a day as needed. Also complaining of constipation she will be prescribed bisacodyl she verbalizes understanding. She rates her pain 4. Her current exercise regime is walking. Also states she fell two weeks ago getting out of the bathtub she fell backwards and hit her head. She didn't seek medical attention. Also states her head has been hurting she refuses to go to ED for follow up also admits taking her analgesics more frequently. She's four day's early we will allow her to get her medication early reviewed the narcotic contract with her she verbalizes understanding.  Also has a neurology appointment on 06/21/15 for upper extremities tremors.  She has not followed up with [facility] and [place] due to financial hardship concerning co-pay.   Pain Inventory Average Pain 6 Pain Right Now 4 My pain is burning, stabbing, tingling and aching  In the last 24 hours, has pain interfered with the following? General activity 4 Relation with others 3 Enjoyment of life 4 What TIME of day is your pain at its worst? Morning and Evening Sleep (in general) NA  Pain is worse with: walking, standing and some activites Pain improves with: rest and medication Relief from Meds: 2  Mobility how many minutes can you walk? 20 ability to climb steps?  yes do you drive?  yes Do you have any goals in this area?  yes  Function disabled: date disabled . I need assistance with the following:  bathing Do you have any goals in this area?  yes  Neuro/Psych tremor trouble walking dizziness confusion depression  Prior Studies Any  changes since last visit?  no  Physicians involved in your care Any changes since last visit?  no   Family History  Problem Relation Age of Onset  . Diabetes Other   . Hypertension Other   . Schizophrenia Mother   . Hypertension Mother   . Stroke Father    Social History   Social History  . Marital Status: Divorced    Spouse Name: N/A  . Number of Children: N/A  . Years of Education: N/A   Social History Main Topics  . Smoking status: Current Every Day Smoker -- 0.50 packs/day    Types: Cigarettes  . Smokeless tobacco: Never Used  . Alcohol Use: No  . Drug Use: No  . Sexual Activity: Not Asked   Other Topics Concern  . None   Social History Narrative   Past Surgical History  Procedure Laterality Date  . Abdominal hysterectomy    . Arthroscopic knee    . Cardiovascular stress test  03/2010    Lexiscan Myoview: EF 55%, mild breast attenuation, no ischemia or scar  . Transthoracic echocardiogram  03/2010    EF 50-55%, normal LV size, no WMAs   Past Medical History  Diagnosis Date  . Fibromyalgia   . Bipolar 1 disorder   . Spinal stenosis   . Osteoarthritis   . Hypertension   . Obesity   . GERD (gastroesophageal reflux disease)   . PUD (peptic ulcer disease)   . Anxiety    BP 123/74 mmHg  Pulse 100  SpO2 96%  Opioid Risk Score:   Fall Risk Score:  `1  Depression screen PHQ 2/9  Depression screen [facility] 2/9 05/28/2015 01/02/2015  Decreased Interest 1 1  Down, Depressed, Hopeless 1 1  PHQ - 2 Score 2 2  Altered sleeping - 2  Tired, decreased energy - 1  Change in appetite - 1  Feeling bad or failure about yourself  - 1  Trouble concentrating - 0  Moving slowly or fidgety/restless - 0  Suicidal thoughts - 0  PHQ-9 Score - 7     Review of Systems  Musculoskeletal: Positive for gait problem.  Neurological: Positive for dizziness and tremors.  Psychiatric/Behavioral: Positive for confusion.       Depression  All other systems reviewed and are  negative.      Objective:   Physical Exam  Constitutional: She is oriented to person, place, and time. She appears well-developed and well-nourished.  HENT:  Head: Normocephalic and atraumatic.  Neck: Normal range of motion. Neck supple.  Cardiovascular: Normal rate and regular rhythm.   Pulmonary/Chest: Effort normal and breath sounds normal.  Musculoskeletal:  Normal Muscle Bulk and Muscle Testing Reveals: Upper Extremities: Right:Full ROM and Muscle Strength 5/5 Left: Decreased ROM 45 Degrees and Muscle Strength 5/5 Left AC Joint Tenderness/ Left Deltoid Tenderness Thoracic Paraspinal Tenderness: T-1- T-3 Lumbar Paraspinal Tenderness: L-3- L-5 Lower Extremities: Full ROM and Muscle Strength 5/5 Arises from chair slowly Antalgic gait  Neurological: She is alert and oriented to person, place, and time.  Skin: Skin is warm and dry.  Psychiatric: She has a normal mood and affect.  Nursing note and vitals reviewed.         Assessment & Plan:  1. Fibromyalgia Syndrome: Continue with exercise and heat therapy. Encouraged to increase activity 2. Osteoarthritis of Bilateral Knees:  Orthopedist: Awaiting F/U with [facility] Continue with heat and exercise therapy.  3. Left Shoulder OA: Orthopedist Following. Continue with Exercise and heat therapy and Medication regime.  4. Bipolar Syndrome: On Depakote and Cymbalta.  5. Lumbar Spondylosis: Refilled: Oxycodone 15mg  one tablet every 8 hours as needed #80 and MS Contin 15 mg one tablet every 12 hours #60. 6.Acute Left Shoulder Pain RX. X-ray 7. Insomnia: Continue Elavil 50 mg HS 8. Muscle Spasms: Increased Bacolen TID as needed  9. Constipation: RX: Bisacodyl  30 minutes of face to face patient care time was spent during this visit. All questions were encouraged and answered

## 2015-06-04 ENCOUNTER — Encounter (HOSPITAL_COMMUNITY): Payer: Self-pay

## 2015-06-04 ENCOUNTER — Emergency Department (HOSPITAL_COMMUNITY)
Admission: EM | Admit: 2015-06-04 | Discharge: 2015-06-04 | Disposition: A | Discharge status: Home / Self Care | Payer: Medicare HMO | Attending: Physician Assistant | Admitting: Physician Assistant

## 2015-06-04 ENCOUNTER — Emergency Department (HOSPITAL_COMMUNITY): Payer: Medicare HMO

## 2015-06-04 DIAGNOSIS — Y998 Other external cause status: Secondary | ICD-10-CM | POA: Diagnosis not present

## 2015-06-04 DIAGNOSIS — Y9389 Activity, other specified: Secondary | ICD-10-CM | POA: Diagnosis not present

## 2015-06-04 DIAGNOSIS — Z87440 Personal history of urinary (tract) infections: Secondary | ICD-10-CM | POA: Insufficient documentation

## 2015-06-04 DIAGNOSIS — Z7982 Long term (current) use of aspirin: Secondary | ICD-10-CM | POA: Insufficient documentation

## 2015-06-04 DIAGNOSIS — Z720 Tobacco use: Secondary | ICD-10-CM | POA: Diagnosis not present

## 2015-06-04 DIAGNOSIS — Z8711 Personal history of peptic ulcer disease: Secondary | ICD-10-CM | POA: Diagnosis not present

## 2015-06-04 DIAGNOSIS — M1990 Unspecified osteoarthritis, unspecified site: Secondary | ICD-10-CM | POA: Insufficient documentation

## 2015-06-04 DIAGNOSIS — Y9289 Other specified places as the place of occurrence of the external cause: Secondary | ICD-10-CM | POA: Diagnosis not present

## 2015-06-04 DIAGNOSIS — K219 Gastro-esophageal reflux disease without esophagitis: Secondary | ICD-10-CM | POA: Diagnosis not present

## 2015-06-04 DIAGNOSIS — I10 Essential (primary) hypertension: Secondary | ICD-10-CM | POA: Diagnosis not present

## 2015-06-04 DIAGNOSIS — W010XXA Fall on same level from slipping, tripping and stumbling without subsequent striking against object, initial encounter: Secondary | ICD-10-CM | POA: Insufficient documentation

## 2015-06-04 DIAGNOSIS — S4992XA Unspecified injury of left shoulder and upper arm, initial encounter: Secondary | ICD-10-CM | POA: Diagnosis not present

## 2015-06-04 DIAGNOSIS — F419 Anxiety disorder, unspecified: Secondary | ICD-10-CM | POA: Diagnosis not present

## 2015-06-04 DIAGNOSIS — F319 Bipolar disorder, unspecified: Secondary | ICD-10-CM | POA: Diagnosis not present

## 2015-06-04 DIAGNOSIS — Z79899 Other long term (current) drug therapy: Secondary | ICD-10-CM | POA: Diagnosis not present

## 2015-06-04 DIAGNOSIS — M25512 Pain in left shoulder: Secondary | ICD-10-CM

## 2015-06-04 DIAGNOSIS — G8929 Other chronic pain: Secondary | ICD-10-CM | POA: Insufficient documentation

## 2015-06-04 DIAGNOSIS — R350 Frequency of micturition: Secondary | ICD-10-CM | POA: Diagnosis not present

## 2015-06-04 DIAGNOSIS — E669 Obesity, unspecified: Secondary | ICD-10-CM | POA: Diagnosis not present

## 2015-06-04 DIAGNOSIS — S59901A Unspecified injury of right elbow, initial encounter: Secondary | ICD-10-CM | POA: Insufficient documentation

## 2015-06-04 DIAGNOSIS — M25521 Pain in right elbow: Secondary | ICD-10-CM

## 2015-06-04 LAB — URINE MICROSCOPIC-ADD ON

## 2015-06-04 LAB — URINALYSIS, ROUTINE W REFLEX MICROSCOPIC
BILIRUBIN URINE: NEGATIVE
GLUCOSE, UA: NEGATIVE mg/dL
KETONES UR: NEGATIVE mg/dL
NITRITE: NEGATIVE
PH: 5.5 (ref 5.0–8.0)
Protein, ur: NEGATIVE mg/dL
SPECIFIC GRAVITY, URINE: 1.026 (ref 1.005–1.030)
Urobilinogen, UA: 1 mg/dL (ref 0.0–1.0)

## 2015-06-04 MED ORDER — CEPHALEXIN 500 MG PO CAPS: 500.0000 mg | ORAL_CAPSULE | Freq: Four times a day (QID) | ORAL | Status: DC

## 2015-06-04 NOTE — ED Notes (Addendum)
Pt tripped on Sunday landing on right elbow.  Pt states bruising and pain.  Pt also states that pain clinic wants her left shoulder xray done. She wants to get it done while here.  Was supposed to get in to see Dr Ladona Horns but cannot get in to see him.  The shoulder is chronic and not new.  Pt also wants seen for urinary frequency

## 2015-06-04 NOTE — ED Provider Notes (Signed)
CSN: [id number]     Arrival date & time 06/04/15  1536 History   First MD Initiated Contact with Patient 06/04/15 1748     Chief Complaint  Patient presents with  . Elbow Pain     (Consider location/radiation/quality/duration/timing/severity/associated sxs/prior Treatment) HPI Comments: Patient presents to the emergency department with chief complaints of left shoulder pain and right elbow pain.  Patient states that on Sunday she tripped and fell and landed on her right elbow. She complains of mild bruising and pain. She states that she follows at a pain clinic, who instructed her to have her elbow x-ray. She also asked for an x-ray of her left shoulder. Patient states that the pain in her left shoulder is chronic. Additionally, patient states that she has had some urinary frequency. She states this is new for her. She denies any dysuria or hematuria, but does have a history of UTI. She denies any fevers or chills.  The history is provided by the patient. No language interpreter was used.    Past Medical History  Diagnosis Date  . Fibromyalgia   . Bipolar 1 disorder   . Spinal stenosis   . Osteoarthritis   . Hypertension   . Obesity   . GERD (gastroesophageal reflux disease)   . PUD (peptic ulcer disease)   . Anxiety    Past Surgical History  Procedure Laterality Date  . Abdominal hysterectomy    . Arthroscopic knee    . Cardiovascular stress test  03/2010    Lexiscan Myoview: EF 55%, mild breast attenuation, no ischemia or scar  . Transthoracic echocardiogram  03/2010    EF 50-55%, normal LV size, no WMAs   Family History  Problem Relation Age of Onset  . Diabetes Other   . Hypertension Other   . Schizophrenia Mother   . Hypertension Mother   . Stroke Father    Social History  Substance Use Topics  . Smoking status: Current Every Day Smoker -- 0.50 packs/day    Types: Cigarettes  . Smokeless tobacco: Never Used  . Alcohol Use: No   OB History    No data available      Review of Systems  Constitutional: Negative for fever and chills.  Respiratory: Negative for shortness of breath.   Cardiovascular: Negative for chest pain.  Gastrointestinal: Negative for nausea, vomiting, diarrhea and constipation.  Genitourinary: Positive for frequency. Negative for dysuria.  Musculoskeletal: Positive for arthralgias.  All other systems reviewed and are negative.     Allergies  Abilify; Latuda; and Naproxen  Home Medications   Prior to Admission medications   Medication Sig Start Date End Date Taking? Authorizing Provider  ALPRAZolam Duanne Moron) 1 MG tablet Take 1 mg by mouth 3 (three) times daily.  10/30/14  Yes Historical Provider, MD  amitriptyline (ELAVIL) 50 MG tablet One tablet at bedtime 04/18/15  Yes Bayard Hugger, NP  aspirin 81 MG chewable tablet Chew 81 mg by mouth at bedtime.    Yes Historical Provider, MD  baclofen (LIORESAL) 10 MG tablet Take 1 tablet (10 mg total) by mouth 3 (three) times daily. Patient taking differently: Take 10 mg by mouth 3 (three) times daily as needed for muscle spasms.  05/28/15  Yes Bayard Hugger, NP  divalproex (DEPAKOTE ER) 500 MG 24 hr tablet Take 1,500 mg by mouth every evening.     Yes Historical Provider, MD  DULoxetine (CYMBALTA) 60 MG capsule Take 1 capsule (60 mg total) by mouth daily. 05/28/15  Yes  Bayard Hugger, NP  morphine (MS CONTIN) 15 MG 12 hr tablet Take 1 tablet (15 mg total) by mouth every 12 (twelve) hours. 05/28/15  Yes Bayard Hugger, NP  oxyCODONE (ROXICODONE) 15 MG immediate release tablet Take 1 tablet (15 mg total) by mouth every 8 (eight) hours as needed for pain. 05/28/15  Yes Bayard Hugger, NP  bisacodyl (BISACODYL) 5 MG EC tablet Take 1 tablet (5 mg total) by mouth daily as needed for moderate constipation. Patient not taking: Reported on 06/04/2015 05/28/15   Bayard Hugger, NP   BP 115/75 mmHg  Pulse 111  Temp(Src) 98.3 F (36.8 C) (Oral)  Resp 18  SpO2 97% Physical Exam   Constitutional: She is oriented to person, place, and time. She appears well-developed and well-nourished.  HENT:  Head: Normocephalic and atraumatic.  Eyes: Conjunctivae and EOM are normal. Pupils are equal, round, and reactive to light.  Neck: Normal range of motion. Neck supple.  Cardiovascular: Normal rate, regular rhythm and intact distal pulses.  Exam reveals no gallop and no friction rub.   No murmur heard. Intact distal pulses with brisk capillary refill  Pulmonary/Chest: Effort normal and breath sounds normal. No respiratory distress. She has no wheezes. She has no rales. She exhibits no tenderness.  Abdominal: Soft. Bowel sounds are normal. She exhibits no distension and no mass. There is no tenderness. There is no rebound and no guarding.  Musculoskeletal: Normal range of motion. She exhibits no edema or tenderness.  Right elbow nontender to palpation, no bony abnormality or deformity, there is some pain with resisted supination  No bony abnormality or deformity of left shoulder  Neurological: She is alert and oriented to person, place, and time.  Distal sensation intact  Skin: Skin is warm and dry.  Psychiatric: She has a normal mood and affect. Her behavior is normal. Judgment and thought content normal.  Nursing note and vitals reviewed.   ED Course  Procedures (including critical care time) Labs Review Labs Reviewed  URINALYSIS, ROUTINE W REFLEX MICROSCOPIC (NOT AT [facility]) - Abnormal; Notable for the following:    APPearance CLOUDY (*)    Hgb urine dipstick SMALL (*)    Leukocytes, UA MODERATE (*)    All other components within normal limits  URINE MICROSCOPIC-ADD ON - Abnormal; Notable for the following:    Squamous Epithelial / LPF FEW (*)    Bacteria, UA FEW (*)    All other components within normal limits  URINE CULTURE    Imaging Review Dg Elbow Complete Right  06/04/2015   CLINICAL DATA:  Medial anterior right elbow pain, status post fall  EXAM: RIGHT ELBOW  - COMPLETE 3+ VIEW  COMPARISON:  None.  FINDINGS: There is no evidence of fracture, dislocation, or joint effusion. There are osteoarthritic changes with moderate to large osteophytes off of the radial head, coronoid process of the ulna and the lateral epicondyle. There is a well corticated osseous fragment anterior to the coronoid process, which may represent a loose body. Soft tissues are unremarkable.  IMPRESSION: No evidence of fracture or subluxation.  Osteoarthritic changes.   Electronically Signed   By: Fidela Salisbury M.D.   On: 06/04/2015 17:00   Dg Shoulder Left  06/04/2015   CLINICAL DATA:  [place] on Saturday in the grass at a funeral injuring RIGHT elbow, complaining of LEFT shoulder pain  EXAM: LEFT SHOULDER - 2+ VIEW  COMPARISON:  None; correlation MRI LEFT shoulder 05/18/2013  FINDINGS: AC joint alignment normal.  Osseous mineralization grossly normal.  Joint space narrowing LEFT glenohumeral joint with bulky spur formation at inferior margin of the humeral head.  No acute fracture, dislocation or bone destruction.  Visualized LEFT ribs intact.  IMPRESSION: LEFT glenohumeral degenerative changes.  No acute abnormalities.   Electronically Signed   By: Lavonia Dana M.D.   On: 06/04/2015 16:50   I have personally reviewed and evaluated these images and lab results as part of my medical decision-making.   EKG Interpretation None      MDM   Final diagnoses:  Right elbow pain  Chronic left shoulder pain  Urinary frequency    Patient with right elbow pain following a fall, recommend ice, rest, and NSAIDs. Patient also has left chronic shoulder pain. She is scheduled to see an orthopedic and is also followed by pain management for this. Additionally she also complains of urinary frequency. Urinalysis is remarkable for 11-20 white blood cells, moderate leukocytes, with few bacteria, given that this is a change for her, I will treat her with Keflex. Will send urine for culture. Patient  understands and agrees the plan. She is stable and ready for discharge.    Montine Circle, PA-C 06/04/15 1813  Courteney Julio Alm, MD 06/04/15 [phone]

## 2015-06-04 NOTE — ED Notes (Signed)
Pt reported rt elbow pain s/p to tripping and falling. Pt denies hitting head, LOC, dizziness, headache, and nausea. Rt arm with (+)PMS, CRT brisk, ROM, no deformity. Pt reported chronic pain to rt shoulder. Denies injury/trauma but requesting x-ray. Pt reported urinary frequency/urgency, pressure with voiding but denies dysuria, lower abd/back pain, foul odor and discoloration of urine.

## 2015-06-04 NOTE — Discharge Instructions (Signed)
Arthralgia °Your caregiver has diagnosed you as suffering from an arthralgia. Arthralgia means there is pain in a joint. This can come from many reasons including: °· Bruising the joint which causes soreness (inflammation) in the joint. °· Wear and tear on the joints which occur as we grow older (osteoarthritis). °· Overusing the joint. °· Various forms of arthritis. °· Infections of the joint. °Regardless of the cause of pain in your joint, most of these different pains respond to anti-inflammatory drugs and rest. The exception to this is when a joint is infected, and these cases are treated with antibiotics, if it is a bacterial infection. °HOME CARE INSTRUCTIONS  °· Rest the injured area for as long as directed by your caregiver. Then slowly start using the joint as directed by your caregiver and as the pain allows. Crutches as directed may be useful if the ankles, knees or hips are involved. If the knee was splinted or casted, continue use and care as directed. If an stretchy or elastic wrapping bandage has been applied today, it should be removed and re-applied every 3 to 4 hours. It should not be applied tightly, but firmly enough to keep swelling down. Watch toes and feet for swelling, bluish discoloration, coldness, numbness or excessive pain. If any of these problems (symptoms) occur, remove the ace bandage and re-apply more loosely. If these symptoms persist, contact your caregiver or return to this location. °· For the first 24 hours, keep the injured extremity elevated on pillows while lying down. °· Apply ice for 15-20 minutes to the sore joint every couple hours while awake for the first half day. Then 03-04 times per day for the first 48 hours. Put the ice in a plastic bag and place a towel between the bag of ice and your skin. °· Wear any splinting, casting, elastic bandage applications, or slings as instructed. °· Only take over-the-counter or prescription medicines for pain, discomfort, or fever as  directed by your caregiver. Do not use aspirin immediately after the injury unless instructed by your physician. Aspirin can cause increased bleeding and bruising of the tissues. °· If you were given crutches, continue to use them as instructed and do not resume weight bearing on the sore joint until instructed. °Persistent pain and inability to use the sore joint as directed for more than 2 to 3 days are warning signs indicating that you should see a caregiver for a follow-up visit as soon as possible. Initially, a hairline fracture (break in bone) may not be evident on X-rays. Persistent pain and swelling indicate that further evaluation, non-weight bearing or use of the joint (use of crutches or slings as instructed), or further X-rays are indicated. X-rays may sometimes not show a small fracture until a week or 10 days later. Make a follow-up appointment with your own caregiver or one to whom we have referred you. A radiologist (specialist in reading X-rays) may read your X-rays. Make sure you know how you are to obtain your X-ray results. Do not assume everything is normal if you do not hear from us. °SEEK MEDICAL CARE IF: °Bruising, swelling, or pain increases. °SEEK IMMEDIATE MEDICAL CARE IF:  °· Your fingers or toes are numb or blue. °· The pain is not responding to medications and continues to stay the same or get worse. °· The pain in your joint becomes severe. °· You develop a fever over 102° F (38.9° C). °· It becomes impossible to move or use the joint. °MAKE SURE YOU:  °·   Understand these instructions.  Will watch your condition.  Will get help right away if you are not doing well or get worse. Document Released: 09/29/2005 Document Revised: 12/22/2011 Document Reviewed: 05/17/2008 [facility] Patient Information 2015 [place], [place]. This information is not intended to replace advice given to you by your health care provider. Make sure you discuss any questions you have with your health care  provider. Urinary Frequency The number of times a normal person urinates depends upon how much liquid they take in and how much liquid they are losing. If the temperature is hot and there is high humidity, then the person will sweat more and usually breathe a little more frequently. These factors decrease the amount of frequency of urination that would be considered normal. The amount you drink is easily determined, but the amount of fluid lost is sometimes more difficult to calculate.  Fluid is lost in two ways:  Sensible fluid loss is usually measured by the amount of urine that you get rid of. Losses of fluid can also occur with diarrhea.  Insensible fluid loss is more difficult to measure. It is caused by evaporation. Insensible loss of fluid occurs through breathing and sweating. It usually ranges from a little less than a quart to a little more than a quart of fluid a day. In normal temperatures and activity levels, the average person may urinate 4 to 7 times in a 24-hour period. Needing to urinate more often than that could indicate a problem. If one urinates 4 to 7 times in 24 hours and has large volumes each time, that could indicate a different problem from one who urinates 4 to 7 times a day and has small volumes. The time of urinating is also important. Most urinating should be done during the waking hours. Getting up at night to urinate frequently can indicate some problems. CAUSES  The bladder is the organ in your lower abdomen that holds urine. Like a balloon, it swells some as it fills up. Your nerves sense this and tell you it is time to head for the bathroom. There are a number of reasons that you might feel the need to urinate more often than usual. They include:  Urinary tract infection. This is usually associated with other signs such as burning when you urinate.  In men, problems with the prostate (a walnut-size gland that is located near the tube that carries urine out of your  body). There are two reasons why the prostate can cause an increased frequency of urination:  An enlarged prostate that does not let the bladder empty well. If the bladder only half empties when you urinate, then it only has half the capacity to fill before you have to urinate again.  The nerves in the bladder become more hypersensitive with an increased size of the prostate even if the bladder empties completely.  Pregnancy.  Obesity. Excess weight is more likely to cause a problem for women than for men.  Bladder stones or other bladder problems.  Caffeine.  Alcohol.  Medications. For example, drugs that help the body get rid of extra fluid (diuretics) increase urine production. Some other medicines must be taken with lots of fluids.  Muscle or nerve weakness. This might be the result of a spinal cord injury, a stroke, multiple sclerosis, or Parkinson disease.  Long-standing diabetes can decrease the sensation of the bladder. This loss of sensation makes it harder to sense the bladder needs to be emptied. Over a period of years, the  bladder is stretched out by constant overfilling. This weakens the bladder muscles so that the bladder does not empty well and has less capacity to fill with new urine.  Interstitial cystitis (also called painful bladder syndrome). This condition develops because the tissues that line the inside of the bladder are inflamed (inflammation is the body's way of reacting to injury or infection). It causes pain and frequent urination. It occurs in women more often than in men. DIAGNOSIS   To decide what might be causing your urinary frequency, your health care provider will probably:  Ask about symptoms you have noticed.  Ask about your overall health. This will include questions about any medications you are taking.  Do a physical examination.  Order some tests. These might include:  A blood test to check for diabetes or other health issues that could be  contributing to the problem.  Urine testing. This could measure the flow of urine and the pressure on the bladder.  A test of your neurological system (the brain, spinal cord, and nerves). This is the system that senses the need to urinate.  A bladder test to check whether it is emptying completely when you urinate.  Cystoscopy. This test uses a thin tube with a tiny camera on it. It offers a look inside your urethra and bladder to see if there are problems.  Imaging tests. You might be given a contrast dye and then asked to urinate. X-rays are taken to see how your bladder is working. TREATMENT  It is important for you to be evaluated to determine if the amount or frequency that you have is unusual or abnormal. If it is found to be abnormal, the cause should be determined and this can usually be found out easily. Depending upon the cause, treatment could include medication, stimulation of the nerves, or surgery. There are not too many things that you can do as an individual to change your urinary frequency. It is important that you balance the amount of fluid intake needed to compensate for your activity and the temperature. Medical problems will be diagnosed and taken care of by your physician. There is no particular bladder training such as Kegel exercises that you can do to help urinary frequency. This is an exercise that is usually recommended for people who have leaking of urine when they laugh, cough, or sneeze. HOME CARE INSTRUCTIONS   Take any medications your health care provider prescribed or suggested. Follow the directions carefully.  Practice any lifestyle changes that are recommended. These might include:  Drinking less fluid or drinking at different times of the day. If you need to urinate often during the night, for example, you may need to stop drinking fluids early in the evening.  Cutting down on caffeine or alcohol. They both can make you need to urinate more often than  normal. Caffeine is found in coffee, tea, and sodas.  Losing weight, if that is recommended.  Keep a journal or a log. You might be asked to record how much you drink and when and where you feel the need to urinate. This will also help evaluate how well the treatment provided by your physician is working. SEEK MEDICAL CARE IF:   Your need to urinate often gets worse.  You feel increased pain or irritation when you urinate.  You notice blood in your urine.  You have questions about any medications that your health care provider recommended.  You notice blood, pus, or swelling at the site of  any test or treatment procedure.  You develop a fever of more than 100.4F (38.1C). SEEK IMMEDIATE MEDICAL CARE IF:  You develop a fever of more than 102.50F (38.9C). Document Released: 07/26/2009 Document Revised: 02/13/2014 Document Reviewed: 07/26/2009 [facility] Patient Information 2015 [place], [place]. This information is not intended to replace advice given to you by your health care provider. Make sure you discuss any questions you have with your health care provider.

## 2015-06-06 LAB — URINE CULTURE

## 2015-06-21 ENCOUNTER — Ambulatory Visit: Payer: Medicare HMO | Admitting: Neurology

## 2015-06-25 ENCOUNTER — Encounter: Payer: Medicare HMO | Attending: Physical Medicine and Rehabilitation | Admitting: Registered Nurse

## 2015-06-25 ENCOUNTER — Encounter: Payer: Self-pay | Admitting: Registered Nurse

## 2015-06-25 ENCOUNTER — Other Ambulatory Visit: Payer: Self-pay | Admitting: Registered Nurse

## 2015-06-25 VITALS — BP 119/82 | HR 91

## 2015-06-25 DIAGNOSIS — M12512 Traumatic arthropathy, left shoulder: Secondary | ICD-10-CM | POA: Insufficient documentation

## 2015-06-25 DIAGNOSIS — G894 Chronic pain syndrome: Secondary | ICD-10-CM

## 2015-06-25 DIAGNOSIS — M19012 Primary osteoarthritis, left shoulder: Secondary | ICD-10-CM | POA: Diagnosis present

## 2015-06-25 DIAGNOSIS — IMO0001 Reserved for inherently not codable concepts without codable children: Secondary | ICD-10-CM

## 2015-06-25 DIAGNOSIS — M791 Myalgia: Secondary | ICD-10-CM

## 2015-06-25 DIAGNOSIS — M609 Myositis, unspecified: Secondary | ICD-10-CM | POA: Diagnosis present

## 2015-06-25 DIAGNOSIS — M19019 Primary osteoarthritis, unspecified shoulder: Secondary | ICD-10-CM | POA: Insufficient documentation

## 2015-06-25 DIAGNOSIS — Z79899 Other long term (current) drug therapy: Secondary | ICD-10-CM

## 2015-06-25 DIAGNOSIS — Z5181 Encounter for therapeutic drug level monitoring: Secondary | ICD-10-CM | POA: Diagnosis present

## 2015-06-25 DIAGNOSIS — F319 Bipolar disorder, unspecified: Secondary | ICD-10-CM

## 2015-06-25 DIAGNOSIS — M129 Arthropathy, unspecified: Secondary | ICD-10-CM | POA: Diagnosis present

## 2015-06-25 DIAGNOSIS — M47817 Spondylosis without myelopathy or radiculopathy, lumbosacral region: Secondary | ICD-10-CM | POA: Diagnosis present

## 2015-06-25 MED ORDER — OXYCODONE HCL 15 MG PO TABS: 15.0000 mg | ORAL_TABLET | Freq: Three times a day (TID) | ORAL | Status: DC | PRN

## 2015-06-25 MED ORDER — MORPHINE SULFATE ER 15 MG PO TBCR: 15.0000 mg | EXTENDED_RELEASE_TABLET | Freq: Two times a day (BID) | ORAL | Status: DC

## 2015-06-25 NOTE — Progress Notes (Signed)
Subjective:    Patient ID: [name], female    DOB: 01-May-1966, 49 y.o.   MRN: [id number]  HPI: [name] is a 49year old female who returns for follow up for chronic pain and medication refill. She says her pain is located in her left shoulder and lower back.Also states increase intensity of left shoulder pain.She was educated on the Narcotic Policy, we will increase Oxycodone to 90 tablets this month only. Instructed to follow up with her Orthopedist. Educated on medication compliance and of her analgesics should be taken as prescribed only. If she is short on her medication again this will be discuss with [name] and can lead to discharge from office. She verbalizes understanding. She rates her pain 7. Her current exercise regime is walking. Also states on Friday 06/22/15 she was sitting on the commode when her shower curtain rod hit her on the bridge of her nose. She didn't seek medical attention. Small abrasion at the bridge of nose.she verbalizes understanding.  Was at [facility] ED on 06/04/15 for right elbow pain, left shoulder and UTI. She called the office she has an appointment with Percell Walter and [place] at 4:00 today.  Pain Inventory Average Pain 6 Pain Right Now 7 My pain is burning, stabbing and tingling  In the last 24 hours, has pain interfered with the following? General activity 6 Relation with others 7 Enjoyment of life 7 What TIME of day is your pain at its worst? evening Sleep (in general) NA  Pain is worse with: walking, bending, sitting and standing Pain improves with: rest, heat/ice and medication Relief from Meds: 5  Mobility ability to climb steps?  yes do you drive?  yes Do you have any goals in this area?  no  Function disabled: date disabled na Do you have any goals in this area?  no  Neuro/Psych No problems in this area  Prior Studies Any changes since last visit?  no x-rays  Physicians involved in your care Any changes  since last visit?  no Primary care na   Family History  Problem Relation Age of Onset  . Diabetes Other   . Hypertension Other   . Schizophrenia Mother   . Hypertension Mother   . Stroke Father    Social History   Social History  . Marital Status: Divorced    Spouse Name: N/A  . Number of Children: N/A  . Years of Education: N/A   Social History Main Topics  . Smoking status: Current Every Day Smoker -- 0.50 packs/day    Types: Cigarettes  . Smokeless tobacco: Never Used  . Alcohol Use: No  . Drug Use: No  . Sexual Activity: Not Asked   Other Topics Concern  . None   Social History Narrative   Past Surgical History  Procedure Laterality Date  . Abdominal hysterectomy    . Arthroscopic knee    . Cardiovascular stress test  03/2010    Lexiscan Myoview: EF 55%, mild breast attenuation, no ischemia or scar  . Transthoracic echocardiogram  03/2010    EF 50-55%, normal LV size, no WMAs   Past Medical History  Diagnosis Date  . Fibromyalgia   . Bipolar 1 disorder   . Spinal stenosis   . Osteoarthritis   . Hypertension   . Obesity   . GERD (gastroesophageal reflux disease)   . PUD (peptic ulcer disease)   . Anxiety    BP 119/82 mmHg  Pulse 91  SpO2 94%  Opioid Risk Score:   Fall Risk Score:  `1  Depression screen PHQ 2/9  Depression screen [facility] 2/9 06/25/2015 05/28/2015 01/02/2015  Decreased Interest 0 1 1  Down, Depressed, Hopeless 0 1 1  PHQ - 2 Score 0 2 2  Altered sleeping - - 2  Tired, decreased energy - - 1  Change in appetite - - 1  Feeling bad or failure about yourself  - - 1  Trouble concentrating - - 0  Moving slowly or fidgety/restless - - 0  Suicidal thoughts - - 0  PHQ-9 Score - - 7     Review of Systems  All other systems reviewed and are negative.      Objective:   Physical Exam  Constitutional: She is oriented to person, place, and time. She appears well-developed and well-nourished.  HENT:  Head: Normocephalic and atraumatic.   Neck: Normal range of motion. Neck supple.  Cardiovascular: Normal rate and regular rhythm.   Pulmonary/Chest: Effort normal and breath sounds normal.  Musculoskeletal:  Normal Muscle Bulk and Muscle Testing Reveals: Upper Extremities: Right: Full ROM and Muscle Strength 5/5 Left: Decreased ROM 45 Degrees and Muscle Strength 4/5 Lumbar Paraspinal Tenderness: L-3-L-4 Lower Extremities: Full ROM and Muscle Strength 5/5 Arises from chair slowly Antalgic Gait  Neurological: She is alert and oriented to person, place, and time.  Skin: Skin is warm and dry.  Psychiatric: She has a normal mood and affect.  Nursing note and vitals reviewed.         Assessment & Plan:  1. Fibromyalgia Syndrome: Continue with exercise and heat therapy. Encouraged to increase activity 2. Osteoarthritis of Bilateral Knees:  Orthopedist:  F/U with [place] Anna 06/25/15 at 4:00 Continue with heat and exercise therapy.  3. Left Shoulder OA:  F/U with Orthopedist:. Continue with Exercise and heat therapy and Medication regime.  4. Bipolar Syndrome: On Depakote and Cymbalta.  5. Lumbar Spondylosis: Refilled: Oxycodone 15mg  one tablet every 8 hours as needed. Increase this month only #90 and MS Contin 15 mg one tablet every 12 hours #60. 6. Insomnia: Continue Elavil 50 mg HS 7. Muscle Spasms: Continue Bacolen TID as needed  8. Constipation: Continue Bisacodyl  30 minutes of face to face patient care time was spent during this visit. All questions were encouraged and answered

## 2015-06-26 LAB — PMP ALCOHOL METABOLITE (ETG): ETGU: NEGATIVE ng/mL

## 2015-06-28 ENCOUNTER — Telehealth: Payer: Self-pay | Admitting: *Deleted

## 2015-06-28 NOTE — Telephone Encounter (Signed)
FYI...Marland KitchenMarland KitchenMarland KitchenMiss Cogburn was able to pick up her oxycontin 06/27/2015 and will be able to fill morphine on Sunday the 18th.  That is all she called for was to report

## 2015-06-30 LAB — PRESCRIPTION MONITORING PROFILE (SOLSTAS)
AMPHETAMINE/METH: NEGATIVE ng/mL
BARBITURATE SCREEN, URINE: NEGATIVE ng/mL
BUPRENORPHINE, URINE: NEGATIVE ng/mL
Cannabinoid Scrn, Ur: NEGATIVE ng/mL
Carisoprodol, Urine: NEGATIVE ng/mL
Cocaine Metabolites: NEGATIVE ng/mL
Creatinine, Urine: 145.24 mg/dL (ref >20.0)
FENTANYL URINE: NEGATIVE ng/mL
MDMA URINE: NEGATIVE ng/mL
MEPERIDINE UR: NEGATIVE ng/mL
METHADONE SCREEN, URINE: NEGATIVE ng/mL
NITRITES URINE, INITIAL: NEGATIVE ug/mL
PROPOXYPHENE: NEGATIVE ng/mL
TAPENTADOLUR: NEGATIVE ng/mL
Tramadol Scrn, Ur: NEGATIVE ng/mL
ZOLPIDEM, URINE: NEGATIVE ng/mL
pH, Initial: 5.3 pH (ref 4.5–8.9)

## 2015-06-30 LAB — BENZODIAZEPINES (GC/LC/MS), URINE
Alprazolam metabolite (GC/LC/MS), ur confirm: 246 ng/mL (ref <25)
Clonazepam metabolite (GC/LC/MS), ur confirm: NEGATIVE ng/mL (ref <25)
FLURAZEPAMU: NEGATIVE ng/mL (ref <50)
LORAZEPAMU: NEGATIVE ng/mL (ref <50)
MIDAZOLAMU: NEGATIVE ng/mL (ref <50)
NORDIAZEPAMU: NEGATIVE ng/mL (ref <50)
Oxazepam (GC/LC/MS), ur confirm: NEGATIVE ng/mL (ref <50)
TEMAZEPAMU: NEGATIVE ng/mL (ref <50)
Triazolam metabolite (GC/LC/MS), ur confirm: NEGATIVE ng/mL (ref <50)

## 2015-06-30 LAB — OPIATES/OPIOIDS (LC/MS-MS)
Codeine Urine: NEGATIVE ng/mL (ref <50)
Hydrocodone: NEGATIVE ng/mL (ref <50)
Hydromorphone: NEGATIVE ng/mL — AB (ref <50)
MORPHINE: 9782 ng/mL (ref <50)
NORHYDROCODONE, UR: NEGATIVE ng/mL (ref <50)
Noroxycodone, Ur: 2128 ng/mL (ref <50)
OXYCODONE, UR: 1595 ng/mL (ref <50)
OXYMORPHONE, URINE: 317 ng/mL (ref <50)

## 2015-06-30 LAB — OXYCODONE, URINE (LC/MS-MS)
NOROXYCODONE, UR: 2128 ng/mL (ref <50)
OXYMORPHONE, URINE: 317 ng/mL (ref <50)
Oxycodone, ur: 1595 ng/mL (ref <50)

## 2015-07-02 ENCOUNTER — Other Ambulatory Visit: Payer: Self-pay | Admitting: Orthopedic Surgery

## 2015-07-02 DIAGNOSIS — Z01818 Encounter for other preprocedural examination: Secondary | ICD-10-CM

## 2015-07-03 ENCOUNTER — Other Ambulatory Visit: Payer: Medicare HMO

## 2015-07-03 ENCOUNTER — Encounter (HOSPITAL_BASED_OUTPATIENT_CLINIC_OR_DEPARTMENT_OTHER): Payer: Self-pay | Admitting: *Deleted

## 2015-07-03 NOTE — Progress Notes (Signed)
Mamie Levers at Dr Luanna Cole office notified of patient BMI 53 and cannot be done at [facility] per Dr Al Corpus.

## 2015-07-06 ENCOUNTER — Other Ambulatory Visit: Payer: Self-pay | Admitting: Orthopedic Surgery

## 2015-07-06 ENCOUNTER — Ambulatory Visit
Admission: RE | Admit: 2015-07-06 | Discharge: 2015-07-06 | Disposition: A | Discharge status: Home / Self Care | Payer: Medicare HMO | Source: Ambulatory Visit | Attending: Orthopedic Surgery | Admitting: Orthopedic Surgery

## 2015-07-06 DIAGNOSIS — Z01818 Encounter for other preprocedural examination: Secondary | ICD-10-CM

## 2015-07-11 ENCOUNTER — Inpatient Hospital Stay (HOSPITAL_COMMUNITY)
Admission: RE | Admit: 2015-07-11 | Discharge: 2015-07-11 | Disposition: A | Discharge status: Home / Self Care | Payer: Medicare HMO | Source: Ambulatory Visit

## 2015-07-11 ENCOUNTER — Encounter (HOSPITAL_COMMUNITY): Payer: Self-pay

## 2015-07-11 NOTE — Progress Notes (Addendum)
Anesthesia Chart Review:  Pt is 49 year old female scheduled for L total shoulder arthroplasty on 07/17/2015 with Dr. Mardelle Matte.   PMH includes: HTN, bipolar disorder, COPD, PUD, GERD. Current smoker. BMI 53.   Medications include: ASA, xanax, amitriptyline, depakote, cymbalta, hctz, morphine, oxycodone.   Pt was scheduled for PAT today and did not show up. RN conducted history, reviewed case over the phone with pt. Pt to be scheduled prior to surgery for lab/EKG only PAT appointment.   Echo 12/27/2014:  -Left ventricle: The cavity size was normal. Systolic function was normal. The estimated ejection fraction was in the range of 55% to 65%. Wall motion was normal; there were no regional wall motion abnormalities.  Nuclear stress test 04/09/2010:  -Exercise Capacity: [place] study with no exercise. -BP Response: Normal blood pressure response. -Clinical Symptoms: There is chest pain -ECG Impression: No significant ST segment change suggestive of ischemia. -Overall Impression: There is mild breast attenuation but no sign of scar or ischemia.  If labs and EKG acceptable, I anticipate pt can proceed as scheduled.   Willeen Cass, FNP-BC [facility] Short Stay Surgical Center/Anesthesiology Phone: [phone] 07/11/2015 4:26 PM  Addendum: Patient came in for labs and EKG today.   07/13/15 EKG: NSR. Anterior T wave abnormality appears stable since at least 09/17/11.   BMET WNL. CBC WNL except mildly decreased HGB of 11.7.   If no acute changes then I would anticipate that she could proceed as planned.  George Hugh [facility] Short Stay Center/Anesthesiology Phone [phone] 07/13/2015 3:38 PM

## 2015-07-11 NOTE — Pre-Procedure Instructions (Signed)
[name]  07/11/2015      [facility] - [place], [place] [address] Phone: [phone] Fax: [phone]  [id number] - [place], [place] AT [address] [place] [address] Phone: [phone] Fax: [phone]  [place], [place] [address] Phone: [phone] Fax: [phone]    Your procedure is scheduled on 07/17/15.  Report to [facility] cone short stay admitting at 530 A.M.  Call this number if you have problems the morning of surgery:  [phone]   Remember:  Do not eat food or drink liquids after midnight.  Take these medicines the morning of surgery with A SIP OF WATER xanax,cymbalta,pain med if needed,nitro if needed  STOP all herbel meds, nsaids (aleve,naproxen,advil,ibuprofen) 5 days prior to surgery 07/12/15 including aspirin, vitamins   Do not wear jewelry, make-up or nail polish.  Do not wear lotions, powders, or perfumes.  You may wear deodorant.  Do not shave 48 hours prior to surgery.  Men may shave face and neck.  Do not bring valuables to the hospital.  [facility] is not responsible for any belongings or valuables.  Contacts, dentures or bridgework may not be worn into surgery.  Leave your suitcase in the car.  After surgery it may be brought to your room.  For patients admitted to the hospital, discharge time will be determined by your treatment team.  Patients discharged the day of surgery will not be allowed to drive home.   Name and phone number of your driver:    Special instructions:   Special Instructions: [place] - Preparing for Surgery  Before surgery, you can play an important role.  Because skin is not sterile, your skin needs to be as free of germs as possible.  You can reduce the number of germs on  you skin by washing with CHG (chlorahexidine gluconate) soap before surgery.  CHG is an antiseptic cleaner which kills germs and bonds with the skin to continue killing germs even after washing.  Please DO NOT use if you have an allergy to CHG or antibacterial soaps.  If your skin becomes reddened/irritated stop using the CHG and inform your nurse when you arrive at Short Stay.  Do not shave (including legs and underarms) for at least 48 hours prior to the first CHG shower.  You may shave your face.  Please follow these instructions carefully:   1.  Shower with CHG Soap the night before surgery and the morning of Surgery.  2.  If you choose to wash your hair, wash your hair first as usual with your normal shampoo.  3.  After you shampoo, rinse your hair and body thoroughly to remove the Shampoo.  4.  Use CHG as you would any other liquid soap.  You can apply chg directly  to the skin and wash gently with scrungie or a clean washcloth.  5.  Apply the CHG Soap to your body ONLY FROM THE NECK DOWN.  Do not use on open wounds or open sores.  Avoid contact with your eyes ears, mouth and genitals (private parts).  Wash genitals (private parts)       with your normal soap.  6.  Wash thoroughly, paying special attention to the area where your surgery will be performed.  7.  Thoroughly rinse your body with warm water from the neck down.  8.  DO NOT shower/wash with your normal soap after using and rinsing off the CHG Soap.  9.  Pat yourself dry with a clean towel.            10.  Wear clean pajamas.            11.  Place clean sheets on your bed the night of your first shower and do not sleep with pets.  Day of Surgery  Do not apply any lotions/deodorants the morning of surgery.  Please wear clean clothes to the hospital/surgery center.  Please read over the following fact sheets that you were given. Pain Booklet, Coughing and Deep Breathing, MRSA Information and Surgical Site Infection  Prevention

## 2015-07-11 NOTE — Progress Notes (Signed)
Patient forgot appt. Hx done over phone. Baker Janus to call and set up appt for labs to be done.

## 2015-07-12 NOTE — Progress Notes (Signed)
Urine drug screen for this encounter is consistent for prescribed medication 

## 2015-07-13 ENCOUNTER — Encounter (HOSPITAL_COMMUNITY)
Admission: RE | Admit: 2015-07-13 | Discharge: 2015-07-13 | Disposition: A | Discharge status: Home / Self Care | Payer: Medicare HMO | Source: Ambulatory Visit | Attending: Orthopedic Surgery | Admitting: Orthopedic Surgery

## 2015-07-13 ENCOUNTER — Other Ambulatory Visit (HOSPITAL_COMMUNITY): Payer: Self-pay | Admitting: *Deleted

## 2015-07-13 DIAGNOSIS — M19012 Primary osteoarthritis, left shoulder: Secondary | ICD-10-CM | POA: Diagnosis not present

## 2015-07-13 DIAGNOSIS — Z01818 Encounter for other preprocedural examination: Secondary | ICD-10-CM | POA: Insufficient documentation

## 2015-07-13 LAB — CBC
HCT: 36.4 % (ref 36.0–46.0)
HEMOGLOBIN: 11.7 g/dL — AB (ref 12.0–15.0)
MCH: 27.1 pg (ref 26.0–34.0)
MCHC: 32.1 g/dL (ref 30.0–36.0)
MCV: 84.5 fL (ref 78.0–100.0)
Platelets: 281 10*3/uL (ref 150–400)
RBC: 4.31 MIL/uL (ref 3.87–5.11)
RDW: 14.2 % (ref 11.5–15.5)
WBC: 7.3 10*3/uL (ref 4.0–10.5)

## 2015-07-13 LAB — BASIC METABOLIC PANEL
ANION GAP: 9 (ref 5–15)
BUN: 15 mg/dL (ref 6–20)
CALCIUM: 9.7 mg/dL (ref 8.9–10.3)
CO2: 27 mmol/L (ref 22–32)
Chloride: 103 mmol/L (ref 101–111)
Creatinine, Ser: 0.82 mg/dL (ref 0.44–1.00)
Glucose, Bld: 96 mg/dL (ref 65–99)
Potassium: 4.1 mmol/L (ref 3.5–5.1)
SODIUM: 139 mmol/L (ref 135–145)

## 2015-07-13 LAB — SURGICAL PCR SCREEN
MRSA, PCR: NEGATIVE
STAPHYLOCOCCUS AUREUS: NEGATIVE

## 2015-07-13 NOTE — Pre-Procedure Instructions (Signed)
[name]  07/13/2015      [facility],  [place] [address] Phone: [phone] Fax: [phone]  [id number] - [place], [place] [place] [address] Phone: [phone] Fax: [phone]  [place], [place] [address] Phone: [phone] Fax: [phone]    Your procedure is scheduled on Tuesday, July 17, 2015 at 7:30 AM.   Report to [facility] Entrance "A" Admitting Office at 5:30 AM.   Call this number if you have problems the morning of surgery:  [phone]    Remember:  Do not eat food or drink liquids after midnight Monday, 07/16/15.  Take these medicines the morning of surgery with A SIP OF WATER:  Xanax, Cymbalta, pain med if needed, nitro if needed  STOP all herbal meds, NSAIDS (Aleve, Naproxen, Ibuprofen) 5 days prior to surgery 07/12/15 including Aspirin and Vitamins   Do not wear jewelry, make-up or nail polish.  Do not wear lotions, powders, or perfumes.  You may wear deodorant.  Do not shave 48 hours prior to surgery.    Do not bring valuables to the hospital.  [facility] is not responsible for any belongings or valuables.  Contacts, dentures or bridgework may not be worn into surgery.  Leave your suitcase in the car.  After surgery it may be brought to your room.  For patients admitted to the hospital, discharge time will be determined by your treatment team.  Patients discharged the day of surgery will not be allowed to drive home.   Name and phone number of your driver:    Special instructions:   Special Instructions: [place] - Preparing for Surgery  Before surgery, you can play an important role.  Because skin is not sterile, your skin needs to be as free of germs as  possible.  You can reduce the number of germs on you skin by washing with CHG (chlorahexidine gluconate) soap before surgery.  CHG is an antiseptic cleaner which kills germs and bonds with the skin to continue killing germs even after washing.  Please DO NOT use if you have an allergy to CHG or antibacterial soaps.  If your skin becomes reddened/irritated stop using the CHG and inform your nurse when you arrive at Short Stay.  Do not shave (including legs and underarms) for at least 48 hours prior to the first CHG shower.  You may shave your face.  Please follow these instructions carefully:   1.  Shower with CHG Soap the night before surgery and the morning of Surgery.  2.  If you choose to wash your hair, wash your hair first as usual with your normal shampoo.  3.  After you shampoo, rinse your hair and body thoroughly to remove the Shampoo.  4.  Use CHG as you would any other liquid soap.  You can apply chg directly  to the skin and wash gently with scrungie or a clean washcloth.  5.  Apply the CHG Soap to your body ONLY FROM THE NECK DOWN.  Do not use on open wounds or open sores.  Avoid contact with your eyes ears, mouth and genitals (private parts).  Wash genitals (private parts)       with your normal soap.  6.  Wash thoroughly, paying special attention to the area where your surgery will be performed.  7.  Thoroughly rinse your body with warm water from the neck down.  8.  DO NOT shower/wash with your normal soap after using and rinsing off the CHG Soap.  9.  Pat yourself dry with a clean towel.            10.  Wear clean pajamas.            11.  Place clean sheets on your bed the night of your first shower and do not sleep with pets.  Day of Surgery  Do not apply any lotions/deodorants the morning of surgery.  Please wear clean clothes to the hospital/surgery center.  Please read over the following fact sheets that you were given. Pain Booklet, Coughing and Deep Breathing, MRSA  Information and Surgical Site Infection Prevention

## 2015-07-13 NOTE — Progress Notes (Signed)
Stop Bang Assessment tool sent to Dr. Tammi Klippel

## 2015-07-13 NOTE — Progress Notes (Signed)
   07/13/15 1011  OBSTRUCTIVE SLEEP APNEA  Have you ever been diagnosed with sleep apnea through a sleep study? No  Do you snore loudly (loud enough to be heard through closed doors)?  1  Do you often feel tired, fatigued, or sleepy during the daytime (such as falling asleep during driving or talking to someone)? 1  Has anyone observed you stop breathing during your sleep? 0  Do you have, or are you being treated for high blood pressure? 1  BMI more than 35 kg/m2? 1  Age > 50 (1-yes) 0  Neck circumference greater than:Female 16 inches or larger, Female 17inches or larger? 1  Female Gender (Yes=1) 0  Obstructive Sleep Apnea Score 5

## 2015-07-16 MED ORDER — DEXTROSE 5 % IV SOLN
3.0000 g | INTRAVENOUS | Status: AC
  Administered 2015-07-17: 3 g via INTRAVENOUS
  Filled 2015-07-16: qty 3000

## 2015-07-17 ENCOUNTER — Encounter (HOSPITAL_COMMUNITY): Payer: Self-pay | Admitting: Surgery

## 2015-07-17 ENCOUNTER — Ambulatory Visit (HOSPITAL_COMMUNITY): Payer: Medicare HMO | Admitting: Vascular Surgery

## 2015-07-17 ENCOUNTER — Inpatient Hospital Stay (HOSPITAL_COMMUNITY)
Admission: RE | Admit: 2015-07-17 | Discharge: 2015-07-21 | DRG: 483 | Disposition: A | Discharge status: Home / Self Care | Payer: Medicare HMO | Source: Ambulatory Visit | Attending: Internal Medicine | Admitting: Internal Medicine

## 2015-07-17 ENCOUNTER — Ambulatory Visit (HOSPITAL_COMMUNITY): Payer: Medicare HMO | Admitting: Anesthesiology

## 2015-07-17 ENCOUNTER — Observation Stay (HOSPITAL_COMMUNITY): Payer: Medicare HMO

## 2015-07-17 ENCOUNTER — Encounter (HOSPITAL_COMMUNITY)
Admission: RE | Disposition: A | Discharge status: Home / Self Care | Payer: Self-pay | Source: Ambulatory Visit | Attending: Orthopedic Surgery

## 2015-07-17 DIAGNOSIS — M47817 Spondylosis without myelopathy or radiculopathy, lumbosacral region: Secondary | ICD-10-CM | POA: Diagnosis not present

## 2015-07-17 DIAGNOSIS — K219 Gastro-esophageal reflux disease without esophagitis: Secondary | ICD-10-CM | POA: Diagnosis present

## 2015-07-17 DIAGNOSIS — Z818 Family history of other mental and behavioral disorders: Secondary | ICD-10-CM | POA: Diagnosis not present

## 2015-07-17 DIAGNOSIS — I10 Essential (primary) hypertension: Secondary | ICD-10-CM | POA: Diagnosis present

## 2015-07-17 DIAGNOSIS — I4581 Long QT syndrome: Secondary | ICD-10-CM | POA: Diagnosis present

## 2015-07-17 DIAGNOSIS — E669 Obesity, unspecified: Secondary | ICD-10-CM | POA: Diagnosis present

## 2015-07-17 DIAGNOSIS — J189 Pneumonia, unspecified organism: Secondary | ICD-10-CM | POA: Diagnosis not present

## 2015-07-17 DIAGNOSIS — M25512 Pain in left shoulder: Secondary | ICD-10-CM | POA: Diagnosis present

## 2015-07-17 DIAGNOSIS — Z96612 Presence of left artificial shoulder joint: Secondary | ICD-10-CM

## 2015-07-17 DIAGNOSIS — Z23 Encounter for immunization: Secondary | ICD-10-CM | POA: Diagnosis not present

## 2015-07-17 DIAGNOSIS — F1120 Opioid dependence, uncomplicated: Secondary | ICD-10-CM | POA: Diagnosis present

## 2015-07-17 DIAGNOSIS — Z8711 Personal history of peptic ulcer disease: Secondary | ICD-10-CM

## 2015-07-17 DIAGNOSIS — M797 Fibromyalgia: Secondary | ICD-10-CM | POA: Diagnosis present

## 2015-07-17 DIAGNOSIS — J449 Chronic obstructive pulmonary disease, unspecified: Secondary | ICD-10-CM | POA: Diagnosis not present

## 2015-07-17 DIAGNOSIS — T402X5A Adverse effect of other opioids, initial encounter: Secondary | ICD-10-CM | POA: Diagnosis not present

## 2015-07-17 DIAGNOSIS — F319 Bipolar disorder, unspecified: Secondary | ICD-10-CM | POA: Diagnosis present

## 2015-07-17 DIAGNOSIS — F329 Major depressive disorder, single episode, unspecified: Secondary | ICD-10-CM | POA: Diagnosis not present

## 2015-07-17 DIAGNOSIS — Z720 Tobacco use: Secondary | ICD-10-CM | POA: Diagnosis present

## 2015-07-17 DIAGNOSIS — R000 Tachycardia, unspecified: Secondary | ICD-10-CM | POA: Insufficient documentation

## 2015-07-17 DIAGNOSIS — J42 Unspecified chronic bronchitis: Secondary | ICD-10-CM | POA: Diagnosis not present

## 2015-07-17 DIAGNOSIS — J9811 Atelectasis: Secondary | ICD-10-CM | POA: Diagnosis not present

## 2015-07-17 DIAGNOSIS — Z96619 Presence of unspecified artificial shoulder joint: Secondary | ICD-10-CM | POA: Insufficient documentation

## 2015-07-17 DIAGNOSIS — Z7982 Long term (current) use of aspirin: Secondary | ICD-10-CM | POA: Diagnosis not present

## 2015-07-17 DIAGNOSIS — F17210 Nicotine dependence, cigarettes, uncomplicated: Secondary | ICD-10-CM | POA: Diagnosis present

## 2015-07-17 DIAGNOSIS — F411 Generalized anxiety disorder: Secondary | ICD-10-CM | POA: Diagnosis present

## 2015-07-17 DIAGNOSIS — A419 Sepsis, unspecified organism: Secondary | ICD-10-CM | POA: Diagnosis present

## 2015-07-17 DIAGNOSIS — M19012 Primary osteoarthritis, left shoulder: Secondary | ICD-10-CM | POA: Diagnosis present

## 2015-07-17 DIAGNOSIS — F32A Depression, unspecified: Secondary | ICD-10-CM | POA: Diagnosis present

## 2015-07-17 HISTORY — PX: TOTAL SHOULDER ARTHROPLASTY: SHX126

## 2015-07-17 HISTORY — DX: Primary osteoarthritis, left shoulder: M19.012

## 2015-07-17 SURGERY — ARTHROPLASTY, SHOULDER, TOTAL
Anesthesia: Regional | Laterality: Left

## 2015-07-17 MED ORDER — DIAZEPAM 5 MG PO TABS: 5.0000 mg | ORAL_TABLET | Freq: Four times a day (QID) | ORAL | Status: DC | PRN

## 2015-07-17 MED ORDER — MIDAZOLAM HCL 5 MG/5ML IJ SOLN
INTRAMUSCULAR | Status: DC | PRN
  Administered 2015-07-17: 2 mg via INTRAVENOUS

## 2015-07-17 MED ORDER — AMITRIPTYLINE HCL 50 MG PO TABS
50.0000 mg | ORAL_TABLET | Freq: Every day | ORAL | Status: DC
  Administered 2015-07-17 – 2015-07-18 (×2): 50 mg via ORAL
  Filled 2015-07-17 (×2): qty 1

## 2015-07-17 MED ORDER — POLYETHYLENE GLYCOL 3350 17 G PO PACK: 17.0000 g | PACK | Freq: Every day | ORAL | Status: DC | PRN

## 2015-07-17 MED ORDER — SODIUM CHLORIDE 0.9 % IR SOLN
Status: DC | PRN
  Administered 2015-07-17: 3000 mL

## 2015-07-17 MED ORDER — METOCLOPRAMIDE HCL 5 MG/ML IJ SOLN: 5.0000 mg | Freq: Three times a day (TID) | INTRAMUSCULAR | Status: DC | PRN

## 2015-07-17 MED ORDER — BISACODYL 5 MG PO TBEC: 5.0000 mg | DELAYED_RELEASE_TABLET | Freq: Every day | ORAL | Status: DC | PRN

## 2015-07-17 MED ORDER — MENTHOL 3 MG MT LOZG
1.0000 | LOZENGE | OROMUCOSAL | Status: DC | PRN
Start: 2015-07-17 — End: 2015-07-21

## 2015-07-17 MED ORDER — MAGNESIUM CITRATE PO SOLN: 1.0000 | Freq: Once | ORAL | Status: DC | PRN

## 2015-07-17 MED ORDER — PHENYLEPHRINE HCL 10 MG/ML IJ SOLN
10.0000 mg | INTRAVENOUS | Status: DC | PRN
  Administered 2015-07-17: 10 ug/min via INTRAVENOUS

## 2015-07-17 MED ORDER — FENTANYL CITRATE (PF) 100 MCG/2ML IJ SOLN
INTRAMUSCULAR | Status: DC | PRN
  Administered 2015-07-17: 100 ug via INTRAVENOUS
  Administered 2015-07-17 (×2): 50 ug via INTRAVENOUS

## 2015-07-17 MED ORDER — DEXTROSE 5 % IV SOLN
3.0000 g | Freq: Four times a day (QID) | INTRAVENOUS | Status: AC
  Administered 2015-07-17 – 2015-07-18 (×3): 3 g via INTRAVENOUS
  Filled 2015-07-17 (×3): qty 3000

## 2015-07-17 MED ORDER — OXYCODONE HCL 5 MG PO TABS
10.0000 mg | ORAL_TABLET | ORAL | Status: DC | PRN
  Administered 2015-07-17: 10 mg via ORAL
  Administered 2015-07-18 (×2): 15 mg via ORAL
  Filled 2015-07-17: qty 2
  Filled 2015-07-17: qty 3
  Filled 2015-07-17: qty 2
  Filled 2015-07-17: qty 4
  Filled 2015-07-17: qty 3

## 2015-07-17 MED ORDER — ONDANSETRON HCL 4 MG PO TABS: 4.0000 mg | ORAL_TABLET | Freq: Four times a day (QID) | ORAL | Status: DC | PRN

## 2015-07-17 MED ORDER — INFLUENZA VAC SPLIT QUAD 0.5 ML IM SUSY
0.5000 mL | PREFILLED_SYRINGE | INTRAMUSCULAR | Status: AC
  Administered 2015-07-18: 0.5 mL via INTRAMUSCULAR
  Filled 2015-07-17 (×2): qty 0.5

## 2015-07-17 MED ORDER — VASOPRESSIN 20 UNIT/ML IV SOLN
INTRAVENOUS | Status: DC | PRN
  Administered 2015-07-17 (×2): .5 [IU] via INTRAVENOUS

## 2015-07-17 MED ORDER — SENNA-DOCUSATE SODIUM 8.6-50 MG PO TABS: 2.0000 | ORAL_TABLET | Freq: Every day | ORAL | Status: DC

## 2015-07-17 MED ORDER — HYDROMORPHONE HCL 2 MG PO TABS: 2.0000 mg | ORAL_TABLET | ORAL | Status: DC | PRN

## 2015-07-17 MED ORDER — ALUM & MAG HYDROXIDE-SIMETH 200-200-20 MG/5ML PO SUSP: 30.0000 mL | ORAL | Status: DC | PRN

## 2015-07-17 MED ORDER — VASOPRESSIN 20 UNIT/ML IV SOLN
INTRAVENOUS | Status: AC
  Filled 2015-07-17: qty 1

## 2015-07-17 MED ORDER — BACLOFEN 10 MG PO TABS: 10.0000 mg | ORAL_TABLET | Freq: Three times a day (TID) | ORAL | Status: DC | PRN

## 2015-07-17 MED ORDER — PNEUMOCOCCAL VAC POLYVALENT 25 MCG/0.5ML IJ INJ
0.5000 mL | INJECTION | INTRAMUSCULAR | Status: AC
  Administered 2015-07-18: 0.5 mL via INTRAMUSCULAR
  Filled 2015-07-17: qty 0.5

## 2015-07-17 MED ORDER — MIDAZOLAM HCL 2 MG/2ML IJ SOLN
INTRAMUSCULAR | Status: AC
  Filled 2015-07-17: qty 4

## 2015-07-17 MED ORDER — POTASSIUM CHLORIDE IN NACL 20-0.45 MEQ/L-% IV SOLN
INTRAVENOUS | Status: DC
  Administered 2015-07-17: 75 mL/h via INTRAVENOUS
  Filled 2015-07-17 (×4): qty 1000

## 2015-07-17 MED ORDER — ACETAMINOPHEN 650 MG RE SUPP: 650.0000 mg | Freq: Four times a day (QID) | RECTAL | Status: DC | PRN

## 2015-07-17 MED ORDER — ONDANSETRON HCL 4 MG/2ML IJ SOLN: 4.0000 mg | Freq: Four times a day (QID) | INTRAMUSCULAR | Status: DC | PRN

## 2015-07-17 MED ORDER — HYDROMORPHONE HCL 1 MG/ML IJ SOLN
1.0000 mg | INTRAMUSCULAR | Status: DC | PRN
  Filled 2015-07-17: qty 1

## 2015-07-17 MED ORDER — NITROGLYCERIN 0.4 MG SL SUBL: 0.4000 mg | SUBLINGUAL_TABLET | SUBLINGUAL | Status: DC | PRN

## 2015-07-17 MED ORDER — FENTANYL CITRATE (PF) 250 MCG/5ML IJ SOLN
INTRAMUSCULAR | Status: AC
  Filled 2015-07-17: qty 5

## 2015-07-17 MED ORDER — DIPHENHYDRAMINE HCL 12.5 MG/5ML PO ELIX
12.5000 mg | ORAL_SOLUTION | ORAL | Status: DC | PRN
  Administered 2015-07-18: 25 mg via ORAL
  Filled 2015-07-17: qty 10

## 2015-07-17 MED ORDER — METHOCARBAMOL 1000 MG/10ML IJ SOLN: 500.0000 mg | Freq: Four times a day (QID) | INTRAVENOUS | Status: DC | PRN

## 2015-07-17 MED ORDER — LACTATED RINGERS IV SOLN
INTRAVENOUS | Status: DC | PRN
  Administered 2015-07-17 (×2): via INTRAVENOUS

## 2015-07-17 MED ORDER — NEOSTIGMINE METHYLSULFATE 10 MG/10ML IV SOLN
INTRAVENOUS | Status: DC | PRN
  Administered 2015-07-17: 5 mg via INTRAVENOUS

## 2015-07-17 MED ORDER — BUPIVACAINE-EPINEPHRINE (PF) 0.5% -1:200000 IJ SOLN
INTRAMUSCULAR | Status: DC | PRN
  Administered 2015-07-17: 30 mL via PERINEURAL

## 2015-07-17 MED ORDER — 0.9 % SODIUM CHLORIDE (POUR BTL) OPTIME
TOPICAL | Status: DC | PRN
  Administered 2015-07-17: 1000 mL

## 2015-07-17 MED ORDER — NICOTINE 14 MG/24HR TD PT24
14.0000 mg | MEDICATED_PATCH | Freq: Every day | TRANSDERMAL | Status: DC
  Administered 2015-07-17 – 2015-07-21 (×5): 14 mg via TRANSDERMAL
  Filled 2015-07-17 (×5): qty 1

## 2015-07-17 MED ORDER — OXYCODONE HCL 5 MG PO TABS: 15.0000 mg | ORAL_TABLET | Freq: Three times a day (TID) | ORAL | Status: DC | PRN

## 2015-07-17 MED ORDER — BISACODYL 10 MG RE SUPP: 10.0000 mg | Freq: Every day | RECTAL | Status: DC | PRN

## 2015-07-17 MED ORDER — HYDROMORPHONE HCL 1 MG/ML IJ SOLN: 0.2500 mg | INTRAMUSCULAR | Status: DC | PRN

## 2015-07-17 MED ORDER — LIDOCAINE HCL (CARDIAC) 20 MG/ML IV SOLN
INTRAVENOUS | Status: DC | PRN
  Administered 2015-07-17: 100 mg via INTRAVENOUS

## 2015-07-17 MED ORDER — SENNA 8.6 MG PO TABS
1.0000 | ORAL_TABLET | Freq: Two times a day (BID) | ORAL | Status: DC
  Administered 2015-07-17 – 2015-07-21 (×8): 8.6 mg via ORAL
  Filled 2015-07-17 (×8): qty 1

## 2015-07-17 MED ORDER — DULOXETINE HCL 60 MG PO CPEP
60.0000 mg | ORAL_CAPSULE | Freq: Every day | ORAL | Status: DC
  Administered 2015-07-17 – 2015-07-21 (×5): 60 mg via ORAL
  Filled 2015-07-17 (×5): qty 1

## 2015-07-17 MED ORDER — DEXAMETHASONE SODIUM PHOSPHATE 4 MG/ML IJ SOLN
INTRAMUSCULAR | Status: DC | PRN
  Administered 2015-07-17: 8 mg via INTRAVENOUS

## 2015-07-17 MED ORDER — ASPIRIN 81 MG PO CHEW
81.0000 mg | CHEWABLE_TABLET | Freq: Every day | ORAL | Status: DC
  Administered 2015-07-17 – 2015-07-20 (×4): 81 mg via ORAL
  Filled 2015-07-17 (×4): qty 1

## 2015-07-17 MED ORDER — PHENOL 1.4 % MT LIQD: 1.0000 | OROMUCOSAL | Status: DC | PRN

## 2015-07-17 MED ORDER — MEPERIDINE HCL 25 MG/ML IJ SOLN: 6.2500 mg | INTRAMUSCULAR | Status: DC | PRN

## 2015-07-17 MED ORDER — SUCCINYLCHOLINE CHLORIDE 20 MG/ML IJ SOLN
INTRAMUSCULAR | Status: DC | PRN
  Administered 2015-07-17: 140 mg via INTRAVENOUS

## 2015-07-17 MED ORDER — ACETAMINOPHEN 325 MG PO TABS
650.0000 mg | ORAL_TABLET | Freq: Four times a day (QID) | ORAL | Status: DC | PRN
  Administered 2015-07-19 – 2015-07-21 (×8): 650 mg via ORAL
  Filled 2015-07-17 (×8): qty 2

## 2015-07-17 MED ORDER — DOCUSATE SODIUM 100 MG PO CAPS
100.0000 mg | ORAL_CAPSULE | Freq: Two times a day (BID) | ORAL | Status: DC
  Administered 2015-07-17 – 2015-07-21 (×8): 100 mg via ORAL
  Filled 2015-07-17 (×8): qty 1

## 2015-07-17 MED ORDER — MORPHINE SULFATE ER 15 MG PO TBCR
15.0000 mg | EXTENDED_RELEASE_TABLET | Freq: Two times a day (BID) | ORAL | Status: DC
  Administered 2015-07-17 – 2015-07-18 (×3): 15 mg via ORAL
  Filled 2015-07-17 (×4): qty 1

## 2015-07-17 MED ORDER — GLYCOPYRROLATE 0.2 MG/ML IJ SOLN
INTRAMUSCULAR | Status: DC | PRN
  Administered 2015-07-17: 0.6 mg via INTRAVENOUS

## 2015-07-17 MED ORDER — OXYCODONE-ACETAMINOPHEN 10-325 MG PO TABS
1.0000 | ORAL_TABLET | Freq: Four times a day (QID) | ORAL | Status: DC | PRN
Start: 2015-07-17 — End: 2015-10-01

## 2015-07-17 MED ORDER — ONDANSETRON HCL 4 MG/2ML IJ SOLN
INTRAMUSCULAR | Status: DC | PRN
  Administered 2015-07-17: 4 mg via INTRAVENOUS

## 2015-07-17 MED ORDER — PROPOFOL 10 MG/ML IV BOLUS
INTRAVENOUS | Status: DC | PRN
Start: 2015-07-17 — End: 2015-07-17
  Administered 2015-07-17: 200 mg via INTRAVENOUS

## 2015-07-17 MED ORDER — PROPOFOL 10 MG/ML IV BOLUS
INTRAVENOUS | Status: AC
  Filled 2015-07-17: qty 20

## 2015-07-17 MED ORDER — METHOCARBAMOL 500 MG PO TABS
500.0000 mg | ORAL_TABLET | Freq: Four times a day (QID) | ORAL | Status: DC | PRN
  Administered 2015-07-17 – 2015-07-18 (×2): 500 mg via ORAL
  Filled 2015-07-17 (×2): qty 1

## 2015-07-17 MED ORDER — HYDROCHLOROTHIAZIDE 25 MG PO TABS
12.5000 mg | ORAL_TABLET | Freq: Every day | ORAL | Status: DC
  Administered 2015-07-17 – 2015-07-18 (×2): 12.5 mg via ORAL
  Filled 2015-07-17 (×2): qty 1

## 2015-07-17 MED ORDER — ALPRAZOLAM 0.5 MG PO TABS
1.0000 mg | ORAL_TABLET | Freq: Three times a day (TID) | ORAL | Status: DC
  Administered 2015-07-17 – 2015-07-18 (×4): 1 mg via ORAL
  Filled 2015-07-17 (×4): qty 2

## 2015-07-17 MED ORDER — DIVALPROEX SODIUM ER 500 MG PO TB24
1000.0000 mg | ORAL_TABLET | Freq: Every evening | ORAL | Status: DC
  Administered 2015-07-17 – 2015-07-20 (×4): 1000 mg via ORAL
  Filled 2015-07-17 (×6): qty 2

## 2015-07-17 MED ORDER — PHENYLEPHRINE HCL 10 MG/ML IJ SOLN
INTRAMUSCULAR | Status: DC | PRN
  Administered 2015-07-17 (×2): 80 ug via INTRAVENOUS

## 2015-07-17 MED ORDER — ROCURONIUM BROMIDE 100 MG/10ML IV SOLN
INTRAVENOUS | Status: DC | PRN
  Administered 2015-07-17: 20 mg via INTRAVENOUS
  Administered 2015-07-17: 10 mg via INTRAVENOUS
  Administered 2015-07-17: 50 mg via INTRAVENOUS

## 2015-07-17 MED ORDER — PROMETHAZINE HCL 25 MG/ML IJ SOLN: 6.2500 mg | INTRAMUSCULAR | Status: DC | PRN

## 2015-07-17 MED ORDER — METOCLOPRAMIDE HCL 5 MG PO TABS: 5.0000 mg | ORAL_TABLET | Freq: Three times a day (TID) | ORAL | Status: DC | PRN

## 2015-07-17 SURGICAL SUPPLY — 71 items
BIT DRILL 5/64X5 DISP (BIT) ×3 IMPLANT
BLADE SAW SGTL MED 73X18.5 STR (BLADE) ×3 IMPLANT
BRUSH FEMORAL CANAL (MISCELLANEOUS) IMPLANT
CAPT SHLDR TOTAL 2 ×2 IMPLANT
CEMENT BONE DEPUY (Cement) ×3 IMPLANT
CLOSURE STERI-STRIP 1/2X4 (GAUZE/BANDAGES/DRESSINGS) ×1
CLSR STERI-STRIP ANTIMIC 1/2X4 (GAUZE/BANDAGES/DRESSINGS) ×2 IMPLANT
COVER SURGICAL LIGHT HANDLE (MISCELLANEOUS) ×3 IMPLANT
COVER TABLE BACK 60X90 (DRAPES) IMPLANT
DRAPE ORTHO SPLIT 77X108 STRL (DRAPES) ×6
DRAPE PROXIMA HALF (DRAPES) ×3 IMPLANT
DRAPE SURG ORHT 6 SPLT 77X108 (DRAPES) ×2 IMPLANT
DRAPE U-SHAPE 47X51 STRL (DRAPES) ×3 IMPLANT
DRSG MEPILEX BORDER 4X8 (GAUZE/BANDAGES/DRESSINGS) ×3 IMPLANT
DURAPREP 26ML APPLICATOR (WOUND CARE) ×3 IMPLANT
ELECT REM PT RETURN 9FT ADLT (ELECTROSURGICAL) ×3
ELECTRODE REM PT RTRN 9FT ADLT (ELECTROSURGICAL) ×1 IMPLANT
EVACUATOR 1/8 PVC DRAIN (DRAIN) IMPLANT
FACESHIELD WRAPAROUND (MASK) ×3 IMPLANT
FACESHIELD WRAPAROUND OR TEAM (MASK) ×1 IMPLANT
GLOVE BIO SURGEON STRL SZ 6.5 (GLOVE) ×1 IMPLANT
GLOVE BIO SURGEON STRL SZ7 (GLOVE) ×4 IMPLANT
GLOVE BIO SURGEONS STRL SZ 6.5 (GLOVE) ×1
GLOVE BIOGEL PI IND STRL 6.5 (GLOVE) IMPLANT
GLOVE BIOGEL PI IND STRL 8 (GLOVE) ×1 IMPLANT
GLOVE BIOGEL PI INDICATOR 6.5 (GLOVE) ×4
GLOVE BIOGEL PI INDICATOR 8 (GLOVE) ×2
GLOVE BIOGEL PI ORTHO PRO SZ8 (GLOVE) ×2
GLOVE ORTHO TXT STRL SZ7.5 (GLOVE) ×3 IMPLANT
GLOVE PI ORTHO PRO STRL SZ8 (GLOVE) ×1 IMPLANT
GLOVE SURG ORTHO 8.0 STRL STRW (GLOVE) ×6 IMPLANT
GOWN STRL REUS W/ TWL LRG LVL3 (GOWN DISPOSABLE) ×1 IMPLANT
GOWN STRL REUS W/ TWL XL LVL3 (GOWN DISPOSABLE) ×1 IMPLANT
GOWN STRL REUS W/TWL 2XL LVL3 (GOWN DISPOSABLE) ×3 IMPLANT
GOWN STRL REUS W/TWL LRG LVL3 (GOWN DISPOSABLE) ×9
GOWN STRL REUS W/TWL XL LVL3 (GOWN DISPOSABLE) ×3
HANDPIECE INTERPULSE COAX TIP (DISPOSABLE) ×3
HOOD PEEL AWAY FACE SHEILD DIS (HOOD) ×3 IMPLANT
IMMOBILIZER SHOULDER FOAM XLGE (SOFTGOODS) ×2 IMPLANT
KIT BASIN OR (CUSTOM PROCEDURE TRAY) ×3 IMPLANT
KIT ROOM TURNOVER OR (KITS) ×3 IMPLANT
MANIFOLD NEPTUNE II (INSTRUMENTS) ×3 IMPLANT
NDL 1/2 CIR CATGUT .05X1.09 (NEEDLE) IMPLANT
NDL HYPO 25GX1X1/2 BEV (NEEDLE) IMPLANT
NEEDLE 1/2 CIR CATGUT .05X1.09 (NEEDLE) IMPLANT
NEEDLE HYPO 25GX1X1/2 BEV (NEEDLE) IMPLANT
NS IRRIG 1000ML POUR BTL (IV SOLUTION) ×3 IMPLANT
PACK SHOULDER (CUSTOM PROCEDURE TRAY) ×3 IMPLANT
PAD ARMBOARD 7.5X6 YLW CONV (MISCELLANEOUS) ×6 IMPLANT
PIN HUMERAL STMN 3.2MMX9IN (INSTRUMENTS) ×2 IMPLANT
SET HNDPC FAN SPRY TIP SCT (DISPOSABLE) ×1 IMPLANT
SLING ARM IMMOBILIZER LRG (SOFTGOODS) IMPLANT
SLING ARM IMMOBILIZER MED (SOFTGOODS) IMPLANT
SMARTMIX MINI TOWER (MISCELLANEOUS) ×3
SPONGE LAP 18X18 X RAY DECT (DISPOSABLE) ×3 IMPLANT
SUCTION FRAZIER TIP 10 FR DISP (SUCTIONS) ×3 IMPLANT
SUPPORT WRAP ARM LG (MISCELLANEOUS) ×3 IMPLANT
SUT FIBERWIRE #2 38 REV NDL BL (SUTURE) ×3
SUT MAXBRAID (SUTURE) ×14 IMPLANT
SUT MNCRL AB 4-0 PS2 18 (SUTURE) IMPLANT
SUT VIC AB 0 CT1 27 (SUTURE) ×3
SUT VIC AB 0 CT1 27XBRD ANBCTR (SUTURE) ×1 IMPLANT
SUT VIC AB 2-0 CT1 27 (SUTURE)
SUT VIC AB 2-0 CT1 TAPERPNT 27 (SUTURE) IMPLANT
SUT VIC AB 3-0 SH 8-18 (SUTURE) ×3 IMPLANT
SUTURE FIBERWR#2 38 REV NDL BL (SUTURE) IMPLANT
SYR CONTROL 10ML LL (SYRINGE) IMPLANT
TOWEL OR 17X24 6PK STRL BLUE (TOWEL DISPOSABLE) ×3 IMPLANT
TOWEL OR 17X26 10 PK STRL BLUE (TOWEL DISPOSABLE) ×3 IMPLANT
TOWER SMARTMIX MINI (MISCELLANEOUS) ×1 IMPLANT
WATER STERILE IRR 1000ML POUR (IV SOLUTION) ×3 IMPLANT

## 2015-07-17 NOTE — Progress Notes (Signed)
PT Cancellation Note  Patient Details Name: CACHE DECOURSEY MRN: [id number] DOB: 27-Nov-1965   Cancelled Treatment:    Reason Eval/Treat Not Completed: PT screened, no needs identified, will sign off Per OT, pt ambulating supervision and does not need PT while in hospital. Independent PTA. Will sign off for now. Please reconsult if anything changes or any new concerns.   Marguarite Arbour A Ashtan Laton 07/17/2015, 3:54 PM  Wray Kearns, [place], DPT [phone]

## 2015-07-17 NOTE — H&P (Signed)
PREOPERATIVE H&P  Chief Complaint: OA LEFT SHOULDER  HPI: [name] is a 49 y.o. female who presents for preoperative history and physical with a diagnosis of OA LEFT SHOULDER. Symptoms are rated as moderate to severe, and have been worsening.  This is significantly impairing activities of daily living.  She has elected for surgical management.   She has failed injections, activity modification, anti-inflammatories, chronic narcotics, and assistive devices.  Preoperative X-rays demonstrate end stage degenerative changes with osteophyte formation, loss of joint space, subchondral sclerosis.  Past Medical History  Diagnosis Date  . Fibromyalgia   . Bipolar 1 disorder ([place])   . Spinal stenosis   . Osteoarthritis   . Hypertension   . Obesity   . GERD (gastroesophageal reflux disease)   . PUD (peptic ulcer disease)   . Anxiety   . COPD (chronic obstructive pulmonary disease) [facility])    Past Surgical History  Procedure Laterality Date  . Abdominal hysterectomy    . Arthroscopic knee    . Cardiovascular stress test  03/2010    Lexiscan Myoview: EF 55%, mild breast attenuation, no ischemia or scar  . Transthoracic echocardiogram  03/2010    EF 50-55%, normal LV size, no WMAs  . Tonsillectomy     Social History   Social History  . Marital Status: Divorced    Spouse Name: N/A  . Number of Children: N/A  . Years of Education: N/A   Social History Main Topics  . Smoking status: Current Every Day Smoker -- 0.50 packs/day    Types: Cigarettes  . Smokeless tobacco: Never Used  . Alcohol Use: No  . Drug Use: Yes    Special: Morphine, Oxycodone     Comment: goes to pain clinic  . Sexual Activity: Not Asked   Other Topics Concern  . None   Social History Narrative   Family History  Problem Relation Age of Onset  . Diabetes Other   . Hypertension Other   . Schizophrenia Mother   . Hypertension Mother   . Stroke Father    Allergies  Allergen Reactions  . Abilify  [Aripiprazole] Other (See Comments)    Causes body shaking and tremors  . Latuda [Lurasidone Hcl] Other (See Comments)    Causes body shaking and tremors  . Naproxen Anaphylaxis   Prior to Admission medications   Medication Sig Start Date End Date Taking? Authorizing Provider  ALPRAZolam Duanne Moron) 1 MG tablet Take 1 mg by mouth 3 (three) times daily.  10/30/14  Yes Historical Provider, MD  amitriptyline (ELAVIL) 50 MG tablet One tablet at bedtime 04/18/15  Yes Bayard Hugger, NP  aspirin 81 MG chewable tablet Chew 81 mg by mouth at bedtime.    Yes Historical Provider, MD  baclofen (LIORESAL) 10 MG tablet Take 1 tablet (10 mg total) by mouth 3 (three) times daily. Patient taking differently: Take 10 mg by mouth 3 (three) times daily as needed for muscle spasms.  05/28/15  Yes Bayard Hugger, NP  bisacodyl (BISACODYL) 5 MG EC tablet Take 1 tablet (5 mg total) by mouth daily as needed for moderate constipation. 05/28/15  Yes Bayard Hugger, NP  divalproex (DEPAKOTE ER) 500 MG 24 hr tablet Take 1,000 mg by mouth every evening.    Yes Historical Provider, MD  DULoxetine (CYMBALTA) 60 MG capsule Take 1 capsule (60 mg total) by mouth daily. 05/28/15  Yes Bayard Hugger, NP  hydrochlorothiazide (HYDRODIURIL) 12.5 MG tablet Take 12.5 mg by mouth daily.  06/19/15  Yes Historical Provider, MD  morphine (MS CONTIN) 15 MG 12 hr tablet Take 1 tablet (15 mg total) by mouth every 12 (twelve) hours. 06/25/15  Yes Bayard Hugger, NP  nitroGLYCERIN (NITROSTAT) 0.4 MG SL tablet Place 0.4 mg under the tongue every 5 (five) minutes as needed. 06/11/15  Yes Historical Provider, MD  oxyCODONE (ROXICODONE) 15 MG immediate release tablet Take 1 tablet (15 mg total) by mouth every 8 (eight) hours as needed for pain. 06/25/15  Yes Bayard Hugger, NP     Positive ROS: All other systems have been reviewed and were otherwise negative with the exception of those mentioned in the HPI and as above.  Physical Exam: General: Alert,  no acute distress Cardiovascular: No pedal edema Respiratory: No cyanosis, no use of accessory musculature GI: No organomegaly, abdomen is soft and non-tender Skin: No lesions in the area of chief complaint Neurologic: Sensation intact distally Psychiatric: Patient is competent for consent with normal mood and affect Lymphatic: No axillary or cervical lymphadenopathy  MUSCULOSKELETAL: 0-30 degrees forward flexion, ER 10, with crepitance.  No pain over the [facility] joint.  Assessment: OA LEFT SHOULDER   Plan: Plan for Procedure(s): TOTAL SHOULDER ARTHROPLASTY  The risks benefits and alternatives were discussed with the patient including but not limited to the risks of nonoperative treatment, versus surgical intervention including infection, bleeding, nerve injury,  blood clots, cardiopulmonary complications, morbidity, mortality, among others, and they were willing to proceed.   Johnny Bridge, MD Cell [phone]   07/17/2015 7:20 AM

## 2015-07-17 NOTE — Transfer of Care (Signed)
Immediate Anesthesia Transfer of Care Note  Patient: [name]  Procedure(s) Performed: Procedure(s): TOTAL SHOULDER ARTHROPLASTY (Left)  Patient Location: PACU  Anesthesia Type:GA combined with regional for post-op pain  Level of Consciousness: awake, alert , oriented, patient cooperative and responds to stimulation  Airway & Oxygen Therapy: Patient Spontanous Breathing and Patient connected to face mask oxygen  Post-op Assessment: Report given to RN, Post -op Vital signs reviewed and stable, Patient moving all extremities X 4 and Patient able to stick tongue midline  Post vital signs: Reviewed and stable  Last Vitals:  Filed Vitals:   07/17/15 1022  BP:   Pulse:   Temp: 36.7 C  Resp:     Complications: No apparent anesthesia complications

## 2015-07-17 NOTE — Progress Notes (Signed)
Orthopedic Tech Progress Note Patient Details:  [name] 05-Aug-1966 [id number]  Ortho Devices Type of Ortho Device: Shoulder immobilizer Ortho Device/Splint Interventions: Application   [name] 07/17/2015, 4:56 PM

## 2015-07-17 NOTE — Anesthesia Postprocedure Evaluation (Signed)
Anesthesia Post Note  Patient: [name]  Procedure(s) Performed: Procedure(s) (LRB): TOTAL SHOULDER ARTHROPLASTY (Left)  Anesthesia type: General + ISB  Patient location: PACU  Post pain: Pain level controlled  Post assessment: Post-op Vital signs reviewed  Last Vitals: BP 102/63 mmHg  Pulse 109  Temp(Src) 36.8 C (Oral)  Resp 16  SpO2 95%  Post vital signs: Reviewed  Level of consciousness: sedated  Complications: No apparent anesthesia complications

## 2015-07-17 NOTE — Anesthesia Procedure Notes (Addendum)
Anesthesia Regional Block:  Interscalene brachial plexus block  Pre-Anesthetic Checklist: ,, timeout performed, Correct Patient, Correct Site, Correct Laterality, Correct Procedure, Correct Position, site marked, Risks and benefits discussed, Surgical consent,  Pre-op evaluation,  Post-op pain management  Laterality: Left  Prep: chloraprep       Needles:  Injection technique: Single-shot  Needle Type: Stimulator Needle - 40     Needle Length: 4cm 4 cm Needle Gauge: 22 and 22 G    Additional Needles:  Procedures: ultrasound guided (picture in chart) Interscalene brachial plexus block Narrative:  Injection made incrementally with aspirations every 5 mL.  Performed by: Personally  Anesthesiologist: Nolon Nations  Additional Notes: BP cuff, EKG monitors applied. Sedation begun. Nerve location verified with U/S. Anesthetic injected incrementally, slowly , and after neg aspirations under direct u/s guidance. Good perineural spread. Tolerated well.   Procedure Name: Intubation Date/Time: 07/17/2015 7:50 AM Performed by: Vennie Homans Pre-anesthesia Checklist: Patient identified, Timeout performed, Emergency Drugs available, Suction available and Patient being monitored Patient Re-evaluated:Patient Re-evaluated prior to inductionOxygen Delivery Method: Circle system utilized Preoxygenation: Pre-oxygenation with 100% oxygen Intubation Type: IV induction Ventilation: Two handed mask ventilation required Laryngoscope Size: Glidescope and 3 Grade View: Grade IV Tube type: Oral Tube size: 7.5 mm Number of attempts: 3 Airway Equipment and Method: Patient positioned with wedge pillow and Video-laryngoscopy Placement Confirmation: ETT inserted through vocal cords under direct vision,  positive ETCO2 and breath sounds checked- equal and bilateral Secured at: 23 cm Tube secured with: Tape Dental Injury: Teeth and Oropharynx as per pre-operative assessment  Difficulty Due To:  Difficulty was anticipated

## 2015-07-17 NOTE — Progress Notes (Signed)
Utilization review completed.  

## 2015-07-17 NOTE — Progress Notes (Signed)
Received report from Mark RN

## 2015-07-17 NOTE — Evaluation (Addendum)
Occupational Therapy Evaluation Patient Details Name: [name] MRN: [id number] DOB: Jun 29, 1966 Today's Date: 07/17/2015    History of Present Illness 49 y.o. s/p Lt TSA.   Clinical Impression   Pt s/p above. Pt independent with ADLs, PTA. Feel pt will benefit from acute OT to increase independence and address LUE prior to d/c.     Follow Up Recommendations  No OT follow up    Equipment Recommendations  Other (comment) (TBD)    Recommendations for Other Services       Precautions / Restrictions Precautions Precautions: Shoulder Type of Shoulder Precautions: Conservative Protocol: No shoulder ROM, okay for AROM elbow, wrist, hand; no pushing, pulling, lifting with LUE (can use to hold light items) Shoulder Interventions: Shoulder sling/immobilizer;Off for dressing/bathing/exercises;At all times Precaution Booklet Issued: Yes (comment) Precaution Comments: educated on precautions Required Braces or Orthoses: Sling Restrictions Weight Bearing Restrictions: Yes LUE Weight Bearing: Non weight bearing      Mobility Bed Mobility Overal bed mobility: Needs Assistance Bed Mobility: Supine to Sit;Sit to Supine     Supine to sit: Supervision Sit to supine: Supervision      Transfers Overall transfer level: Needs assistance   Transfers: Sit to/from Stand Sit to Stand: Min assist         General transfer comment: Min assist for initial stand from bed to support LUE then able to stand with supervision assist from toilet and bed.     Balance Overall balance assessment: No apparent balance deficits (not formally assessed)                                          ADL Overall ADL's : Needs assistance/impaired     Grooming: Wash/dry hands;Standing;Set up;Supervision/safety                   Toilet Transfer: Supervision/safety;Ambulation;Comfort height toilet (Min assist for initial stand from bed to support LUE)   Toileting- Clothing  Manipulation and Hygiene: Supervision/safety;Sit to/from stand;Set up       Functional mobility during ADLs: Supervision/safety       Vision     Perception     Praxis      Pertinent Vitals/Pain Pain Assessment: 0-10 Pain Score:  (2 in left digits and 7 in buttocks) Pain Location: left digits and buttocks Pain Descriptors / Indicators: Tingling Pain Intervention(s): Repositioned;Monitored during session;Ice applied  HR up to 136 in session. HR hanging around 120s towards beginning of sesison.     Hand Dominance Right   Extremity/Trunk Assessment Upper Extremity Assessment Upper Extremity Assessment: LUE deficits/detail LUE Deficits / Details: unable to actively move wrist or elbow yet; minimal finger movement LUE Sensation: decreased light touch LUE Coordination: decreased fine motor;decreased gross motor   Lower Extremity Assessment Lower Extremity Assessment: Overall WFL for tasks assessed       Communication Communication Communication: No difficulties   Cognition Arousal/Alertness: Awake/alert Behavior During Therapy: WFL for tasks assessed/performed Overall Cognitive Status: Within Functional Limits for tasks assessed                     General Comments       Exercises Exercises: Shoulder;Other exercises  AAROM of left digits; PROM left elbow flexion/extension and PROM left wrist flexion/extension     Shoulder Instructions Shoulder Instructions Donning/doffing shirt without moving shoulder:  (educated on technique/clothing) Method for sponge bathing under  operated UE:  (educatd) Donning/doffing sling/immobilizer:  (educated/demonstrated) Correct positioning of sling/immobilizer:  (educated/demonstrated) ROM for elbow, wrist and digits of operated UE:  (educated and assisted due to numbness) Sling wearing schedule (on at all times/off for ADL's): Supervision/safety (educated) Proper positioning of operated UE when showering:   (educated) Positioning of UE while sleeping:  (educated)    Home Living Family/patient expects to be discharged to:: Private residence Living Arrangements: Other relatives Available Help at Discharge: Family;Available PRN/intermittently Type of Home: House Home Access: Stairs to enter [employer] of Steps: 1 Entrance Stairs-Rails: Right;Left Home Layout: One level     Bathroom Shower/Tub: Teacher, early years/pre: Standard                Prior Functioning/Environment Level of Independence: Independent             OT Diagnosis: Acute pain   OT Problem List: Pain;Impaired UE functional use;Impaired sensation;Decreased knowledge of precautions;Decreased knowledge of use of DME or AE;Decreased coordination;Decreased range of motion;Decreased activity tolerance   OT Treatment/Interventions: Self-care/ADL training;DME and/or AE instruction;Therapeutic activities;Patient/family education;Balance training;Therapeutic exercise    OT Goals(Current goals can be found in the care plan section) Acute Rehab OT Goals Patient Stated Goal: to get food OT Goal Formulation: With patient Time For Goal Achievement: 07/24/15 Potential to Achieve Goals: Good ADL Goals Pt Will Perform Upper Body Dressing: sitting;with set-up;with supervision (with caregiver assisting as needed) Pt Will Perform Lower Body Dressing: sit to/from stand;with set-up;with supervision (with caregiver assisting as needed) Additional ADL Goal #1: Pt will independently perform HEP for LUE (elbow, wrist, hand)  OT Frequency: Min 2X/week   Barriers to D/C:            Co-evaluation              End of Session Equipment Utilized During Treatment: Other (comment) (sling) Nurse Communication: Other (comment) (check IV; elevated HR)  Activity Tolerance: Patient tolerated treatment well Patient left: in bed;with call bell/phone within reach;with family/visitor present   Time:  3086-5784 OT Time Calculation (min): 20 min Charges:  OT General Charges $OT Visit: 1 Procedure OT Evaluation $Initial OT Evaluation Tier I: 1 Procedure G-Codes: OT G-codes **NOT FOR INPATIENT CLASS** Functional Assessment Tool Used: clinical judgment Functional Limitation: Self care Self Care Current Status (O9629): At least 40 percent but less than 60 percent impaired, limited or restricted Self Care Goal Status (B2841): At least 1 percent but less than 20 percent impaired, limited or restricted  Benito Mccreedy OTR/L [phone] 07/17/2015, 3:29 PM

## 2015-07-17 NOTE — Op Note (Signed)
07/17/2015  9:50 AM  PATIENT:  [name]    PRE-OPERATIVE DIAGNOSIS:  Left shoulder primary localized osteoarthritis  POST-OPERATIVE DIAGNOSIS:  Same  PROCEDURE:  LeftTOTAL SHOULDER ARTHROPLASTY  SURGEON:  Johnny Bridge, MD  PHYSICIAN ASSISTANT: Joya Gaskins, OPA-C, present and scrubbed throughout the case, critical for completion in a timely fashion, and for retraction, instrumentation, and closure.  ANESTHESIA:   General  PREOPERATIVE INDICATIONS:  [name] is a  49 y.o. female with a diagnosis of OA LEFT SHOULDER who failed conservative measures and elected for surgical management.    The risks benefits and alternatives were discussed with the patient preoperatively including but not limited to the risks of infection, bleeding, nerve injury, cardiopulmonary complications, the need for revision surgery, dislocation, loosening, incomplete relief of pain, among others, and the patient was willing to proceed.   OPERATIVE IMPLANTS: Biomet size 11 mini press-fit humeral stem, size 46+21 Versa-dial humeral head, set in the CC position with increased coverage posteriorly, with a Medium cemented glenoid polyethylene 3 peg implant with a central regenerex noncemented post.   OPERATIVE FINDINGS: Advanced glenohumeral osteoarthritis involving the glenoid and the humeral head with substantial osteophyte formation inferiorly.   OPERATIVE PROCEDURE: The patient was brought to the operating room and placed in the supine position. General anesthesia was administered. IV antibiotics were givenIn the form of 3 g of IV Ancef.  The upper extremity was prepped and draped in usual sterile fashion. The patient was in a beachchair position with all bony prominences padded.   Time out was performed and a deltopectoral approach was carried out. The biceps tendon was tenodesed to the pectoralis tendon. The subscapularis was released, tagging it with a #2 MaxBraid, leaving a cuff of tendon for  repair.   The inferior osteophyte was removed, and release of the capsule off of the humeral side was completed. The head was dislocated, and I reamed sequentially. I placed the humeral cutting guide at 30 of retroversion, and then pinned this into place, and made my humeral neck cut. This was at the appropriate level.   I then placed deep retractors and exposed the glenoid. I excised the labrum circumferentially, taking care to protect the axillary nerve inferiorly.   I then placed a guidewire into the center position, controlling appropriate version and inclination. I then reamed over the guidewire with the small reamer, and was satisfied with the preparation. I preserved the subchondral bone in order to maximize the strength and minimize the risk for subsequent subsidence.   I then drilled the central hole for the regenerex peg, and then placed the guide, and then drilled the 3 peripheral peg holes. I had excellent bony circumferential contact.   I then cleaned the glenoid, irrigated it copiously, and then dried it and cemented the prosthesis into place. Excellent seating was achieved. I had full exposure. The cement cured, and then I turned my attention to the humeral side.   I sequentially broached, up to the selected size, with the broach set at 30 of retroversion. I then placed the real stem. I trialed with multiple heads, and the above-named component was selected. Increased posterior coverage improved the coverage. The soft tissue tension was appropriate.   I then impacted the real humeral head into place, reduced the head, and irrigated copiously. Excellent stability and range of motion was achieved. I repaired the subscapularis with 4 #2 MaxBraid, as well as the rotator interval, and irrigated copiously once more. The subcutaneous tissue was closed with  Vicryl including the deltopectoral fascia.   The skin was closed with Steri-Strips and sterile gauze was applied. She had a preoperative  nerve block. She tolerated the procedure well and there were no complications.

## 2015-07-17 NOTE — Progress Notes (Signed)
Called Dr.Germeroth for sign out  

## 2015-07-17 NOTE — Anesthesia Preprocedure Evaluation (Signed)
Anesthesia Evaluation  Patient identified by MRN, date of birth, ID band Patient awake    Reviewed: Allergy & Precautions, NPO status , Patient's Chart, lab work & pertinent test results  Airway Mallampati: II  TM Distance: >3 FB Neck ROM: Full    Dental no notable dental hx.    Pulmonary neg pulmonary ROS, COPD, Current Smoker,    Pulmonary exam normal breath sounds clear to auscultation       Cardiovascular hypertension, Pt. on medications Normal cardiovascular exam Rhythm:Regular Rate:Normal     Neuro/Psych negative neurological ROS  negative psych ROS   GI/Hepatic Neg liver ROS, PUD, GERD  ,  Endo/Other  Morbid obesity  Renal/GU negative Renal ROS     Musculoskeletal negative musculoskeletal ROS (+) Arthritis , Fibromyalgia -  Abdominal   Peds  Hematology negative hematology ROS (+)   Anesthesia Other Findings   Reproductive/Obstetrics negative OB ROS                             Anesthesia Physical Anesthesia Plan  ASA: III  Anesthesia Plan: General and Regional   Post-op Pain Management:    Induction: Intravenous  Airway Management Planned: Oral ETT  Additional Equipment:   Intra-op Plan:   Post-operative Plan: Extubation in OR  Informed Consent: I have reviewed the patients History and Physical, chart, labs and discussed the procedure including the risks, benefits and alternatives for the proposed anesthesia with the patient or authorized representative who has indicated his/her understanding and acceptance.   Dental advisory given  Plan Discussed with: CRNA  Anesthesia Plan Comments:         Anesthesia Quick Evaluation

## 2015-07-17 NOTE — Discharge Instructions (Signed)

## 2015-07-18 ENCOUNTER — Inpatient Hospital Stay (HOSPITAL_COMMUNITY): Payer: Medicare HMO

## 2015-07-18 ENCOUNTER — Encounter (HOSPITAL_COMMUNITY): Payer: Self-pay | Admitting: Orthopedic Surgery

## 2015-07-18 DIAGNOSIS — M19012 Primary osteoarthritis, left shoulder: Secondary | ICD-10-CM | POA: Diagnosis not present

## 2015-07-18 LAB — BASIC METABOLIC PANEL
ANION GAP: 11 (ref 5–15)
BUN: 9 mg/dL (ref 6–20)
CHLORIDE: 96 mmol/L — AB (ref 101–111)
CO2: 28 mmol/L (ref 22–32)
Calcium: 8.8 mg/dL — ABNORMAL LOW (ref 8.9–10.3)
Creatinine, Ser: 0.79 mg/dL (ref 0.44–1.00)
Glucose, Bld: 151 mg/dL — ABNORMAL HIGH (ref 65–99)
POTASSIUM: 4.1 mmol/L (ref 3.5–5.1)
SODIUM: 135 mmol/L (ref 135–145)

## 2015-07-18 LAB — CBC
HCT: 32 % — ABNORMAL LOW (ref 36.0–46.0)
HEMOGLOBIN: 10.4 g/dL — AB (ref 12.0–15.0)
MCH: 27.9 pg (ref 26.0–34.0)
MCHC: 32.5 g/dL (ref 30.0–36.0)
MCV: 85.8 fL (ref 78.0–100.0)
Platelets: 258 10*3/uL (ref 150–400)
RBC: 3.73 MIL/uL — AB (ref 3.87–5.11)
RDW: 14.6 % (ref 11.5–15.5)
WBC: 7.9 10*3/uL (ref 4.0–10.5)

## 2015-07-18 NOTE — Progress Notes (Signed)
Patient ID: [name], female   DOB: 05-24-1966, 49 y.o.   MRN: [id number]     Subjective:  Patient reports pain as mild.  Patient in bed and in no acute distress.  She is concerned about her elevated heart rate when she gets out of bed.  Objective:   VITALS:   Filed Vitals:   07/17/15 1142 07/17/15 1234 07/17/15 2240 07/18/15 0603  BP:  102/63 102/68 130/74  Pulse:  109 108 106  Temp: 98.7 F (37.1 C) 98.3 F (36.8 C) 98.1 F (36.7 C) 98.5 F (36.9 C)  TempSrc:   Oral Oral  Resp:  16 16 16   SpO2:   94% 96%    ABD soft Sensation intact distally Dorsiflexion/Plantar flexion intact Incision: dressing C/D/I and no drainage Good wrist and hand motion  Lab Results  Component Value Date   WBC 7.9 07/18/2015   HGB 10.4* 07/18/2015   HCT 32.0* 07/18/2015   MCV 85.8 07/18/2015   PLT 258 07/18/2015   BMET    Component Value Date/Time   NA 135 07/18/2015 0440   K 4.1 07/18/2015 0440   CL 96* 07/18/2015 0440   CO2 28 07/18/2015 0440   GLUCOSE 151* 07/18/2015 0440   BUN 9 07/18/2015 0440   CREATININE 0.79 07/18/2015 0440   CALCIUM 8.8* 07/18/2015 0440   GFRNONAA >60 07/18/2015 0440   GFRAA >60 07/18/2015 0440     Assessment/Plan: 1 Day Post-Op   Principal Problem:   Osteoarthritis of left shoulder   Advance diet Up with therapy Plan for discharge tomorrow Continue to monitor patient HR with activity  Sling at all times  NWB left upper ext. Okay for elbow wrist and hand motion but no shoulder motion   DOUGLAS PARRY, BRANDON 07/18/2015, 7:00 AM  Seen and agree with above.  Plan dc home tomorrow.    Marchia Bond, MD Cell [phone]

## 2015-07-18 NOTE — Progress Notes (Signed)
Occupational Therapy Treatment Patient Details Name: [name] MRN: [id number] DOB: 1966/08/30 Today's Date: 07/18/2015    History of present illness 49 y.o. s/p Lt TSA.   OT comments  Pt progressing toward OT goals. Pt declined practicing ADL today but was able to verbalize UB dressing/bathing technique, precautions, sling management and wear schedule, and correct positioning. Pt supervision for toilet transfer and grooming standing at sink. Pt able to demonstrate finger and wrist AROM. Pt reported dizziness with standing, resolved quickly once sitting. HR=130 following functional mobility. Continue to follow pt acutely.    Follow Up Recommendations  No OT follow up    Equipment Recommendations  Other (comment) (TBD)    Recommendations for Other Services      Precautions / Restrictions Precautions Precautions: Shoulder Type of Shoulder Precautions: Conservative Protocol: No shoulder ROM, okay for AROM elbow, wrist, hand; no pushing, pulling, lifting with LUE (can use to hold light items) Shoulder Interventions: Shoulder sling/immobilizer;Off for dressing/bathing/exercises;At all times Precaution Comments: educated on precautions Required Braces or Orthoses: Sling Restrictions Weight Bearing Restrictions: Yes LUE Weight Bearing: Non weight bearing       Mobility Bed Mobility Overal bed mobility: Needs Assistance Bed Mobility: Supine to Sit     Supine to sit: Supervision        Transfers Overall transfer level: Needs assistance   Transfers: Sit to/from Stand Sit to Stand: Supervision         General transfer comment: Supervision for safety. Sit <> stand from EOB x 1, from comfort height toilet x 1    Balance Overall balance assessment: No apparent balance deficits (not formally assessed)                                 ADL Overall ADL's : Needs assistance/impaired     Grooming: Wash/dry hands;Supervision/safety;Standing                   Toilet Transfer: Supervision/safety;Ambulation;Comfort height toilet   Toileting- Clothing Manipulation and Hygiene: Supervision/safety;Sitting/lateral lean       Functional mobility during ADLs: Supervision/safety General ADL Comments: Pt declined practicing ADL, wanted to sit down and eat lunch. Pt able to verbalize precautions, sling management and wear schedule, UB bathing/dressing technique, and positioning while in bed. Pt reported dizziness upon standing, resolved once sitting. HR=130 after functional mobility       Vision                     Perception     Praxis      Cognition   Behavior During Therapy: WFL for tasks assessed/performed Overall Cognitive Status: Within Functional Limits for tasks assessed                       Extremity/Trunk Assessment               Exercises Shoulder Exercises Wrist Flexion: AROM;Left;5 reps;Supine Digit Composite Flexion: AROM;Left;5 reps;Supine Donning/doffing sling/immobilizer: Maximal assistance;Patient able to independently direct caregiver Correct positioning of sling/immobilizer: Maximal assistance;Patient able to independently direct caregiver ROM for elbow, wrist and digits of operated UE: Supervision/safety Sling wearing schedule (on at all times/off for ADL's): Supervision/safety Proper positioning of operated UE when showering: Maximal assistance;Patient able to independently direct caregiver Positioning of UE while sleeping: Maximal assistance;Patient able to independently direct caregiver   Shoulder Instructions Shoulder Instructions Donning/doffing sling/immobilizer: Maximal assistance;Patient able to independently direct  caregiver Correct positioning of sling/immobilizer: Maximal assistance;Patient able to independently direct caregiver ROM for elbow, wrist and digits of operated UE: Supervision/safety Sling wearing schedule (on at all times/off for ADL's): Supervision/safety Proper  positioning of operated UE when showering: Maximal assistance;Patient able to independently direct caregiver Positioning of UE while sleeping: Maximal assistance;Patient able to independently direct caregiver     General Comments      Pertinent Vitals/ Pain       Pain Assessment: 0-10 Pain Score: 9  Pain Location: L shoulder Pain Descriptors / Indicators: Aching;Grimacing;Sore Pain Intervention(s): Limited activity within patient's tolerance;Monitored during session;Repositioned;Ice applied  Home Living                                          Prior Functioning/Environment              Frequency Min 2X/week     Progress Toward Goals  OT Goals(current goals can now be found in the care plan section)  Progress towards OT goals: Progressing toward goals  Acute Rehab OT Goals Patient Stated Goal: go home OT Goal Formulation: With patient  Plan Discharge plan remains appropriate    Co-evaluation                 End of Session Equipment Utilized During Treatment: Other (comment) (sling)   Activity Tolerance Patient tolerated treatment well   Patient Left in chair;with call bell/phone within reach   Nurse Communication Other (comment) (check IV, elevated HR with mobility)        Time: 8469-6295 OT Time Calculation (min): 21 min  Charges: OT General Charges $OT Visit: 1 Procedure OT Treatments $Self Care/Home Management : 8-22 mins  Binnie Kand M.S., OTR/L Pager: [phone]  07/18/2015, 12:51 PM

## 2015-07-19 DIAGNOSIS — J42 Unspecified chronic bronchitis: Secondary | ICD-10-CM

## 2015-07-19 DIAGNOSIS — F329 Major depressive disorder, single episode, unspecified: Secondary | ICD-10-CM

## 2015-07-19 DIAGNOSIS — K219 Gastro-esophageal reflux disease without esophagitis: Secondary | ICD-10-CM

## 2015-07-19 DIAGNOSIS — Z720 Tobacco use: Secondary | ICD-10-CM | POA: Diagnosis present

## 2015-07-19 DIAGNOSIS — R000 Tachycardia, unspecified: Secondary | ICD-10-CM | POA: Insufficient documentation

## 2015-07-19 DIAGNOSIS — A419 Sepsis, unspecified organism: Secondary | ICD-10-CM | POA: Diagnosis present

## 2015-07-19 DIAGNOSIS — Z96619 Presence of unspecified artificial shoulder joint: Secondary | ICD-10-CM | POA: Insufficient documentation

## 2015-07-19 DIAGNOSIS — J449 Chronic obstructive pulmonary disease, unspecified: Secondary | ICD-10-CM | POA: Diagnosis present

## 2015-07-19 LAB — PROTIME-INR
INR: 1.17 (ref 0.00–1.49)
PROTHROMBIN TIME: 15.1 s (ref 11.6–15.2)

## 2015-07-19 LAB — CBC
HCT: 32.2 % — ABNORMAL LOW (ref 36.0–46.0)
Hemoglobin: 10.5 g/dL — ABNORMAL LOW (ref 12.0–15.0)
MCH: 27.9 pg (ref 26.0–34.0)
MCHC: 32.6 g/dL (ref 30.0–36.0)
MCV: 85.6 fL (ref 78.0–100.0)
PLATELETS: 211 10*3/uL (ref 150–400)
RBC: 3.76 MIL/uL — AB (ref 3.87–5.11)
RDW: 14.6 % (ref 11.5–15.5)
WBC: 4.6 10*3/uL (ref 4.0–10.5)

## 2015-07-19 LAB — BASIC METABOLIC PANEL
Anion gap: 9 (ref 5–15)
BUN: 9 mg/dL (ref 6–20)
CHLORIDE: 92 mmol/L — AB (ref 101–111)
CO2: 31 mmol/L (ref 22–32)
CREATININE: 0.96 mg/dL (ref 0.44–1.00)
Calcium: 8.4 mg/dL — ABNORMAL LOW (ref 8.9–10.3)
GFR calc Af Amer: >60 mL/min (ref >60)
GLUCOSE: 109 mg/dL — AB (ref 65–99)
POTASSIUM: 3.3 mmol/L — AB (ref 3.5–5.1)
Sodium: 132 mmol/L — ABNORMAL LOW (ref 135–145)

## 2015-07-19 LAB — URINALYSIS, ROUTINE W REFLEX MICROSCOPIC
BILIRUBIN URINE: NEGATIVE
Glucose, UA: NEGATIVE mg/dL
HGB URINE DIPSTICK: NEGATIVE
KETONES UR: NEGATIVE mg/dL
Leukocytes, UA: NEGATIVE
NITRITE: NEGATIVE
Protein, ur: NEGATIVE mg/dL
SPECIFIC GRAVITY, URINE: 1.007 (ref 1.005–1.030)
Urobilinogen, UA: 0.2 mg/dL (ref 0.0–1.0)
pH: 5.5 (ref 5.0–8.0)

## 2015-07-19 LAB — VALPROIC ACID LEVEL: Valproic Acid Lvl: 72 ug/mL (ref 50.0–100.0)

## 2015-07-19 LAB — LACTIC ACID, PLASMA
Lactic Acid, Venous: 1.8 mmol/L (ref 0.5–2.0)
Lactic Acid, Venous: 2.9 mmol/L (ref 0.5–2.0)

## 2015-07-19 LAB — PROCALCITONIN: PROCALCITONIN: 0.2 ng/mL

## 2015-07-19 LAB — APTT: APTT: 35 s (ref 24–37)

## 2015-07-19 LAB — MAGNESIUM: MAGNESIUM: 1.7 mg/dL (ref 1.7–2.4)

## 2015-07-19 MED ORDER — PIPERACILLIN-TAZOBACTAM 3.375 G IVPB
3.3750 g | Freq: Three times a day (TID) | INTRAVENOUS | Status: DC
  Administered 2015-07-19 – 2015-07-20 (×4): 3.375 g via INTRAVENOUS
  Filled 2015-07-19 (×7): qty 50

## 2015-07-19 MED ORDER — MAGNESIUM SULFATE IN D5W 10-5 MG/ML-% IV SOLN
1.0000 g | Freq: Once | INTRAVENOUS | Status: AC
  Administered 2015-07-19: 1 g via INTRAVENOUS
  Filled 2015-07-19 (×2): qty 100

## 2015-07-19 MED ORDER — LEVALBUTEROL HCL 1.25 MG/0.5ML IN NEBU
1.2500 mg | INHALATION_SOLUTION | Freq: Four times a day (QID) | RESPIRATORY_TRACT | Status: DC
  Administered 2015-07-19 – 2015-07-21 (×7): 1.25 mg via RESPIRATORY_TRACT
  Filled 2015-07-19 (×15): qty 0.5

## 2015-07-19 MED ORDER — ALPRAZOLAM 0.5 MG PO TABS: 1.0000 mg | ORAL_TABLET | Freq: Three times a day (TID) | ORAL | Status: DC | PRN

## 2015-07-19 MED ORDER — SODIUM CHLORIDE 0.9 % IV SOLN
INTRAVENOUS | Status: DC
  Administered 2015-07-19: 20:00:00 via INTRAVENOUS
  Administered 2015-07-19 (×2): 999 mL/h via INTRAVENOUS

## 2015-07-19 MED ORDER — POTASSIUM CHLORIDE 20 MEQ/15ML (10%) PO SOLN
20.0000 meq | Freq: Once | ORAL | Status: AC
  Administered 2015-07-19: 20 meq via ORAL
  Filled 2015-07-19 (×2): qty 15

## 2015-07-19 MED ORDER — OXYCODONE HCL 5 MG PO TABS: 5.0000 mg | ORAL_TABLET | ORAL | Status: DC | PRN

## 2015-07-19 MED ORDER — HYDROMORPHONE HCL 2 MG PO TABS: 1.0000 mg | ORAL_TABLET | Freq: Four times a day (QID) | ORAL | Status: DC | PRN

## 2015-07-19 MED ORDER — SODIUM CHLORIDE 0.9 % IV SOLN
1250.0000 mg | Freq: Two times a day (BID) | INTRAVENOUS | Status: DC
  Administered 2015-07-19 – 2015-07-20 (×2): 1250 mg via INTRAVENOUS
  Filled 2015-07-19 (×3): qty 1250

## 2015-07-19 MED ORDER — HYDRALAZINE HCL 20 MG/ML IJ SOLN: 5.0000 mg | INTRAMUSCULAR | Status: DC | PRN

## 2015-07-19 MED ORDER — HYDROMORPHONE HCL 2 MG PO TABS: 2.0000 mg | ORAL_TABLET | Freq: Four times a day (QID) | ORAL | Status: DC | PRN

## 2015-07-19 MED ORDER — SODIUM CHLORIDE 0.9 % IV BOLUS (SEPSIS)
2000.0000 mL | Freq: Once | INTRAVENOUS | Status: AC
Start: 2015-07-19 — End: 2015-07-19
  Administered 2015-07-19: 1000 mL via INTRAVENOUS

## 2015-07-19 MED ORDER — PANTOPRAZOLE SODIUM 40 MG IV SOLR
40.0000 mg | INTRAVENOUS | Status: DC
  Administered 2015-07-19: 40 mg via INTRAVENOUS
  Filled 2015-07-19: qty 40

## 2015-07-19 MED ORDER — VANCOMYCIN HCL 10 G IV SOLR
2000.0000 mg | Freq: Once | INTRAVENOUS | Status: AC
  Administered 2015-07-19: 2000 mg via INTRAVENOUS
  Filled 2015-07-19: qty 2000

## 2015-07-19 MED ORDER — SODIUM CHLORIDE 0.9 % IV BOLUS (SEPSIS)
1000.0000 mL | Freq: Once | INTRAVENOUS | Status: AC
  Administered 2015-07-19: 1000 mL via INTRAVENOUS

## 2015-07-19 MED ORDER — HYDROXYZINE HCL 50 MG/ML IM SOLN
25.0000 mg | Freq: Four times a day (QID) | INTRAMUSCULAR | Status: DC | PRN
  Filled 2015-07-19: qty 0.5

## 2015-07-19 MED ORDER — DM-GUAIFENESIN ER 30-600 MG PO TB12
1.0000 | ORAL_TABLET | Freq: Two times a day (BID) | ORAL | Status: DC
  Administered 2015-07-19 – 2015-07-21 (×5): 1 via ORAL
  Filled 2015-07-19 (×6): qty 1

## 2015-07-19 MED ORDER — PANTOPRAZOLE SODIUM 40 MG PO TBEC
40.0000 mg | DELAYED_RELEASE_TABLET | Freq: Every day | ORAL | Status: DC
  Administered 2015-07-20 – 2015-07-21 (×2): 40 mg via ORAL
  Filled 2015-07-19 (×2): qty 1

## 2015-07-19 NOTE — Progress Notes (Signed)
Occupational Therapy Treatment Patient Details Name: [name] MRN: [id number] DOB: 10/10/1966 Today's Date: 07/19/2015    History of present illness 49 y.o. s/p Lt TSA.   OT comments  Pt making progress toward OT goals. Pt participated in UB/LB bathing and dressing with min A overall. Pt able to verbalize UB dressing and bathing technique, sling management and wear schedule. Pt completed AROM at hand and wrist; AROM 0-60, PROM 0-100 at elbow. Resting HR = 116, high 120s during transfer from chair to bed. Will continue to follow pt acutely.    Follow Up Recommendations  No OT follow up;Supervision/Assistance - 24 hour    Equipment Recommendations  None recommended by OT    Recommendations for Other Services      Precautions / Restrictions Precautions Precautions: Shoulder Type of Shoulder Precautions: Conservative Protocol: No shoulder ROM, okay for AROM elbow, wrist, hand; no pushing, pulling, lifting with LUE (can use to hold light items) Shoulder Interventions: Shoulder sling/immobilizer;Off for dressing/bathing/exercises;At all times Precaution Comments: Reviewed precautions Required Braces or Orthoses: Sling Restrictions Weight Bearing Restrictions: Yes LUE Weight Bearing: Non weight bearing       Mobility Bed Mobility Overal bed mobility: Needs Assistance Bed Mobility: Sit to Supine     Supine to sit: Min assist (Min assist to scoot up in bed)        Transfers Overall transfer level: Needs assistance Equipment used: None Transfers: Sit to/from Stand Sit to Stand: Supervision         General transfer comment: Supervision for safety. Sit <> stand from chair x 2    Balance Overall balance assessment: Needs assistance         Standing balance support: No upper extremity supported Standing balance-Leahy Scale: Fair                     ADL Overall ADL's : Needs assistance/impaired     Grooming: Wash/dry face;Set  up;Supervision/safety;Sitting   Upper Body Bathing: Minimal assitance;Sitting Upper Body Bathing Details (indicate cue type and reason): Min assit for bathing under LUE, pt able to verbalize technique but required assist to get washcloth under arm Lower Body Bathing: Minimal assistance;Sit to/from stand Lower Body Bathing Details (indicate cue type and reason): Min assist to wash feet Upper Body Dressing : Minimal assistance;Sitting Upper Body Dressing Details (indicate cue type and reason): Min A to don/doff hospital gown Lower Body Dressing: Minimal assistance Lower Body Dressing Details (indicate cue type and reason): Able to doff socks independently, min A to don socks             Functional mobility during ADLs: Supervision/safety General ADL Comments: Pts daughter present at end of session. Educated pt on UB bathing/dressing technique; pt verbalized understanding and recalled from teaching yesterday. HR =116 at rest, high 120s during sit <> stand       Vision                     Perception     Praxis      Cognition   Behavior During Therapy: Impulsive (Pt noted getting out of bed on her own) Overall Cognitive Status: Within Functional Limits for tasks assessed                       Extremity/Trunk Assessment               Exercises Shoulder Exercises Elbow Flexion: PROM;AROM;Left;10 reps;Seated (AROM=0-60, PROM=0-100) Wrist Flexion: AROM;Left;10  reps;Seated Digit Composite Flexion: AROM;Left;10 reps;Seated Donning/doffing shirt without moving shoulder: Minimal assistance;Patient able to independently direct caregiver Method for sponge bathing under operated UE: Minimal assistance;Patient able to independently direct caregiver Donning/doffing sling/immobilizer: Moderate assistance;Patient able to independently direct caregiver Correct positioning of sling/immobilizer: Moderate assistance;Caregiver independent with task ROM for elbow, wrist and  digits of operated UE: Minimal assistance (assist for elbow) Sling wearing schedule (on at all times/off for ADL's): Supervision/safety;Patient able to independently direct caregiver   Shoulder Instructions Shoulder Instructions Donning/doffing shirt without moving shoulder: Minimal assistance;Patient able to independently direct caregiver Method for sponge bathing under operated UE: Minimal assistance;Patient able to independently direct caregiver Donning/doffing sling/immobilizer: Moderate assistance;Patient able to independently direct caregiver Correct positioning of sling/immobilizer: Moderate assistance;Caregiver independent with task ROM for elbow, wrist and digits of operated UE: Minimal assistance (assist for elbow) Sling wearing schedule (on at all times/off for ADL's): Supervision/safety;Patient able to independently direct caregiver     General Comments      Pertinent Vitals/ Pain       Pain Assessment: 0-10 Pain Score: 7  Pain Location: L shoulder Pain Descriptors / Indicators: Sore Pain Intervention(s): Limited activity within patient's tolerance;Monitored during session;Repositioned;Ice applied  Home Living                                          Prior Functioning/Environment              Frequency Min 2X/week     Progress Toward Goals  OT Goals(current goals can now be found in the care plan section)  Progress towards OT goals: Progressing toward goals  Acute Rehab OT Goals Patient Stated Goal: none stated  ADL Goals Pt Will Perform Upper Body Bathing: with set-up;with supervision;sitting Pt Will Perform Lower Body Bathing: with set-up;with supervision;sit to/from stand  Plan Discharge plan remains appropriate    Co-evaluation                 End of Session Equipment Utilized During Treatment: Other (comment) (sling)   Activity Tolerance Patient tolerated treatment well   Patient Left in bed;with call bell/phone within  reach;with bed alarm set   Nurse Communication          Time: 2010-0712 OT Time Calculation (min): 28 min  Charges: OT General Charges $OT Visit: 1 Procedure OT Treatments $Self Care/Home Management : 23-37 mins  Binnie Kand M.S., OTR/L Pager: [phone]  07/19/2015, 3:42 PM

## 2015-07-19 NOTE — Clinical Documentation Improvement (Signed)
Orthopedic  Can the diagnosis of systemic infection be further specified? Please document in Sepsis was present on admission (POA).   Sepsis - specify causative organism if known  Determine if there is Severe Sepsis (Sepsis with organ dysfunction - specify), Septic Shock if present  Identify etiology of Sepsis - Device, Implant, Graft, Infusion, Abortion  Specify organ dysfunction - Respiratory Failure, Encephalopathy, Acute Kidney Failure, Pneumonia, UTI, Other (specify), Unable to Clinically Determine}  Other  Clinically Undetermined  Document any associated diagnoses/conditions.   Supporting Information: Sepsis noted per 10/06 progress notes. (Please document if present on admission-POA).   Please exercise your independent, professional judgment when responding. A specific answer is not anticipated or expected.   Thank You,  [place] [phone]

## 2015-07-19 NOTE — Progress Notes (Addendum)
Call received at [facility] per floor RN regarding Pt with high fever and irregular EKG. EKG reviewed with ST changes and wide QT. RN advised over the phone to call Dr. Mardelle Matte back and update on Pt status and EKG and CXR results. Pt seen at 0200, found resting in bed, lethargic but awakens easily to voice. Oriented to name only at this time, follows commands. Rectal temp obtained at 0200 yielding 104.2. BP 109/51, HR 130s ST, RR 12, Po2 97 on RA. Lungs diminished throughout bilateral upper and lower lobes. Left shoulder hot and mildly swollen. Cooling measure initialed. Lactic acid ordered per RRT protocol for possible sepsis. Dr.Landau updated via phone per floor RN prior to my arrival. Medical consult placed per Dr. Mardelle Matte .Orders placed per Dr. Blaine Hamper in epic for fluid bolus, blood Cx X 2 and additional labs, drawn per Lab Tech. Fluids started and second PIV obtained.  Dr. Blaine Hamper at bedside 0300 to assess Patient. Pt more alert, oriented X 4 denies pain.  Rectal temp rechecked yielding 102.8 and HR improving 120s. RN advised to monitor Pt closely and notify myself and provider for worsening changes.

## 2015-07-19 NOTE — Evaluation (Signed)
Physical Therapy Evaluation Patient Details Name: [name] MRN: [id number] DOB: 09/27/1966 Today's Date: 07/19/2015   History of Present Illness  Pt is a 49 y/o F s/p Lt TSA. Pt's PMH includes fibromyalgia, bipolar disorder, spinal stenosis, HTN, obesity, anxiety, COPD, cardiovascular stress test, Transthoracic echocardiogram.  Clinical Impression  Patient is s/p above surgery resulting in functional limitations due to the deficits listed below (see PT Problem List). Will complete gait training w/ SPC next session as pt will benefit from use of SPC at home for safety. HR up to 126 during ambulation but 110-120's prior to and after ambulation. Patient will benefit from skilled PT to increase their independence and safety with mobility to allow discharge to the venue listed below.      Follow Up Recommendations Outpatient PT;Other (comment) (OPPT when appropriate to address balance deficits)    Equipment Recommendations  Cane    Recommendations for Other Services       Precautions / Restrictions Precautions Precautions: Shoulder Type of Shoulder Precautions: Conservative Protocol: No shoulder ROM, okay for AROM elbow, wrist, hand; no pushing, pulling, lifting with LUE (can use to hold light items) Shoulder Interventions: Shoulder sling/immobilizer;Off for dressing/bathing/exercises;At all times Precaution Booklet Issued: No Precaution Comments: Reviewed precautions Required Braces or Orthoses: Sling Restrictions Weight Bearing Restrictions: Yes LUE Weight Bearing: Non weight bearing      Mobility  Bed Mobility Overal bed mobility: Needs Assistance Bed Mobility: Sit to Supine;Supine to Sit     Supine to sit: Supervision Sit to supine: Supervision   General bed mobility comments: Increased time and cues for technique to scoot toward HOB by performing bridge w/ Bil LEs  Transfers Overall transfer level: Needs assistance Equipment used: None Transfers: Sit to/from  Stand Sit to Stand: Min guard         General transfer comment: Min guard as pt demonstrates mild instability upon standing.    Ambulation/Gait Ambulation/Gait assistance: Min assist Ambulation Distance (Feet): 80 Feet Assistive device: 1 person hand held assist;None Gait Pattern/deviations: Step-through pattern;Decreased stride length;Antalgic   Gait velocity interpretation: Below normal speed for age/gender General Gait Details: Inc weight shift Bil w/ instability noted.  Therefore, HHA provided.  Pt requested to turn around after ambulating 40 ft 2/2 generalized fatigue and pain in Lt knee (pre-existing).  Stairs            Wheelchair Mobility    Modified Rankin (Stroke Patients Only)       Balance Overall balance assessment: Needs assistance Sitting-balance support: No upper extremity supported;Feet supported Sitting balance-Leahy Scale: Good     Standing balance support: Single extremity supported;During functional activity Standing balance-Leahy Scale: Fair                               Pertinent Vitals/Pain Pain Assessment: 0-10 Pain Score: 6  Pain Location: Lt shoulder and Lt knee w/ ambulation Pain Descriptors / Indicators: Aching;Sore Pain Intervention(s): Limited activity within patient's tolerance;Monitored during session;Repositioned;Ice applied    Home Living Family/patient expects to be discharged to:: Private residence Living Arrangements: Other relatives Available Help at Discharge: Family;Available PRN/intermittently Type of Home: House Home Access: Stairs to enter Entrance Stairs-Rails: Psychiatric nurse of Steps: 1 Home Layout: One level Home Equipment: None      Prior Function Level of Independence: Independent               Hand Dominance   Dominant Hand: Right  Extremity/Trunk Assessment   Upper Extremity Assessment: Defer to OT evaluation           Lower Extremity Assessment:  Generalized weakness (Grossly 5/5 Bil LE but functionally dec endurance )         Communication   Communication: No difficulties  Cognition Arousal/Alertness: Awake/alert Behavior During Therapy: WFL for tasks assessed/performed Overall Cognitive Status: Within Functional Limits for tasks assessed                      General Comments General comments (skin integrity, edema, etc.): Will complete gait training w/ SPC next session as pt will benefit from use of SPC at home for safety. HR up to 126 during ambulation but 110-120's prior to and after ambulation.    Exercises Shoulder Exercises Elbow Flexion: PROM;AROM;Left;10 reps;Seated (AROM=0-60, PROM=0-100) Wrist Flexion: AROM;Left;10 reps;Seated Digit Composite Flexion: AROM;Left;10 reps;Seated Donning/doffing shirt without moving shoulder: Minimal assistance;Patient able to independently direct caregiver Method for sponge bathing under operated UE: Minimal assistance;Patient able to independently direct caregiver Donning/doffing sling/immobilizer: Moderate assistance;Patient able to independently direct caregiver Correct positioning of sling/immobilizer: Moderate assistance;Caregiver independent with task ROM for elbow, wrist and digits of operated UE: Minimal assistance (assist for elbow) Sling wearing schedule (on at all times/off for ADL's): Supervision/safety;Patient able to independently direct caregiver      Assessment/Plan    PT Assessment Patient needs continued PT services  PT Diagnosis Difficulty walking;Abnormality of gait;Generalized weakness;Acute pain   PT Problem List Decreased strength;Decreased range of motion;Decreased activity tolerance;Decreased balance;Decreased mobility;Decreased knowledge of use of DME;Decreased safety awareness;Decreased knowledge of precautions;Obesity;Decreased skin integrity;Pain  PT Treatment Interventions DME instruction;Gait training;Stair training;Functional mobility  training;Therapeutic activities;Therapeutic exercise;Balance training;Neuromuscular re-education;Patient/family education;Modalities   PT Goals (Current goals can be found in the Care Plan section) Acute Rehab PT Goals Patient Stated Goal: to go home tomorrow PT Goal Formulation: With patient Time For Goal Achievement: 07/26/15 Potential to Achieve Goals: Good    Frequency Min 4X/week   Barriers to discharge Inaccessible home environment 1 step to enter home    Co-evaluation               End of Session Equipment Utilized During Treatment: Other (comment) (sling) Activity Tolerance: Patient limited by fatigue;Patient limited by pain Patient left: in bed;with call bell/phone within reach Nurse Communication: Mobility status;Precautions         Time: 8366-2947 PT Time Calculation (min) (ACUTE ONLY): 19 min   Charges:   PT Evaluation $Initial PT Evaluation Tier I: 1 Procedure     PT G CodesJoslyn Hy PT, DPT [phone] Pager: [phone] 07/19/2015, 4:47 PM

## 2015-07-19 NOTE — Consult Note (Addendum)
[facility] History and Physical Consultation note  [name] HKV:[id number] DOB: 1965-11-19 DOA: 07/17/2015  Referring physician: ED physician PCP: Vickii Penna., MD  Specialists:   Brief description of present medical history:  49 year old lady with past medical history of severe left shoulder OA, hypertension, GERD, anxiety, bipolar disorder, fibromyalgia, spinal cord stenosis, obesity, COPD, tobacco abuse, drug abuse, who was admitted to ortho team for L shoulder surgery by Dr. Mardelle Matte on10/4/16. She is s/p of left shoulder surgery on 07/17/15. Patient developed fever on the second day after surgery. Her temperature is up to 102.9. Patient has chronic cough due to COPD, which is the baseline. She denies chest pain, shortness of breath, symptoms of UTI, abdominal pain, diarrhea, unilateral weakness. She had WBC 7.9, tachycardia with heart rate up to 140. Chest x-ray showed possible infiltrate in the left lung. We are asked to consult on this patient.  Where does patient live?   At home    Can patient participate in ADLs?  Yes   Review of Systems:   General: has fevers, chills, no changes in body weight, has poor appetite, has fatigue HEENT: no blurry vision, hearing changes or sore throat Pulm: no dyspnea, has coughing, no wheezing CV: no chest pain, palpitations Abd: no nausea, vomiting, abdominal pain, diarrhea, constipation GU: no dysuria, burning on urination, increased urinary frequency, hematuria  Ext: no leg edema Neuro: no unilateral weakness, numbness, or tingling, no vision change or hearing loss. Has pain over left shoulder. Skin: no rash MSK: No muscle spasm, no deformity, no limitation of range of movement in spin Heme: No easy bruising.  Travel history: No recent long distant travel.  Allergy:  Allergies  Allergen Reactions  . Abilify [Aripiprazole] Other (See Comments)    Causes body shaking and tremors  . Latuda [Lurasidone Hcl] Other (See Comments)     Causes body shaking and tremors  . Naproxen Anaphylaxis    Past Medical History  Diagnosis Date  . Fibromyalgia   . Bipolar 1 disorder ([place])   . Spinal stenosis   . Osteoarthritis   . Hypertension   . Obesity   . GERD (gastroesophageal reflux disease)   . PUD (peptic ulcer disease)   . Anxiety   . COPD (chronic obstructive pulmonary disease) ([place])   . Osteoarthritis of left shoulder 06/08/2013    Past Surgical History  Procedure Laterality Date  . Abdominal hysterectomy    . Arthroscopic knee    . Cardiovascular stress test  03/2010    Lexiscan Myoview: EF 55%, mild breast attenuation, no ischemia or scar  . Transthoracic echocardiogram  03/2010    EF 50-55%, normal LV size, no WMAs  . Tonsillectomy    . Total shoulder arthroplasty Left 07/17/2015    Procedure: TOTAL SHOULDER ARTHROPLASTY;  Surgeon: Marchia Bond, MD;  Location: [place];  Service: Orthopedics;  Laterality: Left;    Social History:  reports that she has been smoking Cigarettes.  She has been smoking about 0.50 packs per day. She has never used smokeless tobacco. She reports that she uses illicit drugs (Morphine and Oxycodone). She reports that she does not drink alcohol.  Family History:  Family History  Problem Relation Age of Onset  . Diabetes Other   . Hypertension Other   . Schizophrenia Mother   . Hypertension Mother   . Stroke Father      Prior to Admission medications   Medication Sig Start Date End Date Taking? Authorizing Provider  ALPRAZolam Duanne Moron) 1  MG tablet Take 1 mg by mouth 3 (three) times daily.  10/30/14  Yes Historical Provider, MD  amitriptyline (ELAVIL) 50 MG tablet One tablet at bedtime 04/18/15  Yes Bayard Hugger, NP  aspirin 81 MG chewable tablet Chew 81 mg by mouth at bedtime.    Yes Historical Provider, MD  bisacodyl (BISACODYL) 5 MG EC tablet Take 1 tablet (5 mg total) by mouth daily as needed for moderate constipation. 05/28/15  Yes Bayard Hugger, NP  divalproex (DEPAKOTE  ER) 500 MG 24 hr tablet Take 1,000 mg by mouth every evening.    Yes Historical Provider, MD  DULoxetine (CYMBALTA) 60 MG capsule Take 1 capsule (60 mg total) by mouth daily. 05/28/15  Yes Bayard Hugger, NP  hydrochlorothiazide (HYDRODIURIL) 12.5 MG tablet Take 12.5 mg by mouth daily.  06/19/15  Yes Historical Provider, MD  morphine (MS CONTIN) 15 MG 12 hr tablet Take 1 tablet (15 mg total) by mouth every 12 (twelve) hours. 06/25/15  Yes Bayard Hugger, NP  nitroGLYCERIN (NITROSTAT) 0.4 MG SL tablet Place 0.4 mg under the tongue every 5 (five) minutes as needed. 06/11/15  Yes Historical Provider, MD  oxyCODONE (ROXICODONE) 15 MG immediate release tablet Take 1 tablet (15 mg total) by mouth every 8 (eight) hours as needed for pain. 06/25/15  Yes Bayard Hugger, NP  baclofen (LIORESAL) 10 MG tablet Take 1 tablet (10 mg total) by mouth 3 (three) times daily as needed for muscle spasms. 07/17/15   Marchia Bond, MD  diazepam (VALIUM) 5 MG tablet Take 1 tablet (5 mg total) by mouth every 6 (six) hours as needed for anxiety. 07/17/15   Marchia Bond, MD  HYDROmorphone (DILAUDID) 2 MG tablet Take 1 tablet (2 mg total) by mouth every 4 (four) hours as needed for severe pain. 07/17/15   Marchia Bond, MD  oxyCODONE-acetaminophen (PERCOCET) 10-325 MG tablet Take 1-2 tablets by mouth every 6 (six) hours as needed for pain. MAXIMUM TOTAL ACETAMINOPHEN DOSE IS 4000 MG PER DAY 07/17/15   Marchia Bond, MD  sennosides-docusate sodium (SENOKOT-S) 8.6-50 MG tablet Take 2 tablets by mouth daily. 07/17/15   Marchia Bond, MD    Physical Exam: Filed Vitals:   07/18/15 2109 07/19/15 0105 07/19/15 0211 07/19/15 0300  BP: 114/63 120/77 109/51 140/88  Pulse: 141 140 130 123  Temp: 102.9 F (39.4 C) 101.9 F (38.8 C) 104.2 F (40.1 C) 102.8 F (39.3 C)  TempSrc: Oral Oral Rectal Rectal  Resp: 20 19 17 12   SpO2: 92% 87% 94% 93%   General: Not in acute distress HEENT:       Eyes: PERRL, EOMI, no scleral icterus.        ENT: No discharge from the ears and nose, no pharynx injection, no tonsillar enlargement.        Neck: No JVD, no bruit, no mass felt. Heme: No neck lymph node enlargement. Cardiac: S1/S2, RRR, tachycardia, No murmurs, No gallops or rubs. Pulm: No rales, wheezing, rhonchi or rubs. Abd: Soft, nondistended, nontender, no rebound pain, no organomegaly, BS present. Ext: No pitting leg edema bilaterally. 2+DP/PT pulse bilaterally. Musculoskeletal: No joint deformities, No joint redness or warmth, no limitation of ROM in spin. Has tenderess over left shoulder, with clean surgical site. Skin: No rashes.  Neuro: Alert, oriented X3, cranial nerves II-XII grossly intact, muscle strength 5/5 in all extremities, sensation to light touch intact. Psych: Patient is not psychotic, no suicidal or hemocidal ideation.  Labs on Admission:  Basic Metabolic  Panel:  Recent Labs Lab 07/13/15 1056 07/18/15 0440  NA 139 135  K 4.1 4.1  CL 103 96*  CO2 27 28  GLUCOSE 96 151*  BUN 15 9  CREATININE 0.82 0.79  CALCIUM 9.7 8.8*   Liver Function Tests: No results for input(s): AST, ALT, ALKPHOS, BILITOT, PROT, ALBUMIN in the last 168 hours. No results for input(s): LIPASE, AMYLASE in the last 168 hours. No results for input(s): AMMONIA in the last 168 hours. CBC:  Recent Labs Lab 07/13/15 1056 07/18/15 0440 07/19/15 0235  WBC 7.3 7.9 4.6  HGB 11.7* 10.4* 10.5*  HCT 36.4 32.0* 32.2*  MCV 84.5 85.8 85.6  PLT 281 258 211   Cardiac Enzymes: No results for input(s): CKTOTAL, CKMB, CKMBINDEX, TROPONINI in the last 168 hours.  BNP (last 3 results) No results for input(s): BNP in the last 8760 hours.  ProBNP (last 3 results) No results for input(s): PROBNP in the last 8760 hours.  CBG: No results for input(s): GLUCAP in the last 168 hours.  Radiological Exams on Admission: Dg Chest 2 View  07/18/2015   CLINICAL DATA:  Fever and tachycardia. Recent left shoulder arthroplasty.  EXAM: CHEST  2 VIEW   COMPARISON:  12/24/2013  FINDINGS: There is mild linear atelectasis or infiltrate in the left base, without confluent airspace consolidation. The right lung is clear. There is no pleural effusion. Pulmonary vasculature is normal. Hilar and mediastinal contours are unremarkable.  IMPRESSION: Linear basilar atelectasis or infiltrate in the left lung.   Electronically Signed   By: Andreas Newport M.D.   On: 07/18/2015 22:04   Dg Shoulder Left Port  07/17/2015   CLINICAL DATA:  Status post left shoulder prosthesis placement.  EXAM: LEFT SHOULDER - 1 VIEW  COMPARISON:  Left shoulder CT dated 07/06/2015.  FINDINGS: Interval humeral head and glenoid prostheses in satisfactory position and alignment on a single view. No fracture or dislocation seen.  IMPRESSION: Satisfactory postoperative appearance of a left shoulder prosthesis.   Electronically Signed   By: Claudie Revering M.D.   On: 07/17/2015 10:56    EKG: Independently reviewed.  Abnormal findings: QTC 581, tachycardia   Assessment/Plan Principal Problem:   Osteoarthritis of left shoulder Active Problems:   Bipolar disorder (HCC)   Anxiety state   Depression   GERD   Lumbosacral spondylosis without myelopathy   Sepsis (HCC)   Tobacco abuse   COPD (chronic obstructive pulmonary disease) (HCC)   S/P shoulder replacement   Tachycardia  Sepsis: This is likely due to PAN as evidenced by chest x-ray, aspiration vs. HCAP (patient has been admitted for less than 48 hours, therefore, not meet criteria of HCAP). Patient is septic with fever, tachypnea and tachycardia. Hemodynamically stable. Patient was very tachycardia initally, which improved after 1 L of normal saline bolus, currently heart rate is 120.   - Keep pt on the floor with tele monitor. If HR goes up consistently above 130, will transfer her to SDU and start Cardizem gtt. - IV Vancomycin and Zosyn IV  - Mucinex for cough  - Xopenex Neb prn for SOB - Urine legionella and S. pneumococcal  antigen - Follow up blood culture x2, sputum culture and respiratory - will get Procalcitonin and trend lactic acid level per sepsis protocol - IVF: 2L of NS bolus in ED, followed by 125 mL per hour of NS  Addendum: b/c of elevated lactic acid 2.9, another 1L of NS bolus was given in AM.  Osteoarthritis of left shoulder:  s/p of surgery. -managed ortho  - pain control  Depression and anxiety: Stable, no suicidal or homicidal ideations. -Continue home medications: Xanax, Cymbalta  Bipolar disorder ([facility]); - on Depakote  GERD: -Protonix  Tobacco abuse and Alcohol abuse: -Did counseling about importance of quitting smoking -Nicotine patch  COPD (chronic obstructive pulmonary disease) ([place]): No sign of COPD exacerbation. - prn Xopenex for shortness of breath - Mucinex for cough  Prolonged QTc interval: QTC 581 -switch Zofran to hydroxyzine for nausea -Elavil was on hold by ortho  HTN: - hold HCTZ due to sepsis. - IV hydroxyzine when necessary   DVT ppx: SCD  Code Status: Full code Family Communication: None at bed side.  Disposition Plan: Admit to inpatient   Date of Service 07/19/2015    Ivor Costa [facility] Pager [phone]  If 7PM-7AM, please contact night-coverage [email or web address] Password [email or web address] 07/19/2015, 3:49 AM

## 2015-07-19 NOTE — Progress Notes (Addendum)
Called regarding patient, lethargic, tachy to 140s, febrile to 102.9.  O2 sat 92% on RA.  BP 114/63 mmHg  Pulse 141  Temp(Src) 102.9 F (39.4 C) (Oral)  Resp 20  SpO2 92%   CXR with Linear basilar atelectasis or infiltrate in the left lung.  EKG with sinus tachycardia with QT prolongation and nonspecific ST changes per report.  I have reviewed her med list and DC'd the elavil, benadryl, reglan, valium, dilaudid iv, Oxy IR to reduce contribution to QT prolongation, and reduce narcotics.  She has an extremely difficult history of narcotic dependency and is managed in the pain clinic.   Will also order medical consultation and labs for the morning.  Need to focus on mobilization and IS for lungs.  Johnny Bridge, MD

## 2015-07-19 NOTE — Progress Notes (Signed)
OT Cancellation Note  Patient Details Name: [name] MRN: [id number] DOB: 11-18-1965   Cancelled Treatment:    Reason Eval/Treat Not Completed: Patient declined, no reason specified. Pt with visitors, declined to work with therapy at this time. Pt encouraged to participate and educated on health benefits associated with getting OOB, continued to decline stating that she just got situated in the bed and did not want to get up again. Pt encouraged to continue elbow, wrist, hand exercises; pt verbalized understanding. Will check back as time allows.   Binnie Kand M.S., OTR/L Pager: [phone]  07/19/2015, 1:31 PM

## 2015-07-19 NOTE — Progress Notes (Signed)
Patient ID: [name], female   DOB: 16-May-1966, 48 y.o.   MRN: [id number]     Subjective:  Patient reports pain as mild.  Patient sleeping and very lethargic.  Denies any CP or SOB.  Objective:   VITALS:   Filed Vitals:   07/19/15 1248 07/19/15 1300 07/19/15 1611 07/19/15 1642  BP:  125/64  117/70  Pulse:  124  105  Temp:  100.7 F (38.2 C)  98.9 F (37.2 C)  TempSrc:      Resp:  16  16  SpO2: 96% 97% 93% 94%    ABD soft Sensation intact distally Dorsiflexion/Plantar flexion intact Incision: dressing C/D/I and no drainage Good hand and wrist motion  Lab Results  Component Value Date   WBC 4.6 07/19/2015   HGB 10.5* 07/19/2015   HCT 32.2* 07/19/2015   MCV 85.6 07/19/2015   PLT 211 07/19/2015   BMET    Component Value Date/Time   NA 132* 07/19/2015 0235   K 3.3* 07/19/2015 0235   CL 92* 07/19/2015 0235   CO2 31 07/19/2015 0235   GLUCOSE 109* 07/19/2015 0235   BUN 9 07/19/2015 0235   CREATININE 0.96 07/19/2015 0235   CALCIUM 8.4* 07/19/2015 0235   GFRNONAA >60 07/19/2015 0235   GFRAA >60 07/19/2015 0235     Assessment/Plan: 2 Days Post-Op   Principal Problem:   Osteoarthritis of left shoulder Active Problems:   Bipolar disorder (HCC)   Anxiety state   Depression   GERD   Lumbosacral spondylosis without myelopathy   Sepsis (HCC)   Tobacco abuse   COPD (chronic obstructive pulmonary disease) (HCC)   S/P shoulder replacement   Tachycardia   Advance diet Up with therapy Continue sling at all times Continue IS for lung function Continue pain regimen per Dr Astrid Divine P 07/19/2015, 5:55 PM  Seen and agree.  Improved with dc of narcotics.  Will adjust dc meds and stop ER morphine, dc dilaudid, valium, and use only percocet 10/325 with her xanax and baclofen.    Dc home tomorrow if remains afebrile.   Marchia Bond, MD Cell [phone]

## 2015-07-19 NOTE — Progress Notes (Signed)
PT Cancellation Note  Patient Details Name: [name] MRN: [id number] DOB: 1966-08-07   Cancelled Treatment:    Reason Eval/Treat Not Completed: Patient declined, no reason specified (Pt refusing therapy at this time.  Family visiting.).  PT will continue to follow acutely and will see pt as time permits.  Thank you for this order.  Joslyn Hy PT, DPT [phone] Pager: [phone] 07/19/2015, 2:35 PM

## 2015-07-19 NOTE — Progress Notes (Signed)
ANTIBIOTIC CONSULT NOTE - INITIAL  Pharmacy Consult for vancomycin + zosyn  Indication: rule out pneumonia  Allergies  Allergen Reactions  . Abilify [Aripiprazole] Other (See Comments)    Causes body shaking and tremors  . Latuda [Lurasidone Hcl] Other (See Comments)    Causes body shaking and tremors  . Naproxen Anaphylaxis    Patient Measurements:   Adjusted Body Weight:   Vital Signs: Temp: 101.9 F (38.8 C) (10/06 0105) Temp Source: Oral (10/06 0105) BP: 120/77 mmHg (10/06 0105) Pulse Rate: 140 (10/06 0105) Intake/Output from previous day: 10/05 0701 - 10/06 0700 In: 1320 [P.O.:1320] Out: -  Intake/Output from this shift:    Labs:  Recent Labs  07/18/15 0440  WBC 7.9  HGB 10.4*  PLT 258  CREATININE 0.79   Estimated Creatinine Clearance: 109 mL/min (by C-G formula based on Cr of 0.79). No results for input(s): VANCOTROUGH, VANCOPEAK, VANCORANDOM, GENTTROUGH, GENTPEAK, GENTRANDOM, TOBRATROUGH, TOBRAPEAK, TOBRARND, AMIKACINPEAK, AMIKACINTROU, AMIKACIN in the last 72 hours.   Microbiology: Recent Results (from the past 720 hour(s))  Surgical pcr screen     Status: None   Collection Time: 07/13/15 10:56 AM  Result Value Ref Range Status   MRSA, PCR NEGATIVE NEGATIVE Final   Staphylococcus aureus NEGATIVE NEGATIVE Final    Comment:        The Xpert SA Assay (FDA approved for NASAL specimens in patients over 51 years of age), is one component of a comprehensive surveillance program.  Test performance has been validated by [facility] for patients greater than or equal to 51 year old. It is not intended to diagnose infection nor to guide or monitor treatment.     Medical History: Past Medical History  Diagnosis Date  . Fibromyalgia   . Bipolar 1 disorder ([place])   . Spinal stenosis   . Osteoarthritis   . Hypertension   . Obesity   . GERD (gastroesophageal reflux disease)   . PUD (peptic ulcer disease)   . Anxiety   . COPD (chronic obstructive  pulmonary disease) ([place])   . Osteoarthritis of left shoulder 06/08/2013    Medications:  Anti-infectives    Start     Dose/Rate Route Frequency Ordered Stop   07/19/15 1600  vancomycin (VANCOCIN) 1,250 mg in sodium chloride 0.9 % 250 mL IVPB     1,250 mg 166.7 mL/hr over 90 Minutes Intravenous Every 12 hours 07/19/15 0234     07/19/15 0330  piperacillin-tazobactam (ZOSYN) IVPB 3.375 g     3.375 g 12.5 mL/hr over 240 Minutes Intravenous Every 8 hours 07/19/15 0234     07/19/15 0330  vancomycin (VANCOCIN) 2,000 mg in sodium chloride 0.9 % 500 mL IVPB     2,000 mg 250 mL/hr over 120 Minutes Intravenous  Once 07/19/15 0234     07/17/15 1330  ceFAZolin (ANCEF) 3 g in dextrose 5 % 50 mL IVPB     3 g 160 mL/hr over 30 Minutes Intravenous Every 6 hours 07/17/15 1159 07/18/15 0243   07/17/15 0715  ceFAZolin (ANCEF) 3 g in dextrose 5 % 50 mL IVPB     3 g 160 mL/hr over 30 Minutes Intravenous To [facility] 07/16/15 1323 07/17/15 0728     Assessment: 49 yof s/p L total shoulder arthroplasty to begin vancomycin + zosyn for possible pneumonia. Tmax is 102.9 and WBC is WNL. SCr is WNL. Chest x-ray reports with atelectasis vs infiltrate.   Vanc 10/6>> Zosyn 10/6>>  Goal of Therapy:  Vancomycin trough level 15-20  mcg/ml  Plan:  - Zosyn 3.375gm IV Q8H (4 hr inf) - Vanc 2gm IV x 1 then 1250mg  IV Q12H - F/u renal fxn, C&S, clinical status and trough at [place], Rande Lawman 07/19/2015,2:35 AM

## 2015-07-20 ENCOUNTER — Ambulatory Visit (HOSPITAL_BASED_OUTPATIENT_CLINIC_OR_DEPARTMENT_OTHER): Admission: RE | Admit: 2015-07-20 | Payer: Medicare HMO | Source: Ambulatory Visit | Admitting: Orthopedic Surgery

## 2015-07-20 ENCOUNTER — Encounter (HOSPITAL_BASED_OUTPATIENT_CLINIC_OR_DEPARTMENT_OTHER): Admission: RE | Payer: Self-pay | Source: Ambulatory Visit

## 2015-07-20 DIAGNOSIS — F411 Generalized anxiety disorder: Secondary | ICD-10-CM

## 2015-07-20 DIAGNOSIS — F3170 Bipolar disorder, currently in remission, most recent episode unspecified: Secondary | ICD-10-CM

## 2015-07-20 HISTORY — DX: Chronic obstructive pulmonary disease, unspecified: J44.9

## 2015-07-20 LAB — CBC
HEMATOCRIT: 29.9 % — AB (ref 36.0–46.0)
Hemoglobin: 9.5 g/dL — ABNORMAL LOW (ref 12.0–15.0)
MCH: 27.1 pg (ref 26.0–34.0)
MCHC: 31.8 g/dL (ref 30.0–36.0)
MCV: 85.2 fL (ref 78.0–100.0)
PLATELETS: 196 10*3/uL (ref 150–400)
RBC: 3.51 MIL/uL — ABNORMAL LOW (ref 3.87–5.11)
RDW: 14.7 % (ref 11.5–15.5)
WBC: 5.1 10*3/uL (ref 4.0–10.5)

## 2015-07-20 LAB — CBC WITH DIFFERENTIAL/PLATELET
BASOS ABS: 0 10*3/uL (ref 0.0–0.1)
BASOS PCT: 0 %
EOS PCT: 2 %
Eosinophils Absolute: 0.1 10*3/uL (ref 0.0–0.7)
HCT: 29.5 % — ABNORMAL LOW (ref 36.0–46.0)
Hemoglobin: 9.6 g/dL — ABNORMAL LOW (ref 12.0–15.0)
Lymphocytes Relative: 20 %
Lymphs Abs: 1.2 10*3/uL (ref 0.7–4.0)
MCH: 27.7 pg (ref 26.0–34.0)
MCHC: 32.5 g/dL (ref 30.0–36.0)
MCV: 85 fL (ref 78.0–100.0)
MONO ABS: 0.8 10*3/uL (ref 0.1–1.0)
MONOS PCT: 13 %
Neutro Abs: 4 10*3/uL (ref 1.7–7.7)
Neutrophils Relative %: 65 %
PLATELETS: 206 10*3/uL (ref 150–400)
RBC: 3.47 MIL/uL — ABNORMAL LOW (ref 3.87–5.11)
RDW: 14.7 % (ref 11.5–15.5)
WBC: 6.1 10*3/uL (ref 4.0–10.5)

## 2015-07-20 LAB — COMPREHENSIVE METABOLIC PANEL
ALBUMIN: 2.6 g/dL — AB (ref 3.5–5.0)
ALK PHOS: 37 U/L — AB (ref 38–126)
ALT: 26 U/L (ref 14–54)
ANION GAP: 8 (ref 5–15)
AST: 56 U/L — AB (ref 15–41)
BILIRUBIN TOTAL: 0.4 mg/dL (ref 0.3–1.2)
BUN: 6 mg/dL (ref 6–20)
CALCIUM: 8.6 mg/dL — AB (ref 8.9–10.3)
CO2: 29 mmol/L (ref 22–32)
CREATININE: 0.78 mg/dL (ref 0.44–1.00)
Chloride: 97 mmol/L — ABNORMAL LOW (ref 101–111)
GFR calc Af Amer: >60 mL/min (ref >60)
GFR calc non Af Amer: >60 mL/min (ref >60)
GLUCOSE: 137 mg/dL — AB (ref 65–99)
Potassium: 3.5 mmol/L (ref 3.5–5.1)
Sodium: 134 mmol/L — ABNORMAL LOW (ref 135–145)
TOTAL PROTEIN: 5.7 g/dL — AB (ref 6.5–8.1)

## 2015-07-20 LAB — MAGNESIUM: Magnesium: 1.9 mg/dL (ref 1.7–2.4)

## 2015-07-20 LAB — LACTIC ACID, PLASMA
LACTIC ACID, VENOUS: 2.4 mmol/L — AB (ref 0.5–2.0)
Lactic Acid, Venous: 2.6 mmol/L (ref 0.5–2.0)

## 2015-07-20 LAB — BASIC METABOLIC PANEL
Anion gap: 11 (ref 5–15)
BUN: 6 mg/dL (ref 6–20)
CHLORIDE: 99 mmol/L — AB (ref 101–111)
CO2: 26 mmol/L (ref 22–32)
CREATININE: 0.81 mg/dL (ref 0.44–1.00)
Calcium: 8.3 mg/dL — ABNORMAL LOW (ref 8.9–10.3)
GFR calc Af Amer: >60 mL/min (ref >60)
GLUCOSE: 131 mg/dL — AB (ref 65–99)
Potassium: 3.3 mmol/L — ABNORMAL LOW (ref 3.5–5.1)
SODIUM: 136 mmol/L (ref 135–145)

## 2015-07-20 LAB — STREP PNEUMONIAE URINARY ANTIGEN: STREP PNEUMO URINARY ANTIGEN: NEGATIVE

## 2015-07-20 SURGERY — ARTHROPLASTY, SHOULDER, TOTAL
Anesthesia: General | Site: Shoulder | Laterality: Left

## 2015-07-20 MED ORDER — AMOXICILLIN-POT CLAVULANATE 875-125 MG PO TABS: 1.0000 | ORAL_TABLET | Freq: Two times a day (BID) | ORAL | Status: DC

## 2015-07-20 MED ORDER — BACLOFEN 10 MG PO TABS
5.0000 mg | ORAL_TABLET | Freq: Three times a day (TID) | ORAL | Status: DC | PRN
  Administered 2015-07-21: 5 mg via ORAL
  Filled 2015-07-20: qty 1

## 2015-07-20 MED ORDER — POTASSIUM CHLORIDE CRYS ER 20 MEQ PO TBCR
30.0000 meq | EXTENDED_RELEASE_TABLET | Freq: Once | ORAL | Status: AC
  Administered 2015-07-20: 30 meq via ORAL
  Filled 2015-07-20: qty 2

## 2015-07-20 MED ORDER — AMOXICILLIN-POT CLAVULANATE 875-125 MG PO TABS
1.0000 | ORAL_TABLET | Freq: Two times a day (BID) | ORAL | Status: DC
  Administered 2015-07-20 – 2015-07-21 (×3): 1 via ORAL
  Filled 2015-07-20 (×3): qty 1

## 2015-07-20 MED ORDER — ALPRAZOLAM 0.5 MG PO TABS
0.5000 mg | ORAL_TABLET | Freq: Three times a day (TID) | ORAL | Status: DC | PRN
  Administered 2015-07-20 – 2015-07-21 (×2): 0.5 mg via ORAL
  Filled 2015-07-20 (×2): qty 1

## 2015-07-20 MED ORDER — SODIUM CHLORIDE 0.9 % IV BOLUS (SEPSIS)
1000.0000 mL | Freq: Once | INTRAVENOUS | Status: AC
  Administered 2015-07-20: 1000 mL via INTRAVENOUS

## 2015-07-20 MED ORDER — SODIUM CHLORIDE 0.9 % IV BOLUS (SEPSIS): 1500.0000 mL | Freq: Once | INTRAVENOUS | Status: DC

## 2015-07-20 NOTE — Progress Notes (Addendum)
Occupational Therapy Treatment Patient Details Name: [name] MRN: [id number] DOB: 16-Jul-1966 Today's Date: 07/20/2015    History of present illness Pt is a 49 y/o F s/p Lt TSA. Pt's PMH includes fibromyalgia, bipolar disorder, spinal stenosis, HTN, obesity, anxiety, COPD, cardiovascular stress test, Transthoracic echocardiogram.   OT comments  Pt. Seen for skilled OT with focus of session on P/AROM to LUE.  Tolerating elbow flex/ext. Better this session.  Will continue to follow acutely.  Follow Up Recommendations  No OT follow up;Supervision/Assistance - 24 hour    Equipment Recommendations  None recommended by OT    Recommendations for Other Services      Precautions / Restrictions Precautions Precautions: Shoulder Type of Shoulder Precautions: Conservative Protocol: No shoulder ROM, okay for AROM elbow, wrist, hand; no pushing, pulling, lifting with LUE (can use to hold light items) Shoulder Interventions: Shoulder sling/immobilizer;Off for dressing/bathing/exercises;At all times Precaution Comments: Reviewed precautions Required Braces or Orthoses: Sling Restrictions LUE Weight Bearing: Non weight bearing       Mobility Bed Mobility                  Transfers                      Balance                                   ADL  toileting and pericare performed with S                                              Vision                     Perception     Praxis      Cognition                             Extremity/Trunk Assessment               Exercises Shoulder Exercises Elbow Flexion: PROM;AROM;Left;10 reps;Seated Elbow Extension: PROM;AROM;Seated;Left;10 reps Wrist Flexion: AROM;Left;10 reps;Seated Wrist Extension: PROM;AROM;Left;Seated;10 reps Digit Composite Flexion: AROM;Left;10 reps;Seated Donning/doffing sling/immobilizer: Moderate assistance;Patient able to  independently direct caregiver Correct positioning of sling/immobilizer: Moderate assistance;Caregiver independent with task ROM for elbow, wrist and digits of operated UE: Minimal assistance Sling wearing schedule (on at all times/off for ADL's): Supervision/safety;Patient able to independently direct caregiver   Shoulder Instructions Shoulder Instructions Donning/doffing sling/immobilizer: Moderate assistance;Patient able to independently direct caregiver Correct positioning of sling/immobilizer: Moderate assistance;Caregiver independent with task ROM for elbow, wrist and digits of operated UE: Minimal assistance Sling wearing schedule (on at all times/off for ADL's): Supervision/safety;Patient able to independently direct caregiver     General Comments      Pertinent Vitals/ Pain       Pain Assessment: No/denies pain  Home Living                                          Prior Functioning/Environment              Frequency       Progress Toward Goals  OT Goals(current goals  can now be found in the care plan section)  Progress towards OT goals: Progressing toward goals     Plan Discharge plan remains appropriate    Co-evaluation                 End of Session Equipment Utilized During Treatment: Gait belt   Activity Tolerance Patient tolerated treatment well   Patient Left in bed;with call bell/phone within reach   Nurse Communication          Time: 9509-3267 OT Time Calculation (min): 23 min  Charges: OT General Charges $OT Visit: 1 Procedure OT Treatments $Therapeutic Exercise: 23-37 mins  [name], COTA/L 07/20/2015, 9:22 AM

## 2015-07-20 NOTE — Progress Notes (Signed)
CRITICAL VALUE ALERT  Critical value received:  Lactic acid   Date of notification:  07/20/15   Time of notification:  0332  Critical value read back: yes  Nurse who received alert:  Joya Martyr,  RN  MD notified (1st page):  Dr Blaine Hamper  Time of first page:  0400  MD notified (2nd page):  Time of second page:  Responding MD:  Blaine Hamper  Time MD responded:  [phone]

## 2015-07-20 NOTE — Progress Notes (Signed)
CRITICAL VALUE ALERT  Critical value received:  Lactic acid 2.4   Date of notification:  07/20/15  Time of notification:  0640  Critical value read back: yes   Nurse who received alert:  Joya Martyr, RN  MD notified (1st page):  Dr Blaine Hamper  Time of first page:  0645  Time MD responded:  [phone]

## 2015-07-20 NOTE — Progress Notes (Signed)
Patient seen by Dr. Erlinda Hong this AM.  Have limited pain medications and other sedating meds.  Has gotten IV abx x 24 hours.  No WBC count, decreasing fever curve, off O2.  Will change to PO augmentin and observe.  Blood culture NGTD.    Limit sedating medications.   Patient says she has bad sleeping habits and sleeps during the day and is up at night.  Needs outpatient OSA study as well.  Family concerned that patient taking too much medication at home  .  Needs close PCP follow up and addressing of her poly pharmacy.  Patient is clinically improving and lactic acid decreasing  Eulogio Bear DO

## 2015-07-20 NOTE — Consult Note (Addendum)
[facility] History and Physical Progressive note  [name] WSF:[id number] DOB: Jan 06, 1966 DOA: 07/17/2015  Referring physician: ED physician PCP: Vickii Penna., MD  Specialists:   Brief description of present medical history:  49 year old lady with past medical history of severe left shoulder OA, hypertension, GERD, anxiety, bipolar disorder, fibromyalgia, spinal cord stenosis, obesity, COPD, tobacco abuse, drug abuse, who was admitted to ortho team for L shoulder surgery by Dr. Mardelle Matte on10/4/16. She is s/p of left shoulder surgery on 07/17/15. Patient developed fever on the second day after surgery. Her temperature was up to 102.9 yesterdy. We were asked to consult on this pt.  Patient was septic with fever and elevated lactic acid at 2.9 yesterday, likely due to PAN as evidenced by chest x-ray, aspiration vs. HCAP. IV vancomycin and zosyn were started yesterday.  Subjectively: Patient reports that she feels much better, has mild cough, but no shortness of breath or chest pain. her left shoulder pain is also better today.  Objectively: Temperature 100.9, WBC 6.1, electrolytes okay. Lactic acid is still elevated at 2.6.  Review of Systems:   General: has fevers, no chills no changes in body weight, has fatigue HEENT: no blurry vision, hearing changes or sore throat Pulm: no dyspnea, has coughing, no wheezing CV: no chest pain, palpitations Abd: no nausea, vomiting, abdominal pain, diarrhea, constipation GU: no dysuria, burning on urination, increased urinary frequency, hematuria  Ext: no leg edema Neuro: no unilateral weakness, numbness, or tingling, no vision change or hearing loss. Has pain over left shoulder. Skin: no rash MSK: No muscle spasm, no deformity, no limitation of range of movement in spin Heme: No easy bruising.  Travel history: No recent long distant travel.  Allergy:  Allergies  Allergen Reactions  . Abilify [Aripiprazole] Other (See Comments)   Causes body shaking and tremors  . Latuda [Lurasidone Hcl] Other (See Comments)    Causes body shaking and tremors  . Naproxen Anaphylaxis    Past Medical History  Diagnosis Date  . Fibromyalgia   . Bipolar 1 disorder ([place])   . Spinal stenosis   . Osteoarthritis   . Hypertension   . Obesity   . GERD (gastroesophageal reflux disease)   . PUD (peptic ulcer disease)   . Anxiety   . COPD (chronic obstructive pulmonary disease) ([place])   . Osteoarthritis of left shoulder 06/08/2013    Past Surgical History  Procedure Laterality Date  . Abdominal hysterectomy    . Arthroscopic knee    . Cardiovascular stress test  03/2010    Lexiscan Myoview: EF 55%, mild breast attenuation, no ischemia or scar  . Transthoracic echocardiogram  03/2010    EF 50-55%, normal LV size, no WMAs  . Tonsillectomy    . Total shoulder arthroplasty Left 07/17/2015    Procedure: TOTAL SHOULDER ARTHROPLASTY;  Surgeon: Marchia Bond, MD;  Location: [place] [place];  Service: Orthopedics;  Laterality: Left;    Social History:  reports that she has been smoking Cigarettes.  She has been smoking about 0.50 packs per day. She has never used smokeless tobacco. She reports that she uses illicit drugs (Morphine and Oxycodone). She reports that she does not drink alcohol.  Family History:  Family History  Problem Relation Age of Onset  . Diabetes Other   . Hypertension Other   . Schizophrenia Mother   . Hypertension Mother   . Stroke Father      Prior to Admission medications   Medication Sig Start Date End Date Taking?  Authorizing Provider  ALPRAZolam Duanne Moron) 1 MG tablet Take 1 mg by mouth 3 (three) times daily.  10/30/14  Yes Historical Provider, MD  amitriptyline (ELAVIL) 50 MG tablet One tablet at bedtime 04/18/15  Yes Bayard Hugger, NP  aspirin 81 MG chewable tablet Chew 81 mg by mouth at bedtime.    Yes Historical Provider, MD  bisacodyl (BISACODYL) 5 MG EC tablet Take 1 tablet (5 mg total) by mouth daily as needed for  moderate constipation. 05/28/15  Yes Bayard Hugger, NP  divalproex (DEPAKOTE ER) 500 MG 24 hr tablet Take 1,000 mg by mouth every evening.    Yes Historical Provider, MD  DULoxetine (CYMBALTA) 60 MG capsule Take 1 capsule (60 mg total) by mouth daily. 05/28/15  Yes Bayard Hugger, NP  hydrochlorothiazide (HYDRODIURIL) 12.5 MG tablet Take 12.5 mg by mouth daily.  06/19/15  Yes Historical Provider, MD  morphine (MS CONTIN) 15 MG 12 hr tablet Take 1 tablet (15 mg total) by mouth every 12 (twelve) hours. 06/25/15  Yes Bayard Hugger, NP  nitroGLYCERIN (NITROSTAT) 0.4 MG SL tablet Place 0.4 mg under the tongue every 5 (five) minutes as needed. 06/11/15  Yes Historical Provider, MD  oxyCODONE (ROXICODONE) 15 MG immediate release tablet Take 1 tablet (15 mg total) by mouth every 8 (eight) hours as needed for pain. 06/25/15  Yes Bayard Hugger, NP  baclofen (LIORESAL) 10 MG tablet Take 1 tablet (10 mg total) by mouth 3 (three) times daily as needed for muscle spasms. 07/17/15   Marchia Bond, MD  diazepam (VALIUM) 5 MG tablet Take 1 tablet (5 mg total) by mouth every 6 (six) hours as needed for anxiety. 07/17/15   Marchia Bond, MD  HYDROmorphone (DILAUDID) 2 MG tablet Take 1 tablet (2 mg total) by mouth every 4 (four) hours as needed for severe pain. 07/17/15   Marchia Bond, MD  oxyCODONE-acetaminophen (PERCOCET) 10-325 MG tablet Take 1-2 tablets by mouth every 6 (six) hours as needed for pain. MAXIMUM TOTAL ACETAMINOPHEN DOSE IS 4000 MG PER DAY 07/17/15   Marchia Bond, MD  sennosides-docusate sodium (SENOKOT-S) 8.6-50 MG tablet Take 2 tablets by mouth daily. 07/17/15   Marchia Bond, MD    Physical Exam: Filed Vitals:   07/19/15 1642 07/19/15 2022 07/20/15 0056 07/20/15 0411  BP: 117/70 119/64 125/79 115/63  Pulse: 105 90 110 104  Temp: 98.9 F (37.2 C) 99 F (37.2 C) 100.9 F (38.3 C) 99.9 F (37.7 C)  TempSrc:  Oral Oral Oral  Resp: 16 18 18 18   SpO2: 94% 97% 98% 96%   General: Not in acute  distress HEENT:       Eyes: PERRL, EOMI, no scleral icterus.       ENT: No discharge from the ears and nose, no pharynx injection, no tonsillar enlargement.        Neck: No JVD, no bruit, no mass felt. Heme: No neck lymph node enlargement. Cardiac: S1/S2, RRR, tachycardia, No murmurs, No gallops or rubs. Pulm: No rales, wheezing, rhonchi or rubs. Abd: Soft, nondistended, nontender, no rebound pain, no organomegaly, BS present. Ext: No pitting leg edema bilaterally. 2+DP/PT pulse bilaterally. Musculoskeletal: No joint deformities, No joint redness or warmth, no limitation of ROM in spin. Has tenderess over left shoulder, with clean surgical site. Skin: No rashes.  Neuro: Alert, oriented X3, cranial nerves II-XII grossly intact, muscle strength 5/5 in all extremities, sensation to light touch intact. Psych: Patient is not psychotic, no suicidal or hemocidal ideation.  Labs on Admission:  Basic Metabolic Panel:  Recent Labs Lab 07/13/15 1056 07/18/15 0440 07/19/15 0235 07/19/15 0516 07/20/15 0240  NA 139 135 132*  --  134*  K 4.1 4.1 3.3*  --  3.5  CL 103 96* 92*  --  97*  CO2 27 28 31   --  29  GLUCOSE 96 151* 109*  --  137*  BUN 15 9 9   --  6  CREATININE 0.82 0.79 0.96  --  0.78  CALCIUM 9.7 8.8* 8.4*  --  8.6*  MG  --   --   --  1.7  --    Liver Function Tests:  Recent Labs Lab 07/20/15 0240  AST 56*  ALT 26  ALKPHOS 37*  BILITOT 0.4  PROT 5.7*  ALBUMIN 2.6*   No results for input(s): LIPASE, AMYLASE in the last 168 hours. No results for input(s): AMMONIA in the last 168 hours. CBC:  Recent Labs Lab 07/13/15 1056 07/18/15 0440 07/19/15 0235 07/20/15 0240  WBC 7.3 7.9 4.6 6.1  NEUTROABS  --   --   --  4.0  HGB 11.7* 10.4* 10.5* 9.6*  HCT 36.4 32.0* 32.2* 29.5*  MCV 84.5 85.8 85.6 85.0  PLT 281 258 211 206   Cardiac Enzymes: No results for input(s): CKTOTAL, CKMB, CKMBINDEX, TROPONINI in the last 168 hours.  BNP (last 3 results) No results for  input(s): BNP in the last 8760 hours.  ProBNP (last 3 results) No results for input(s): PROBNP in the last 8760 hours.  CBG: No results for input(s): GLUCAP in the last 168 hours.  Radiological Exams on Admission: Dg Chest 2 View  07/18/2015   CLINICAL DATA:  Fever and tachycardia. Recent left shoulder arthroplasty.  EXAM: CHEST  2 VIEW  COMPARISON:  12/24/2013  FINDINGS: There is mild linear atelectasis or infiltrate in the left base, without confluent airspace consolidation. The right lung is clear. There is no pleural effusion. Pulmonary vasculature is normal. Hilar and mediastinal contours are unremarkable.  IMPRESSION: Linear basilar atelectasis or infiltrate in the left lung.   Electronically Signed   By: Andreas Newport M.D.   On: 07/18/2015 22:04    EKG: Independently reviewed.  Abnormal findings: QTC 581-->415, tachycardia, with Occasional PVC  Assessment/Plan Principal Problem:   Osteoarthritis of left shoulder Active Problems:   Bipolar disorder (HCC)   Anxiety state   Depression   GERD   Lumbosacral spondylosis without myelopathy   Sepsis (HCC)   Tobacco abuse   COPD (chronic obstructive pulmonary disease) (HCC)   S/P shoulder replacement   Tachycardia  Sepsis: This is likely due to PAN as evidenced by chest x-ray, aspiration vs. HCAP. Her lactic acid is still elevated 2.9-->2.6. She has tachycardia and fever now, but no leukocytosis. Hemodynamically stable. Clinically she is improving, she feels better today. No tachypnea anymore.  - continue IV Vancomycin and Zosyn IV  - Mucinex for cough  - Xopenex Neb prn for SOB - follow up Urine legionella and S. pneumococcal antigen - Follow up blood culture x2, sputum culture - continue to trend lactic acid level - Give 1L NS bolus now, then 100 cc/h ( she already received 3 L NS bolus since last night).  Osteoarthritis of left shoulder: s/p of surgery. -managed ortho  - pain control  Depression and anxiety: Stable,  no suicidal or homicidal ideations. -Continue home medications: Xanax, Cymbalta  Bipolar disorder ([place]); - on Depakote  GERD: -Protonix  Tobacco abuse: -Did counseling about importance  of quitting smoking -Nicotine patch  COPD (chronic obstructive pulmonary disease) ([place]): No sign of COPD exacerbation. - prn Xopenex for shortness of breath - Mucinex for cough  Prolonged QTc interval: improved. QTC 581-->415 -switched Zofran to hydroxyzine for nausea -Elavil was on hold by ortho  HTN: - hold HCTZ due to sepsis. - IV hydroxyzine when necessary   DVT ppx: SCD  Code Status: Full code Family Communication: None at bed side.  Disposition Plan: Admit to inpatient   Date of Service 07/20/2015    Ivor Costa [facility] Pager [phone]  If 7PM-7AM, please contact night-coverage [email or web address] Password [email or web address] 07/20/2015, 4:21 AM

## 2015-07-20 NOTE — Progress Notes (Signed)
     Subjective:  Patient reports pain as moderate.  Overall doing well, she wants to go home.    Objective:   VITALS:   Filed Vitals:   07/20/15 0056 07/20/15 0411 07/20/15 0842 07/20/15 1413  BP: 125/79 115/63  114/73  Pulse: 110 104  98  Temp: 100.9 F (38.3 C) 99.9 F (37.7 C)  97.9 F (36.6 C)  TempSrc: Oral Oral  Oral  Resp: 18 18  18   SpO2: 98% 96% 97% 96%    Neurologically intact Sensation intact distally Dorsiflexion/Plantar flexion intact Incision: no drainage   Lab Results  Component Value Date   WBC 5.1 07/20/2015   HGB 9.5* 07/20/2015   HCT 29.9* 07/20/2015   MCV 85.2 07/20/2015   PLT 196 07/20/2015   BMET    Component Value Date/Time   NA 136 07/20/2015 0516   K 3.3* 07/20/2015 0516   CL 99* 07/20/2015 0516   CO2 26 07/20/2015 0516   GLUCOSE 131* 07/20/2015 0516   BUN 6 07/20/2015 0516   CREATININE 0.81 07/20/2015 0516   CALCIUM 8.3* 07/20/2015 0516   GFRNONAA >60 07/20/2015 0516   GFRAA >60 07/20/2015 0516     Assessment/Plan: 3 Days Post-Op   Principal Problem:   Osteoarthritis of left shoulder Active Problems:   Bipolar disorder (HCC)   Anxiety state   Depression   GERD   Lumbosacral spondylosis without myelopathy   Sepsis (HCC)   Tobacco abuse   COPD (chronic obstructive pulmonary disease) (HCC)   S/P shoulder replacement   Tachycardia   Advance diet Up with therapy Plan dc home tomorrow if stays afebrile, DW Dr. Eliseo Squires.    She did not demonstrate any clear signs of sepsis, but she did have elevated lactic acid and was febrile.    Tyaisha Cullom P 07/20/2015, 3:52 PM   Marchia Bond, MD Cell [phone]

## 2015-07-20 NOTE — Progress Notes (Signed)
   07/20/15 1548  Clinical Encounter Type  Visited With Patient  Visit Type Initial  Referral From [place] met with patient and offered support while listening to her life review. Chaplain support available as needed.   Jeri Lager, Chaplain 07/20/2015 3:49 PM

## 2015-07-20 NOTE — Care Management Important Message (Signed)
Important Message  Patient Details  Name: [name] MRN: [id number] Date of Birth: September 27, 1966   Medicare Important Message Given:  Yes-second notification given    Delorse Lek 07/20/2015, 11:26 AM

## 2015-07-20 NOTE — Progress Notes (Signed)
Physical Therapy Treatment Patient Details Name: [name] MRN: [id number] DOB: Feb 16, 1966 Today's Date: 07/20/2015    History of Present Illness Pt is a 49 y/o F s/p Lt TSA. Pt's PMH includes fibromyalgia, bipolar disorder, spinal stenosis, HTN, obesity, anxiety, COPD, cardiovascular stress test, Transthoracic echocardiogram.    PT Comments    [name] demonstrated improved stability w/ introduction of cane this session.  She is agreeable to follow up w/ OPPT at d/c to address balance impairments and Lt knee pain.  Pt will benefit from continued skilled PT services to increase functional independence and safety.   Follow Up Recommendations  Outpatient PT;Other (comment) (OPPT when appropriate to address balance deficits)     Equipment Recommendations  Cane (Pt would like to purchase her own cane)    Recommendations for Other Services       Precautions / Restrictions Precautions Precautions: Shoulder Type of Shoulder Precautions: Conservative Protocol: No shoulder ROM, okay for AROM elbow, wrist, hand; no pushing, pulling, lifting with LUE (can use to hold light items) Shoulder Interventions: Shoulder sling/immobilizer;Off for dressing/bathing/exercises;At all times Precaution Booklet Issued: No Precaution Comments: Reviewed precautions Required Braces or Orthoses: Sling Restrictions Weight Bearing Restrictions: Yes LUE Weight Bearing: Non weight bearing    Mobility  Bed Mobility Overal bed mobility: Modified Independent Bed Mobility: Supine to Sit     Supine to sit: Modified independent (Device/Increase time)     General bed mobility comments: Increased time, pt w/ good technique.  Transfers Overall transfer level: Needs assistance Equipment used: None Transfers: Sit to/from Stand Sit to Stand: Min guard         General transfer comment: Min guard as pt demonstrates mild instability upon standing.    Ambulation/Gait Ambulation/Gait assistance: Min  guard;Supervision Ambulation Distance (Feet): 100 Feet Assistive device: Straight cane Gait Pattern/deviations: Step-through pattern;Decreased stride length;Antalgic   Gait velocity interpretation: Below normal speed for age/gender General Gait Details: Inc lateral sway w/ improved stability noted w/ use of SPC.  Therefore, HHA provided.  Pt requested to turn around after ambulating 50 ft 2/2 generalized fatigue and pain in Lt knee (pre-existing).   Stairs            Wheelchair Mobility    Modified Rankin (Stroke Patients Only)       Balance Overall balance assessment: Needs assistance Sitting-balance support: Single extremity supported;Feet supported Sitting balance-Leahy Scale: Good     Standing balance support: Single extremity supported;During functional activity Standing balance-Leahy Scale: Fair                      Cognition Arousal/Alertness: Awake/alert Behavior During Therapy: WFL for tasks assessed/performed Overall Cognitive Status: Within Functional Limits for tasks assessed                      Exercises Shoulder Exercises Elbow Flexion: PROM;AROM;Left;10 reps;Seated Elbow Extension: PROM;AROM;Seated;Left;10 reps Wrist Flexion: AROM;Left;10 reps;Seated Wrist Extension: PROM;AROM;Left;Seated;10 reps Digit Composite Flexion: AROM;Left;10 reps;Seated Donning/doffing sling/immobilizer: Moderate assistance;Patient able to independently direct caregiver Correct positioning of sling/immobilizer: Moderate assistance;Caregiver independent with task ROM for elbow, wrist and digits of operated UE: Minimal assistance Sling wearing schedule (on at all times/off for ADL's): Supervision/safety;Patient able to independently direct caregiver    General Comments General comments (skin integrity, edema, etc.): Pt agreeable to go to OPPT to address balance impairments and Lt knee pain.  Ice applied to shoulder and Lt knee at end of session.  Pt expressed  desire to purchase her  own cane as she wants it to be either pink or purple.      Pertinent Vitals/Pain Pain Assessment: 0-10 Pain Score: 4  Pain Location: Headache Pain Descriptors / Indicators: Headache Pain Intervention(s): Limited activity within patient's tolerance;Monitored during session;Repositioned;Ice applied    Home Living                      Prior Function            PT Goals (current goals can now be found in the care plan section) Acute Rehab PT Goals Patient Stated Goal: to go home once ready PT Goal Formulation: With patient Time For Goal Achievement: 07/26/15 Potential to Achieve Goals: Good Progress towards PT goals: Progressing toward goals    Frequency  Min 4X/week    PT Plan Current plan remains appropriate    Co-evaluation             End of Session Equipment Utilized During Treatment: Other (comment) (sling) Activity Tolerance: Patient limited by fatigue;Patient limited by pain Patient left: with call bell/phone within reach;in chair     Time: 9038-3338 PT Time Calculation (min) (ACUTE ONLY): 23 min  Charges:  $Gait Training: 8-22 mins $Therapeutic Activity: 8-22 mins                    G Codes:      [name] PT, [place] [phone] Pager: [phone] 07/20/2015, 12:52 PM

## 2015-07-21 DIAGNOSIS — M19012 Primary osteoarthritis, left shoulder: Principal | ICD-10-CM

## 2015-07-21 DIAGNOSIS — J189 Pneumonia, unspecified organism: Secondary | ICD-10-CM

## 2015-07-21 DIAGNOSIS — Z96612 Presence of left artificial shoulder joint: Secondary | ICD-10-CM

## 2015-07-21 LAB — LEGIONELLA PNEUMOPHILA SEROGP 1 UR AG: L. PNEUMOPHILA SEROGP 1 UR AG: NEGATIVE

## 2015-07-21 MED ORDER — AMOXICILLIN-POT CLAVULANATE 875-125 MG PO TABS: 1.0000 | ORAL_TABLET | Freq: Two times a day (BID) | ORAL | Status: DC

## 2015-07-21 NOTE — Progress Notes (Signed)
Occupational Therapy Treatment Patient Details Name: [name] MRN: [id number] DOB: 18-Apr-1966 Today's Date: 07/21/2015    History of present illness Pt is a 49 y/o F s/p Lt TSA. Pt's PMH includes fibromyalgia, bipolar disorder, spinal stenosis, HTN, obesity, anxiety, COPD, cardiovascular stress test, Transthoracic echocardiogram.   OT comments  Focus of session was review and completion of R UE rom and exercises.  Pt. Still requires intermittent cues to slow down and allow full elbow flexion and extension as she remains guarded with movement of RUE.  Tolerated well and able to return demo.  Eager for d/c home hoping for today.    Follow Up Recommendations  No OT follow up;Supervision/Assistance - 24 hour    Equipment Recommendations  None recommended by OT    Recommendations for Other Services      Precautions / Restrictions Precautions Precautions: Shoulder Type of Shoulder Precautions: Conservative Protocol: No shoulder ROM, okay for AROM elbow, wrist, hand; no pushing, pulling, lifting with LUE (can use to hold light items) Shoulder Interventions: Shoulder sling/immobilizer;Off for dressing/bathing/exercises;At all times Precaution Comments: Reviewed precautions Required Braces or Orthoses: Sling Restrictions LUE Weight Bearing: Non weight bearing       Mobility Bed Mobility                  Transfers                      Balance                                   ADL                                                Vision                     Perception     Praxis      Cognition   Behavior During Therapy: WFL for tasks assessed/performed Overall Cognitive Status: Within Functional Limits for tasks assessed                       Extremity/Trunk Assessment               Exercises Shoulder Exercises Elbow Flexion: PROM;AROM;Left;10 reps;Seated Elbow Extension:  PROM;AROM;Seated;Left;10 reps Wrist Flexion: AROM;Left;10 reps;Seated Wrist Extension: PROM;AROM;Left;Seated;10 reps Digit Composite Flexion: AROM;Left;10 reps;Seated Donning/doffing sling/immobilizer: Patient able to independently direct caregiver;Minimal assistance Correct positioning of sling/immobilizer: Caregiver independent with task;Minimal assistance ROM for elbow, wrist and digits of operated UE: Minimal assistance   Shoulder Instructions Shoulder Instructions Donning/doffing sling/immobilizer: Patient able to independently direct caregiver;Minimal assistance Correct positioning of sling/immobilizer: Caregiver independent with task;Minimal assistance ROM for elbow, wrist and digits of operated UE: Minimal assistance     General Comments      Pertinent Vitals/ Pain       Pain Assessment: No/denies pain  Home Living                                          Prior Functioning/Environment              Frequency Min 2X/week     Progress  Toward Goals  OT Goals(current goals can now be found in the care plan section)  Progress towards OT goals: Progressing toward goals     Plan Discharge plan remains appropriate    Co-evaluation                 End of Session     Activity Tolerance Patient tolerated treatment well   Patient Left in bed;with call bell/phone within reach   Nurse Communication          Time: 5465-0354 OT Time Calculation (min): 15 min  Charges: OT General Charges $OT Visit: 1 Procedure OT Treatments $Therapeutic Exercise: 8-22 mins  Janice Coffin, COTA/L 07/21/2015, 8:19 AM

## 2015-07-21 NOTE — Progress Notes (Signed)
Discharge instructions reviewed with patient/family. All questions answered at this time. RXs given. Transportation to home per daughter.   Daryana Whirley,RN

## 2015-07-21 NOTE — Discharge Summary (Signed)
Physician Discharge Summary  Patient ID: [name] MRN: [id number] DOB/AGE: 11/15/1965 49 y.o.  Admit date: 07/17/2015 Discharge date: 07/21/2015  Admission Diagnoses:  Osteoarthritis of left shoulder  Discharge Diagnoses:  Principal Problem:   Osteoarthritis of left shoulder Active Problems:   Bipolar disorder ([place])   Anxiety state   Depression   GERD   Lumbosacral spondylosis without myelopathy   Atelectasis vs. pneumonia   Tobacco abuse   COPD (chronic obstructive pulmonary disease) (HCC)   S/P shoulder replacement   Tachycardia   Past Medical History  Diagnosis Date  . Fibromyalgia   . Bipolar 1 disorder ([place])   . Spinal stenosis   . Osteoarthritis   . Hypertension   . Obesity   . GERD (gastroesophageal reflux disease)   . PUD (peptic ulcer disease)   . Anxiety   . COPD (chronic obstructive pulmonary disease) ([place])   . Osteoarthritis of left shoulder 06/08/2013    Surgeries: Procedure(s): TOTAL SHOULDER ARTHROPLASTY on 07/17/2015   Consultants (if any):   TRH  Discharged Condition: Improved  Hospital Course: [name] is an 49 y.o. female who was admitted 07/17/2015 with a diagnosis of Osteoarthritis of left shoulder and went to the operating room on 07/17/2015 and underwent the above named procedures.    She was given perioperative antibiotics:  Anti-infectives    Start     Dose/Rate Route Frequency Ordered Stop   07/21/15 0000  amoxicillin-clavulanate (AUGMENTIN) 875-125 MG tablet     1 tablet Oral Every 12 hours 07/21/15 0759     07/20/15 1030  amoxicillin-clavulanate (AUGMENTIN) 875-125 MG per tablet 1 tablet     1 tablet Oral Every 12 hours 07/20/15 1023     07/20/15 0000  amoxicillin-clavulanate (AUGMENTIN) 875-125 MG tablet  Status:  Discontinued     1 tablet Oral Every 12 hours 07/20/15 1525 07/21/15    07/19/15 1600  vancomycin (VANCOCIN) 1,250 mg in sodium chloride 0.9 % 250 mL IVPB  Status:  Discontinued     1,250 mg 166.7 mL/hr  over 90 Minutes Intravenous Every 12 hours 07/19/15 0234 07/20/15 1023   07/19/15 0330  piperacillin-tazobactam (ZOSYN) IVPB 3.375 g  Status:  Discontinued     3.375 g 12.5 mL/hr over 240 Minutes Intravenous Every 8 hours 07/19/15 0234 07/20/15 1023   07/19/15 0330  vancomycin (VANCOCIN) 2,000 mg in sodium chloride 0.9 % 500 mL IVPB     2,000 mg 250 mL/hr over 120 Minutes Intravenous  Once 07/19/15 0234 07/19/15 0508   07/17/15 1330  ceFAZolin (ANCEF) 3 g in dextrose 5 % 50 mL IVPB     3 g 160 mL/hr over 30 Minutes Intravenous Every 6 hours 07/17/15 1159 07/18/15 0243   07/17/15 0715  ceFAZolin (ANCEF) 3 g in dextrose 5 % 50 mL IVPB     3 g 160 mL/hr over 30 Minutes Intravenous To [facility] 07/16/15 1323 07/17/15 0728    .  She was given sequential compression devices, early ambulation,  for DVT prophylaxis.  Preop she was on substantial narcotics, which were continued, and additional medications added to control post-op pain.  She developed significant lethargy, tachycardia, and fever to 104, CXR demonstrated atelectasis vs. PNA, med consult obtained, slight elevation in lactic acid, but negative blood cultures, never produced sputum, no clear evidence of sepsis.  I suspect she had narcotic induced delirium and atelectasis, and she improved after narcotics reduced, and she mobilized, and IS encouraged.  She was on IV abx empirically and  then switched to PO.  Her wound remained pristine.  I reduced her home regimen of narcotics, as the daughter has counseled me that she is often extremely lethargic at home as well.    She benefited maximally from the hospital stay and there were no complications.    Recent vital signs:  Filed Vitals:   07/21/15 0511  BP: 115/62  Pulse: 89  Temp: 98.4 F (36.9 C)  Resp: 18    Recent laboratory studies:  Lab Results  Component Value Date   HGB 9.5* 07/20/2015   HGB 9.6* 07/20/2015   HGB 10.5* 07/19/2015   Lab Results  Component Value Date    WBC 5.1 07/20/2015   PLT 196 07/20/2015   Lab Results  Component Value Date   INR 1.17 07/19/2015   Lab Results  Component Value Date   NA 136 07/20/2015   K 3.3* 07/20/2015   CL 99* 07/20/2015   CO2 26 07/20/2015   BUN 6 07/20/2015   CREATININE 0.81 07/20/2015   GLUCOSE 131* 07/20/2015    Discharge Medications:     Medication List    STOP taking these medications        amitriptyline 50 MG tablet  Commonly known as:  ELAVIL     morphine 15 MG 12 hr tablet  Commonly known as:  MS CONTIN     oxyCODONE 15 MG immediate release tablet  Commonly known as:  ROXICODONE      TAKE these medications        ALPRAZolam 1 MG tablet  Commonly known as:  XANAX  Take 1 mg by mouth 3 (three) times daily.     amoxicillin-clavulanate 875-125 MG tablet  Commonly known as:  AUGMENTIN  Take 1 tablet by mouth every 12 (twelve) hours.     aspirin 81 MG chewable tablet  Chew 81 mg by mouth at bedtime.     baclofen 10 MG tablet  Commonly known as:  LIORESAL  Take 1 tablet (10 mg total) by mouth 3 (three) times daily as needed for muscle spasms.     bisacodyl 5 MG EC tablet  Commonly known as:  bisacodyl  Take 1 tablet (5 mg total) by mouth daily as needed for moderate constipation.     divalproex 500 MG 24 hr tablet  Commonly known as:  DEPAKOTE ER  Take 1,000 mg by mouth every evening.     DULoxetine 60 MG capsule  Commonly known as:  CYMBALTA  Take 1 capsule (60 mg total) by mouth daily.     hydrochlorothiazide 12.5 MG tablet  Commonly known as:  HYDRODIURIL  Take 12.5 mg by mouth daily.     nitroGLYCERIN 0.4 MG SL tablet  Commonly known as:  NITROSTAT  Place 0.4 mg under the tongue every 5 (five) minutes as needed.     oxyCODONE-acetaminophen 10-325 MG tablet  Commonly known as:  PERCOCET  Take 1-2 tablets by mouth every 6 (six) hours as needed for pain. MAXIMUM TOTAL ACETAMINOPHEN DOSE IS 4000 MG PER DAY     sennosides-docusate sodium 8.6-50 MG tablet   Commonly known as:  SENOKOT-S  Take 2 tablets by mouth daily.        Diagnostic Studies: Dg Chest 2 View  07/18/2015   CLINICAL DATA:  Fever and tachycardia. Recent left shoulder arthroplasty.  EXAM: CHEST  2 VIEW  COMPARISON:  12/24/2013  FINDINGS: There is mild linear atelectasis or infiltrate in the left base, without confluent airspace consolidation. The right lung  is clear. There is no pleural effusion. Pulmonary vasculature is normal. Hilar and mediastinal contours are unremarkable.  IMPRESSION: Linear basilar atelectasis or infiltrate in the left lung.   Electronically Signed   By: Andreas Newport M.D.   On: 07/18/2015 22:04   Ct Shoulder Left Wo Contrast  07/06/2015   CLINICAL DATA:  Preoperative for left shoulder replacement. Left shoulder pain for 2 years.  EXAM: CT OF THE LEFT SHOULDER WITHOUT CONTRAST  TECHNIQUE: Multidetector CT imaging was performed according to the standard protocol. Multiplanar CT image reconstructions were also generated.  COMPARISON:  06/04/2015  FINDINGS: Markedly severe degenerative glenohumeral arthropathy noted with subcortical sclerosis, subcortical cyst formation, considerable thinning of the glenohumeral articular cartilage, and prominent spurring both along the glenoid rim and along the inferomedial margin of the humeral head.  There is only mild degenerative AC joint arthropathy. The acromial undersurface is type 1 (flat).  No large amount of fluid in the subacromial subdeltoid bursa is observed. No atrophy of the rotator cuff musculature.  Adjacent ribs and lung appear unremarkable.  IMPRESSION: 1. Severe degenerative glenohumeral arthropathy. Regional musculature appears intact and no large amount of fluid is evident in the subacromial subdeltoid bursa. 2. Mild degenerative AC joint arthropathy.   Electronically Signed   By: Van Clines M.D.   On: 07/06/2015 16:14   Dg Shoulder Left Port  07/17/2015   CLINICAL DATA:  Status post left shoulder  prosthesis placement.  EXAM: LEFT SHOULDER - 1 VIEW  COMPARISON:  Left shoulder CT dated 07/06/2015.  FINDINGS: Interval humeral head and glenoid prostheses in satisfactory position and alignment on a single view. No fracture or dislocation seen.  IMPRESSION: Satisfactory postoperative appearance of a left shoulder prosthesis.   Electronically Signed   By: Claudie Revering M.D.   On: 07/17/2015 10:56    Disposition: 01-Home or Self Care        Follow-up Information    Follow up with Johnny Bridge, MD. Schedule an appointment as soon as possible for a visit in 2 weeks.   Specialty:  Orthopedic Surgery   Contact information:   [place] [id number] [phone]        Signed: Johnny Bridge 07/21/2015, 12:54 PM

## 2015-07-21 NOTE — Progress Notes (Addendum)
[facility] PROGRESS NOTE  [name] OVZ:[id number] DOB: [birth year] DOA: 07/17/2015 PCP: Vickii Penna., MD  Assessment/Plan: 1. Possible healthcare associated pneumonia versus viral syndrome -[name] admitted to the orthopedic service undergoing left total shoulder arthroplasty, procedure performed on 07/17/2015. Postoperative course complicated by fevers. A chest x-ray performed on 07/18/2015 showing linear basilar atelectasis or infiltrate in the left lung. She was transitioned to Augmentin 875 one tablet by mouth twice a day on 07/20/2015. -The past 24 hours she is remained afebrile, nontoxic, nonfocal. -Blood cultures drawn on 07/19/2015 showing no growth to date -On my evaluation she reports doing well, tolerating by mouth intake, feels ready to go home today. -From a medical standpoint I think she is stable for discharge. She was given a prescription for Augmentin 875 one tablet by mouth twice a day  Code Status: Full code Family Communication: Family not present Disposition Plan: Likely be discharged home in the next 24 hours   Antibiotics:  Augmentin  HPI/Subjective: [name] is a 49 year old with a past medical history of severe left shoulder osteoarthritis, morbid obesity, bipolar disorder, ongoing tobacco abuse and chronic obstructive pulmonary disease who was admitted to the orthopedic service undergoing left total shoulder arthroplasty, procedure performed on 07/17/2015. Postoperative course was complicated by development of fever. She was initially treated with IV antibiotic therapy, transition to oral antimicrobials on 07/20/2015.  Objective: Filed Vitals:   07/21/15 0511  BP: 115/62  Pulse: 89  Temp: 98.4 F (36.9 C)  Resp: 18    Intake/Output Summary (Last 24 hours) at 07/21/15 0754 Last data filed at 07/21/15 0500  Gross per 24 hour  Intake    960 ml  Output      0 ml  Net    960 ml   There were no vitals filed for this  visit.  Exam:   General:  Patient is awake and alert, nontoxic-appearing  Cardiovascular: Regular rate and rhythm normal S1-S2 no murmurs rubs or gallops  Respiratory: Normal respiratory effort, lungs are clear to auscultation bilaterally  Abdomen: Soft nontender nondistended  Musculoskeletal: Left upper extremity in brace  Data Reviewed: Basic Metabolic Panel:  Recent Labs Lab 07/18/15 0440 07/19/15 0235 07/19/15 0516 07/20/15 0240 07/20/15 0516 07/20/15 0930  NA 135 132*  --  134* 136  --   K 4.1 3.3*  --  3.5 3.3*  --   CL 96* 92*  --  97* 99*  --   CO2 28 31  --  29 26  --   GLUCOSE 151* 109*  --  137* 131*  --   BUN 9 9  --  6 6  --   CREATININE 0.79 0.96  --  0.78 0.81  --   CALCIUM 8.8* 8.4*  --  8.6* 8.3*  --   MG  --   --  1.7  --   --  1.9   Liver Function Tests:  Recent Labs Lab 07/20/15 0240  AST 56*  ALT 26  ALKPHOS 37*  BILITOT 0.4  PROT 5.7*  ALBUMIN 2.6*   No results for input(s): LIPASE, AMYLASE in the last 168 hours. No results for input(s): AMMONIA in the last 168 hours. CBC:  Recent Labs Lab 07/18/15 0440 07/19/15 0235 07/20/15 0240 07/20/15 0516  WBC 7.9 4.6 6.1 5.1  NEUTROABS  --   --  4.0  --   HGB 10.4* 10.5* 9.6* 9.5*  HCT 32.0* 32.2* 29.5* 29.9*  MCV 85.8 85.6 85.0 85.2  PLT  258 211 206 196   Cardiac Enzymes: No results for input(s): CKTOTAL, CKMB, CKMBINDEX, TROPONINI in the last 168 hours. BNP (last 3 results) No results for input(s): BNP in the last 8760 hours.  ProBNP (last 3 results) No results for input(s): PROBNP in the last 8760 hours.  CBG: No results for input(s): GLUCAP in the last 168 hours.  Recent Results (from the past 240 hour(s))  Surgical pcr screen     Status: None   Collection Time: 07/13/15 10:56 AM  Result Value Ref Range Status   MRSA, PCR NEGATIVE NEGATIVE Final   Staphylococcus aureus NEGATIVE NEGATIVE Final    Comment:        The Xpert SA Assay (FDA approved for NASAL specimens in  patients over 51 years of age), is one component of a comprehensive surveillance program.  Test performance has been validated by [facility] for patients greater than or equal to 51 year old. It is not intended to diagnose infection nor to guide or monitor treatment.   Culture, blood (x 2)     Status: None (Preliminary result)   Collection Time: 07/19/15  2:25 AM  Result Value Ref Range Status   Specimen Description BLOOD RIGHT ANTECUBITAL  Final   Special Requests IN PEDIATRIC BOTTLE 3CC  Final   Culture NO GROWTH 1 DAY  Final   Report Status PENDING  Incomplete  Culture, blood (x 2)     Status: None (Preliminary result)   Collection Time: 07/19/15  2:35 AM  Result Value Ref Range Status   Specimen Description BLOOD RIGHT HAND  Final   Special Requests IN PEDIATRIC BOTTLE 3CC  Final   Culture NO GROWTH 1 DAY  Final   Report Status PENDING  Incomplete     Studies: No results found.  Scheduled Meds: . amoxicillin-clavulanate  1 tablet Oral Q12H  . aspirin  81 mg Oral QHS  . dextromethorphan-guaiFENesin  1 tablet Oral BID  . divalproex  1,000 mg Oral QPM  . docusate sodium  100 mg Oral BID  . DULoxetine  60 mg Oral Daily  . levalbuterol  1.25 mg Nebulization 4 times per day  . nicotine  14 mg Transdermal Daily  . pantoprazole  40 mg Oral Daily  . senna  1 tablet Oral BID   Continuous Infusions:   Principal Problem:   Osteoarthritis of left shoulder Active Problems:   Bipolar disorder (HCC)   Anxiety state   Depression   GERD   Lumbosacral spondylosis without myelopathy   Sepsis ([facility])   Tobacco abuse   COPD (chronic obstructive pulmonary disease) ([place])   S/P shoulder replacement   Tachycardia    Time spent: 15 min    Kelvin Cellar  [facility] Pager [phone]. If 7PM-7AM, please contact night-coverage at [email or web address], password [facility] 07/21/2015, 7:54 AM  LOS: 4 days

## 2015-07-23 ENCOUNTER — Ambulatory Visit: Payer: Medicare HMO | Admitting: Physical Medicine & Rehabilitation

## 2015-07-24 LAB — CULTURE, BLOOD (ROUTINE X 2)
Culture: NO GROWTH
Culture: NO GROWTH

## 2015-07-30 ENCOUNTER — Encounter: Payer: Medicare HMO | Admitting: Physical Medicine & Rehabilitation

## 2015-08-01 ENCOUNTER — Encounter: Payer: Self-pay | Admitting: Registered Nurse

## 2015-08-01 ENCOUNTER — Encounter: Payer: Medicare HMO | Attending: Physical Medicine and Rehabilitation | Admitting: Registered Nurse

## 2015-08-01 VITALS — BP 130/83 | HR 96 | Resp 16

## 2015-08-01 DIAGNOSIS — M129 Arthropathy, unspecified: Secondary | ICD-10-CM | POA: Diagnosis present

## 2015-08-01 DIAGNOSIS — Z79899 Other long term (current) drug therapy: Secondary | ICD-10-CM

## 2015-08-01 DIAGNOSIS — M19019 Primary osteoarthritis, unspecified shoulder: Secondary | ICD-10-CM | POA: Insufficient documentation

## 2015-08-01 DIAGNOSIS — Z5181 Encounter for therapeutic drug level monitoring: Secondary | ICD-10-CM

## 2015-08-01 DIAGNOSIS — M47817 Spondylosis without myelopathy or radiculopathy, lumbosacral region: Secondary | ICD-10-CM

## 2015-08-01 DIAGNOSIS — M12512 Traumatic arthropathy, left shoulder: Secondary | ICD-10-CM | POA: Diagnosis present

## 2015-08-01 DIAGNOSIS — G894 Chronic pain syndrome: Secondary | ICD-10-CM | POA: Diagnosis not present

## 2015-08-01 DIAGNOSIS — M19012 Primary osteoarthritis, left shoulder: Secondary | ICD-10-CM | POA: Diagnosis present

## 2015-08-01 DIAGNOSIS — M791 Myalgia: Secondary | ICD-10-CM | POA: Diagnosis not present

## 2015-08-01 DIAGNOSIS — M609 Myositis, unspecified: Secondary | ICD-10-CM

## 2015-08-01 DIAGNOSIS — IMO0001 Reserved for inherently not codable concepts without codable children: Secondary | ICD-10-CM

## 2015-08-01 NOTE — Progress Notes (Signed)
Subjective:    Patient ID: [name], female    DOB: [birth year], 49 y.o.   MRN: [id number]  HPI: [name] is a 49year old female who returns for follow up for chronic pain and medication refill. She says her pain is located in her lower back and bilateral knees.She rates her pain 5. Her current exercise regime is walking. Also was admitted on 07/17/2015 for Total Shoulder Arthroplasty by Dr. Mardelle Matte. She developed possible narcotic induced delirium and atelectasis. Her narcotics was reduced Morphine, Amitriptyline, and Oxycodone 15 mg was discontinued and was prescribed Augmentin and Percocet 10/325mg .  Pain Inventory Average Pain 5 Pain Right Now 5 My pain is sharp, dull and aching  In the last 24 hours, has pain interfered with the following? General activity 7 Relation with others 7 Enjoyment of life 6 What TIME of day is your pain at its worst? morning Sleep (in general) Fair  Pain is worse with: walking Pain improves with: NA Relief from Meds: 7  Mobility Do you have any goals in this area?  no  Function retired Do you have any goals in this area?  no  Neuro/Psych No problems in this area  Prior Studies Any changes since last visit?  no  Physicians involved in your care Any changes since last visit?  no   Family History  Problem Relation Age of Onset  . Diabetes Other   . Hypertension Other   . Schizophrenia Mother   . Hypertension Mother   . Stroke Father    Social History   Social History  . Marital Status: Divorced    Spouse Name: N/A  . Number of Children: N/A  . Years of Education: N/A   Social History Main Topics  . Smoking status: Current Every Day Smoker -- 0.50 packs/day    Types: Cigarettes  . Smokeless tobacco: Never Used  . Alcohol Use: No  . Drug Use: Yes    Special: Morphine, Oxycodone     Comment: goes to pain clinic  . Sexual Activity: Not Asked   Other Topics Concern  . None   Social History Narrative   Past  Surgical History  Procedure Laterality Date  . Abdominal hysterectomy    . Arthroscopic knee    . Cardiovascular stress test  03/2010    Lexiscan Myoview: EF 55%, mild breast attenuation, no ischemia or scar  . Transthoracic echocardiogram  03/2010    EF 50-55%, normal LV size, no WMAs  . Tonsillectomy    . Total shoulder arthroplasty Left 07/17/2015    Procedure: TOTAL SHOULDER ARTHROPLASTY;  Surgeon: Marchia Bond, MD;  Location: [facility];  Service: Orthopedics;  Laterality: Left;   Past Medical History  Diagnosis Date  . Fibromyalgia   . Bipolar 1 disorder ([place])   . Spinal stenosis   . Osteoarthritis   . Hypertension   . Obesity   . GERD (gastroesophageal reflux disease)   . PUD (peptic ulcer disease)   . Anxiety   . COPD (chronic obstructive pulmonary disease) ([place])   . Osteoarthritis of left shoulder 06/08/2013   BP 130/83 mmHg  Pulse 96  Resp 16  SpO2 98%  Opioid Risk Score:   Fall Risk Score:  `1  Depression screen PHQ 2/9  Depression screen [facility] 2/9 06/25/2015 05/28/2015 01/02/2015  Decreased Interest 0 1 1  Down, Depressed, Hopeless 0 1 1  PHQ - 2 Score 0 2 2  Altered sleeping - - 2  Tired, decreased energy - -  1  Change in appetite - - 1  Feeling bad or failure about yourself  - - 1  Trouble concentrating - - 0  Moving slowly or fidgety/restless - - 0  Suicidal thoughts - - 0  PHQ-9 Score - - 7   Review of Systems  All other systems reviewed and are negative.      Objective:   Physical Exam  Constitutional: She is oriented to person, place, and time. She appears well-developed and well-nourished.  HENT:  Head: Normocephalic and atraumatic.  Neck: Normal range of motion. Neck supple.  Cardiovascular: Normal rate and regular rhythm.   Pulmonary/Chest: Effort normal and breath sounds normal.  Musculoskeletal: She exhibits edema.  Normal Muscle Bulk and Muscle Testing Reveals: Upper Extremities: Right:Full ROM and Muscle Strength 5/5 Left: Decreased ROM  with splint intact and Muscle Strength 4/5 Lumbar Paraspinal Tenderness: L-4- L-5 Arises from chair with ease Narrow Based gait   Neurological: She is alert and oriented to person, place, and time.  Skin: Skin is warm and dry.  Psychiatric: She has a normal mood and affect.  Nursing note and vitals reviewed.         Assessment & Plan:  1. Fibromyalgia Syndrome: Continue with exercise and heat therapy. Encouraged to increase activity 2. Osteoarthritis of Bilateral Knees:  Orthopedist FollowingContinue with heat and exercise therapy.  3. Left Shoulder OA: S/PLeft Shoulder Arthroplasty. Orthopedist Following.  4. Bipolar Syndrome: On Depakote and Cymbalta.  5. Lumbar Spondylosis: MS Contin and Oxycodone discontinue by Dr. Mardelle Matte. Percocet 10 mg Prescribed by Dr. Mardelle Matte 6. Insomnia: No Complaints continue to monitor 7. Muscle Spasms: Continue Bacolen TID as needed  8. Constipation: Continue Bisacodyl  30 minutes of face to face patient care time was spent during this visit. All questions were encouraged and answered

## 2015-08-29 ENCOUNTER — Other Ambulatory Visit: Payer: Self-pay | Admitting: Registered Nurse

## 2015-10-01 ENCOUNTER — Encounter: Payer: Self-pay | Admitting: Registered Nurse

## 2015-10-01 ENCOUNTER — Encounter: Payer: Medicare HMO | Attending: Physical Medicine and Rehabilitation | Admitting: Registered Nurse

## 2015-10-01 VITALS — BP 123/63 | HR 96

## 2015-10-01 DIAGNOSIS — M129 Arthropathy, unspecified: Secondary | ICD-10-CM | POA: Insufficient documentation

## 2015-10-01 DIAGNOSIS — Z5181 Encounter for therapeutic drug level monitoring: Secondary | ICD-10-CM | POA: Diagnosis present

## 2015-10-01 DIAGNOSIS — M19019 Primary osteoarthritis, unspecified shoulder: Secondary | ICD-10-CM | POA: Insufficient documentation

## 2015-10-01 DIAGNOSIS — M12512 Traumatic arthropathy, left shoulder: Secondary | ICD-10-CM | POA: Insufficient documentation

## 2015-10-01 DIAGNOSIS — M174 Other bilateral secondary osteoarthritis of knee: Secondary | ICD-10-CM

## 2015-10-01 DIAGNOSIS — M609 Myositis, unspecified: Secondary | ICD-10-CM | POA: Diagnosis present

## 2015-10-01 DIAGNOSIS — M47817 Spondylosis without myelopathy or radiculopathy, lumbosacral region: Secondary | ICD-10-CM

## 2015-10-01 DIAGNOSIS — M791 Myalgia: Secondary | ICD-10-CM | POA: Diagnosis not present

## 2015-10-01 DIAGNOSIS — M19012 Primary osteoarthritis, left shoulder: Secondary | ICD-10-CM | POA: Insufficient documentation

## 2015-10-01 DIAGNOSIS — G894 Chronic pain syndrome: Secondary | ICD-10-CM

## 2015-10-01 DIAGNOSIS — IMO0001 Reserved for inherently not codable concepts without codable children: Secondary | ICD-10-CM

## 2015-10-01 DIAGNOSIS — Z79899 Other long term (current) drug therapy: Secondary | ICD-10-CM | POA: Diagnosis present

## 2015-10-01 MED ORDER — OXYCODONE HCL 15 MG PO TABS: 15.0000 mg | ORAL_TABLET | Freq: Three times a day (TID) | ORAL | Status: DC

## 2015-10-01 NOTE — Progress Notes (Signed)
Subjective:    Patient ID: [name], female    DOB: 10/20/1965, 49 y.o.   MRN: [id number]  HPI: [name] is a 49 year old female who returns for follow up for chronic pain and medication refill. She says her pain is located in her lower back and bilateral knees left greater than right.She rates her pain 7. Her current exercise regime is walking.  S/P Total Shoulder Arthroplasty by Dr. Mardelle Matte on 07/17/2015. He has released her from his care we will resume her analgesics. Also [name] will be starting a weight loss program with [facility] in January she states.  Pain Inventory Average Pain 6 Pain Right Now 7 My pain is burning, stabbing and tingling  In the last 24 hours, has pain interfered with the following? General activity 6 Relation with others 6 Enjoyment of life 6 What TIME of day is your pain at its worst? morning and night Sleep (in general) Fair  Pain is worse with: walking, sitting and standing Pain improves with: rest and medication Relief from Meds: 7  Mobility how many minutes can you walk? 10 ability to climb steps?  yes do you drive?  yes  Function retired  Neuro/Psych trouble walking spasms  Prior Studies Any changes since last visit?  no  Physicians involved in your care Any changes since last visit?  no   Family History  Problem Relation Age of Onset  . Diabetes Other   . Hypertension Other   . Schizophrenia Mother   . Hypertension Mother   . Stroke Father    Social History   Social History  . Marital Status: Divorced    Spouse Name: N/A  . Number of Children: N/A  . Years of Education: N/A   Social History Main Topics  . Smoking status: Current Every Day Smoker -- 0.50 packs/day    Types: Cigarettes  . Smokeless tobacco: Never Used  . Alcohol Use: No  . Drug Use: Yes    Special: Morphine, Oxycodone     Comment: goes to pain clinic  . Sexual Activity: Not Asked   Other Topics Concern  . None    Social History Narrative   Past Surgical History  Procedure Laterality Date  . Abdominal hysterectomy    . Arthroscopic knee    . Cardiovascular stress test  03/2010    Lexiscan Myoview: EF 55%, mild breast attenuation, no ischemia or scar  . Transthoracic echocardiogram  03/2010    EF 50-55%, normal LV size, no WMAs  . Tonsillectomy    . Total shoulder arthroplasty Left 07/17/2015    Procedure: TOTAL SHOULDER ARTHROPLASTY;  Surgeon: Marchia Bond, MD;  Location: [facility];  Service: Orthopedics;  Laterality: Left;   Past Medical History  Diagnosis Date  . Fibromyalgia   . Bipolar 1 disorder ([place])   . Spinal stenosis   . Osteoarthritis   . Hypertension   . Obesity   . GERD (gastroesophageal reflux disease)   . PUD (peptic ulcer disease)   . Anxiety   . COPD (chronic obstructive pulmonary disease) ([place])   . Osteoarthritis of left shoulder 06/08/2013   BP 151/63 mmHg  Pulse 96  SpO2 96%  Opioid Risk Score:   Fall Risk Score:  `1  Depression screen PHQ 2/9  Depression screen [facility] 2/9 06/25/2015 05/28/2015 01/02/2015  Decreased Interest 0 1 1  Down, Depressed, Hopeless 0 1 1  PHQ - 2 Score 0 2 2  Altered sleeping - - 2  Tired, decreased energy - - 1  Change in appetite - - 1  Feeling bad or failure about yourself  - - 1  Trouble concentrating - - 0  Moving slowly or fidgety/restless - - 0  Suicidal thoughts - - 0  PHQ-9 Score - - 7    Review of Systems  All other systems reviewed and are negative.      Objective:   Physical Exam  Constitutional: She is oriented to person, place, and time. She appears well-developed and well-nourished.  HENT:  Head: Normocephalic and atraumatic.  Neck: Normal range of motion. Neck supple.  Cardiovascular: Normal rate and regular rhythm.   Pulmonary/Chest: Effort normal.  Musculoskeletal:  Normal Muscle Bulk and Muscle Testing Reveals: Upper Extremities: Full ROM and Muscle Strength 5/5 Lumbar Paraspinal Tenderness: L-3-  L-5 Lower Extremities: Full ROM and Muscle Strength 5/5 Left Lower Extremity Flexion Produces Pain into Patella Arises from chair slowly Antalgic Gait  Neurological: She is alert and oriented to person, place, and time.  Skin: Skin is warm and dry.  Psychiatric: She has a normal mood and affect.  Nursing note and vitals reviewed.         Assessment & Plan:  1. Fibromyalgia Syndrome: Continue with exercise and heat therapy. Encouraged to increase activity 2. Osteoarthritis of Bilateral Knees:  Orthopedist FollowingContinue with heat and exercise therapy.  3. Left Shoulder OA: S/PLeft Shoulder Arthroplasty. Orthopedist Following. Dr. Mardelle Matte 4. Bipolar Syndrome: On Depakote and Cymbalta.  5. Lumbar Spondylosis: RX: Oxycodone 15 mg one tablet three times a day #90 6. Insomnia: No Complaints continue to monitor 7. Muscle Spasms: Continue Bacolen TID as needed  8. Constipation: Continue Bisacodyl  30 minutes of face to face patient care time was spent during this visit. All questions were encouraged and answered

## 2015-10-06 ENCOUNTER — Encounter (HOSPITAL_COMMUNITY): Payer: Self-pay

## 2015-10-06 ENCOUNTER — Emergency Department (HOSPITAL_COMMUNITY)
Admission: EM | Admit: 2015-10-06 | Discharge: 2015-10-06 | Disposition: A | Discharge status: Home / Self Care | Payer: Medicare HMO | Attending: Emergency Medicine | Admitting: Emergency Medicine

## 2015-10-06 ENCOUNTER — Emergency Department (HOSPITAL_COMMUNITY): Payer: Medicare HMO

## 2015-10-06 DIAGNOSIS — Y998 Other external cause status: Secondary | ICD-10-CM | POA: Diagnosis not present

## 2015-10-06 DIAGNOSIS — Y92002 Bathroom of unspecified non-institutional (private) residence single-family (private) house as the place of occurrence of the external cause: Secondary | ICD-10-CM | POA: Diagnosis not present

## 2015-10-06 DIAGNOSIS — E669 Obesity, unspecified: Secondary | ICD-10-CM | POA: Insufficient documentation

## 2015-10-06 DIAGNOSIS — Z79891 Long term (current) use of opiate analgesic: Secondary | ICD-10-CM | POA: Insufficient documentation

## 2015-10-06 DIAGNOSIS — Z8711 Personal history of peptic ulcer disease: Secondary | ICD-10-CM | POA: Insufficient documentation

## 2015-10-06 DIAGNOSIS — I10 Essential (primary) hypertension: Secondary | ICD-10-CM | POA: Insufficient documentation

## 2015-10-06 DIAGNOSIS — M1990 Unspecified osteoarthritis, unspecified site: Secondary | ICD-10-CM | POA: Insufficient documentation

## 2015-10-06 DIAGNOSIS — F17210 Nicotine dependence, cigarettes, uncomplicated: Secondary | ICD-10-CM | POA: Insufficient documentation

## 2015-10-06 DIAGNOSIS — J449 Chronic obstructive pulmonary disease, unspecified: Secondary | ICD-10-CM | POA: Insufficient documentation

## 2015-10-06 DIAGNOSIS — W208XXA Other cause of strike by thrown, projected or falling object, initial encounter: Secondary | ICD-10-CM | POA: Insufficient documentation

## 2015-10-06 DIAGNOSIS — Z79899 Other long term (current) drug therapy: Secondary | ICD-10-CM | POA: Diagnosis not present

## 2015-10-06 DIAGNOSIS — S80212A Abrasion, left knee, initial encounter: Secondary | ICD-10-CM | POA: Diagnosis not present

## 2015-10-06 DIAGNOSIS — R000 Tachycardia, unspecified: Secondary | ICD-10-CM | POA: Insufficient documentation

## 2015-10-06 DIAGNOSIS — Y9389 Activity, other specified: Secondary | ICD-10-CM | POA: Diagnosis not present

## 2015-10-06 DIAGNOSIS — M797 Fibromyalgia: Secondary | ICD-10-CM | POA: Diagnosis not present

## 2015-10-06 DIAGNOSIS — F319 Bipolar disorder, unspecified: Secondary | ICD-10-CM | POA: Insufficient documentation

## 2015-10-06 DIAGNOSIS — Z3202 Encounter for pregnancy test, result negative: Secondary | ICD-10-CM | POA: Diagnosis not present

## 2015-10-06 DIAGNOSIS — W19XXXA Unspecified fall, initial encounter: Secondary | ICD-10-CM

## 2015-10-06 DIAGNOSIS — Z7982 Long term (current) use of aspirin: Secondary | ICD-10-CM | POA: Insufficient documentation

## 2015-10-06 DIAGNOSIS — S0990XA Unspecified injury of head, initial encounter: Secondary | ICD-10-CM | POA: Diagnosis present

## 2015-10-06 LAB — I-STAT CHEM 8, ED
BUN: 15 mg/dL (ref 6–20)
CALCIUM ION: 1.17 mmol/L (ref 1.12–1.23)
CREATININE: 0.8 mg/dL (ref 0.44–1.00)
Chloride: 96 mmol/L — ABNORMAL LOW (ref 101–111)
GLUCOSE: 126 mg/dL — AB (ref 65–99)
HCT: 36 % (ref 36.0–46.0)
HEMOGLOBIN: 12.2 g/dL (ref 12.0–15.0)
Potassium: 3.4 mmol/L — ABNORMAL LOW (ref 3.5–5.1)
Sodium: 139 mmol/L (ref 135–145)
TCO2: 32 mmol/L (ref 0–100)

## 2015-10-06 LAB — I-STAT BETA HCG BLOOD, ED (MC, WL, AP ONLY)

## 2015-10-06 LAB — CK: CK TOTAL: 119 U/L (ref 38–234)

## 2015-10-06 MED ORDER — SODIUM CHLORIDE 0.9 % IV BOLUS (SEPSIS)
1000.0000 mL | Freq: Once | INTRAVENOUS | Status: AC
  Administered 2015-10-06: 1000 mL via INTRAVENOUS

## 2015-10-06 NOTE — Discharge Instructions (Signed)
RICE for Routine Care of Injuries Ms. Fleishman, your imaging studies today are negative. See a primary care physician within 3 days for close follow-up. Take Tylenol or ibuprofen as needed for your pain. If symptoms worsen come back to emergency department immediately. Thank you. Many injuries can be cared for using rest, ice, compression, and elevation (RICE therapy). Using RICE therapy can help to lessen pain and swelling. It can help your body to heal. Rest Reduce your normal activities and avoid using the injured part of your body. You can go back to your normal activities when you feel okay and your doctor says it is okay. Ice Do not put ice on your bare skin.  Put ice in a plastic bag.  Place a towel between your skin and the bag.  Leave the ice on for 20 minutes, 2-3 times a day. Do this for as long as told by your doctor. Compression Compression means putting pressure on the injured area. This can be done with an elastic bandage. If an elastic bandage has been applied:  Remove and reapply the bandage every 3-4 hours or as told by your doctor.  Make sure the bandage is not wrapped too tight. Wrap the bandage more loosely if part of your body beyond the bandage is blue, swollen, cold, painful, or loses feeling (numb).  See your doctor if the bandage seems to make your problems worse. Elevation Elevation means keeping the injured area raised. Raise the injured area above your heart or the center of your chest if you can. WHEN SHOULD I GET HELP? You should get help if:  You keep having pain and swelling.  Your symptoms get worse. WHEN SHOULD I GET HELP RIGHT AWAY? You should get help right away if:  You have sudden bad pain at or below the area of your injury.  You have redness or more swelling around your injury.  You have tingling or numbness at or below the injury that does not go away when you take off the bandage.   This information is not intended to replace advice  given to you by your health care provider. Make sure you discuss any questions you have with your health care provider.   Document Released: 03/17/2008 Document Revised: 06/20/2015 Document Reviewed: 09/06/2014 Elsevier Interactive Patient Education [employer].

## 2015-10-06 NOTE — ED Provider Notes (Addendum)
CSN: [id number]     Arrival date & time 10/06/15  [id number] History  By signing my name below, I, Evelene Croon, attest that this documentation has been prepared under the direction and in the presence of Everlene Balls, MD . Electronically Signed: Evelene Croon, Scribe. 10/06/2015. 4:15 AM.    Chief Complaint  Patient presents with  . Head Injury    The history is provided by the patient. No language interpreter was used.    HPI Comments:  [name] is a 49 y.o. female who presents to the Emergency Department complaining of mild HA s/p head injury this AM. Pt states an overhead shelf fell on top of her while she was using the bathroom. Daughter states she was trapped for ~ 1 hour. Pt denies LOC. She reports associated left knee pain. She denies use of blood thinners. No alleviating factors noted.    Past Medical History  Diagnosis Date  . Fibromyalgia   . Bipolar 1 disorder ([place])   . Spinal stenosis   . Osteoarthritis   . Hypertension   . Obesity   . GERD (gastroesophageal reflux disease)   . PUD (peptic ulcer disease)   . Anxiety   . COPD (chronic obstructive pulmonary disease) ([place])   . Osteoarthritis of left shoulder 06/08/2013   Past Surgical History  Procedure Laterality Date  . Abdominal hysterectomy    . Arthroscopic knee    . Cardiovascular stress test  03/2010    Lexiscan Myoview: EF 55%, mild breast attenuation, no ischemia or scar  . Transthoracic echocardiogram  03/2010    EF 50-55%, normal LV size, no WMAs  . Tonsillectomy    . Total shoulder arthroplasty Left 07/17/2015    Procedure: TOTAL SHOULDER ARTHROPLASTY;  Surgeon: Marchia Bond, MD;  Location: [facility];  Service: Orthopedics;  Laterality: Left;   Family History  Problem Relation Age of Onset  . Diabetes Other   . Hypertension Other   . Schizophrenia Mother   . Hypertension Mother   . Stroke Father    Social History  Substance Use Topics  . Smoking status: Current Every Day Smoker -- 0.50 packs/day     Types: Cigarettes  . Smokeless tobacco: Never Used  . Alcohol Use: No   OB History    No data available     Review of Systems  10 systems reviewed and all are negative for acute change except as noted in the HPI.  Allergies  Abilify; Latuda; Naproxen; and Wellbutrin  Home Medications   Prior to Admission medications   Medication Sig Start Date End Date Taking? Authorizing Provider  ALPRAZolam Duanne Moron) 1 MG tablet Take 1 mg by mouth 3 (three) times daily.  10/30/14   Historical Provider, MD  aspirin 81 MG chewable tablet Chew 81 mg by mouth at bedtime.     Historical Provider, MD  baclofen (LIORESAL) 10 MG tablet Take 1 tablet (10 mg total) by mouth 3 (three) times daily as needed for muscle spasms. 07/17/15   Marchia Bond, MD  bisacodyl (BISACODYL) 5 MG EC tablet Take 1 tablet (5 mg total) by mouth daily as needed for moderate constipation. 05/28/15   Bayard Hugger, NP  divalproex (DEPAKOTE ER) 500 MG 24 hr tablet Take 1,000 mg by mouth every evening.     Historical Provider, MD  DULoxetine (CYMBALTA) 60 MG capsule TAKE ONE CAPSULE BY MOUTH DAILY 08/29/15   Bayard Hugger, NP  hydrochlorothiazide (HYDRODIURIL) 12.5 MG tablet Take 12.5 mg by mouth daily.  06/19/15   Historical Provider, MD  nitroGLYCERIN (NITROSTAT) 0.4 MG SL tablet Place 0.4 mg under the tongue every 5 (five) minutes as needed. 06/11/15   Historical Provider, MD  oxyCODONE (ROXICODONE) 15 MG immediate release tablet Take 1 tablet (15 mg total) by mouth 3 (three) times daily. 10/01/15   Bayard Hugger, NP  sennosides-docusate sodium (SENOKOT-S) 8.6-50 MG tablet Take 2 tablets by mouth daily. 07/17/15   Marchia Bond, MD   BP 156/95 mmHg  Pulse 111  Temp(Src) 98.6 F (37 C) (Oral)  Resp 18  Ht 5\' 2"  (1.575 m)  Wt 290 lb (131.543 kg)  BMI 53.03 kg/m2  SpO2 93% Physical Exam  Constitutional: She is oriented to person, place, and time. She appears well-developed and well-nourished. No distress.  Obese  HENT:   Head: Normocephalic and atraumatic.  Nose: Nose normal.  Mouth/Throat: Oropharynx is clear and moist. No oropharyngeal exudate.  Eyes: Conjunctivae and EOM are normal. Pupils are equal, round, and reactive to light. No scleral icterus.  Neck: Normal range of motion. Neck supple. No JVD present. No tracheal deviation present. No thyromegaly present.  Cardiovascular: Regular rhythm and normal heart sounds.  Exam reveals no gallop and no friction rub.   No murmur heard. tachycardia  Pulmonary/Chest: Effort normal and breath sounds normal. No respiratory distress. She has no wheezes. She exhibits no tenderness.  Abdominal: Soft. Bowel sounds are normal. She exhibits no distension and no mass. There is no tenderness. There is no rebound and no guarding.  Musculoskeletal: Normal range of motion. She exhibits no edema.  Abrasion to left knee TTP with swollen joint No warmth or tenderness  Lymphadenopathy:    She has no cervical adenopathy.  Neurological: She is alert and oriented to person, place, and time. No cranial nerve deficit. She exhibits normal muscle tone.  Drowsy but easily arousable.  Normal strength and sensation to all extremities; Normal cerebellar testing  Skin: Skin is warm and dry. No rash noted. No pallor.  Nursing note and vitals reviewed.   ED Course  Procedures   DIAGNOSTIC STUDIES:  Oxygen Saturation is 93% on RA, adequate by my interpretation.    COORDINATION OF CARE:  4:10 AM Discussed treatment plan with pt at bedside and pt agreed to plan.  Labs Review Labs Reviewed  I-STAT CHEM 8, ED - Abnormal; Notable for the following:    Potassium 3.4 (*)    Chloride 96 (*)    Glucose, Bld 126 (*)    All other components within normal limits  CK  I-STAT BETA HCG BLOOD, ED ([facility], [facility], [facility] ONLY)    Imaging Review Ct Head Wo Contrast  10/06/2015  CLINICAL DATA:  Hit in back of head when shelf fell off wall. Initial encounter. EXAM: CT HEAD WITHOUT CONTRAST TECHNIQUE:  Contiguous axial images were obtained from the base of the skull through the vertex without intravenous contrast. COMPARISON:  None. FINDINGS: There is no evidence of acute infarction, mass lesion, or intra- or extra-axial hemorrhage on CT. The posterior fossa, including the cerebellum, brainstem and fourth ventricle, is within normal limits. The third and lateral ventricles, and basal ganglia are unremarkable in appearance. The cerebral hemispheres are symmetric in appearance, with normal gray-white differentiation. No mass effect or midline shift is seen. There is no evidence of fracture; left maxillary dental caries are noted. The orbits are within normal limits. The paranasal sinuses and mastoid air cells are well-aerated. No significant soft tissue abnormalities are seen. IMPRESSION: 1. No evidence  of traumatic intracranial injury or fracture. 2. Left maxillary dental caries noted. Electronically Signed   By: Garald Balding M.D.   On: 10/06/2015 05:42   Dg Knee Complete 4 Views Left  10/06/2015  CLINICAL DATA:  Status post fall forward, with left knee pain and abrasions. Initial encounter. EXAM: LEFT KNEE - COMPLETE 4+ VIEW COMPARISON:  Left knee MRI performed 02/04/2009 FINDINGS: There is no evidence of fracture or dislocation. Prominent marginal osteophytes are seen arising at all three compartments, with mild narrowing at the medial and patellofemoral compartments. A small knee joint effusion is suspected. The visualized soft tissues are otherwise grossly unremarkable in appearance. IMPRESSION: 1. No evidence of fracture or dislocation. 2. Prominent marginal osteophytes arising at all three compartments, with mild narrowing of the medial and patellofemoral compartments. 3. Suggestion of small knee joint effusion. Electronically Signed   By: Garald Balding M.D.   On: 10/06/2015 05:47   I have personally reviewed and evaluated these images and lab results as part of my medical decision-making.   EKG  Interpretation None      MDM   Final diagnoses:  None   Patient presents to the ED after a shelf fell on top of her.  She is drowsy but easily arousable, however she also just took her nightly meds.  Will obtain CT scan for further evaluation.  Patient given IVF.  Will check a CK and Cr level.  Also xray of her knee for an abrasion noted.  Imaging studies are negative. Tachycardia has resolved after IV fluids. Patient continues to appear well in no acute distress. Vital signs were within her normal limits and she is safe for discharge.   I personally performed the services described in this documentation, which was scribed in my presence. The recorded information has been reviewed and is accurate.       Everlene Balls, MD 10/06/15 [phone]

## 2015-10-06 NOTE — ED Notes (Addendum)
Pt states that she was sitting on the toilet in her bathroom and a wooden shelf fell off the wall and hit her in the back of her head and trapped her on the floor for 1 hour. Pt denies loc, no blurred vision, pt states that she has been a little light headed since. Pt AxOx4. Pt complains of slight headache.

## 2015-10-06 NOTE — ED Notes (Signed)
Pt verbalized understanding of d/c instructions and has no further questions. Pt stable and NAD.  

## 2015-10-29 ENCOUNTER — Encounter: Payer: Medicare HMO | Attending: Physical Medicine and Rehabilitation | Admitting: Registered Nurse

## 2015-10-29 ENCOUNTER — Encounter: Payer: Self-pay | Admitting: Registered Nurse

## 2015-10-29 VITALS — BP 131/76 | HR 95

## 2015-10-29 DIAGNOSIS — G609 Hereditary and idiopathic neuropathy, unspecified: Secondary | ICD-10-CM

## 2015-10-29 DIAGNOSIS — M12512 Traumatic arthropathy, left shoulder: Secondary | ICD-10-CM | POA: Diagnosis present

## 2015-10-29 DIAGNOSIS — M609 Myositis, unspecified: Secondary | ICD-10-CM | POA: Diagnosis present

## 2015-10-29 DIAGNOSIS — M791 Myalgia: Secondary | ICD-10-CM | POA: Diagnosis present

## 2015-10-29 DIAGNOSIS — Z79899 Other long term (current) drug therapy: Secondary | ICD-10-CM | POA: Diagnosis not present

## 2015-10-29 DIAGNOSIS — Z5181 Encounter for therapeutic drug level monitoring: Secondary | ICD-10-CM | POA: Diagnosis not present

## 2015-10-29 DIAGNOSIS — M129 Arthropathy, unspecified: Secondary | ICD-10-CM | POA: Insufficient documentation

## 2015-10-29 DIAGNOSIS — M19012 Primary osteoarthritis, left shoulder: Secondary | ICD-10-CM | POA: Diagnosis present

## 2015-10-29 DIAGNOSIS — M47817 Spondylosis without myelopathy or radiculopathy, lumbosacral region: Secondary | ICD-10-CM | POA: Diagnosis present

## 2015-10-29 DIAGNOSIS — G894 Chronic pain syndrome: Secondary | ICD-10-CM

## 2015-10-29 DIAGNOSIS — M19019 Primary osteoarthritis, unspecified shoulder: Secondary | ICD-10-CM | POA: Insufficient documentation

## 2015-10-29 DIAGNOSIS — M174 Other bilateral secondary osteoarthritis of knee: Secondary | ICD-10-CM

## 2015-10-29 DIAGNOSIS — IMO0001 Reserved for inherently not codable concepts without codable children: Secondary | ICD-10-CM

## 2015-10-29 MED ORDER — OXYCODONE HCL 15 MG PO TABS: 15.0000 mg | ORAL_TABLET | Freq: Three times a day (TID) | ORAL | Status: DC

## 2015-10-29 MED ORDER — LIDOCAINE 5 % EX PTCH: 1.0000 | MEDICATED_PATCH | Freq: Two times a day (BID) | CUTANEOUS | Status: DC

## 2015-10-29 NOTE — Progress Notes (Signed)
Subjective:    Patient ID: [name], female    DOB: 1966-05-15, 50 y.o.   MRN: [id number]  HPI: [name] is a 50 year old female who returns for follow up for chronic pain and medication refill. She says her pain is located in her lower back and bilateral knees left greater than right.She rates her pain 8. Her current exercise regime is walking.  States she quit smoking she's wearing nicotine patch it's been three weeks, encouraged to continue with smoking cessation. She verbalizes understanding. Also states she walking to the car when she slipped on snow she was able to brace herself and grabbed the car which prevented her falling. Educated on falls prevention she verbalizes understanding. She went to [facility] ED on 10/06/2015 she stated a overhead shelf fell on top of her while she was in the bathroom.  CT ordered: IMPRESSION: 1. No evidence of traumatic intracranial injury or fracture. 2. Left maxillary dental caries noted.  S/P Total Shoulder Arthroplasty by Dr. Mardelle Matte on 07/17/2015.  Pain Inventory Average Pain 7 Pain Right Now 8 My pain is sharp, stabbing and aching  In the last 24 hours, has pain interfered with the following? General activity 5 Relation with others 5 Enjoyment of life 5 What TIME of day is your pain at its worst? night Sleep (in general) Fair  Pain is worse with: walking, bending and sitting Pain improves with: rest Relief from Meds: 6  Mobility walk without assistance how many minutes can you walk? 45 do you drive?  yes  Function disabled: date disabled .  Neuro/Psych numbness trouble walking  Prior Studies Any changes since last visit?  no  Physicians involved in your care Any changes since last visit?  no   Family History  Problem Relation Age of Onset  . Diabetes Other   . Hypertension Other   . Schizophrenia Mother   . Hypertension Mother   . Stroke Father    Social History   Social History  . Marital  Status: Divorced    Spouse Name: N/A  . Number of Children: N/A  . Years of Education: N/A   Social History Main Topics  . Smoking status: Current Every Day Smoker -- 0.50 packs/day    Types: Cigarettes  . Smokeless tobacco: Never Used  . Alcohol Use: No  . Drug Use: No     Comment: goes to pain clinic  . Sexual Activity: Not Asked   Other Topics Concern  . None   Social History Narrative   Past Surgical History  Procedure Laterality Date  . Abdominal hysterectomy    . Arthroscopic knee    . Cardiovascular stress test  03/2010    Lexiscan Myoview: EF 55%, mild breast attenuation, no ischemia or scar  . Transthoracic echocardiogram  03/2010    EF 50-55%, normal LV size, no WMAs  . Tonsillectomy    . Total shoulder arthroplasty Left 07/17/2015    Procedure: TOTAL SHOULDER ARTHROPLASTY;  Surgeon: Marchia Bond, MD;  Location: [place] [facility];  Service: Orthopedics;  Laterality: Left;   Past Medical History  Diagnosis Date  . Fibromyalgia   . Bipolar 1 disorder ([place])   . Spinal stenosis   . Osteoarthritis   . Hypertension   . Obesity   . GERD (gastroesophageal reflux disease)   . PUD (peptic ulcer disease)   . Anxiety   . COPD (chronic obstructive pulmonary disease) ([place])   . Osteoarthritis of left shoulder 06/08/2013   BP  131/76 mmHg  Pulse 95  SpO2 100%  Opioid Risk Score:   Fall Risk Score:  `1  Depression screen PHQ 2/9  Depression screen [facility] 2/9 06/25/2015 05/28/2015 01/02/2015  Decreased Interest 0 1 1  Down, Depressed, Hopeless 0 1 1  PHQ - 2 Score 0 2 2  Altered sleeping - - 2  Tired, decreased energy - - 1  Change in appetite - - 1  Feeling bad or failure about yourself  - - 1  Trouble concentrating - - 0  Moving slowly or fidgety/restless - - 0  Suicidal thoughts - - 0  PHQ-9 Score - - 7     Review of Systems  All other systems reviewed and are negative.      Objective:   Physical Exam  Constitutional: She is oriented to person, place, and time. She  appears well-developed and well-nourished.  HENT:  Head: Normocephalic and atraumatic.  Neck: Normal range of motion. Neck supple.  Cardiovascular: Normal rate and regular rhythm.   Pulmonary/Chest: Effort normal and breath sounds normal.  Musculoskeletal:  Normal Muscle Bulk and Muscle Testing Reveals: Upper Extremities: Full ROM and Muscle Strength 5/5 Lumbar Paraspinal Tenderness: L-3- L-5 Lower Extremities: Right Full ROM and Muscle Strength 5/5 Left Lower Extremity Flexion Produces Pain into Patella Arises from chair slowly Antalgic Gait  Neurological: She is alert and oriented to person, place, and time.  Skin: Skin is warm and dry.  Psychiatric: She has a normal mood and affect.  Nursing note and vitals reviewed.         Assessment & Plan:  1. Fibromyalgia Syndrome: Continue with exercise and heat therapy. Encouraged to increase activity 2. Osteoarthritis of Bilateral Knees:  Orthopedist FollowingContinue with heat and exercise therapy.  3. Left Shoulder OA: S/PLeft Shoulder Arthroplasty. Orthopedist Following. Dr. Mardelle Matte 4. Bipolar Syndrome: On Depakote and Cymbalta.  5. Lumbar Spondylosis: Refilled: Oxycodone 15 mg one tablet three times a day #90 6. Insomnia: No Complaints continue to monitor 7. Muscle Spasms: Continue Bacolen TID as needed  8. Constipation: Continue Bisacodyl 9. Peripheral Neuropathy: RX: Lidocaine Patches  30 minutes of face to face patient care time was spent during this visit. All questions were encouraged and answered

## 2015-11-03 LAB — TOXASSURE SELECT,+ANTIDEPR,UR: PDF: 0

## 2015-11-05 NOTE — Progress Notes (Signed)
Urine drug screen for this encounter is consistent for prescribed medication 

## 2015-11-07 ENCOUNTER — Telehealth: Payer: Self-pay | Admitting: Registered Nurse

## 2015-11-07 NOTE — Telephone Encounter (Signed)
Called [name] she is aware her lidocaine patches has been approved until 07/24/201. She verbalizes understanding.

## 2015-11-08 NOTE — Telephone Encounter (Signed)
Approved through 05/05/2016 for auth  For lidocaine patches

## 2015-12-03 ENCOUNTER — Encounter: Payer: Medicare HMO | Admitting: Physical Medicine & Rehabilitation

## 2015-12-10 ENCOUNTER — Encounter: Payer: Self-pay | Admitting: Physical Medicine & Rehabilitation

## 2015-12-10 ENCOUNTER — Encounter: Payer: Medicare HMO | Attending: Physical Medicine and Rehabilitation | Admitting: Physical Medicine & Rehabilitation

## 2015-12-10 VITALS — BP 151/74 | HR 112

## 2015-12-10 DIAGNOSIS — Z79899 Other long term (current) drug therapy: Secondary | ICD-10-CM | POA: Diagnosis present

## 2015-12-10 DIAGNOSIS — M609 Myositis, unspecified: Secondary | ICD-10-CM | POA: Diagnosis present

## 2015-12-10 DIAGNOSIS — M47816 Spondylosis without myelopathy or radiculopathy, lumbar region: Secondary | ICD-10-CM

## 2015-12-10 DIAGNOSIS — M47817 Spondylosis without myelopathy or radiculopathy, lumbosacral region: Secondary | ICD-10-CM | POA: Diagnosis not present

## 2015-12-10 DIAGNOSIS — M791 Myalgia: Secondary | ICD-10-CM | POA: Insufficient documentation

## 2015-12-10 DIAGNOSIS — M19012 Primary osteoarthritis, left shoulder: Secondary | ICD-10-CM | POA: Diagnosis present

## 2015-12-10 DIAGNOSIS — M12512 Traumatic arthropathy, left shoulder: Secondary | ICD-10-CM | POA: Insufficient documentation

## 2015-12-10 DIAGNOSIS — M19019 Primary osteoarthritis, unspecified shoulder: Secondary | ICD-10-CM | POA: Insufficient documentation

## 2015-12-10 DIAGNOSIS — M129 Arthropathy, unspecified: Secondary | ICD-10-CM | POA: Diagnosis present

## 2015-12-10 DIAGNOSIS — M170 Bilateral primary osteoarthritis of knee: Secondary | ICD-10-CM

## 2015-12-10 DIAGNOSIS — Z5181 Encounter for therapeutic drug level monitoring: Secondary | ICD-10-CM | POA: Diagnosis present

## 2015-12-10 MED ORDER — OXYCODONE HCL 15 MG PO TABS: 15.0000 mg | ORAL_TABLET | Freq: Three times a day (TID) | ORAL | Status: DC

## 2015-12-10 NOTE — Progress Notes (Signed)
Subjective:    Patient ID: [name], female    DOB: 11/15/1965, 50 y.o.   MRN: [id number]  HPI   [name] is here in follow up of her chronic pain. She had shoulder surgery last year which has made a huge difference. She has very good movement and use of the left arm now.    She is looking at gastric bypass surgery this year so that she can finally drop some weight.   Her pain focus is now on her low back and bilatera knees. We have not injected her knees before. Xrays show advanced OA in her left knee from last year. She is hopeful tha weight loss will help her knees.   She is taking the oxycodone 15mg  TID. This is helping control her pain. She does note that the oxycodone makes her nose itch.   She is still concerned about tremors. I asked if thye worsened when she was anxious and she said yes.  Pain Inventory Average Pain 6 Pain Right Now 6 My pain is burning, dull and aching  In the last 24 hours, has pain interfered with the following? General activity 6 Relation with others 5 Enjoyment of life 7 What TIME of day is your pain at its worst? night Sleep (in general) Fair  Pain is worse with: walking, bending, sitting and standing Pain improves with: medication Relief from Meds: 5  Mobility walk without assistance how many minutes can you walk? 15 ability to climb steps?  yes do you drive?  yes  Function not employed: date last employed .  Neuro/Psych numbness tremor trouble walking  Prior Studies Any changes since last visit?  no  Physicians involved in your care Any changes since last visit?  no   Family History  Problem Relation Age of Onset  . Diabetes Other   . Hypertension Other   . Schizophrenia Mother   . Hypertension Mother   . Stroke Father    Social History   Social History  . Marital Status: Divorced    Spouse Name: N/A  . Number of Children: N/A  . Years of Education: N/A   Social History Main Topics  . Smoking status:  Current Every Day Smoker -- 0.50 packs/day    Types: Cigarettes  . Smokeless tobacco: Never Used  . Alcohol Use: No  . Drug Use: No     Comment: goes to pain clinic  . Sexual Activity: Not Asked   Other Topics Concern  . None   Social History Narrative   Past Surgical History  Procedure Laterality Date  . Abdominal hysterectomy    . Arthroscopic knee    . Cardiovascular stress test  03/2010    Lexiscan Myoview: EF 55%, mild breast attenuation, no ischemia or scar  . Transthoracic echocardiogram  03/2010    EF 50-55%, normal LV size, no WMAs  . Tonsillectomy    . Total shoulder arthroplasty Left 07/17/2015    Procedure: TOTAL SHOULDER ARTHROPLASTY;  Surgeon: Marchia Bond, MD;  Location: [place];  Service: Orthopedics;  Laterality: Left;   Past Medical History  Diagnosis Date  . Fibromyalgia   . Bipolar 1 disorder ([place])   . Spinal stenosis   . Osteoarthritis   . Hypertension   . Obesity   . GERD (gastroesophageal reflux disease)   . PUD (peptic ulcer disease)   . Anxiety   . COPD (chronic obstructive pulmonary disease) ([place])   . Osteoarthritis of left shoulder 06/08/2013   BP 151/74  mmHg  Pulse 112  SpO2 97%  Opioid Risk Score:   Fall Risk Score:  `1  Depression screen PHQ 2/9  Depression screen [facility] 2/9 12/10/2015 06/25/2015 05/28/2015 01/02/2015  Decreased Interest 0 0 1 1  Down, Depressed, Hopeless 0 0 1 1  PHQ - 2 Score 0 0 2 2  Altered sleeping - - - 2  Tired, decreased energy - - - 1  Change in appetite - - - 1  Feeling bad or failure about yourself  - - - 1  Trouble concentrating - - - 0  Moving slowly or fidgety/restless - - - 0  Suicidal thoughts - - - 0  PHQ-9 Score - - - 7     Review of Systems  All other systems reviewed and are negative.      Objective:   Physical Exam  General: Alert and oriented x 3, No apparent distress. obese  HEENT: Head is normocephalic, atraumatic, PERRLA, EOMI, sclera anicteric, oral mucosa pink and moist, dentition  intact, ext ear canals clear,  Neck: Supple without JVD or lymphadenopathy  Heart: Reg rate and rhythm. No murmurs rubs or gallops  Chest: CTA bilaterally without wheezes, rales, or rhonchi; no distress  Abdomen: Soft, non-tender, non-distended, bowel sounds positive.  Extremities: No clubbing, cyanosis, or edema. Pulses are 2+  Skin: Clean and intact without signs of breakdown. Numerous tattoos noted.  Neuro: Pt is cognitively appropriate with normal insight, memory, and awareness. Cranial nerves 2-12 are intact. Sensory exam is normal. Reflexes are 2+ in all 4's except for the achilles which were trace to 1+. Fine motor coordination is intact. Mild, fine resting and intentional hand tremors. Motor function is grossly 5/5 except for inhibition at the left shoulder.  Musculoskeletal: Left shoulder pain with ER, IR and abduction. S. Mild crepitus right knee and moderate to severe on the left. She had medial joint line pain right knee and med/lat joint line pain on left. Meniscal maneuvers provoked pain in the medial right knee and med/lat on the left.  Psych:Affect was pleasant and appropriate. .   Assessment & Plan:   1. Fibromyalgia syndrome  2. Osteoarthritis of bilateral knees  3. 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 Bipolar syndrome  5. Lumbar spondylosis with facet arthropathy  6. Insomnia  7. Tremors    Plan:  1. Continue with regular exercise and stretching at home.  2. Can consider lumbar facet injections. Would need lumbar spine films at least, first. These were ordered today.  3. Refilled oxycodone for pain control. 15mg  TID #90 4. Cymbalta---continue for mood and pain effects. I believe this has helped  5. After informed consent and preparation of the skin with betadine and isopropyl alcohol, I injected 6mg  (1cc) of celestone and 4cc of 1% lidocaine into the bilateral knees via antero-lateral approach. Additionally,  aspiration was performed prior to injection. The patient tolerated well, and no complications were encountered. Afterward the area was cleaned and dressed. Post- injection instructions were provided. Incidentally, tremors increased during injection today  6. elavil 50 mg qhs for sleep/pain has been effective.  7. Flexeril for muscle spasms and sleep. .  8. Tremors: likely intentional tremors, anxiety related---she is still concerned about Parkinson's due to her family history 9. Follow up with me or my NP in 1 months. 30 minutes of face to face patient care time were spent during this visit. All questions were encouraged and answered. I

## 2015-12-18 ENCOUNTER — Ambulatory Visit (HOSPITAL_COMMUNITY)
Admission: RE | Admit: 2015-12-18 | Discharge: 2015-12-18 | Disposition: A | Discharge status: Home / Self Care | Payer: Medicare HMO | Source: Ambulatory Visit | Attending: Registered Nurse | Admitting: Registered Nurse

## 2015-12-18 ENCOUNTER — Emergency Department (HOSPITAL_COMMUNITY)
Admission: EM | Admit: 2015-12-18 | Discharge: 2015-12-18 | Disposition: A | Discharge status: Other Acute Inpatient Hospital | Payer: Medicare HMO

## 2015-12-18 ENCOUNTER — Telehealth: Payer: Self-pay | Admitting: Registered Nurse

## 2015-12-18 DIAGNOSIS — M25561 Pain in right knee: Secondary | ICD-10-CM | POA: Diagnosis present

## 2015-12-18 DIAGNOSIS — M1711 Unilateral primary osteoarthritis, right knee: Secondary | ICD-10-CM | POA: Insufficient documentation

## 2015-12-18 NOTE — Telephone Encounter (Signed)
Spoke with [name], she states she is having excruciating right knee pain. Also states when she walks it feels as though her right knee will give out on her. She denies falling. She seen [name] on 12/10/15 she received bilateral steroid knee injections with celestone and lidocaine. Will order X-ray, awaiting results. Based on the results referral for orthopaedic will be placed, she verbalizes understanding.

## 2015-12-19 ENCOUNTER — Telehealth: Payer: Self-pay | Admitting: Registered Nurse

## 2015-12-19 DIAGNOSIS — M1710 Unilateral primary osteoarthritis, unspecified knee: Secondary | ICD-10-CM

## 2015-12-19 NOTE — Telephone Encounter (Signed)
I spoke with [name] regarding her X-ray Results: Right Knee X-ray:  RIGHT KNEE - 1-2 VIEW  COMPARISON: MRI 08/18/2004  FINDINGS: Advanced tricompartment degenerative changes with joint space loss and spurring. No acute bony abnormality. Specifically, no fracture, subluxation, or dislocation. Soft tissues are intact. No joint effusion.  IMPRESSION: Advanced degenerative changes. No acute bony abnormality.  She seen [name] on 12/10/2015  She received a celestone and lidocaine injection. We will refer her to Dr. Para March

## 2015-12-26 ENCOUNTER — Telehealth: Payer: Self-pay | Admitting: Registered Nurse

## 2015-12-26 DIAGNOSIS — M25562 Pain in left knee: Secondary | ICD-10-CM

## 2015-12-26 NOTE — Telephone Encounter (Signed)
[name],  Called office this morning stating," she was walking out of her house last night when she slipped on a icy patch and landed on her left knee. She didn't seek medical attention. Also states she is in excruciating pain. We will order an X-ray. Last week a referral was placed to Dr. Noemi Chapel office staff is calling to follow up on the referral. [name] is aware, and verbalizes understanding.

## 2015-12-27 ENCOUNTER — Telehealth: Payer: Self-pay | Admitting: Physical Medicine & Rehabilitation

## 2015-12-27 NOTE — Telephone Encounter (Signed)
Return [name] call, she followed up with her PCP regarding her Left Knee Pain. Also states she had an X-ray in their office. She's awaiting an appointment with Dr. Noemi Chapel .

## 2015-12-27 NOTE — Telephone Encounter (Signed)
Patient saw her primary doctor and she got her knee xray done there.  They have given her a disk for you to look at when she comes to her appointment on Monday 12/31/15.

## 2015-12-31 ENCOUNTER — Encounter: Payer: Medicare HMO | Admitting: Registered Nurse

## 2016-01-01 ENCOUNTER — Encounter: Payer: Self-pay | Admitting: Registered Nurse

## 2016-01-01 ENCOUNTER — Encounter: Payer: Medicare HMO | Attending: Physical Medicine and Rehabilitation | Admitting: Registered Nurse

## 2016-01-01 VITALS — BP 124/85 | HR 75

## 2016-01-01 DIAGNOSIS — M179 Osteoarthritis of knee, unspecified: Secondary | ICD-10-CM

## 2016-01-01 DIAGNOSIS — M609 Myositis, unspecified: Secondary | ICD-10-CM | POA: Diagnosis present

## 2016-01-01 DIAGNOSIS — M1710 Unilateral primary osteoarthritis, unspecified knee: Secondary | ICD-10-CM

## 2016-01-01 DIAGNOSIS — M19019 Primary osteoarthritis, unspecified shoulder: Secondary | ICD-10-CM | POA: Diagnosis present

## 2016-01-01 DIAGNOSIS — M19012 Primary osteoarthritis, left shoulder: Secondary | ICD-10-CM | POA: Diagnosis present

## 2016-01-01 DIAGNOSIS — M47817 Spondylosis without myelopathy or radiculopathy, lumbosacral region: Secondary | ICD-10-CM | POA: Insufficient documentation

## 2016-01-01 DIAGNOSIS — M791 Myalgia: Secondary | ICD-10-CM | POA: Insufficient documentation

## 2016-01-01 DIAGNOSIS — M170 Bilateral primary osteoarthritis of knee: Secondary | ICD-10-CM | POA: Diagnosis not present

## 2016-01-01 DIAGNOSIS — M12512 Traumatic arthropathy, left shoulder: Secondary | ICD-10-CM | POA: Diagnosis present

## 2016-01-01 DIAGNOSIS — G894 Chronic pain syndrome: Secondary | ICD-10-CM

## 2016-01-01 DIAGNOSIS — M25562 Pain in left knee: Secondary | ICD-10-CM | POA: Diagnosis not present

## 2016-01-01 DIAGNOSIS — Z79899 Other long term (current) drug therapy: Secondary | ICD-10-CM | POA: Diagnosis present

## 2016-01-01 DIAGNOSIS — Z5181 Encounter for therapeutic drug level monitoring: Secondary | ICD-10-CM

## 2016-01-01 DIAGNOSIS — M129 Arthropathy, unspecified: Secondary | ICD-10-CM | POA: Insufficient documentation

## 2016-01-01 MED ORDER — OXYCODONE HCL 15 MG PO TABS: 15.0000 mg | ORAL_TABLET | Freq: Three times a day (TID) | ORAL | Status: DC

## 2016-01-01 NOTE — Progress Notes (Signed)
Subjective:    Patient ID: RUQAYYA THEVENIN, female    DOB: Nov 01, 1965, 50 y.o.   MRN: [id number]  HPI: Ms. Soren Langworthy is a 50 year old female who returns for follow up for chronic pain and medication refill. She states her pain is located in her bilateral hands, lower back and left knee.She rates her pain 7. Her current exercise regime is walking.   S/P Total Shoulder Arthroplasty by Dr. Mardelle Matte on 07/17/2015.  Pain Inventory Average Pain 5 Pain Right Now 7 My pain is burning, stabbing and aching  In the last 24 hours, has pain interfered with the following? General activity 4 Relation with others 5 Enjoyment of life 6 What TIME of day is your pain at its worst? night Sleep (in general) Fair  Pain is worse with: walking, sitting and standing Pain improves with: medication Relief from Meds: 5  Mobility how many minutes can you walk? 15 ability to climb steps?  yes do you drive?  yes  Function Do you have any goals in this area?  no  Neuro/Psych No problems in this area  Prior Studies Any changes since last visit?  no  Physicians involved in your care Any changes since last visit?  no   Family History  Problem Relation Age of Onset  . Diabetes Other   . Hypertension Other   . Schizophrenia Mother   . Hypertension Mother   . Stroke Father    Social History   Social History  . Marital Status: Divorced    Spouse Name: N/A  . Number of Children: N/A  . Years of Education: N/A   Social History Main Topics  . Smoking status: Current Every Day Smoker -- 0.50 packs/day    Types: Cigarettes  . Smokeless tobacco: Never Used  . Alcohol Use: No  . Drug Use: No     Comment: goes to pain clinic  . Sexual Activity: Not Asked   Other Topics Concern  . None   Social History Narrative   Past Surgical History  Procedure Laterality Date  . Abdominal hysterectomy    . Arthroscopic knee    . Cardiovascular stress test  03/2010    Lexiscan Myoview: EF 55%,  mild breast attenuation, no ischemia or scar  . Transthoracic echocardiogram  03/2010    EF 50-55%, normal LV size, no WMAs  . Tonsillectomy    . Total shoulder arthroplasty Left 07/17/2015    Procedure: TOTAL SHOULDER ARTHROPLASTY;  Surgeon: Marchia Bond, MD;  Location: [facility];  Service: Orthopedics;  Laterality: Left;   Past Medical History  Diagnosis Date  . Fibromyalgia   . Bipolar 1 disorder ([place])   . Spinal stenosis   . Osteoarthritis   . Hypertension   . Obesity   . GERD (gastroesophageal reflux disease)   . PUD (peptic ulcer disease)   . Anxiety   . COPD (chronic obstructive pulmonary disease) ([place])   . Osteoarthritis of left shoulder 06/08/2013   BP 124/85 mmHg  Pulse 75  SpO2 94%  Opioid Risk Score:   Fall Risk Score:  `1  Depression screen PHQ 2/9  Depression screen [facility] 2/9 12/10/2015 06/25/2015 05/28/2015 01/02/2015  Decreased Interest 0 0 1 1  Down, Depressed, Hopeless 0 0 1 1  PHQ - 2 Score 0 0 2 2  Altered sleeping - - - 2  Tired, decreased energy - - - 1  Change in appetite - - - 1  Feeling bad or failure about yourself  - - -  1  Trouble concentrating - - - 0  Moving slowly or fidgety/restless - - - 0  Suicidal thoughts - - - 0  PHQ-9 Score - - - 7     Review of Systems  Constitutional: Positive for unexpected weight change.  Gastrointestinal: Positive for constipation.  Endocrine:       High blood sugar  All other systems reviewed and are negative.      Objective:   Physical Exam  Constitutional: She is oriented to person, place, and time. She appears well-developed and well-nourished.  HENT:  Head: Normocephalic and atraumatic.  Neck: Normal range of motion. Neck supple.  Cardiovascular: Normal rate and regular rhythm.   Pulmonary/Chest: Effort normal and breath sounds normal.  Musculoskeletal:  Normal Muscle Bulk and Muscle Testing Reveals: Upper Extremities: Full ROM and Muscle Strength 5/5 Lumbar Paraspinal Tenderness: L-3- L-5 Lower  Extremities: Full ROM and Muscle Strength 5/5 Left Lower Extremity Flexion Produces Pain into Patella Arises from chair slowly Antalgic Gait  Neurological: She is alert and oriented to person, place, and time.  Skin: Skin is warm and dry.  Psychiatric: She has a normal mood and affect.  Nursing note and vitals reviewed.         Assessment & Plan:  1. Fibromyalgia Syndrome: Continue with exercise and heat therapy. Encouraged to increase activity 2. Osteoarthritis of Bilateral Knees:  Orthopedist FollowingContinue with heat and exercise therapy.  3. Left Shoulder OA: S/PLeft Shoulder Arthroplasty. Orthopedist Following. Dr. Mardelle Matte 4. Bipolar Syndrome: On Depakote and Cymbalta.  5. Lumbar Spondylosis: Refilled: Oxycodone 15 mg one tablet three times a day #90 6. Insomnia: No Complaints continue to monitor 7. Muscle Spasms: Continue Bacolen TID as needed  8. Constipation: Continue Bisacodyl 9. Peripheral Neuropathy: Continue: Lidocaine Patches  30 minutes of face to face patient care time was spent during this visit. All questions were encouraged and answered

## 2016-01-07 ENCOUNTER — Ambulatory Visit: Payer: Medicare HMO | Admitting: Registered Nurse

## 2016-01-24 ENCOUNTER — Encounter (HOSPITAL_COMMUNITY): Payer: Self-pay | Admitting: Emergency Medicine

## 2016-01-24 ENCOUNTER — Ambulatory Visit (HOSPITAL_COMMUNITY)
Admission: EM | Admit: 2016-01-24 | Discharge: 2016-01-24 | Disposition: A | Discharge status: Home / Self Care | Payer: Medicare HMO | Attending: Family Medicine | Admitting: Family Medicine

## 2016-01-24 DIAGNOSIS — L989 Disorder of the skin and subcutaneous tissue, unspecified: Secondary | ICD-10-CM | POA: Diagnosis not present

## 2016-01-24 NOTE — Discharge Instructions (Signed)
Keep clean and dry. For worsening, increased redness, red streaks, pain or tenderness may return. No current signs of infection. Has already started healing. Like "heat bumps".

## 2016-01-24 NOTE — ED Provider Notes (Signed)
CSN: [id number]     Arrival date & time 01/24/16  1617 History   First MD Initiated Contact with Patient 01/24/16 1812     Chief Complaint  Patient presents with  . Insect Bite   (Consider location/radiation/quality/duration/timing/severity/associated sxs/prior Treatment) HPI Comments: 50 year old obese female complaining of a spot on her left buttock noticed a last PM. She states she scratched it and there was a small amount of sticky fluid. Denies pain but there was minor itching.   Past Medical History  Diagnosis Date  . Fibromyalgia   . Bipolar 1 disorder ([place])   . Spinal stenosis   . Osteoarthritis   . Hypertension   . Obesity   . GERD (gastroesophageal reflux disease)   . PUD (peptic ulcer disease)   . Anxiety   . COPD (chronic obstructive pulmonary disease) ([place])   . Osteoarthritis of left shoulder 06/08/2013   Past Surgical History  Procedure Laterality Date  . Abdominal hysterectomy    . Arthroscopic knee    . Cardiovascular stress test  03/2010    Lexiscan Myoview: EF 55%, mild breast attenuation, no ischemia or scar  . Transthoracic echocardiogram  03/2010    EF 50-55%, normal LV size, no WMAs  . Tonsillectomy    . Total shoulder arthroplasty Left 07/17/2015    Procedure: TOTAL SHOULDER ARTHROPLASTY;  Surgeon: Joy Bond, MD;  Location: [facility];  Service: Orthopedics;  Laterality: Left;   Family History  Problem Relation Age of Onset  . Diabetes Other   . Hypertension Other   . Schizophrenia Mother   . Hypertension Mother   . Stroke Father    Social History  Substance Use Topics  . Smoking status: Current Every Day Smoker -- 0.50 packs/day    Types: Cigarettes  . Smokeless tobacco: Never Used  . Alcohol Use: No   OB History    No data available     Review of Systems  Constitutional: Negative.   Genitourinary: Negative.   Musculoskeletal: Negative.   Skin:       As per history of present illness  Neurological: Negative.   All other systems  reviewed and are negative.   Allergies  Abilify; Latuda; Naproxen; and Wellbutrin  Home Medications   Prior to Admission medications   Medication Sig Start Date End Date Taking? Authorizing Provider  albuterol (PROAIR HFA) 108 (90 Base) MCG/ACT inhaler Inhale 2 puffs into the lungs. 10/04/15 10/03/16  Historical Provider, MD  ALPRAZolam [name]) 1 MG tablet Take 1 mg by mouth 3 (three) times daily.  10/30/14   Historical Provider, MD  amitriptyline (ELAVIL) 50 MG tablet Take 1 tablet by mouth at bedtime. 08/29/15   Historical Provider, MD  aspirin 81 MG chewable tablet Chew 81 mg by mouth at bedtime.     Historical Provider, MD  baclofen (LIORESAL) 10 MG tablet Take 1 tablet (10 mg total) by mouth 3 (three) times daily as needed for muscle spasms. 07/17/15   Joy Bond, MD  bisacodyl (BISACODYL) 5 MG EC tablet Take 1 tablet (5 mg total) by mouth daily as needed for moderate constipation. 05/28/15   Joy Hugger, NP  divalproex (DEPAKOTE ER) 500 MG 24 hr tablet Take 1,000 mg by mouth every evening.     Historical Provider, MD  DULoxetine (CYMBALTA) 60 MG capsule TAKE ONE CAPSULE BY MOUTH DAILY 08/29/15   Joy Hugger, NP  hydrochlorothiazide (HYDRODIURIL) 12.5 MG tablet Take 12.5 mg by mouth daily.  06/19/15   Historical Provider, MD  lidocaine (LIDODERM) 5 % Place 1 patch onto the skin every 12 (twelve) hours. Remove & Discard patch within 12 hours or as directed 10/29/15   Joy Hugger, NP  nitroGLYCERIN (NITROSTAT) 0.4 MG SL tablet Place 0.4 mg under the tongue every 5 (five) minutes as needed. 06/11/15   Historical Provider, MD  oxyCODONE (ROXICODONE) 15 MG immediate release tablet Take 1 tablet (15 mg total) by mouth 3 (three) times daily. 01/01/16   Joy Hugger, NP  oxyCODONE (ROXICODONE) 15 MG immediate release tablet Take 1 tablet (15 mg total) by mouth 3 (three) times daily. 01/01/16   Joy Hugger, NP  sennosides-docusate sodium (SENOKOT-S) 8.6-50 MG tablet Take 2 tablets by  mouth daily. 07/17/15   Joy Bond, MD   Meds Ordered and Administered this Visit  Medications - No data to display  BP 132/84 mmHg  Pulse 101  Temp(Src) 98.5 F (36.9 C) (Oral)  Resp 16  SpO2 99% No data found.   Physical Exam  Constitutional: She is oriented to person, place, and time. She appears well-developed and well-nourished. No distress.  Neck: Normal range of motion. Neck supple.  Cardiovascular: Normal rate.   Pulmonary/Chest: Effort normal. No respiratory distress.  Musculoskeletal: Normal range of motion. She exhibits no edema.  Neurological: She is alert and oriented to person, place, and time. She exhibits normal muscle tone.  Skin: Skin is warm and dry. No rash noted.  There are 2 small annular superficial red lesions measuring approximately 3 mm in diameter adjacent to each other involving the epidermis. These appear to have been 2 small papules that have since begun to heal after it was scratched last PM. There is no sign of infection. No induration, no lymphangitis no bleeding or drainage. No sign of abscess or pustule formation.  Psychiatric: She has a normal mood and affect.  Nursing note and vitals reviewed.   ED Course  Procedures (including critical care time)  Labs Review Labs Reviewed - No data to display  Imaging Review No results found.   Visual Acuity Review  Right Eye Distance:   Left Eye Distance:   Bilateral Distance:    Right Eye Near:   Left Eye Near:    Bilateral Near:         MDM   1. Skin lesion, superficial    Keep clean and dry. For worsening, increased redness, red streaks, pain or tenderness may return. No current signs of infection. Has already started healing. Like "heat bumps".     Joy Napoleon, NP 01/24/16 1840

## 2016-01-24 NOTE — ED Notes (Signed)
Bump to left buttocks, noticed last night.  Patient scratched the area last night and felt liquid around wound after scratching area.

## 2016-01-30 ENCOUNTER — Telehealth: Payer: Self-pay | Admitting: Physical Medicine & Rehabilitation

## 2016-01-30 NOTE — Telephone Encounter (Signed)
Patient called requesting a shot in her hand, left her a voicemail stating to call our office to schedule an appointment with [name].

## 2016-02-05 ENCOUNTER — Encounter: Payer: Medicare HMO | Attending: Physical Medicine and Rehabilitation | Admitting: Registered Nurse

## 2016-02-05 ENCOUNTER — Encounter: Payer: Self-pay | Admitting: Registered Nurse

## 2016-02-05 VITALS — BP 116/79 | HR 88

## 2016-02-05 DIAGNOSIS — M47817 Spondylosis without myelopathy or radiculopathy, lumbosacral region: Secondary | ICD-10-CM | POA: Diagnosis not present

## 2016-02-05 DIAGNOSIS — Z79899 Other long term (current) drug therapy: Secondary | ICD-10-CM

## 2016-02-05 DIAGNOSIS — M609 Myositis, unspecified: Secondary | ICD-10-CM | POA: Insufficient documentation

## 2016-02-05 DIAGNOSIS — M12512 Traumatic arthropathy, left shoulder: Secondary | ICD-10-CM | POA: Insufficient documentation

## 2016-02-05 DIAGNOSIS — M129 Arthropathy, unspecified: Secondary | ICD-10-CM | POA: Diagnosis present

## 2016-02-05 DIAGNOSIS — M25562 Pain in left knee: Secondary | ICD-10-CM

## 2016-02-05 DIAGNOSIS — Z5181 Encounter for therapeutic drug level monitoring: Secondary | ICD-10-CM | POA: Diagnosis not present

## 2016-02-05 DIAGNOSIS — G894 Chronic pain syndrome: Secondary | ICD-10-CM

## 2016-02-05 DIAGNOSIS — M791 Myalgia: Secondary | ICD-10-CM | POA: Diagnosis present

## 2016-02-05 DIAGNOSIS — M19012 Primary osteoarthritis, left shoulder: Secondary | ICD-10-CM | POA: Diagnosis present

## 2016-02-05 DIAGNOSIS — M19019 Primary osteoarthritis, unspecified shoulder: Secondary | ICD-10-CM | POA: Insufficient documentation

## 2016-02-05 MED ORDER — OXYCODONE HCL 15 MG PO TABS: 15.0000 mg | ORAL_TABLET | Freq: Three times a day (TID) | ORAL | Status: DC

## 2016-02-05 NOTE — Progress Notes (Signed)
Subjective:    Patient ID: [name], female    DOB: 13-Nov-1965, 50 y.o.   MRN: [id number]  HPI: [name] is a 50 year old female who returns for follow up for chronic pain and medication refill. She states her pain is located in her  lower back and left knee.She rates her pain 5. Her current exercise regime is walking.  [name] is scheduled for Total Left Knee Arthroplasty on 03/04/16 with Dr. Mardelle Matte. [name] forgot her medication bottle according to [name] her Oxycodone was picked up on 01/10/16. Narcotic Policy reviewed she verbalizes understanding.  Pain Inventory Average Pain 7 Pain Right Now 5 My pain is sharp, burning, stabbing, tingling and aching  In the last 24 hours, has pain interfered with the following? General activity 7 Relation with others 5 Enjoyment of life 6 What TIME of day is your pain at its worst? evening Sleep (in general) Fair  Pain is worse with: bending, sitting and standing Pain improves with: . Relief from Meds: .  Mobility ability to climb steps?  yes do you drive?  yes Do you have any goals in this area?  yes  Function disabled: date disabled 2004 Do you have any goals in this area?  no  Neuro/Psych bowel control problems numbness trouble walking spasms anxiety  Prior Studies Any changes since last visit?  no  Physicians involved in your care Any changes since last visit?  no   Family History  Problem Relation Age of Onset  . Diabetes Other   . Hypertension Other   . Schizophrenia Mother   . Hypertension Mother   . Stroke Father    Social History   Social History  . Marital Status: Divorced    Spouse Name: N/A  . Number of Children: N/A  . Years of Education: N/A   Social History Main Topics  . Smoking status: Current Every Day Smoker -- 0.50 packs/day    Types: Cigarettes  . Smokeless tobacco: Never Used  . Alcohol Use: No  . Drug Use: No     Comment: goes to pain clinic  . Sexual Activity:  Not on file   Other Topics Concern  . Not on file   Social History Narrative   Past Surgical History  Procedure Laterality Date  . Abdominal hysterectomy    . Arthroscopic knee    . Cardiovascular stress test  03/2010    Lexiscan Myoview: EF 55%, mild breast attenuation, no ischemia or scar  . Transthoracic echocardiogram  03/2010    EF 50-55%, normal LV size, no WMAs  . Tonsillectomy    . Total shoulder arthroplasty Left 07/17/2015    Procedure: TOTAL SHOULDER ARTHROPLASTY;  Surgeon: Marchia Bond, MD;  Location: [facility];  Service: Orthopedics;  Laterality: Left;   Past Medical History  Diagnosis Date  . Fibromyalgia   . Bipolar 1 disorder ([place])   . Spinal stenosis   . Osteoarthritis   . Hypertension   . Obesity   . GERD (gastroesophageal reflux disease)   . PUD (peptic ulcer disease)   . Anxiety   . COPD (chronic obstructive pulmonary disease) ([place])   . Osteoarthritis of left shoulder 06/08/2013   There were no vitals taken for this visit.  Opioid Risk Score:   Fall Risk Score:  `1  Depression screen PHQ 2/9  Depression screen [facility] 2/9 01/01/2016 12/10/2015 06/25/2015 05/28/2015 01/02/2015  Decreased Interest 2 0 0 1 1  Down, Depressed, Hopeless 1 0 0  1 1  PHQ - 2 Score 3 0 0 2 2  Altered sleeping 1 - - - 2  Tired, decreased energy 1 - - - 1  Change in appetite 2 - - - 1  Feeling bad or failure about yourself  2 - - - 1  Trouble concentrating 1 - - - 0  Moving slowly or fidgety/restless 1 - - - 0  Suicidal thoughts 0 - - - 0  PHQ-9 Score 11 - - - 7  Difficult doing work/chores Not difficult at all - - - -     Review of Systems     Objective:   Physical Exam  Constitutional: She is oriented to person, place, and time. She appears well-developed and well-nourished.  HENT:  Head: Normocephalic and atraumatic.  Neck: Normal range of motion. Neck supple.  Cardiovascular: Normal rate and regular rhythm.   Pulmonary/Chest: Effort normal and breath sounds normal.    Musculoskeletal:  Normal Muscle Bulk and Muscle Testing Reveals: Upper Extremities: Full ROM and Muscle Strength 5/5 Lumbar Paraspinal Tenderness: L-3- L-5 Lower Extremities: Full ROM and Muscle Strength 5/5 Left Lower Extremity Flexion Produces Pain into Patella Arises from chair slowly Antalgic gait  Neurological: She is alert and oriented to person, place, and time.  Skin: Skin is warm and dry.  Psychiatric: She has a normal mood and affect.  Nursing note and vitals reviewed.         Assessment & Plan:  1. Fibromyalgia Syndrome: Continue with exercise and heat therapy. Encouraged to increase activity 2. Osteoarthritis of Bilateral Knees:  Orthopedist FollowingContinue with heat and exercise therapy. Schedule for Left Knee Arthroplasty on 03/04/16 with Dr. Mardelle Matte 3. Left Shoulder OA: S/PLeft Shoulder Arthroplasty. Orthopedist Following. Dr. Mardelle Matte 4. Bipolar Syndrome: On Depakote and Cymbalta.  5. Lumbar Spondylosis: Refilled: Oxycodone 15 mg one tablet three times a day #90. We will continue the opioid monitoring program, this consists of regular clinic visits, examinations, urine drug screen, pill counts as well as use of [place] Controlled Substance Reporting System. 6. Insomnia: No Complaints continue to monitor 7. Muscle Spasms: Continue Bacolen TID as needed  8. Constipation: Continue Bisacodyl 9. Peripheral Neuropathy: Continue: Lidocaine Patches  20 minutes of face to face patient care time was spent during this visit. All questions were encouraged and answered

## 2016-02-15 ENCOUNTER — Other Ambulatory Visit: Payer: Self-pay | Admitting: Orthopedic Surgery

## 2016-02-20 ENCOUNTER — Encounter (HOSPITAL_COMMUNITY)
Admission: RE | Admit: 2016-02-20 | Discharge: 2016-02-20 | Disposition: A | Discharge status: Home / Self Care | Payer: Medicare HMO | Source: Ambulatory Visit | Attending: Anesthesiology | Admitting: Anesthesiology

## 2016-02-20 ENCOUNTER — Encounter (HOSPITAL_COMMUNITY): Payer: Self-pay

## 2016-02-20 DIAGNOSIS — Z01812 Encounter for preprocedural laboratory examination: Secondary | ICD-10-CM | POA: Insufficient documentation

## 2016-02-20 DIAGNOSIS — Z01810 Encounter for preprocedural cardiovascular examination: Secondary | ICD-10-CM | POA: Diagnosis present

## 2016-02-20 DIAGNOSIS — Z01818 Encounter for other preprocedural examination: Secondary | ICD-10-CM

## 2016-02-20 HISTORY — DX: Depression, unspecified: F32.A

## 2016-02-20 HISTORY — DX: Malignant (primary) neoplasm, unspecified: C80.1

## 2016-02-20 HISTORY — DX: Pneumonia, unspecified organism: J18.9

## 2016-02-20 HISTORY — DX: Major depressive disorder, single episode, unspecified: F32.9

## 2016-02-20 LAB — BASIC METABOLIC PANEL
Anion gap: 9 (ref 5–15)
BUN: 14 mg/dL (ref 6–20)
CALCIUM: 9.7 mg/dL (ref 8.9–10.3)
CHLORIDE: 101 mmol/L (ref 101–111)
CO2: 30 mmol/L (ref 22–32)
CREATININE: 0.65 mg/dL (ref 0.44–1.00)
GFR calc Af Amer: >60 mL/min (ref >60)
GFR calc non Af Amer: >60 mL/min (ref >60)
Glucose, Bld: 123 mg/dL — ABNORMAL HIGH (ref 65–99)
Potassium: 3.7 mmol/L (ref 3.5–5.1)
Sodium: 140 mmol/L (ref 135–145)

## 2016-02-20 LAB — CBC
HCT: 37.9 % (ref 36.0–46.0)
Hemoglobin: 12.1 g/dL (ref 12.0–15.0)
MCH: 26.7 pg (ref 26.0–34.0)
MCHC: 31.9 g/dL (ref 30.0–36.0)
MCV: 83.5 fL (ref 78.0–100.0)
Platelets: 362 10*3/uL (ref 150–400)
RBC: 4.54 MIL/uL (ref 3.87–5.11)
RDW: 15.5 % (ref 11.5–15.5)
WBC: 7.4 10*3/uL (ref 4.0–10.5)

## 2016-02-20 LAB — SURGICAL PCR SCREEN
MRSA, PCR: NEGATIVE
Staphylococcus aureus: NEGATIVE

## 2016-02-20 NOTE — Progress Notes (Signed)
   02/20/16 1303  OBSTRUCTIVE SLEEP APNEA  Have you ever been diagnosed with sleep apnea through a sleep study? No  Do you snore loudly (loud enough to be heard through closed doors)?  1  Do you often feel tired, fatigued, or sleepy during the daytime (such as falling asleep during driving or talking to someone)? 0  Has anyone observed you stop breathing during your sleep? 0  Do you have, or are you being treated for high blood pressure? 1  BMI more than 35 kg/m2? 1  Age > 50 (1-yes) 1  Neck circumference greater than:Female 16 inches or larger, Female 17inches or larger? 1  Female Gender (Yes=1) 0  Obstructive Sleep Apnea Score 5  Score 5 or greater  Results sent to PCP

## 2016-02-20 NOTE — Progress Notes (Signed)
Pt. Has a vague report of "dye being shot up in me to look for blockages". Pt. Reports that it was normal.  Pt. Had echo - 2015.  Pt. Denies ever being told that she needs to f/u with a cardiologist for any reason.  Pt. Reports that she is followed by Johny Shears in [facility]. Pt. Reports that her sleep study proved neg. For apneic spells but she hasn't been tested in several yrs. So a new screening done at this visit was routed to Dr.Manning.

## 2016-02-20 NOTE — Pre-Procedure Instructions (Signed)
[name]  02/20/2016      [id number] - [place], [place] - [place] [address] Phone: [phone] Fax: [phone]  [facility] - [place], [place] AT [address] [place] [address] Phone: [phone] Fax: [phone]  [place], [place] [address] Phone: [phone] Fax: [phone]    Your procedure is scheduled on   03/04/2016  Report to [facility] Admitting at 5:303 A.M.  Call this number if you have problems the morning of surgery:  [phone]   Remember:  Do not eat food or drink liquids after midnight.  On Monday night 03/03/2016   Take these medicines the morning of surgery with A SIP OF WATER:  If needed you may take Xanax, Baclofen  & /or Oxycodone      Do not wear jewelry, make-up or nail polish.   Do not wear lotions, powders, or perfumes.  You may wear deodorant.   Do not shave 48 hours prior to surgery.     Do not bring valuables to the hospital.   [facility] is not responsible for any belongings or valuables.  Contacts, dentures or bridgework may not be worn into surgery.  Leave your suitcase in the car.  After surgery it may be brought to your room.  For patients admitted to the hospital, discharge time will be determined by your treatment team.  Patients discharged the day of surgery will not be allowed to drive home.   Name and phone number of your driver:   With family  Special instructions:  Special Instructions: [facility] - Preparing for Surgery  Before surgery, you can play an important role.  Because skin is not sterile, your skin needs to be as free of germs as possible.  You can reduce the number of germs on you skin by washing with CHG (chlorahexidine gluconate) soap before surgery.   CHG is an antiseptic cleaner which kills germs and bonds with the skin to continue killing germs even after washing.  Please DO NOT use if you have an allergy to CHG or antibacterial soaps.  If your skin becomes reddened/irritated stop using the CHG and inform your nurse when you arrive at Short Stay.  Do not shave (including legs and underarms) for at least 48 hours prior to the first CHG shower.  You may shave your face.  Please follow these instructions carefully:   1.  Shower with CHG Soap the night before surgery and the  morning of Surgery.  2.  If you choose to wash your hair, wash your hair first as usual with your  normal shampoo.  3.  After you shampoo, rinse your hair and body thoroughly to remove the  Shampoo.  4.  Use CHG as you would any other liquid soap.  You can apply chg directly to the skin and wash gently with scrungie or a clean washcloth.  5.  Apply the CHG Soap to your body ONLY FROM THE NECK DOWN.    Do not use on open wounds or open sores.  Avoid contact with your eyes, ears, mouth and genitals (private parts).  Wash genitals (private parts)   with your normal soap.  6.  Wash thoroughly, paying special attention to the area where your  surgery will be performed.  7.  Thoroughly rinse your body with warm water from the neck down.  8.  DO NOT shower/wash with your normal soap after using and rinsing off   the CHG Soap.  9.  Pat yourself dry with a clean towel.            10.  Wear clean pajamas.            11.  Place clean sheets on your bed the night of your first shower and do not sleep with pets.  Day of Surgery  Do not apply any lotions/deodorants the morning of surgery.  Please wear clean clothes to the hospital/surgery center.  Please read over the following fact sheets that you were given. Pain Booklet, Coughing and Deep Breathing, MRSA Information and Surgical Site Infection Prevention

## 2016-02-25 ENCOUNTER — Other Ambulatory Visit: Payer: Self-pay | Admitting: Registered Nurse

## 2016-03-03 MED ORDER — DEXTROSE 5 % IV SOLN
3.0000 g | INTRAVENOUS | Status: AC
  Administered 2016-03-04: 3 g via INTRAVENOUS
  Filled 2016-03-03: qty 3000

## 2016-03-03 NOTE — Anesthesia Preprocedure Evaluation (Addendum)
Anesthesia Evaluation  Patient identified by MRN, date of birth, ID band Patient awake    Reviewed: Allergy & Precautions, NPO status , Patient's Chart, lab work & pertinent test results  Airway Mallampati: IV  TM Distance: >3 FB Neck ROM: Full    Dental  (+) Poor Dentition   Pulmonary neg pulmonary ROS, COPD, Current Smoker,    Pulmonary exam normal breath sounds clear to auscultation       Cardiovascular hypertension, Pt. on medications Normal cardiovascular exam Rhythm:Regular Rate:Normal     Neuro/Psych negative neurological ROS  negative psych ROS   GI/Hepatic Neg liver ROS, PUD, GERD  ,  Endo/Other  Morbid obesity  Renal/GU negative Renal ROS     Musculoskeletal negative musculoskeletal ROS (+) Arthritis , Fibromyalgia -  Abdominal (+) + obese,   Peds  Hematology negative hematology ROS (+)   Anesthesia Other Findings She is a grade 1 view with a glidescope Mac 3 blade, potentially a difficult DL view  Reproductive/Obstetrics negative OB ROS                          Anesthesia Physical  Anesthesia Plan  ASA: III  Anesthesia Plan: Regional and General   Post-op Pain Management: GA combined w/ Regional for post-op pain   Induction: Intravenous  Airway Management Planned: Simple Face Mask  Additional Equipment:   Intra-op Plan:   Post-operative Plan:   Informed Consent: I have reviewed the patients History and Physical, chart, labs and discussed the procedure including the risks, benefits and alternatives for the proposed anesthesia with the patient or authorized representative who has indicated his/her understanding and acceptance.   Dental advisory given  Plan Discussed with: CRNA  Anesthesia Plan Comments: (She declines spinal due to spinal stenosis and chronic back pain, super morbid obesity, continues to smoke Grade 1 view with glidescope, Mac 3 blade Will give  ketamine given chronic pain, low dose )      Anesthesia Quick Evaluation

## 2016-03-04 ENCOUNTER — Inpatient Hospital Stay (HOSPITAL_COMMUNITY): Payer: Medicare HMO | Admitting: Anesthesiology

## 2016-03-04 ENCOUNTER — Encounter (HOSPITAL_COMMUNITY)
Admission: RE | Disposition: A | Discharge status: Skilled Nursing Facility | Payer: Self-pay | Source: Ambulatory Visit | Attending: Orthopedic Surgery

## 2016-03-04 ENCOUNTER — Encounter (HOSPITAL_COMMUNITY): Payer: Self-pay | Admitting: Certified Registered Nurse Anesthetist

## 2016-03-04 ENCOUNTER — Inpatient Hospital Stay (HOSPITAL_COMMUNITY): Payer: Medicare HMO

## 2016-03-04 ENCOUNTER — Inpatient Hospital Stay (HOSPITAL_COMMUNITY)
Admission: RE | Admit: 2016-03-04 | Discharge: 2016-03-12 | DRG: 469 | Disposition: A | Discharge status: Skilled Nursing Facility | Payer: Medicare HMO | Source: Ambulatory Visit | Attending: Orthopedic Surgery | Admitting: Orthopedic Surgery

## 2016-03-04 DIAGNOSIS — F411 Generalized anxiety disorder: Secondary | ICD-10-CM | POA: Diagnosis not present

## 2016-03-04 DIAGNOSIS — F419 Anxiety disorder, unspecified: Secondary | ICD-10-CM | POA: Diagnosis present

## 2016-03-04 DIAGNOSIS — Z6841 Body Mass Index (BMI) 40.0 and over, adult: Secondary | ICD-10-CM

## 2016-03-04 DIAGNOSIS — J439 Emphysema, unspecified: Secondary | ICD-10-CM | POA: Diagnosis not present

## 2016-03-04 DIAGNOSIS — M1712 Unilateral primary osteoarthritis, left knee: Secondary | ICD-10-CM | POA: Diagnosis present

## 2016-03-04 DIAGNOSIS — D62 Acute posthemorrhagic anemia: Secondary | ICD-10-CM | POA: Diagnosis not present

## 2016-03-04 DIAGNOSIS — I471 Supraventricular tachycardia, unspecified: Secondary | ICD-10-CM

## 2016-03-04 DIAGNOSIS — F319 Bipolar disorder, unspecified: Secondary | ICD-10-CM | POA: Diagnosis present

## 2016-03-04 DIAGNOSIS — N179 Acute kidney failure, unspecified: Secondary | ICD-10-CM | POA: Diagnosis present

## 2016-03-04 DIAGNOSIS — Z90710 Acquired absence of both cervix and uterus: Secondary | ICD-10-CM | POA: Diagnosis not present

## 2016-03-04 DIAGNOSIS — J449 Chronic obstructive pulmonary disease, unspecified: Secondary | ICD-10-CM | POA: Diagnosis present

## 2016-03-04 DIAGNOSIS — J9811 Atelectasis: Secondary | ICD-10-CM | POA: Diagnosis not present

## 2016-03-04 DIAGNOSIS — I10 Essential (primary) hypertension: Secondary | ICD-10-CM | POA: Diagnosis present

## 2016-03-04 DIAGNOSIS — Z888 Allergy status to other drugs, medicaments and biological substances status: Secondary | ICD-10-CM

## 2016-03-04 DIAGNOSIS — T883XXA Malignant hyperthermia due to anesthesia, initial encounter: Secondary | ICD-10-CM | POA: Diagnosis not present

## 2016-03-04 DIAGNOSIS — G8929 Other chronic pain: Secondary | ICD-10-CM | POA: Diagnosis present

## 2016-03-04 DIAGNOSIS — I951 Orthostatic hypotension: Secondary | ICD-10-CM | POA: Diagnosis not present

## 2016-03-04 DIAGNOSIS — Z7982 Long term (current) use of aspirin: Secondary | ICD-10-CM | POA: Diagnosis not present

## 2016-03-04 DIAGNOSIS — Y838 Other surgical procedures as the cause of abnormal reaction of the patient, or of later complication, without mention of misadventure at the time of the procedure: Secondary | ICD-10-CM | POA: Diagnosis not present

## 2016-03-04 DIAGNOSIS — Z886 Allergy status to analgesic agent status: Secondary | ICD-10-CM

## 2016-03-04 DIAGNOSIS — R000 Tachycardia, unspecified: Secondary | ICD-10-CM

## 2016-03-04 DIAGNOSIS — M19012 Primary osteoarthritis, left shoulder: Secondary | ICD-10-CM | POA: Diagnosis present

## 2016-03-04 DIAGNOSIS — Z833 Family history of diabetes mellitus: Secondary | ICD-10-CM | POA: Diagnosis not present

## 2016-03-04 DIAGNOSIS — Z96659 Presence of unspecified artificial knee joint: Secondary | ICD-10-CM

## 2016-03-04 DIAGNOSIS — M797 Fibromyalgia: Secondary | ICD-10-CM | POA: Diagnosis present

## 2016-03-04 DIAGNOSIS — K210 Gastro-esophageal reflux disease with esophagitis: Secondary | ICD-10-CM | POA: Diagnosis not present

## 2016-03-04 DIAGNOSIS — J9601 Acute respiratory failure with hypoxia: Secondary | ICD-10-CM | POA: Diagnosis not present

## 2016-03-04 DIAGNOSIS — Z79899 Other long term (current) drug therapy: Secondary | ICD-10-CM | POA: Diagnosis not present

## 2016-03-04 DIAGNOSIS — R0602 Shortness of breath: Secondary | ICD-10-CM

## 2016-03-04 DIAGNOSIS — J9589 Other postprocedural complications and disorders of respiratory system, not elsewhere classified: Secondary | ICD-10-CM | POA: Diagnosis not present

## 2016-03-04 DIAGNOSIS — K219 Gastro-esophageal reflux disease without esophagitis: Secondary | ICD-10-CM

## 2016-03-04 DIAGNOSIS — Z8249 Family history of ischemic heart disease and other diseases of the circulatory system: Secondary | ICD-10-CM

## 2016-03-04 DIAGNOSIS — Z8542 Personal history of malignant neoplasm of other parts of uterus: Secondary | ICD-10-CM

## 2016-03-04 DIAGNOSIS — I959 Hypotension, unspecified: Secondary | ICD-10-CM | POA: Diagnosis not present

## 2016-03-04 DIAGNOSIS — J811 Chronic pulmonary edema: Secondary | ICD-10-CM

## 2016-03-04 DIAGNOSIS — F17210 Nicotine dependence, cigarettes, uncomplicated: Secondary | ICD-10-CM | POA: Diagnosis present

## 2016-03-04 DIAGNOSIS — R079 Chest pain, unspecified: Secondary | ICD-10-CM | POA: Diagnosis not present

## 2016-03-04 DIAGNOSIS — D649 Anemia, unspecified: Secondary | ICD-10-CM

## 2016-03-04 DIAGNOSIS — I9589 Other hypotension: Secondary | ICD-10-CM | POA: Diagnosis not present

## 2016-03-04 DIAGNOSIS — Z96653 Presence of artificial knee joint, bilateral: Secondary | ICD-10-CM

## 2016-03-04 HISTORY — DX: Unilateral primary osteoarthritis, left knee: M17.12

## 2016-03-04 HISTORY — DX: Malignant hyperthermia due to anesthesia, initial encounter: T88.3XXA

## 2016-03-04 HISTORY — PX: TOTAL KNEE ARTHROPLASTY: SHX125

## 2016-03-04 LAB — ABO/RH: ABO/RH(D): O POS

## 2016-03-04 LAB — POCT I-STAT 4, (NA,K, GLUC, HGB,HCT)
GLUCOSE: 132 mg/dL — AB (ref 65–99)
HEMATOCRIT: 34 % — AB (ref 36.0–46.0)
HEMOGLOBIN: 11.6 g/dL — AB (ref 12.0–15.0)
POTASSIUM: 4.1 mmol/L (ref 3.5–5.1)
SODIUM: 139 mmol/L (ref 135–145)

## 2016-03-04 LAB — HEMOGLOBIN AND HEMATOCRIT, BLOOD
HEMATOCRIT: 28.4 % — AB (ref 36.0–46.0)
Hemoglobin: 8.8 g/dL — ABNORMAL LOW (ref 12.0–15.0)

## 2016-03-04 SURGERY — ARTHROPLASTY, KNEE, TOTAL
Anesthesia: Regional | Site: Knee | Laterality: Left

## 2016-03-04 MED ORDER — SODIUM CHLORIDE 0.9 % IJ SOLN
INTRAMUSCULAR | Status: AC
  Filled 2016-03-04: qty 20

## 2016-03-04 MED ORDER — BUPIVACAINE HCL (PF) 0.25 % IJ SOLN
INTRAMUSCULAR | Status: DC | PRN
  Administered 2016-03-04: 30 mL

## 2016-03-04 MED ORDER — METOCLOPRAMIDE HCL 5 MG/ML IJ SOLN: 5.0000 mg | Freq: Three times a day (TID) | INTRAMUSCULAR | Status: DC | PRN

## 2016-03-04 MED ORDER — AMITRIPTYLINE HCL 50 MG PO TABS: 50.0000 mg | ORAL_TABLET | Freq: Every day | ORAL | Status: DC

## 2016-03-04 MED ORDER — VECURONIUM BROMIDE 10 MG IV SOLR
INTRAVENOUS | Status: AC
  Filled 2016-03-04: qty 20

## 2016-03-04 MED ORDER — ACETAMINOPHEN 650 MG RE SUPP
650.0000 mg | Freq: Four times a day (QID) | RECTAL | Status: DC | PRN
  Administered 2016-03-05: 650 mg via RECTAL
  Filled 2016-03-04: qty 1

## 2016-03-04 MED ORDER — METHOCARBAMOL 1000 MG/10ML IJ SOLN
500.0000 mg | Freq: Four times a day (QID) | INTRAMUSCULAR | Status: DC | PRN
  Filled 2016-03-04: qty 5

## 2016-03-04 MED ORDER — OXYCODONE HCL 15 MG PO TABS: 15.0000 mg | ORAL_TABLET | ORAL | Status: DC | PRN

## 2016-03-04 MED ORDER — METOCLOPRAMIDE HCL 5 MG PO TABS
5.0000 mg | ORAL_TABLET | Freq: Three times a day (TID) | ORAL | Status: DC | PRN
  Filled 2016-03-04: qty 2

## 2016-03-04 MED ORDER — SODIUM CHLORIDE 0.9 % IR SOLN
Status: DC | PRN
  Administered 2016-03-04: 1000 mL

## 2016-03-04 MED ORDER — BACLOFEN 10 MG PO TABS
10.0000 mg | ORAL_TABLET | Freq: Three times a day (TID) | ORAL | Status: DC | PRN
  Administered 2016-03-04 – 2016-03-05 (×2): 10 mg via ORAL
  Filled 2016-03-04 (×3): qty 1

## 2016-03-04 MED ORDER — PROPOFOL 10 MG/ML IV BOLUS
INTRAVENOUS | Status: DC | PRN
  Administered 2016-03-04: 200 mg via INTRAVENOUS

## 2016-03-04 MED ORDER — KETAMINE HCL 100 MG/ML IJ SOLN
INTRAMUSCULAR | Status: DC | PRN
Start: 2016-03-04 — End: 2016-03-04
  Administered 2016-03-04: 70 mg via INTRAVENOUS

## 2016-03-04 MED ORDER — OXYCODONE HCL 5 MG/5ML PO SOLN: 5.0000 mg | Freq: Once | ORAL | Status: DC | PRN

## 2016-03-04 MED ORDER — 0.9 % SODIUM CHLORIDE (POUR BTL) OPTIME
TOPICAL | Status: DC | PRN
  Administered 2016-03-04: 1000 mL

## 2016-03-04 MED ORDER — OXYCODONE HCL 5 MG PO TABS
15.0000 mg | ORAL_TABLET | ORAL | Status: DC | PRN
  Administered 2016-03-04 – 2016-03-12 (×14): 15 mg via ORAL
  Filled 2016-03-04 (×14): qty 3

## 2016-03-04 MED ORDER — NITROGLYCERIN 0.4 MG SL SUBL
0.4000 mg | SUBLINGUAL_TABLET | SUBLINGUAL | Status: DC | PRN
Start: 2016-03-04 — End: 2016-03-04

## 2016-03-04 MED ORDER — MORPHINE SULFATE (PF) 2 MG/ML IV SOLN
2.0000 mg | INTRAVENOUS | Status: DC | PRN
  Administered 2016-03-04 (×3): 2 mg via INTRAVENOUS

## 2016-03-04 MED ORDER — FENTANYL CITRATE (PF) 250 MCG/5ML IJ SOLN
INTRAMUSCULAR | Status: AC
  Filled 2016-03-04: qty 5

## 2016-03-04 MED ORDER — LIDOCAINE 5 % EX PTCH: 1.0000 | MEDICATED_PATCH | Freq: Two times a day (BID) | CUTANEOUS | Status: DC

## 2016-03-04 MED ORDER — ALBUTEROL SULFATE (2.5 MG/3ML) 0.083% IN NEBU: 2.5000 mg | INHALATION_SOLUTION | Freq: Four times a day (QID) | RESPIRATORY_TRACT | Status: DC | PRN

## 2016-03-04 MED ORDER — ACETAMINOPHEN 325 MG PO TABS
650.0000 mg | ORAL_TABLET | Freq: Four times a day (QID) | ORAL | Status: DC | PRN
  Administered 2016-03-05 – 2016-03-09 (×4): 650 mg via ORAL
  Filled 2016-03-04 (×4): qty 2

## 2016-03-04 MED ORDER — VECURONIUM BROMIDE 10 MG IV SOLR
INTRAVENOUS | Status: DC | PRN
  Administered 2016-03-04: 2 mg via INTRAVENOUS
  Administered 2016-03-04: 3 mg via INTRAVENOUS
  Administered 2016-03-04: 5 mg via INTRAVENOUS

## 2016-03-04 MED ORDER — MORPHINE SULFATE (PF) 2 MG/ML IV SOLN
INTRAVENOUS | Status: AC
  Administered 2016-03-04: 2 mg via INTRAVENOUS
  Filled 2016-03-04: qty 1

## 2016-03-04 MED ORDER — POLYETHYLENE GLYCOL 3350 17 G PO PACK: 17.0000 g | PACK | Freq: Every day | ORAL | Status: DC | PRN

## 2016-03-04 MED ORDER — MENTHOL 3 MG MT LOZG: 1.0000 | LOZENGE | OROMUCOSAL | Status: DC | PRN

## 2016-03-04 MED ORDER — BISACODYL 10 MG RE SUPP: 10.0000 mg | Freq: Every day | RECTAL | Status: DC | PRN

## 2016-03-04 MED ORDER — CEFAZOLIN SODIUM-DEXTROSE 2-4 GM/100ML-% IV SOLN
2.0000 g | Freq: Four times a day (QID) | INTRAVENOUS | Status: AC
  Administered 2016-03-04 (×2): 2 g via INTRAVENOUS
  Filled 2016-03-04 (×2): qty 100

## 2016-03-04 MED ORDER — OXYCODONE HCL ER 10 MG PO T12A
10.0000 mg | EXTENDED_RELEASE_TABLET | Freq: Two times a day (BID) | ORAL | Status: DC
  Administered 2016-03-04 – 2016-03-12 (×16): 10 mg via ORAL
  Filled 2016-03-04 (×16): qty 1

## 2016-03-04 MED ORDER — AMITRIPTYLINE HCL 50 MG PO TABS
50.0000 mg | ORAL_TABLET | Freq: Every day | ORAL | Status: DC
  Administered 2016-03-04: 50 mg via ORAL
  Filled 2016-03-04: qty 2

## 2016-03-04 MED ORDER — DULOXETINE HCL 60 MG PO CPEP
60.0000 mg | ORAL_CAPSULE | Freq: Every day | ORAL | Status: DC
  Administered 2016-03-04 – 2016-03-12 (×9): 60 mg via ORAL
  Filled 2016-03-04 (×9): qty 1

## 2016-03-04 MED ORDER — HYDROMORPHONE HCL 1 MG/ML IJ SOLN
INTRAMUSCULAR | Status: AC
  Filled 2016-03-04: qty 2

## 2016-03-04 MED ORDER — OXYCODONE HCL 5 MG PO TABS: 20.0000 mg | ORAL_TABLET | Freq: Once | ORAL | Status: DC | PRN

## 2016-03-04 MED ORDER — MORPHINE SULFATE (PF) 4 MG/ML IV SOLN
INTRAVENOUS | Status: AC
  Filled 2016-03-04: qty 3

## 2016-03-04 MED ORDER — ONDANSETRON HCL 4 MG PO TABS: 4.0000 mg | ORAL_TABLET | Freq: Four times a day (QID) | ORAL | Status: DC | PRN

## 2016-03-04 MED ORDER — OXYCODONE HCL ER 15 MG PO T12A: 15.0000 mg | EXTENDED_RELEASE_TABLET | Freq: Two times a day (BID) | ORAL | Status: DC

## 2016-03-04 MED ORDER — BUPIVACAINE HCL (PF) 0.25 % IJ SOLN
INTRAMUSCULAR | Status: AC
  Filled 2016-03-04: qty 30

## 2016-03-04 MED ORDER — HYDROMORPHONE HCL 2 MG PO TABS: 2.0000 mg | ORAL_TABLET | ORAL | Status: DC | PRN

## 2016-03-04 MED ORDER — DIVALPROEX SODIUM ER 500 MG PO TB24
1000.0000 mg | ORAL_TABLET | Freq: Every evening | ORAL | Status: DC
  Administered 2016-03-04 – 2016-03-11 (×7): 1000 mg via ORAL
  Filled 2016-03-04 (×10): qty 2

## 2016-03-04 MED ORDER — POTASSIUM CHLORIDE IN NACL 20-0.45 MEQ/L-% IV SOLN
INTRAVENOUS | Status: DC
  Administered 2016-03-04: 12:00:00 via INTRAVENOUS
  Filled 2016-03-04 (×3): qty 1000

## 2016-03-04 MED ORDER — SENNA-DOCUSATE SODIUM 8.6-50 MG PO TABS: 2.0000 | ORAL_TABLET | Freq: Every day | ORAL | Status: DC

## 2016-03-04 MED ORDER — METHOCARBAMOL 500 MG PO TABS
ORAL_TABLET | ORAL | Status: AC
  Filled 2016-03-04: qty 1

## 2016-03-04 MED ORDER — SENNA 8.6 MG PO TABS
1.0000 | ORAL_TABLET | Freq: Two times a day (BID) | ORAL | Status: DC
  Administered 2016-03-04 – 2016-03-12 (×17): 8.6 mg via ORAL
  Filled 2016-03-04 (×17): qty 1

## 2016-03-04 MED ORDER — FAMOTIDINE 20 MG PO TABS
20.0000 mg | ORAL_TABLET | Freq: Two times a day (BID) | ORAL | Status: DC
  Administered 2016-03-04 – 2016-03-12 (×17): 20 mg via ORAL
  Filled 2016-03-04 (×17): qty 1

## 2016-03-04 MED ORDER — DOCUSATE SODIUM 100 MG PO CAPS
100.0000 mg | ORAL_CAPSULE | Freq: Two times a day (BID) | ORAL | Status: DC
  Administered 2016-03-04 – 2016-03-12 (×17): 100 mg via ORAL
  Filled 2016-03-04 (×17): qty 1

## 2016-03-04 MED ORDER — ONDANSETRON HCL 4 MG/2ML IJ SOLN: 4.0000 mg | Freq: Four times a day (QID) | INTRAMUSCULAR | Status: DC | PRN

## 2016-03-04 MED ORDER — ALUM & MAG HYDROXIDE-SIMETH 200-200-20 MG/5ML PO SUSP: 30.0000 mL | ORAL | Status: DC | PRN

## 2016-03-04 MED ORDER — PHENOL 1.4 % MT LIQD: 1.0000 | OROMUCOSAL | Status: DC | PRN

## 2016-03-04 MED ORDER — SUGAMMADEX SODIUM 500 MG/5ML IV SOLN
INTRAVENOUS | Status: DC | PRN
  Administered 2016-03-04: 263 mg via INTRAVENOUS

## 2016-03-04 MED ORDER — METOCLOPRAMIDE HCL 5 MG/ML IJ SOLN: 10.0000 mg | Freq: Once | INTRAMUSCULAR | Status: DC | PRN

## 2016-03-04 MED ORDER — METHOCARBAMOL 500 MG PO TABS
500.0000 mg | ORAL_TABLET | Freq: Four times a day (QID) | ORAL | Status: DC | PRN
  Administered 2016-03-04 – 2016-03-12 (×4): 500 mg via ORAL
  Filled 2016-03-04 (×4): qty 1

## 2016-03-04 MED ORDER — KETAMINE HCL 100 MG/ML IJ SOLN
INTRAMUSCULAR | Status: AC
  Filled 2016-03-04: qty 1

## 2016-03-04 MED ORDER — ALPRAZOLAM 0.5 MG PO TABS
1.0000 mg | ORAL_TABLET | Freq: Three times a day (TID) | ORAL | Status: DC
  Administered 2016-03-04 – 2016-03-12 (×25): 1 mg via ORAL
  Filled 2016-03-04 (×25): qty 2

## 2016-03-04 MED ORDER — DEXAMETHASONE SODIUM PHOSPHATE 10 MG/ML IJ SOLN
10.0000 mg | Freq: Once | INTRAMUSCULAR | Status: AC
  Administered 2016-03-05: 10 mg via INTRAVENOUS
  Filled 2016-03-04: qty 1

## 2016-03-04 MED ORDER — ESMOLOL HCL 100 MG/10ML IV SOLN
INTRAVENOUS | Status: AC
  Filled 2016-03-04: qty 10

## 2016-03-04 MED ORDER — ONDANSETRON HCL 4 MG/2ML IJ SOLN
INTRAMUSCULAR | Status: DC | PRN
  Administered 2016-03-04: 4 mg via INTRAVENOUS

## 2016-03-04 MED ORDER — MAGNESIUM CITRATE PO SOLN: 1.0000 | Freq: Once | ORAL | Status: DC | PRN

## 2016-03-04 MED ORDER — FENTANYL CITRATE (PF) 100 MCG/2ML IJ SOLN
INTRAMUSCULAR | Status: DC | PRN
  Administered 2016-03-04 (×10): 50 ug via INTRAVENOUS

## 2016-03-04 MED ORDER — LACTATED RINGERS IV SOLN
INTRAVENOUS | Status: DC | PRN
  Administered 2016-03-04: 09:00:00 via INTRAVENOUS

## 2016-03-04 MED ORDER — SODIUM CHLORIDE 0.9 % IV BOLUS (SEPSIS)
500.0000 mL | Freq: Once | INTRAVENOUS | Status: AC
  Administered 2016-03-04: 500 mL via INTRAVENOUS

## 2016-03-04 MED ORDER — MIDAZOLAM HCL 2 MG/2ML IJ SOLN
INTRAMUSCULAR | Status: AC
  Filled 2016-03-04: qty 2

## 2016-03-04 MED ORDER — OXYCODONE HCL 5 MG PO TABS
ORAL_TABLET | ORAL | Status: AC
  Filled 2016-03-04: qty 4

## 2016-03-04 MED ORDER — HYDROCHLOROTHIAZIDE 25 MG PO TABS
12.5000 mg | ORAL_TABLET | Freq: Every day | ORAL | Status: DC
  Administered 2016-03-04: 12.5 mg via ORAL
  Filled 2016-03-04: qty 1

## 2016-03-04 MED ORDER — LACTATED RINGERS IV SOLN
INTRAVENOUS | Status: DC | PRN
  Administered 2016-03-04 (×2): via INTRAVENOUS

## 2016-03-04 MED ORDER — RIVAROXABAN 10 MG PO TABS: 10.0000 mg | ORAL_TABLET | Freq: Every day | ORAL | Status: DC

## 2016-03-04 MED ORDER — LIDOCAINE HCL (CARDIAC) 20 MG/ML IV SOLN
INTRAVENOUS | Status: DC | PRN
  Administered 2016-03-04: 80 mg via INTRAVENOUS

## 2016-03-04 MED ORDER — HYDROMORPHONE HCL 1 MG/ML IJ SOLN
INTRAMUSCULAR | Status: AC
  Filled 2016-03-04: qty 1

## 2016-03-04 MED ORDER — HYDROMORPHONE HCL 1 MG/ML IJ SOLN
1.0000 mg | INTRAMUSCULAR | Status: DC | PRN
  Filled 2016-03-04: qty 1

## 2016-03-04 MED ORDER — OXYCODONE HCL 5 MG PO TABS
20.0000 mg | ORAL_TABLET | Freq: Once | ORAL | Status: AC | PRN
  Administered 2016-03-04: 20 mg via ORAL

## 2016-03-04 MED ORDER — LIDOCAINE 2% (20 MG/ML) 5 ML SYRINGE
INTRAMUSCULAR | Status: AC
  Filled 2016-03-04: qty 10

## 2016-03-04 MED ORDER — MIDAZOLAM HCL 5 MG/5ML IJ SOLN
INTRAMUSCULAR | Status: DC | PRN
  Administered 2016-03-04: 2 mg via INTRAVENOUS

## 2016-03-04 MED ORDER — PROPOFOL 10 MG/ML IV BOLUS
INTRAVENOUS | Status: AC
  Filled 2016-03-04: qty 40

## 2016-03-04 MED ORDER — DIPHENHYDRAMINE HCL 12.5 MG/5ML PO ELIX
12.5000 mg | ORAL_SOLUTION | ORAL | Status: DC | PRN
  Filled 2016-03-04: qty 10

## 2016-03-04 MED ORDER — OXYCODONE HCL 5 MG/5ML PO SOLN: 20.0000 mg | Freq: Once | ORAL | Status: AC | PRN

## 2016-03-04 MED ORDER — ONDANSETRON HCL 4 MG/2ML IJ SOLN
INTRAMUSCULAR | Status: AC
  Filled 2016-03-04: qty 4

## 2016-03-04 MED ORDER — SUCCINYLCHOLINE CHLORIDE 20 MG/ML IJ SOLN
INTRAMUSCULAR | Status: DC | PRN
  Administered 2016-03-04: 140 mg via INTRAVENOUS

## 2016-03-04 MED ORDER — RIVAROXABAN 10 MG PO TABS
10.0000 mg | ORAL_TABLET | Freq: Every day | ORAL | Status: DC
  Administered 2016-03-05 – 2016-03-12 (×8): 10 mg via ORAL
  Filled 2016-03-04 (×8): qty 1

## 2016-03-04 MED ORDER — ASPIRIN 81 MG PO CHEW
81.0000 mg | CHEWABLE_TABLET | Freq: Every day | ORAL | Status: DC
  Administered 2016-03-04 – 2016-03-11 (×8): 81 mg via ORAL
  Filled 2016-03-04 (×8): qty 1

## 2016-03-04 MED ORDER — HYDROMORPHONE HCL 1 MG/ML IJ SOLN: 0.2500 mg | INTRAMUSCULAR | Status: DC | PRN

## 2016-03-04 MED ORDER — ROPIVACAINE HCL 5 MG/ML IJ SOLN
INTRAMUSCULAR | Status: DC | PRN
  Administered 2016-03-04: 30 mL via PERINEURAL

## 2016-03-04 SURGICAL SUPPLY — 74 items
APL SKNCLS STERI-STRIP NONHPOA (GAUZE/BANDAGES/DRESSINGS) ×1
BANDAGE ELASTIC 6 VELCRO ST LF (GAUZE/BANDAGES/DRESSINGS) ×6 IMPLANT
BANDAGE ESMARK 6X9 LF (GAUZE/BANDAGES/DRESSINGS) ×1 IMPLANT
BENZOIN TINCTURE PRP APPL 2/3 (GAUZE/BANDAGES/DRESSINGS) ×3 IMPLANT
BLADE SAG 18X100X1.27 (BLADE) ×3 IMPLANT
BLADE SAW RECIP 87.9 MT (BLADE) ×3 IMPLANT
BLADE SAW SGTL 13X75X1.27 (BLADE) ×3 IMPLANT
BLADE SURG 10 STRL SS (BLADE) ×4 IMPLANT
BNDG CMPR 9X6 STRL LF SNTH (GAUZE/BANDAGES/DRESSINGS) ×1
BNDG CMPR MED 10X6 ELC LF (GAUZE/BANDAGES/DRESSINGS) ×1
BNDG ELASTIC 6X10 VLCR STRL LF (GAUZE/BANDAGES/DRESSINGS) ×2 IMPLANT
BNDG ESMARK 6X9 LF (GAUZE/BANDAGES/DRESSINGS) ×3
BOOTCOVER CLEANROOM LRG (PROTECTIVE WEAR) ×6 IMPLANT
BOWL SMART MIX CTS (DISPOSABLE) ×3 IMPLANT
CAPT KNEE TOTAL 3 ×2 IMPLANT
CEMENT HV SMART SET (Cement) ×6 IMPLANT
CLOSURE STERI-STRIP 1/2X4 (GAUZE/BANDAGES/DRESSINGS) ×1
CLSR STERI-STRIP ANTIMIC 1/2X4 (GAUZE/BANDAGES/DRESSINGS) ×2 IMPLANT
COVER SURGICAL LIGHT HANDLE (MISCELLANEOUS) ×3 IMPLANT
CUFF TOURNIQUET SINGLE 34IN LL (TOURNIQUET CUFF) ×3 IMPLANT
DRAPE EXTREMITY T 121X128X90 (DRAPE) ×3 IMPLANT
DRAPE U-SHAPE 47X51 STRL (DRAPES) ×3 IMPLANT
DRSG PAD ABDOMINAL 8X10 ST (GAUZE/BANDAGES/DRESSINGS) ×2 IMPLANT
DURAPREP 26ML APPLICATOR (WOUND CARE) ×3 IMPLANT
ELECT CAUTERY BLADE 6.4 (BLADE) ×3 IMPLANT
ELECT REM PT RETURN 9FT ADLT (ELECTROSURGICAL) ×3
ELECTRODE REM PT RTRN 9FT ADLT (ELECTROSURGICAL) ×1 IMPLANT
FACESHIELD STD STERILE (MASK) ×3 IMPLANT
GAUZE SPONGE 4X4 12PLY STRL (GAUZE/BANDAGES/DRESSINGS) ×3 IMPLANT
GLOVE BIOGEL PI IND STRL 8 (GLOVE) ×1 IMPLANT
GLOVE BIOGEL PI INDICATOR 8 (GLOVE) ×2
GLOVE BIOGEL PI ORTHO PRO SZ8 (GLOVE) ×2
GLOVE ORTHO TXT STRL SZ7.5 (GLOVE) ×3 IMPLANT
GLOVE PI ORTHO PRO STRL SZ8 (GLOVE) ×1 IMPLANT
GLOVE SURG ORTHO 8.0 STRL STRW (GLOVE) ×3 IMPLANT
GOWN STRL REUS W/ TWL LRG LVL3 (GOWN DISPOSABLE) IMPLANT
GOWN STRL REUS W/ TWL XL LVL3 (GOWN DISPOSABLE) ×1 IMPLANT
GOWN STRL REUS W/TWL 2XL LVL3 (GOWN DISPOSABLE) ×3 IMPLANT
GOWN STRL REUS W/TWL LRG LVL3 (GOWN DISPOSABLE) ×3
GOWN STRL REUS W/TWL XL LVL3 (GOWN DISPOSABLE) ×6
HANDPIECE INTERPULSE COAX TIP (DISPOSABLE) ×3
HOOD PEEL AWAY FACE SHEILD DIS (HOOD) ×10 IMPLANT
IMMOBILIZER KNEE 22 (SOFTGOODS) ×3 IMPLANT
IMMOBILIZER KNEE 22 UNIV (SOFTGOODS) ×2 IMPLANT
KIT BASIN OR (CUSTOM PROCEDURE TRAY) ×3 IMPLANT
KIT ROOM TURNOVER OR (KITS) ×3 IMPLANT
MANIFOLD NEPTUNE II (INSTRUMENTS) ×3 IMPLANT
NDL 18GX1X1/2 (RX/OR ONLY) (NEEDLE) ×1 IMPLANT
NEEDLE 18GX1X1/2 (RX/OR ONLY) (NEEDLE) ×3 IMPLANT
NS IRRIG 1000ML POUR BTL (IV SOLUTION) ×3 IMPLANT
PACK TOTAL JOINT (CUSTOM PROCEDURE TRAY) ×3 IMPLANT
PACK UNIVERSAL I (CUSTOM PROCEDURE TRAY) ×3 IMPLANT
PAD ABD 8X10 STRL (GAUZE/BANDAGES/DRESSINGS) ×3 IMPLANT
PAD ARMBOARD 7.5X6 YLW CONV (MISCELLANEOUS) ×6 IMPLANT
PAD CAST 4YDX4 CTTN HI CHSV (CAST SUPPLIES) ×1 IMPLANT
PADDING CAST COTTON 4X4 STRL (CAST SUPPLIES) ×3
PADDING CAST COTTON 6X4 STRL (CAST SUPPLIES) ×5 IMPLANT
SET HNDPC FAN SPRY TIP SCT (DISPOSABLE) ×1 IMPLANT
SPONGE GAUZE 4X4 12PLY STER LF (GAUZE/BANDAGES/DRESSINGS) ×2 IMPLANT
SUCTION FRAZIER HANDLE 10FR (MISCELLANEOUS) ×2
SUCTION TUBE FRAZIER 10FR DISP (MISCELLANEOUS) ×1 IMPLANT
SUT MNCRL AB 4-0 PS2 18 (SUTURE) IMPLANT
SUT VIC AB 0 CT1 27 (SUTURE) ×3
SUT VIC AB 0 CT1 27XBRD ANBCTR (SUTURE) ×1 IMPLANT
SUT VIC AB 2-0 CT1 27 (SUTURE) ×3
SUT VIC AB 2-0 CT1 TAPERPNT 27 (SUTURE) ×1 IMPLANT
SUT VIC AB 3-0 SH 8-18 (SUTURE) ×6 IMPLANT
SYR 30ML LL (SYRINGE) IMPLANT
SYR 50ML LL SCALE MARK (SYRINGE) ×3 IMPLANT
TOWEL OR 17X24 6PK STRL BLUE (TOWEL DISPOSABLE) ×3 IMPLANT
TOWEL OR 17X26 10 PK STRL BLUE (TOWEL DISPOSABLE) ×3 IMPLANT
TRAY CATH 16FR W/PLASTIC CATH (SET/KITS/TRAYS/PACK) IMPLANT
WATER STERILE IRR 1000ML POUR (IV SOLUTION) ×6 IMPLANT
YANKAUER SUCT BULB TIP NO VENT (SUCTIONS) ×2 IMPLANT

## 2016-03-04 NOTE — H&P (Signed)
PREOPERATIVE H&P  Chief Complaint: left knee DJD  HPI: [name] is a 50 y.o. female who presents for preoperative history and physical with a diagnosis of left knee DJD. Symptoms are rated as moderate to severe, and have been worsening.  This is significantly impairing activities of daily living.  She has elected for surgical management.   She has failed injections, activity modification, anti-inflammatories, and assistive devices.  Preoperative X-rays demonstrate end stage degenerative changes with osteophyte formation, loss of joint space, subchondral sclerosis.   Past Medical History  Diagnosis Date  . Fibromyalgia   . Bipolar 1 disorder ([place])   . Spinal stenosis   . Hypertension   . Obesity   . GERD (gastroesophageal reflux disease)   . PUD (peptic ulcer disease)   . Anxiety   . COPD (chronic obstructive pulmonary disease) ([place])   . Pneumonia     developed in 07/2015- post shoulder replacement   . Depression   . Osteoarthritis     knees, elbows   . 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 Cancer [facility])     uterine CA/- resulted in hysterectomy, pt. reports that she is cured    Past Surgical History  Procedure Laterality Date  . Abdominal hysterectomy    . Arthroscopic knee    . Cardiovascular stress test  03/2010    Lexiscan Myoview: EF 55%, mild breast attenuation, no ischemia or scar  . Transthoracic echocardiogram  03/2010    EF 50-55%, normal LV size, no WMAs  . Tonsillectomy    . Total shoulder arthroplasty Left 07/17/2015    Procedure: TOTAL SHOULDER ARTHROPLASTY;  Surgeon: Marchia Bond, MD;  Location: [place];  Service: Orthopedics;  Laterality: Left;  . Tubal ligation    . Joint replacement      L shoulder    Social History   Social History  . Marital Status: Divorced    Spouse Name: N/A  . Number of Children: N/A  . Years of Education: N/A   Social History Main Topics  . Smoking status: Current Every Day Smoker -- 0.50 packs/day    Types:  Cigarettes  . Smokeless tobacco: Never Used  . Alcohol Use: No  . Drug Use: No     Comment: goes to pain clinic  . Sexual Activity: Not Asked   Other Topics Concern  . None   Social History Narrative   Family History  Problem Relation Age of Onset  . Diabetes Other   . Hypertension Other   . Schizophrenia Mother   . Hypertension Mother   . Stroke Father    Allergies  Allergen Reactions  . Abilify [Aripiprazole] Other (See Comments)    Causes body shaking and tremors  . Latuda [Lurasidone Hcl] Other (See Comments) and Nausea And Vomiting    Causes body shaking and tremors EPS. She is intolerant to all AAP in this way Causes body shaking and tremors  . Naproxen Anaphylaxis  . Wellbutrin [Bupropion] Other (See Comments)    Shakes and jerking EPS    Prior to Admission medications   Medication Sig Start Date End Date Taking? Authorizing Provider  albuterol (PROAIR HFA) 108 (90 Base) MCG/ACT inhaler Inhale 2 puffs into the lungs every 6 (six) hours as needed for wheezing or shortness of breath.  10/04/15 10/03/16 Yes Historical Provider, MD  ALPRAZolam Duanne Moron) 1 MG tablet Take 1 mg by mouth 3 (three) times daily.  10/30/14  Yes Historical Provider, MD  amitriptyline (ELAVIL) 50 MG tablet Take  50 mg by mouth at bedtime.  08/29/15  Yes Historical Provider, MD  amitriptyline (ELAVIL) 50 MG tablet TAKE 1 TABLET BY MOUTH EVERY NIGHT AT BEDTIME 02/25/16  Yes Bayard Hugger, NP  aspirin 81 MG chewable tablet Chew 81 mg by mouth at bedtime.    Yes Historical Provider, MD  baclofen (LIORESAL) 10 MG tablet Take 1 tablet (10 mg total) by mouth 3 (three) times daily as needed for muscle spasms. 07/17/15  Yes Marchia Bond, MD  divalproex (DEPAKOTE ER) 500 MG 24 hr tablet Take 1,000 mg by mouth every evening.    Yes Historical Provider, MD  DULoxetine (CYMBALTA) 60 MG capsule TAKE ONE CAPSULE BY MOUTH DAILY Patient taking differently: TAKE ONE CAPSULE BY MOUTH DAILY    at bedtime 08/29/15  Yes  Bayard Hugger, NP  hydrochlorothiazide (HYDRODIURIL) 12.5 MG tablet Take 12.5 mg by mouth daily.  06/19/15  Yes Historical Provider, MD  lidocaine (LIDODERM) 5 % Place 1 patch onto the skin every 12 (twelve) hours. Remove & Discard patch within 12 hours or as directed 10/29/15  Yes Bayard Hugger, NP  nitroGLYCERIN (NITROSTAT) 0.4 MG SL tablet Place 0.4 mg under the tongue every 5 (five) minutes as needed for chest pain.  06/11/15  Yes Historical Provider, MD  oxyCODONE (ROXICODONE) 15 MG immediate release tablet Take 1 tablet (15 mg total) by mouth 3 (three) times daily. Patient taking differently: Take 15 mg by mouth every 8 (eight) hours as needed for pain.  02/05/16  Yes Bayard Hugger, NP  polyethylene glycol (MIRALAX / GLYCOLAX) packet Take 17 g by mouth daily as needed for mild constipation.   Yes Historical Provider, MD  ranitidine (ZANTAC) 150 MG tablet Take 150 mg by mouth 2 (two) times daily as needed for heartburn.   Yes Historical Provider, MD     Positive ROS: All other systems have been reviewed and were otherwise negative with the exception of those mentioned in the HPI and as above.  Physical Exam:  Estimated body mass index is 53.03 kg/(m^2) as calculated from the following:   Height as of this encounter: 5\' 2"  (1.575 m).   Weight as of this encounter: 131.543 kg (290 lb).  General: Alert, no acute distress Cardiovascular: No pedal edema Respiratory: No cyanosis, no use of accessory musculature GI: No organomegaly, abdomen is soft and non-tender Skin: No lesions in the area of chief complaint Neurologic: Sensation intact distally Psychiatric: Patient is competent for consent with normal mood and affect Lymphatic: No axillary or cervical lymphadenopathy  MUSCULOSKELETAL: left knee rom 3-120 degrees, significant crepitance, positive effusion  Assessment: left knee DJD   Plan: Plan for Procedure(s): TOTAL KNEE ARTHROPLASTY  The risks benefits and alternatives were  discussed with the patient including but not limited to the risks of nonoperative treatment, versus surgical intervention including infection, bleeding, nerve injury,  blood clots, cardiopulmonary complications, morbidity, mortality, among others, and they were willing to proceed.   Johnny Bridge, MD Cell [phone]   03/04/2016 7:19 AM

## 2016-03-04 NOTE — Anesthesia Procedure Notes (Addendum)
Anesthesia Regional Block:  Femoral nerve block  Pre-Anesthetic Checklist: ,, timeout performed, Correct Patient, Correct Site, Correct Laterality, Correct Procedure, Correct Position, site marked, Risks and benefits discussed,  Surgical consent,  Pre-op evaluation,  At surgeon's request and post-op pain management  Laterality: Left  Prep: chloraprep       Needles:  Injection technique: Single-shot  Needle Type: Stimiplex     Needle Length: 9cm 9 cm Needle Gauge: 21 and 21 G    Additional Needles:  Procedures: ultrasound guided (picture in chart) Femoral nerve block Narrative:  Injection made incrementally with aspirations every 5 mL.  Performed by: Personally  Anesthesiologist: JUDD, BENJAMIN  Additional Notes: Risks, benefits and alternative to block explained extensively.  Patient tolerated procedure well, without complications. Minimal sedation used, no complaints of parasthesia or pain on injection. No increased pressure with injection.   Procedure Name: Intubation Date/Time: 03/04/2016 7:39 AM Performed by: Bethel Born Pre-anesthesia Checklist: Patient identified, Timeout performed, Emergency Drugs available, Suction available and Patient being monitored Patient Re-evaluated:Patient Re-evaluated prior to inductionPreoxygenation: Pre-oxygenation with 100% oxygen Intubation Type: IV induction Ventilation: Oral airway inserted - appropriate to patient size and Mask ventilation without difficulty Laryngoscope Size: Glidescope and 4 Grade View: Grade I Tube type: Oral Tube size: 7.0 mm Number of attempts: 1 Airway Equipment and Method: Video-laryngoscopy and Stylet Placement Confirmation: ETT inserted through vocal cords under direct vision,  breath sounds checked- equal and bilateral and positive ETCO2 Secured at: 22 cm Tube secured with: Tape Dental Injury: Teeth and Oropharynx as per pre-operative assessment  Difficulty Due To: Difficult Airway- due  to large tongue and Difficulty was anticipated

## 2016-03-04 NOTE — Transfer of Care (Signed)
Immediate Anesthesia Transfer of Care Note  Patient: [name]  Procedure(s) Performed: Procedure(s): TOTAL KNEE ARTHROPLASTY (Left)  Patient Location: PACU  Anesthesia Type:GA combined with regional for post-op pain  Level of Consciousness: awake, alert , oriented and patient cooperative  Airway & Oxygen Therapy: Patient Spontanous Breathing and Patient connected to nasal cannula oxygen  Post-op Assessment: Report given to RN and Post -op Vital signs reviewed and stable  Post vital signs: Reviewed and stable  Last Vitals:  Filed Vitals:   03/04/16 0633 03/04/16 1026  BP: 113/72 124/86  Pulse: 87 121  Temp: 36.7 C 36.8 C  Resp: 20 16    Last Pain:  Filed Vitals:   03/04/16 1033  PainSc: 10-Worst pain ever         Complications: No apparent anesthesia complications

## 2016-03-04 NOTE — Evaluation (Signed)
Physical Therapy Evaluation Patient Details Name: [name] MRN: [id number] DOB: 1966/08/25 Today's Date: 03/04/2016   History of Present Illness  Patient is a 50 y/o female s/p L TKA.  PMH includes fibromyalgia, bipolar, SS, HTN, obesity, anxiety, COPD, TSA 07/2015, GERD, PUD, COPD.  Clinical Impression  Patient presents with decreased independence with mobility due to deficits listed in PT problem list below and will benefit from skilled PT in the acute setting to allow return home with intermittent family support and follow up HHPT.    Follow Up Recommendations Home health PT;Supervision - Intermittent    Equipment Recommendations  Rolling walker with 5" wheels;3in1 (PT)    Recommendations for Other Services       Precautions / Restrictions Precautions Precautions: Fall Required Braces or Orthoses: Knee Immobilizer - Left Restrictions LLE Weight Bearing: Weight bearing as tolerated      Mobility  Bed Mobility Overal bed mobility: Needs Assistance Bed Mobility: Supine to Sit;Sit to Supine     Supine to sit: Min assist Sit to supine: Min assist   General bed mobility comments: A for L LE, cues for technique  Transfers Overall transfer level: Needs assistance Equipment used: Rolling walker (2 wheeled) Transfers: Sit to/from Stand Sit to Stand: Min assist;Mod assist         General transfer comment: from bed and 3:1 assist for lifting and cues for technique  Ambulation/Gait Ambulation/Gait assistance: Min assist Ambulation Distance (Feet): 12 Feet Assistive device: Rolling walker (2 wheeled) Gait Pattern/deviations: Step-to pattern;Decreased stride length;Antalgic;Trunk flexed;Wide base of support     General Gait Details: limited tolerance to weight on L LE with ambulation, did not make it all they way to toilet in bathroom so brought BSC, however pt unable to void  Stairs            Wheelchair Mobility    Modified Rankin (Stroke Patients  Only)       Balance Overall balance assessment: Needs assistance   Sitting balance-Leahy Scale: Good     Standing balance support: Bilateral upper extremity supported Standing balance-Leahy Scale: Poor Standing balance comment: heavy UE support for balance due to painful L LE                             Pertinent Vitals/Pain Pain Assessment: 0-10 Pain Score: 6  Pain Location: L knee Pain Descriptors / Indicators: Operative site guarding Pain Intervention(s): Monitored during session;Limited activity within patient's tolerance;Repositioned    Home Living Family/patient expects to be discharged to:: Private residence Living Arrangements: Non-relatives/Friends (roomate) Available Help at Discharge: Family;Available PRN/intermittently (daughter can help for a few hours during the day) Type of Home: House Home Access: Stairs to enter Entrance Stairs-Rails: Psychiatric nurse of Steps: 1 Home Layout: One level Home Equipment: None      Prior Function Level of Independence: Independent               Hand Dominance   Dominant Hand: Right    Extremity/Trunk Assessment   Upper Extremity Assessment: Overall WFL for tasks assessed           Lower Extremity Assessment: LLE deficits/detail   LLE Deficits / Details: AAROM knee flexion about 35 degrees, extension -10 degrees, active quad set, ankle AROM WFL     Communication   Communication: No difficulties  Cognition Arousal/Alertness: Lethargic;Suspect due to medications (awake, but easily falls asleep) Behavior During Therapy: [facility] for tasks assessed/performed Overall Cognitive  Status: Within Functional Limits for tasks assessed                      General Comments General comments (skin integrity, edema, etc.): Patient reported already up and sat in chair earlier today; back in bed upon my entry and initially only agreed to bed exercises, then reported had to use bathroom     Exercises Total Joint Exercises Ankle Circles/Pumps: AROM;Both;10 reps;Supine Quad Sets: AROM;Left;5 reps;Supine Heel Slides: AAROM;Left;5 reps;Supine      Assessment/Plan    PT Assessment    PT Diagnosis Acute pain;Generalized weakness;Difficulty walking   PT Problem List    PT Treatment Interventions     PT Goals (Current goals can be found in the Care Plan section) Acute Rehab PT Goals Patient Stated Goal: To go home  PT Goal Formulation: With patient Time For Goal Achievement: 03/11/16 Potential to Achieve Goals: Good    Frequency     Barriers to discharge        Co-evaluation               End of Session Equipment Utilized During Treatment: Gait belt;Left knee immobilizer Activity Tolerance: Patient limited by lethargy;Patient limited by pain Patient left: in bed;with call bell/phone within reach;with bed alarm set           Time: [id number] PT Time Calculation (min) (ACUTE ONLY): 33 min   Charges:   PT Evaluation $PT Eval Moderate Complexity: 1 Procedure PT Treatments $Gait Training: 8-22 mins   PT G Codes:        Reginia Naas 03-28-16, 4:37 PM Magda Kiel, [place] 03-28-2016

## 2016-03-04 NOTE — Progress Notes (Signed)
Assessed pt was tachycardic on the continuous pulse ox. Checked vitals and saw pt's BP was low and HR was still elevated. Called Dr. Mardelle Matte with an update and received orders to administer a 500cc bolus of NS as well as to redraw an H&H and if the pt's Hgb is below 8.0 to give 2 units of blood. Pt currently resting in bed--Will continue to monitor    03/04/16 1714  Vitals  BP (!) 84/70 mmHg  BP Location Left Arm  BP Method Automatic  Patient Position (if appropriate) Sitting  Pulse Rate (!) 122  Pulse Rate Source Dinamap  Resp 16  Oxygen Therapy  SpO2 97 %  O2 Device Room [employer]

## 2016-03-04 NOTE — Anesthesia Postprocedure Evaluation (Signed)
Anesthesia Post Note  Patient: [name]  Procedure(s) Performed: Procedure(s) (LRB): TOTAL KNEE ARTHROPLASTY (Left)  Patient location during evaluation: PACU Anesthesia Type: General and Regional Level of consciousness: awake and alert Pain management: pain level controlled Vital Signs Assessment: post-procedure vital signs reviewed and stable Respiratory status: spontaneous breathing, nonlabored ventilation, respiratory function stable and patient connected to nasal cannula oxygen Cardiovascular status: blood pressure returned to baseline and stable Postop Assessment: no signs of nausea or vomiting Anesthetic complications: no    Last Vitals:  Filed Vitals:   03/04/16 1100 03/04/16 1115  BP: 102/63   Pulse: 110 112  Temp:    Resp: 14 19    Last Pain:  Filed Vitals:   03/04/16 1117  PainSc: 8                  Zenaida Deed

## 2016-03-04 NOTE — Op Note (Signed)
DATE OF SURGERY:  03/04/2016 TIME: 9:46 AM  PATIENT NAME:  [name]   AGE: 50 y.o.    PRE-OPERATIVE DIAGNOSIS:  Left knee primary localized osteoarthritis  POST-OPERATIVE DIAGNOSIS:  Same  PROCEDURE:  Procedure(s): TOTAL KNEE ARTHROPLASTY   SURGEON:  Johnny Bridge, MD   ASSISTANT:  Joya Gaskins, OPA-C, present and scrubbed throughout the case, critical for assistance with exposure, retraction, instrumentation, and closure.   OPERATIVE IMPLANTS: Biomet Vanguard size 70 mm femur, posterior stabilized, with 2 distal femoral fixation pegs, with a size 75 mm tibial base plate with a single interlocking bar and a size 10 mm polyethylene insert with a size 34 mm patellar button.   PREOPERATIVE INDICATIONS:  [name] is a 50 y.o. year old female with end stage bone on bone degenerative arthritis of the knee who failed conservative treatment, including injections, antiinflammatories, activity modification, and assistive devices, and had significant impairment of their activities of daily living, and elected for Total Knee Arthroplasty.   The risks, benefits, and alternatives were discussed at length including but not limited to the risks of infection, bleeding, nerve injury, stiffness, blood clots, the need for revision surgery, cardiopulmonary complications, among others, and they were willing to proceed.  OPERATIVE FINDINGS AND UNIQUE ASPECTS OF THE CASE:  She was morbidly obese with a BMI of 52 and a tourniquet was not effective because it was effectively a venous tourniquet. This was released after 25 minutes during the approach. The patella was extremely small, measuring 19 mm, at its thickest, and 11 mm at its thinnest before the cut. After the cut I was approximately 13 mm at its thickest, and 11 mm at its thinnest on the most lateral side.  OPERATIVE DESCRIPTION:  The patient was brought to the operative room and placed in a supine position.  General anesthesia was  administered.  IV antibiotics were given.  The lower extremity was prepped and draped in the usual sterile fashion.  Time out was performed.  The leg was elevated and exsanguinated and the tourniquet was inflated.  Anterior quadriceps tendon splitting approach was performed.  The patella was everted and osteophytes were removed.  The anterior horn of the medial and lateral meniscus was removed.   The distal femur was opened with the drill and the intramedullary distal femoral cutting jig was utilized, set at 5 degrees resecting 9 mm off the distal femur.  Care was taken to protect the collateral ligaments.  Then the extramedullary tibial cutting jig was utilized making the appropriate cut using the anterior tibial crest as a reference building in appropriate posterior slope.  Care was taken during the cut to protect the medial and collateral ligaments.  The proximal tibia was removed along with the posterior horns of the menisci.  The PCL was sacrificed.    The extensor gap was measured and was approximately 40mm.  hemostasis was not well maintained with the tourniquet because of her morbid obesity and so the tourniquet was released at this point, and geniculate vessels coagulated with the cautery.  The distal femoral sizing jig was applied, taking care to avoid notching.  Then the 4-in-1 cutting jig was applied and the anterior and posterior femur was cut, along with the chamfer cuts.  All posterior osteophytes were removed.  The flexion gap was then measured and was symmetric with the extension gap.  I completed the distal femoral preparation using the appropriate jig to prepare the box.  The patella was then measured, and cut  with the saw.  The thickness before the cut was 19 and after the cut was 13.  The proximal tibia sized and prepared accordingly with the reamer and the punch, and then all components were trialed with the 8mm poly insert.  The knee was found to have excellent balance and  full motion.    The above named components were then cemented into place and all excess cement was removed.  The real polyethylene implant was placed.    The knee was easily taken through a range of motion and the patella tracked well and the knee irrigated copiously and the parapatellar and subcutaneous tissue closed with vicryl, and monocryl with steri strips for the skin.  The wounds were injected with marcaine, and dressed with sterile gauze and the patient was awakened and returned to the PACU in stable and satisfactory condition.  There were no complications.  Total tourniquet time was 25 minutes.

## 2016-03-05 ENCOUNTER — Inpatient Hospital Stay (HOSPITAL_COMMUNITY): Payer: Medicare HMO

## 2016-03-05 ENCOUNTER — Encounter: Payer: Self-pay | Admitting: Gastroenterology

## 2016-03-05 ENCOUNTER — Encounter (HOSPITAL_COMMUNITY): Payer: Self-pay | Admitting: Orthopedic Surgery

## 2016-03-05 DIAGNOSIS — R000 Tachycardia, unspecified: Secondary | ICD-10-CM

## 2016-03-05 DIAGNOSIS — I959 Hypotension, unspecified: Secondary | ICD-10-CM

## 2016-03-05 DIAGNOSIS — J439 Emphysema, unspecified: Secondary | ICD-10-CM

## 2016-03-05 DIAGNOSIS — M1712 Unilateral primary osteoarthritis, left knee: Principal | ICD-10-CM

## 2016-03-05 DIAGNOSIS — J9811 Atelectasis: Secondary | ICD-10-CM

## 2016-03-05 DIAGNOSIS — I471 Supraventricular tachycardia: Secondary | ICD-10-CM

## 2016-03-05 DIAGNOSIS — R079 Chest pain, unspecified: Secondary | ICD-10-CM

## 2016-03-05 DIAGNOSIS — K219 Gastro-esophageal reflux disease without esophagitis: Secondary | ICD-10-CM

## 2016-03-05 DIAGNOSIS — D649 Anemia, unspecified: Secondary | ICD-10-CM

## 2016-03-05 LAB — CBC
HCT: 25.9 % — ABNORMAL LOW (ref 36.0–46.0)
Hemoglobin: 8.1 g/dL — ABNORMAL LOW (ref 12.0–15.0)
MCH: 25.6 pg — ABNORMAL LOW (ref 26.0–34.0)
MCHC: 31.3 g/dL (ref 30.0–36.0)
MCV: 82 fL (ref 78.0–100.0)
PLATELETS: 229 10*3/uL (ref 150–400)
RBC: 3.16 MIL/uL — ABNORMAL LOW (ref 3.87–5.11)
RDW: 15.2 % (ref 11.5–15.5)
WBC: 4.3 10*3/uL (ref 4.0–10.5)

## 2016-03-05 LAB — COMPREHENSIVE METABOLIC PANEL
ALT: 13 U/L — ABNORMAL LOW (ref 14–54)
ANION GAP: 9 (ref 5–15)
AST: 30 U/L (ref 15–41)
Albumin: 2.6 g/dL — ABNORMAL LOW (ref 3.5–5.0)
Alkaline Phosphatase: 37 U/L — ABNORMAL LOW (ref 38–126)
BUN: 9 mg/dL (ref 6–20)
CHLORIDE: 98 mmol/L — AB (ref 101–111)
CO2: 26 mmol/L (ref 22–32)
CREATININE: 1.01 mg/dL — AB (ref 0.44–1.00)
Calcium: 8.3 mg/dL — ABNORMAL LOW (ref 8.9–10.3)
Glucose, Bld: 184 mg/dL — ABNORMAL HIGH (ref 65–99)
POTASSIUM: 4 mmol/L (ref 3.5–5.1)
SODIUM: 133 mmol/L — AB (ref 135–145)
Total Bilirubin: 0.3 mg/dL (ref 0.3–1.2)
Total Protein: 5.8 g/dL — ABNORMAL LOW (ref 6.5–8.1)

## 2016-03-05 LAB — URINALYSIS, ROUTINE W REFLEX MICROSCOPIC
BILIRUBIN URINE: NEGATIVE
Glucose, UA: NEGATIVE mg/dL
Hgb urine dipstick: NEGATIVE
Ketones, ur: NEGATIVE mg/dL
LEUKOCYTES UA: NEGATIVE
NITRITE: NEGATIVE
Protein, ur: NEGATIVE mg/dL
SPECIFIC GRAVITY, URINE: 1.04 — AB (ref 1.005–1.030)
pH: 5.5 (ref 5.0–8.0)

## 2016-03-05 LAB — BASIC METABOLIC PANEL
ANION GAP: 7 (ref 5–15)
BUN: 6 mg/dL (ref 6–20)
CHLORIDE: 99 mmol/L — AB (ref 101–111)
CO2: 28 mmol/L (ref 22–32)
Calcium: 8.1 mg/dL — ABNORMAL LOW (ref 8.9–10.3)
Creatinine, Ser: 0.77 mg/dL (ref 0.44–1.00)
GFR calc Af Amer: >60 mL/min (ref >60)
GLUCOSE: 180 mg/dL — AB (ref 65–99)
POTASSIUM: 3.9 mmol/L (ref 3.5–5.1)
Sodium: 134 mmol/L — ABNORMAL LOW (ref 135–145)

## 2016-03-05 LAB — ECHOCARDIOGRAM COMPLETE
Height: 62 in
WEIGHTICAEL: 4688 [oz_av]

## 2016-03-05 LAB — MAGNESIUM: Magnesium: 1.8 mg/dL (ref 1.7–2.4)

## 2016-03-05 LAB — LACTIC ACID, PLASMA
LACTIC ACID, VENOUS: 3.6 mmol/L — AB (ref 0.5–2.0)
LACTIC ACID, VENOUS: 2.8 mmol/L — AB (ref 0.5–2.0)

## 2016-03-05 LAB — CK: CK TOTAL: 102 U/L (ref 38–234)

## 2016-03-05 LAB — TROPONIN I: Troponin I: 0.03 ng/mL (ref <0.031)

## 2016-03-05 LAB — T4, FREE: Free T4: 0.68 ng/dL (ref 0.61–1.12)

## 2016-03-05 LAB — PROCALCITONIN: PROCALCITONIN: 0.3 ng/mL

## 2016-03-05 LAB — TSH: TSH: 1.014 u[IU]/mL (ref 0.350–4.500)

## 2016-03-05 LAB — AMYLASE: Amylase: 61 U/L (ref 28–100)

## 2016-03-05 LAB — GLUCOSE, CAPILLARY: Glucose-Capillary: 138 mg/dL — ABNORMAL HIGH (ref 65–99)

## 2016-03-05 LAB — LIPASE, BLOOD: LIPASE: 98 U/L — AB (ref 11–51)

## 2016-03-05 MED ORDER — SODIUM CHLORIDE 0.9 % IV BOLUS (SEPSIS)
500.0000 mL | Freq: Once | INTRAVENOUS | Status: AC
  Administered 2016-03-05: 500 mL via INTRAVENOUS

## 2016-03-05 MED ORDER — VANCOMYCIN HCL 10 G IV SOLR
2000.0000 mg | Freq: Once | INTRAVENOUS | Status: AC
  Administered 2016-03-05: 2000 mg via INTRAVENOUS
  Filled 2016-03-05: qty 2000

## 2016-03-05 MED ORDER — SODIUM CHLORIDE 0.9 % IV SOLN
INTRAVENOUS | Status: DC
  Administered 2016-03-05: 06:00:00 via INTRAVENOUS

## 2016-03-05 MED ORDER — LEVALBUTEROL HCL 0.63 MG/3ML IN NEBU: 0.6300 mg | INHALATION_SOLUTION | RESPIRATORY_TRACT | Status: DC | PRN

## 2016-03-05 MED ORDER — VANCOMYCIN HCL IN DEXTROSE 1-5 GM/200ML-% IV SOLN
1000.0000 mg | Freq: Two times a day (BID) | INTRAVENOUS | Status: DC
  Administered 2016-03-05 – 2016-03-06 (×2): 1000 mg via INTRAVENOUS
  Filled 2016-03-05 (×3): qty 200

## 2016-03-05 MED ORDER — IPRATROPIUM BROMIDE 0.02 % IN SOLN
0.5000 mg | Freq: Four times a day (QID) | RESPIRATORY_TRACT | Status: DC
  Administered 2016-03-05 – 2016-03-06 (×4): 0.5 mg via RESPIRATORY_TRACT
  Filled 2016-03-05 (×5): qty 2.5

## 2016-03-05 MED ORDER — METOPROLOL TARTRATE 5 MG/5ML IV SOLN
5.0000 mg | INTRAVENOUS | Status: DC
  Filled 2016-03-05: qty 5

## 2016-03-05 MED ORDER — FENTANYL CITRATE (PF) 100 MCG/2ML IJ SOLN
25.0000 ug | INTRAMUSCULAR | Status: DC | PRN
  Administered 2016-03-08: 100 ug via INTRAVENOUS
  Filled 2016-03-05: qty 2

## 2016-03-05 MED ORDER — IOPAMIDOL (ISOVUE-370) INJECTION 76%
INTRAVENOUS | Status: AC
  Administered 2016-03-05: 100 mL
  Filled 2016-03-05: qty 100

## 2016-03-05 MED ORDER — SODIUM CHLORIDE 0.9 % IV SOLN
INTRAVENOUS | Status: DC
  Administered 2016-03-05 – 2016-03-07 (×2): via INTRAVENOUS

## 2016-03-05 MED ORDER — DEXTROSE 5 % IV SOLN
2.0000 g | Freq: Three times a day (TID) | INTRAVENOUS | Status: DC
  Administered 2016-03-05 – 2016-03-07 (×6): 2 g via INTRAVENOUS
  Filled 2016-03-05 (×10): qty 2

## 2016-03-05 MED ORDER — CETYLPYRIDINIUM CHLORIDE 0.05 % MT LIQD
7.0000 mL | Freq: Two times a day (BID) | OROMUCOSAL | Status: DC
  Administered 2016-03-05 – 2016-03-12 (×9): 7 mL via OROMUCOSAL

## 2016-03-05 MED ORDER — FENTANYL CITRATE (PF) 100 MCG/2ML IJ SOLN
INTRAMUSCULAR | Status: AC
  Filled 2016-03-05: qty 2

## 2016-03-05 MED ORDER — SODIUM CHLORIDE 0.9 % IV SOLN: INTRAVENOUS | Status: AC

## 2016-03-05 MED ORDER — BUDESONIDE 0.5 MG/2ML IN SUSP
0.5000 mg | Freq: Two times a day (BID) | RESPIRATORY_TRACT | Status: DC
  Administered 2016-03-05 – 2016-03-12 (×14): 0.5 mg via RESPIRATORY_TRACT
  Filled 2016-03-05 (×15): qty 2

## 2016-03-05 MED FILL — Medication: Qty: 1 | Status: AC

## 2016-03-05 NOTE — Code Documentation (Signed)
  Patient Name: KULSOOM BYSTROM   MRN: [id number]   Date of Birth/ Sex: 1966-03-17 , female      Admission Date: 03/04/2016  Attending Provider: Marchia Bond, MD  Primary Diagnosis: Primary localized osteoarthritis of left knee   Indication: Pt was in her usual state of health until this AM, when she was noted to be tachycardic with rate to the 240s. Code blue was subsequently called. At the time of arrival on scene, patient was alert with palpable pulse. EKG demonstrated SVT. Dr. Claiborne Billings, on call cardiology, was present and supervised management of assessment of patient's condition. Code blue was cancelled at that time. Patient received Adenosine 6 mg once without change in rhythm, 12 mg with conversion to sinus tachycardia with rate in the 150s. Blood pressure was stable. She was given IV Fentanyl 12.5 mg twice for pain.    Technical Description:  - CPR performance duration:  0  minute  - Was defibrillation or cardioversion used? No   - Was external pacer placed? No  - Was patient intubated pre/post CPR? No   Medications Administered: Y = Yes; Blank = No Amiodarone    Atropine    Calcium    Epinephrine    Lidocaine    Magnesium    Norepinephrine    Phenylephrine    Sodium bicarbonate    Vasopressin     Post CPR evaluation: No CPR performed - What is current rhythm? Sinus tachycardia - What is current hemodynamic status? Stable, but guarded  Miscellaneous Information:  - Labs sent, including: CMP, CBC, Troponin, Lactic acid, blood cx  - Primary team notified?  Yes  - Family Notified? pending  - Additional notes/ transfer status: Transfer to ICU     Zada Finders, MD  03/05/2016, 3:01 AM

## 2016-03-05 NOTE — Progress Notes (Signed)
Echocardiogram 2D Echocardiogram has been performed.  Tresa Res 03/05/2016, 11:08 AM

## 2016-03-05 NOTE — Progress Notes (Signed)
Patient has been a/o x 4. Around 0145, her continuous pulse ox was beeping, HR was in the 240's. Patient asymptomatic.  Placed patient on telemetry to double check HR. It was consistent with cpox.  Rapid response contacted, charge nurse contacted.  On call contacted. Code was called at [place].  Patient remained a/o however rectal temp of 103.9.  Patient was ultimately moved to [facility] [place].  Daughters were contacted messages left on their cell phones at 0315.  Patient was transported via bed to [facility] with Rapid response and charge nurse.  Report given to [place] on [place].  Patient's HR came down to 142 prior to transport.

## 2016-03-05 NOTE — Significant Event (Signed)
Rapid Response Event Note Called per floor RN regarding Pt with hr 220s. Advised RN to obtain EKG and page MD on call for Surgical team while RRT en route to room.   Overview: Time Called: 0143 Arrival Time: 0147 Event Type: Cardiac  Initial Focused Assessment: Pt awake alert oriented X 4, complains of pain at surgical site only, denies chest pain. Skin hot to touch. HR 230-240 SVT on tele box, BP 114/79, RR 15, Po2 93-96 on 2 LNC.   Interventions: Rectal temp done 103.9, Tylenol PR given. EKG done yielding SVT. Pt placed on Zoll pads/monitor and back board. Surgery P.A paged x 2 with call back received, P.A to consult Cardiologist in house STAT, orders received for 5 mg lopressor while awaiting Cardio.  Loss of IV access,  X 2 failed PIV attempts.  It was determined to call Code Blue after persistent tachycardia HR 240s at 0215. Cardiologist Dr.Kelly arrived with Resident lead Code team. IV access obtained.  Adenosine 6 mg given x 1 with no response, Adenosine 12 mg given x 1 with 2 second pause, followed by ST 140-150s with PVCs BP 107/58. 25 mcg fentanyl given left leg for surgical pain. 250 NS bolus infused. CXR done at bedside STAT. Lactic Acid and other labs ordered per Dr. Claiborne Billings. Pt transferred to 2 H ICU, report provided per floor RN. Care assumed by [place] RN. PCCM consulted   Event Summary: Name of Physician Notified: Josh Chadwell P.A on call for Dr. Mardelle Matte at [phone]  Name of Consulting Physician Notified: Dr. Claiborne Billings notified via P.A at    Outcome: Transferred (Comment), Coded and survived (Code called while awaiting Cardiology to bedside HR 240s)     [place], [place]

## 2016-03-05 NOTE — Consult Note (Signed)
PULMONARY / CRITICAL CARE MEDICINE   Name: [name] MRN: [id number] DOB: 01-Aug-1966    ADMISSION DATE:  03/04/2016 CONSULTATION DATE:  03/05/16  REFERRING MD:  Mardelle Matte  CHIEF COMPLAINT:  SVT  HISTORY OF PRESENT ILLNESS:  [name] is a 50 y.o. 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  Surgery went well without complication.  Post op she had tachycardia around 110 for most of the day, then the following morning, during early AM hours, she developed HR as high as 240's.  Code blue was called; however, pt never lost pulses and remained alert and oriented throughout; therefore, code cancelled. She was evaluated by rapid response team, code team, and cardiology and decision was made to administer adenosine.  She received 6mg  followed by 12mg  which resulted in a 2 second pause followed by [facility] and recurrent sinus tach.  She was transferred to the ICU and PCCM was consulted.  Of note, she had fever to 103 for which she was started on vanc.  She had CTA performed which did not demonstrate central PE.  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  PAST SURGICAL HISTORY: She  has past surgical history that includes Abdominal hysterectomy; arthroscopic knee; Cardiovascular stress test (03/2010); transthoracic echocardiogram (03/2010); Tonsillectomy; Total shoulder arthroplasty (Left, 07/17/2015); Tubal ligation; and Joint replacement.  Allergies  Allergen Reactions  . Abilify [Aripiprazole] Other (See Comments)    Causes body shaking and tremors  . Latuda [Lurasidone Hcl]  Other (See Comments) and Nausea And Vomiting    Causes body shaking and tremors EPS. She is intolerant to all AAP in this way Causes body shaking and tremors  . Naproxen Anaphylaxis  . Wellbutrin [Bupropion] Other (See Comments)    Shakes and jerking EPS     No current facility-administered medications on file prior to encounter.   Current Outpatient Prescriptions on File Prior to Encounter  Medication Sig  . albuterol (PROAIR HFA) 108 (90 Base) MCG/ACT inhaler Inhale 2 puffs into the lungs every 6 (six) hours as needed for wheezing or shortness of breath.   . ALPRAZolam (XANAX) 1 MG tablet Take 1 mg by mouth 3 (three) times daily.   Marland Kitchen aspirin 81 MG chewable tablet Chew 81 mg by mouth at bedtime.   . baclofen (LIORESAL) 10 MG tablet Take 1 tablet (10 mg total) by mouth 3 (three) times daily as needed for muscle spasms.  . divalproex (DEPAKOTE ER) 500 MG 24 hr tablet Take 1,000 mg by mouth every evening.   . DULoxetine (CYMBALTA) 60 MG capsule TAKE ONE CAPSULE BY MOUTH DAILY (Patient taking differently: TAKE ONE CAPSULE BY MOUTH DAILY    at bedtime)  . hydrochlorothiazide (HYDRODIURIL) 12.5 MG tablet Take 12.5 mg by mouth daily.   Marland Kitchen lidocaine (LIDODERM) 5 % Place 1 patch onto the skin every 12 (twelve) hours. Remove & Discard patch within 12 hours or as directed  . nitroGLYCERIN (NITROSTAT) 0.4 MG SL tablet Place 0.4 mg under the tongue every 5 (five) minutes as needed for chest pain.     FAMILY HISTORY:  Her indicated that her mother is alive. She indicated that her father is deceased.   SOCIAL HISTORY:  She  reports that she has been smoking Cigarettes.  She has been smoking about 0.50 packs per day. She has never used smokeless tobacco. She reports that she does not drink alcohol or use illicit drugs.  REVIEW OF SYSTEMS:   All negative; except for those that are bolded, which indicate positives.  Constitutional: weight loss, weight gain, night sweats, fevers, chills, fatigue,  weakness.  HEENT: headaches, sore throat, sneezing, nasal congestion, post nasal drip, difficulty swallowing, tooth/dental problems, visual complaints, visual changes, ear aches. Neuro: difficulty with speech, weakness, numbness, ataxia. CV:  chest pain, orthopnea, PND, swelling in lower extremities, dizziness, palpitations, syncope.  Resp: cough, hemoptysis, dyspnea, wheezing. GI  heartburn, indigestion, abdominal pain, nausea, vomiting, diarrhea, constipation, change in bowel habits, loss of appetite, hematemesis, melena, hematochezia.  GU: dysuria, change in color of urine, urgency or frequency, flank pain, hematuria. MSK: joint pain or swelling, decreased range of motion, left leg / knee pain. Psych: change in mood or affect, depression, anxiety, suicidal ideations, homicidal ideations. Skin: rash, itching, bruising.  SUBJECTIVE:  Complains of left knee / leg discomfort.  Otherwise, no complaints.  VITAL SIGNS: BP 106/70 mmHg  Pulse 136  Temp(Src) 103.9 F (39.9 C) (Oral)  Resp 26  Ht 5\' 2"  (1.575 m)  Wt 293 lb (132.904 kg)  BMI 53.58 kg/m2  SpO2 93%  HEMODYNAMICS:    VENTILATOR SETTINGS:    INTAKE / OUTPUT: I/O last 3 completed shifts: In: 1930 [P.O.:480; I.V.:1400; IV Piggyback:50] Out: 1300 [Blood:1300]   PHYSICAL EXAMINATION: General: Middle aged female, in NAD. Neuro: A&O x 3, non-focal.  HEENT: [place]/AT. PERRL, sclerae anicteric. Cardiovascular: Tachy, regular, no M/R/G.  Lungs: Respirations even and unlabored.  CTA bilaterally, No W/R/R. Abdomen: BS x 4, soft, NT/ND.  Musculoskeletal: Left knee dressings C/D/I, leg appropriately tender post op, no edema.  Skin: Intact, warm, no rashes.  LABS:  BMET  Recent Labs Lab 03/04/16 0859 03/05/16 0246  NA 139 133*  K 4.1 4.0  CL  --  98*  CO2  --  26  BUN  --  9  CREATININE  --  1.01*  GLUCOSE 132* 184*    Electrolytes  Recent Labs Lab 03/05/16 0246  CALCIUM 8.3*    CBC  Recent Labs Lab  03/04/16 0859 03/04/16 1813 03/05/16 0246  WBC  --   --  4.3  HGB 11.6* 8.8* 8.1*  HCT 34.0* 28.4* 25.9*  PLT  --   --  229    Coag's No results for input(s): APTT, INR in the last 168 hours.  Sepsis Markers  Recent Labs Lab 03/05/16 0246  LATICACIDVEN 3.6*    ABG No results for input(s): PHART, PCO2ART, PO2ART in the last 168 hours.  Liver Enzymes  Recent Labs Lab 03/05/16 0246  AST 30  ALT 13*  ALKPHOS 37*  BILITOT 0.3  ALBUMIN 2.6*    Cardiac Enzymes  Recent Labs Lab 03/05/16 0246  TROPONINI <0.03    Glucose No results for input(s): GLUCAP in the last 168 hours.  Imaging Dg Chest Port 1 View  03/05/2016  CLINICAL DATA:  Shortness of breath. EXAM: PORTABLE CHEST 1 VIEW COMPARISON:  Front and lateral views 02/20/2016 FINDINGS: Cardiomediastinal contours are unchanged allowing for differences in technique and positioning. No pulmonary edema, confluent consolidation, pleural effusion or pneumothorax. Linear atelectasis or scarring in the left midlung zone. Left humeral head prosthesis, partially included. IMPRESSION: No active disease. Electronically Signed   By: Jeb Levering M.D.   On: 03/05/2016 02:48  Dg Knee Left Port  03/04/2016  CLINICAL DATA:  Status post total knee replacement EXAM: PORTABLE LEFT KNEE - 1-2 VIEW COMPARISON:  10/06/2015 FINDINGS: Tricompartmental knee replacement with components in anticipated position. There is anticipated postoperative soft tissue change in the joint and anteriorly. IMPRESSION: Anticipated postoperative appearance Electronically Signed   By: Skipper Cliche M.D.   On: 03/04/2016 11:28     STUDIES:  CTA 05/24 > no central PE.  Scattered atelectasis.  Questionable peripancreatic soft tissue stranding, ? Pancreatitis. CXR 05/24 > no acute process.  CULTURES: Blood 05/24 >  ANTIBIOTICS: Vanc 05/24 >  SIGNIFICANT EVENTS: 05/23 > left TKA. 05/24 > transferred to ICU for sinus  tach.  LINES/TUBES: None.  DISCUSSION: 50 y.o. F who underwent left TKA on 05/23.  Had sinus tach post op which worsened during early AM hours the following day with HR as high as 240's.  She was given adenosine which had some effect, but pt remained in SVT so was transferred to the ICU for further monitoring.  ASSESSMENT / PLAN:  CARDIOVASCULAR A:  SVT - s/p 6mg  and 12mg  adenosine with some decrease in HR but now remains in SVT.  Likely related to fever, favored to be due to atelectasis.  CTA negative for PE. Borderline hypotension. Hx HTN, sCHF (Echo from March 2015 with EF 55-65%). P:  500cc bolus now then 500cc IVF over 1 hour. Monitor hemodynamics. Trend troponins, lactate. Continue outpatient ASA. Follow echo.  PULMONARY A: Acute hypoxic respiratory failure. Post op atelectasis. COPD by report - no PFT's in system. Tobacco dependence. P:   Continue supplemental O2 as needed to maintain SpO2 > 92%. Incentive spirometry / pulmonary hygiene. CXR intermittently. Levalbuterol PRN. Tobacco cessation counseling.  INFECTIOUS A:   Fever post op - unclear etiology at this point.  Favor atelectasis.  Doubt post op surgical infection. P:   Abx as above (vanc).  Follow cultures as above. PCT algorithm to limit abx exposure.  RENAL A:   AKI. Pseudohypocalcemia - corrects to 9.42. P:   NS @ 75. Assess ionized calcium. BMP in AM.  GASTROINTESTINAL A:   Questionable peripancreatic soft tissue stranding - ? Pancreatitis. GERD. Obesity. Nutrition. Hx PUD. P:   Assess lipase, amylase. Famotidine. NPO.  HEMATOLOGIC / ONCOLOGIC A:   Anemia. Hx uterine CA - s/p hysterectomy. VTE Prophylaxis. P:  Transfuse for Hgb < 7. SCD's. CBC in AM.  ENDOCRINE A:   No acute issues.  P:   Monitor glucose on BMP.  NEUROLOGIC A:   Post op pain. Hx fibromyalgia, bipolar 1, spinal stenosis, anxiety, depression. P:   Fentanyl PRN. Continue outpatient alprazolam,  depakote, duloxetine, amitriptyline. Hold outpatient baclofen (currently on robaxin), amitriptyline.  MUSCULOSKELETAL A: DJD - s/p left TKA 05/23 (Dr. Mardelle Matte). P: Post op care per orthopedics. Ortho to evaluate wound / operative site today.  Family updated: None available.  Interdisciplinary Family Meeting v Palliative Care Meeting:  Due by: 05/31.  CC time: 30 minutes.   Montey Hora, [place] [facility] Pager: [phone]  or [phone] 03/05/2016, 5:11 AM   ATTENDING NOTE / ATTESTATION NOTE :   I have discussed the case with the resident/APP  Rahul Desai.  I agree with the resident/APP's  history, physical examination, assessment, and plans.  I have edited the above note and modified it according to our agreed history, physical examination, assessment and plan.   Briefly, morbidly obese F admitted for  elective L TKR on 5/23.  Course c/b post op tachycardia ensuing for almost a day until her HR was in the 200s. Cardiology was consulted and adenosine was given w/o much effect. Pt was noted to be febrile at 103 and also with L knee pain. Transferred to ICU. (-) other complaints other than L knee pain. BP 106/70, HR 140  RR 20  94% on 2L.  Some bibasilar crackles. (+) BS, soft, obese, (-) tenderness. Trace edema. CTA (-) for PE, has atelectasis, possible peripancreatic edema. Sinus tachycardia thought to be 2/2 fever, post op pain, atelectasis.  Infection is being considered as well but less likely.  Continue hydration > WOF congestion. Plan for PCT > deescalate accordingly. F/U lactate. Plan for echo this am. Cycle troponin.  Keep NPO for now.   I have spent 35  minutes of critical care time with this patient today.  Family :No family at bedside. I discussed the plan with the pt and she is in agreement.    Monica Becton, MD 03/05/2016, 6:06 AM [place] [facility] Pager [phone] After 3 pm or if no answer, call  [phone]

## 2016-03-05 NOTE — Consult Note (Signed)
PULMONARY / CRITICAL CARE MEDICINE   Name: [name] MRN: [id number] DOB: [birth year]    ADMISSION DATE:  03/04/2016 CONSULTATION DATE:  03/05/16  REFERRING MD:  Mardelle Matte  CHIEF COMPLAINT:  SVT  HISTORY OF PRESENT ILLNESS:  [name] is a 50 y.o. 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  Surgery went well without complication.  Post op she had tachycardia around 110 for most of the day, then the following morning, during early AM hours, she developed HR as high as 240's.  Code blue was called; however, pt never lost pulses and remained alert and oriented throughout; therefore, code cancelled. She was evaluated by rapid response team, code team, and cardiology and decision was made to administer adenosine.  She received 6mg  followed by 12mg  which resulted in a 2 second pause followed by [facility] and recurrent sinus tach.  She was transferred to the ICU and PCCM was consulted.  Of note, she had fever to 103 for which she was started on vanc.  She had CTA performed which did not demonstrate central PE.  SUBJECTIVE:  HT 126  VITAL SIGNS: BP 109/63 mmHg  Pulse 126  Temp(Src) 103.5 F (39.7 C) (Rectal)  Resp 17  Ht 5\' 2"  (1.575 m)  Wt 132.904 kg (293 lb)  BMI 53.58 kg/m2  SpO2 94%  HEMODYNAMICS:    VENTILATOR SETTINGS:    INTAKE / OUTPUT: I/O last 3 completed shifts: In: 3505 [P.O.:480; I.V.:1475; IV Piggyback:1550] Out: 1700 [Urine:400; Blood:1300]   PHYSICAL EXAMINATION: General: Middle aged female, in NAD. Neuro: A&O x 3, non-focal.  HEENT: PERRL Cardiovascular: Tachy mild, regular, no M/R/G Lungs: CTA. Abdomen: BS x 4, soft, NT/ND.  Musculoskeletal: Left knee dressings C/D/I, leg appropriately tender post op, no edema.  Skin: Intact, warm, no rashes.  LABS:  BMET  Recent Labs Lab 03/04/16 0859 03/05/16 0246  NA 139 133*  K 4.1 4.0  CL  --  98*  CO2  --  26  BUN  --  9  CREATININE  --  1.01*  GLUCOSE 132* 184*     Electrolytes  Recent Labs Lab 03/05/16 0246  CALCIUM 8.3*    CBC  Recent Labs Lab 03/04/16 0859 03/04/16 1813 03/05/16 0246  WBC  --   --  4.3  HGB 11.6* 8.8* 8.1*  HCT 34.0* 28.4* 25.9*  PLT  --   --  229    Coag's No results for input(s): APTT, INR in the last 168 hours.  Sepsis Markers  Recent Labs Lab 03/05/16 0246 03/05/16 0530 03/05/16 0723  LATICACIDVEN 3.6* 2.8*  --   PROCALCITON  --   --  0.30    ABG No results for input(s): PHART, PCO2ART, PO2ART in the last 168 hours.  Liver Enzymes  Recent Labs Lab 03/05/16 0246  AST 30  ALT 13*  ALKPHOS 37*  BILITOT 0.3  ALBUMIN 2.6*    Cardiac Enzymes  Recent Labs Lab 03/05/16 0246 03/05/16 0723  TROPONINI <0.03 0.03    Glucose  Recent Labs Lab 03/05/16 0737  GLUCAP 138*    Imaging Ct Angio Chest Pe W/cm &/or Wo Cm  03/05/2016  CLINICAL DATA:  Tachycardia. EXAM: CT ANGIOGRAPHY CHEST WITH CONTRAST TECHNIQUE: Multidetector CT imaging of the chest was performed using the standard protocol during bolus administration of intravenous contrast. Multiplanar CT image reconstructions and MIPs were obtained to evaluate the vascular anatomy. CONTRAST:  100 cc Isovue 370 IV COMPARISON:  Radiograph earlier  this day.  Chest CT 12/26/2013 FINDINGS: Suboptimal contrast timing bolus, no pulmonary embolus to the level of the segmental branches. Subsegmental branches cannot be assessed. The thoracic aorta is normal in caliber. No aortic dissection. No mediastinal or hilar adenopathy. No pericardial effusion. Linear atelectasis within both lower lobes and dependent right upper lobe. No consolidation to suggest pneumonia. No pulmonary edema. No pleural effusion. Evaluation of the upper abdomen demonstrates hepatic steatosis. There is questionable peripancreatic edema versus motion. There are no acute or suspicious osseous abnormalities. Review of the MIP images confirms the above findings. IMPRESSION: 1. No central  pulmonary embolus.  Cannot assess subsegmental levels. 2. Scattered atelectasis in both lungs, no pneumonia. 3. Question of peripancreatic soft tissue stranding in the upper abdomen. Can be seen with acute pancreatitis. Motion artifact is felt less likely. Recommend correlation with pancreatic enzymes. Electronically Signed   By: Jeb Levering M.D.   On: 03/05/2016 05:10   Dg Chest Port 1 View  03/05/2016  CLINICAL DATA:  Shortness of breath. EXAM: PORTABLE CHEST 1 VIEW COMPARISON:  Front and lateral views 02/20/2016 FINDINGS: Cardiomediastinal contours are unchanged allowing for differences in technique and positioning. No pulmonary edema, confluent consolidation, pleural effusion or pneumothorax. Linear atelectasis or scarring in the left midlung zone. Left humeral head prosthesis, partially included. IMPRESSION: No active disease. Electronically Signed   By: Jeb Levering M.D.   On: 03/05/2016 02:48   Dg Knee Left Port  03/04/2016  CLINICAL DATA:  Status post total knee replacement EXAM: PORTABLE LEFT KNEE - 1-2 VIEW COMPARISON:  10/06/2015 FINDINGS: Tricompartmental knee replacement with components in anticipated position. There is anticipated postoperative soft tissue change in the joint and anteriorly. IMPRESSION: Anticipated postoperative appearance Electronically Signed   By: Skipper Cliche M.D.   On: 03/04/2016 11:28     STUDIES:  CTA 05/24 > no central PE.  Scattered atelectasis.  Questionable peripancreatic soft tissue stranding, ? Pancreatitis. CXR 05/24 > no acute process.  CULTURES: Blood 05/24 >  ANTIBIOTICS: Vanc 05/24 >>> ceftaz 5/24>>>  SIGNIFICANT EVENTS: 05/23 > left TKA. 05/24 > transferred to ICU for sinus tach.  LINES/TUBES: None.  DISCUSSION: 50 y.o. F who underwent left TKA on 05/23.  Had sinus tach post op which worsened during early AM hours the following day with HR as high as 240's.  She was given adenosine which had some effect, but pt remained in SVT  so was transferred to the ICU for further monitoring.  ASSESSMENT / PLAN:  CARDIOVASCULAR A:  SVT - s/p 6mg  and 12mg  adenosine with some decrease in HR but now remains in SVT.  Likely related to fever, favored to be due to atelectasis.  CTA negative for PE. Borderline hypotension Tachy from fevers Hx HTN, sCHF (Echo from March 2015 with EF 55-65%). P:  Control fevers Allow pos balance Not on pressors bp is adequate Lactic was less 4 and reduced now 5 hours from pccm evaluation, will evaluate bundle sepsis  Will still favor  20 cc/kg, on min O2, no crackles (lactic was never greater 4 and any hypotension was from svt nor sepsis) Sepsis - Repeat Assessment  Performed at:    1005 am 5/24  Vitals     Blood pressure 109/63, pulse 126, temperature 103.5 F (39.7 C), temperature source Rectal, resp. rate 17, height 5\' 2"  (1.575 m), weight 132.904 kg (293 lb), SpO2 94 %.  Heart:     Tachycardic  Lungs:    CTA  Capillary Refill:   <2  sec  Peripheral Pulse:   Radial pulse palpable  Skin:     Normal Color    PULMONARY A: Acute hypoxic respiratory failure. Post op atelectasis. COPD by report - no PFT's in system. Tobacco dependence. P:   Low O2 needs Give more volume with fevers tachy 125 pcx rin am for edema risk / atx  INFECTIOUS A:   Fever post op - unclear etiology at this point.  Favor atelectasis.  Doubt post op surgical infection. R./o aspiration intraop? Vs transient bacteremia? Unclear Drug reaction, r/o MH P:   Abx as above (vanc).  Add gram neg nosocmial coverage  PCT algorithm to limit abx exposure- may help as neg thus far UA ensure BC sent Sputum if able Will review MAR from anesthetics Get cpk tylenal  RENAL A:   AKI. Pseudohypocalcemia - corrects to 9.42. P:   NS @ 75 Bolus to 20 cc/kg - infected pt algorythm bmet now and in am   GASTROINTESTINAL A:   Questionable peripancreatic soft tissue stranding - ?  Pancreatitis. GERD. Obesity. Nutrition. Hx PUD. P:   Assess lipase, amylase - neg Famotidine. start diet   HEMATOLOGIC / ONCOLOGIC A:   Anemia. Hx uterine CA - s/p hysterectomy. VTE Prophylaxis, high risk P:  Transfuse for Hgb < 7. SCD's. Xaraelto, crt trend  ENDOCRINE A:   No acute issues.  P:   Monitor glucose on BMP.  NEUROLOGIC A:   Post op pain. Hx fibromyalgia, bipolar 1, spinal stenosis, anxiety, depression. P:   Fentanyl PRN. Continue outpatient alprazolam, depakote, duloxetine, amitriptyline. Hold outpatient baclofen (currently on robaxin), amitriptyline.  MUSCULOSKELETAL A: DJD - s/p left TKA 05/23 (Dr. Mardelle Matte). P: Post op care per orthopedics. Ortho to evaluate wound / operative site today.  Family updated: None available.  Interdisciplinary Family Meeting v Palliative Care Meeting:  Due by: 05/31.  CC time: 50 minutes.  Lavon Paganini. Titus Mould, MD, [place] Pgr: [place] [facility]

## 2016-03-05 NOTE — Progress Notes (Signed)
     Subjective:  Patient reports pain as moderate.  Transferred to the intensive care unit overnight because of supraventricular tachycardia. She also had fever to 103.9.  Objective:   VITALS:   Filed Vitals:   03/05/16 1100 03/05/16 1130 03/05/16 1200 03/05/16 1408  BP: 115/88 122/73    Pulse: 121 115    Temp:   99.9 F (37.7 C)   TempSrc:   Rectal   Resp: 16 20    Height:      Weight:      SpO2: 95% 94%  92%    Neurologically intact Sensation intact distally Dorsiflexion/Plantar flexion intact Incision: dressing C/D/I   Lab Results  Component Value Date   WBC 4.3 03/05/2016   HGB 8.1* 03/05/2016   HCT 25.9* 03/05/2016   MCV 82.0 03/05/2016   PLT 229 03/05/2016   BMET    Component Value Date/Time   NA 134* 03/05/2016 1139   K 3.9 03/05/2016 1139   CL 99* 03/05/2016 1139   CO2 28 03/05/2016 1139   GLUCOSE 180* 03/05/2016 1139   BUN 6 03/05/2016 1139   CREATININE 0.77 03/05/2016 1139   CALCIUM 8.1* 03/05/2016 1139   GFRNONAA >60 03/05/2016 1139   GFRAA >60 03/05/2016 1139     Assessment/Plan: 1 Day Post-Op   Principal Problem:   Primary localized osteoarthritis of left knee Active Problems:   S/P total knee arthroplasty   SVT (supraventricular tachycardia) (HCC)   Atelectasis   Absolute anemia   Arterial hypotension   Pulmonary emphysema (HCC)   Sinus tachycardia (HCC)   GERD (gastroesophageal reflux disease)   Advance diet Up with therapy Appreciate intensive care unit assistance in her care, the etiology of her supraventricular tachycardia is not clear. It is extremely unlikely that she has sepsis the day of elective knee surgery, and she seems to be having a major autonomic dysfunction of unclear etiology.  CT of the chest did not demonstrate pulmonary embolus, this may be simply from the severe pain, and we will continue to pursue all medical possibilities.  I'm okay for her to be out of bed when she is capable, and trying get back on the  total knee recovery protocol as soon as possible. I have written her for Xarelto already, she is okay to be anticoagulated.   Izacc Demeyer P 03/05/2016, 3:27 PM   Marchia Bond, MD Cell [phone]

## 2016-03-05 NOTE — Progress Notes (Signed)
Patient Name: [name] Date of Encounter: 03/05/2016  Hospital Problem List     Principal Problem:   Primary localized osteoarthritis of left knee Active Problems:   S/P total knee arthroplasty   SVT (supraventricular tachycardia) (HCC)   Absolute anemia   Arterial hypotension   Pulmonary emphysema (HCC)   Sinus tachycardia (HCC)   Atelectasis   GERD (gastroesophageal reflux disease)    Subjective   No chest pain or sob.  Remains in sinus tachy.  No further SVT.  Inpatient Medications    . ALPRAZolam  1 mg Oral TID  . aspirin  81 mg Oral QHS  . budesonide (PULMICORT) nebulizer solution  0.5 mg Nebulization BID  . dexamethasone  10 mg Intravenous Once  . divalproex  1,000 mg Oral QPM  . docusate sodium  100 mg Oral BID  . DULoxetine  60 mg Oral Daily  . famotidine  20 mg Oral BID  . fentaNYL      . ipratropium  0.5 mg Nebulization Q6H  . oxyCODONE  10 mg Oral Q12H  . rivaroxaban  10 mg Oral Q breakfast  . senna  1 tablet Oral BID  . vancomycin  1,000 mg Intravenous Q12H    Vital Signs    Filed Vitals:   03/05/16 0630 03/05/16 0700 03/05/16 0715 03/05/16 0745  BP: 132/108 116/68 100/59 95/51  Pulse: 128 131 131 131  Temp:    103.5 F (39.7 C)  TempSrc:    Rectal  Resp: 36 29 31 19   Height:      Weight:      SpO2: 99% 96% 99% 97%    Intake/Output Summary (Last 24 hours) at 03/05/16 0816 Last data filed at 03/05/16 0700  Gross per 24 hour  Intake   3505 ml  Output   1700 ml  Net   1805 ml   Filed Weights   03/04/16 0633 03/05/16 0330  Weight: 290 lb (131.543 kg) 293 lb (132.904 kg)    Physical Exam    General: Pleasant, NAD. Neuro: Alert and oriented X 3. Moves all extremities spontaneously. Psych: flat affect. HEENT:  Normal  Neck: Supple without bruits or JVD. Lungs:  Resp regular and unlabored, CTA. Heart: RRR no s3, s4, or murmurs. Abdomen: obese, soft, non-tender, non-distended, BS + x 4.  Extremities: No clubbing, cyanosis or  edema. DP/PT/Radials 2+ and equal bilaterally.  L leg wrapped.  Labs    CBC  Recent Labs  03/04/16 1813 03/05/16 0246  WBC  --  4.3  HGB 8.8* 8.1*  HCT 28.4* 25.9*  MCV  --  82.0  PLT  --  [id number]   Basic Metabolic Panel  Recent Labs  03/04/16 0859 03/05/16 0246  NA 139 133*  K 4.1 4.0  CL  --  98*  CO2  --  26  GLUCOSE 132* 184*  BUN  --  9  CREATININE  --  1.01*  CALCIUM  --  8.3*   Liver Function Tests  Recent Labs  03/05/16 0246  AST 30  ALT 13*  ALKPHOS 37*  BILITOT 0.3  PROT 5.8*  ALBUMIN 2.6*   Cardiac Enzymes  Recent Labs  03/05/16 0246  TROPONINI <0.03    Telemetry    Sinus tach - currently 120's.  Radiology    Dg Chest 2 View  02/20/2016  CLINICAL DATA:  Preop testing for knee arthroplasty EXAM: CHEST  2 VIEW COMPARISON:  07/18/2015 FINDINGS: Normal heart size and mediastinal contours. No  infiltrate or edema. No effusion or pneumothorax. Left glenohumeral arthroplasty with stable alignment since prior. IMPRESSION: No active cardiopulmonary disease. Electronically Signed   By: Monte Fantasia M.D.   On: 02/20/2016 14:19   Ct Angio Chest Pe W/cm &/or Wo Cm  03/05/2016  CLINICAL DATA:  Tachycardia. EXAM: CT ANGIOGRAPHY CHEST WITH CONTRAST TECHNIQUE: Multidetector CT imaging of the chest was performed using the standard protocol during bolus administration of intravenous contrast. Multiplanar CT image reconstructions and MIPs were obtained to evaluate the vascular anatomy. CONTRAST:  100 cc Isovue 370 IV COMPARISON:  Radiograph earlier this day.  Chest CT 12/26/2013 FINDINGS: Suboptimal contrast timing bolus, no pulmonary embolus to the level of the segmental branches. Subsegmental branches cannot be assessed. The thoracic aorta is normal in caliber. No aortic dissection. No mediastinal or hilar adenopathy. No pericardial effusion. Linear atelectasis within both lower lobes and dependent right upper lobe. No consolidation to suggest pneumonia. No  pulmonary edema. No pleural effusion. Evaluation of the upper abdomen demonstrates hepatic steatosis. There is questionable peripancreatic edema versus motion. There are no acute or suspicious osseous abnormalities. Review of the MIP images confirms the above findings. IMPRESSION: 1. No central pulmonary embolus.  Cannot assess subsegmental levels. 2. Scattered atelectasis in both lungs, no pneumonia. 3. Question of peripancreatic soft tissue stranding in the upper abdomen. Can be seen with acute pancreatitis. Motion artifact is felt less likely. Recommend correlation with pancreatic enzymes. Electronically Signed   By: Jeb Levering M.D.   On: 03/05/2016 05:10   Dg Chest Port 1 View  03/05/2016  CLINICAL DATA:  Shortness of breath. EXAM: PORTABLE CHEST 1 VIEW COMPARISON:  Front and lateral views 02/20/2016 FINDINGS: Cardiomediastinal contours are unchanged allowing for differences in technique and positioning. No pulmonary edema, confluent consolidation, pleural effusion or pneumothorax. Linear atelectasis or scarring in the left midlung zone. Left humeral head prosthesis, partially included. IMPRESSION: No active disease. Electronically Signed   By: Jeb Levering M.D.   On: 03/05/2016 02:48   Dg Knee Left Port  03/04/2016  CLINICAL DATA:  Status post total knee replacement EXAM: PORTABLE LEFT KNEE - 1-2 VIEW COMPARISON:  10/06/2015 FINDINGS: Tricompartmental knee replacement with components in anticipated position. There is anticipated postoperative soft tissue change in the joint and anteriorly. IMPRESSION: Anticipated postoperative appearance Electronically Signed   By: Skipper Cliche M.D.   On: 03/04/2016 11:28    Assessment & Plan    1.  OA: s/p L TKA.  Per ortho.  2.  PSVT/sinus tachycardia:  Pt with post-op sinus tach then developed SVT overnight.  Broke following 6/12 adenosine.  No recurrence.  Trop nl.  CTA neg for PE.  Echo pending.  Remains sinus tachy in setting of fever of 103 this  AM and anemia (8.1/25.9).  BP too soft to add  blocker.  Follow and treat underlying cause of sinus tach.  3.  Fever: No localizing symptoms.  CTA chest w/o pna.  UA neg.  CCM following.  Blood cultures pending.  Signed, Murray Hodgkins NP  Agree with note by Ignacia Bayley RNP   Pt had PSVT responsive to IV adenosine post -op TKR. Now ST 125. BP soft. No CP/SOB. Exam benign. Await results of 2D. If LV fxn Nl can increase hydration and add low dose BB.  Lorretta Harp, M.D., [place], [facility], [place] [address] [place], [place]  [id number]  [phone] 03/05/2016 8:57 AM

## 2016-03-05 NOTE — Consult Note (Signed)
Reason for Consult: tachycardia SVT 240s Referring Physician: Dr. Franchot Mimes is an 50 y.o. female.  HPI: Ms. Hell is a 50 yo woman with PMH of fibromyalgia, bipolar disorder, GERD, COPD who is s/p left knee TKA for DJD. She developed tachycardia this evening to the 240s after being tachycardic per nursing staff in the 110s most of the day. I was consulted by the orthopaedics team for tachycardia and shortly before I arrived a code blue was called. Fortunately, Ms. Stavely was still mentally alert and had a low normal blood pressure but persistent tachycardic led to alarm. Code blue cancelled but rapid assessment team and code team physicians help assess SVT. Ms. Luttrull was just noted to have a fever of 103.9 and she only complained of left knee pain. She denied chest pain, noted only some mild palpitations. She denied dizziness, nausea/vomiting/diarrhea or recent infections.   Given regular rhythm, adenosine 6 mg was given while on monitor and crash cart pads. No change in rhythm. 12 mg adenosine then given and rhythm broke with 2 second pause followed by PAC and then sinus tachycardic ensued. We also gave 25 mcg of fentanyl for pain in 12.5 mcg and 12.5 mcg after confirming bp 130s/70s after adenosine broke her rhythm.   She was arranged transfer to ICU.   Past Medical History  Diagnosis Date  . Fibromyalgia   . Bipolar 1 disorder ([place])   . Spinal stenosis   . Hypertension   . Obesity   . GERD (gastroesophageal reflux disease)   . PUD (peptic ulcer disease)   . Anxiety   . COPD (chronic obstructive pulmonary disease) ([place])   . Pneumonia     developed in 07/2015- post shoulder replacement   . Depression   . Osteoarthritis     knees, elbows   . 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 reports that she is cured   . Primary localized osteoarthritis of left knee 03/04/2016    Past Surgical History  Procedure  Laterality Date  . Abdominal hysterectomy    . Arthroscopic knee    . Cardiovascular stress test  03/2010    Lexiscan Myoview: EF 55%, mild breast attenuation, no ischemia or scar  . Transthoracic echocardiogram  03/2010    EF 50-55%, normal LV size, no WMAs  . Tonsillectomy    . Total shoulder arthroplasty Left 07/17/2015    Procedure: TOTAL SHOULDER ARTHROPLASTY;  Surgeon: Marchia Bond, MD;  Location: [facility];  Service: Orthopedics;  Laterality: Left;  . Tubal ligation    . Joint replacement      L shoulder     Family History  Problem Relation Age of Onset  . Diabetes Other   . Hypertension Other   . Schizophrenia Mother   . Hypertension Mother   . Stroke Father     Social History:  reports that she has been smoking Cigarettes.  She has been smoking about 0.50 packs per day. She has never used smokeless tobacco. She reports that she does not drink alcohol or use illicit drugs.  Allergies:  Allergies  Allergen Reactions  . Abilify [Aripiprazole] Other (See Comments)    Causes body shaking and tremors  . Latuda [Lurasidone Hcl] Other (See Comments) and Nausea And Vomiting    Causes body shaking and tremors EPS. She is intolerant to all AAP in this way Causes body shaking and tremors  . Naproxen Anaphylaxis  .  Wellbutrin [Bupropion] Other (See Comments)    Shakes and jerking EPS     Medications:  I have reviewed the patient's current medications. Prior to Admission:  Prescriptions prior to admission  Medication Sig Dispense Refill Last Dose  . albuterol (PROAIR HFA) 108 (90 Base) MCG/ACT inhaler Inhale 2 puffs into the lungs every 6 (six) hours as needed for wheezing or shortness of breath.    03/03/2016 at Unknown time  . ALPRAZolam (XANAX) 1 MG tablet Take 1 mg by mouth 3 (three) times daily.    03/03/2016 at Unknown time  . amitriptyline (ELAVIL) 50 MG tablet TAKE 1 TABLET BY MOUTH EVERY NIGHT AT BEDTIME 90 tablet 0 03/03/2016 at Unknown time  . aspirin 81 MG chewable  tablet Chew 81 mg by mouth at bedtime.    Past Week at Unknown time  . baclofen (LIORESAL) 10 MG tablet Take 1 tablet (10 mg total) by mouth 3 (three) times daily as needed for muscle spasms. 90 tablet 0 03/04/2016 at Unknown time  . divalproex (DEPAKOTE ER) 500 MG 24 hr tablet Take 1,000 mg by mouth every evening.    Past Week at Unknown time  . DULoxetine (CYMBALTA) 60 MG capsule TAKE ONE CAPSULE BY MOUTH DAILY (Patient taking differently: TAKE ONE CAPSULE BY MOUTH DAILY    at bedtime) 90 capsule 3 03/03/2016 at Unknown time  . hydrochlorothiazide (HYDRODIURIL) 12.5 MG tablet Take 12.5 mg by mouth daily.    03/03/2016 at Unknown time  . lidocaine (LIDODERM) 5 % Place 1 patch onto the skin every 12 (twelve) hours. Remove & Discard patch within 12 hours or as directed 30 patch 4 Unknown at Unknown time  . nitroGLYCERIN (NITROSTAT) 0.4 MG SL tablet Place 0.4 mg under the tongue every 5 (five) minutes as needed for chest pain.    Unknown at Unknown time  . polyethylene glycol (MIRALAX / GLYCOLAX) packet Take 17 g by mouth daily as needed for mild constipation.     . ranitidine (ZANTAC) 150 MG tablet Take 150 mg by mouth 2 (two) times daily as needed for heartburn.   Past Week at Unknown time  . [DISCONTINUED] oxyCODONE (ROXICODONE) 15 MG immediate release tablet Take 1 tablet (15 mg total) by mouth 3 (three) times daily. (Patient taking differently: Take 15 mg by mouth every 8 (eight) hours as needed for pain. ) 90 tablet 0 Past Week at Unknown time   Scheduled: . ALPRAZolam  1 mg Oral TID  . amitriptyline  50 mg Oral QHS  . aspirin  81 mg Oral QHS  . dexamethasone  10 mg Intravenous Once  . divalproex  1,000 mg Oral QPM  . docusate sodium  100 mg Oral BID  . DULoxetine  60 mg Oral Daily  . famotidine  20 mg Oral BID  . fentaNYL      . hydrochlorothiazide  12.5 mg Oral Daily  . metoprolol  5 mg Intravenous NOW  . oxyCODONE  10 mg Oral Q12H  . rivaroxaban  10 mg Oral Q breakfast  . senna  1 tablet  Oral BID   Continuous: . 0.45 % NaCl with KCl 20 mEq / L 75 mL/hr at 03/04/16 1229    Results for orders placed or performed during the hospital encounter of 03/04/16 (from the past 48 hour(s))  Type and screen [place]     Status: None   Collection Time: 03/04/16  8:57 AM  Result Value Ref Range   ABO/RH(D) O POS  Antibody Screen NEG    Sample Expiration 03/07/2016   ABO/Rh     Status: None   Collection Time: 03/04/16  8:57 AM  Result Value Ref Range   ABO/RH(D) O POS   I-STAT 4, (NA,K, GLUC, HGB,HCT)     Status: Abnormal   Collection Time: 03/04/16  8:59 AM  Result Value Ref Range   Sodium 139 135 - 145 mmol/L   Potassium 4.1 3.5 - 5.1 mmol/L   Glucose, Bld 132 (H) 65 - 99 mg/dL   HCT 34.0 (L) 36.0 - 46.0 %   Hemoglobin 11.6 (L) 12.0 - 15.0 g/dL  Hemoglobin and hematocrit, blood     Status: Abnormal   Collection Time: 03/04/16  6:13 PM  Result Value Ref Range   Hemoglobin 8.8 (L) 12.0 - 15.0 g/dL    Comment: DELTA CHECK NOTED REPEATED TO VERIFY    HCT 28.4 (L) 36.0 - 46.0 %    Dg Knee Left Port  03/04/2016  CLINICAL DATA:  Status post total knee replacement EXAM: PORTABLE LEFT KNEE - 1-2 VIEW COMPARISON:  10/06/2015 FINDINGS: Tricompartmental knee replacement with components in anticipated position. There is anticipated postoperative soft tissue change in the joint and anteriorly. IMPRESSION: Anticipated postoperative appearance Electronically Signed   By: Skipper Cliche M.D.   On: 03/04/2016 11:28    Review of Systems  Constitutional: Positive for fever and malaise/fatigue.  HENT: Negative for ear discharge and ear pain.   Eyes: Negative for double vision and photophobia.  Respiratory: Negative for hemoptysis and sputum production.   Cardiovascular: Positive for palpitations and leg swelling. Negative for chest pain, orthopnea and claudication.  Gastrointestinal: Negative for vomiting, abdominal pain, diarrhea, constipation and blood in stool.    Genitourinary: Negative for dysuria and frequency.  Musculoskeletal: Positive for joint pain. Negative for falls and neck pain.  Skin: Negative for rash.  Neurological: Negative for tingling, tremors and loss of consciousness.  Endo/Heme/Allergies: Negative for polydipsia. Does not bruise/bleed easily.  Psychiatric/Behavioral: Negative for depression, hallucinations and substance abuse.   Blood pressure 136/72, pulse 133, temperature 98 F (36.7 C), temperature source Oral, resp. rate 17, height 5\' 2"  (1.575 m), weight 131.543 kg (290 lb), SpO2 96 %. Physical Exam  Nursing note and vitals reviewed. Constitutional: She is oriented to person, place, and time. She appears well-developed and well-nourished. No distress.  HENT:  Head: Normocephalic and atraumatic.  Nose: Nose normal.  Mouth/Throat: Oropharynx is clear and moist. No oropharyngeal exudate.  Eyes: Conjunctivae and EOM are normal. Pupils are equal, round, and reactive to light. No scleral icterus.  Neck: Normal range of motion. Neck supple. No JVD present. No tracheal deviation present.  Cardiovascular: Regular rhythm.  Exam reveals no gallop.   No murmur heard. tachycardic  Respiratory: Effort normal. She has no wheezes. She has rales.  Bibasilar rales  GI: Soft. Bowel sounds are normal. She exhibits no distension. There is no tenderness. There is no rebound.  Musculoskeletal: She exhibits edema.  Left knee in bandage, mildly swollen Trace edema bilaterally  Neurological: She is alert and oriented to person, place, and time. No cranial nerve deficit.  Skin: Skin is warm. She is not diaphoretic.  Flushed skin  Psychiatric: She has a normal mood and affect. Her behavior is normal. Judgment and thought content normal.  EKG SVT, EKG #2 Sinus tachycardia, monitor - Sinus tachycardia, PVCs, PACs Chest x-ray: mild increase cephalization  Assessment/Plan: Ms. Slemp is a 50 yo woman with PMH of fibromyalgia, bipolar disorder,  GERD, COPD who is s/p left knee TKA for DJD who has developed a fever to 103.9 and SVT. SVT broke with adenosine and sinus tachycardia in 140s-150s ensued. Differential for likely SVT and subsequent sinus tachycardia is pain, infection, pulmonary embolism, volume loss (blood loss versus third spacing) among others. I will defer further evaluation to the ICU team but consider pulmonary embolism and evaluation with CTA as well as infection. Would also draw blood cultures and urine culture. Echocardiogram in AM  Problem List Tachycardia - SVT Sinus Tachycardia S/p Left TKA Fever Acute blood loss anemia Fibromyalgia Bipolar disorder  Plan 1. Evaluate etiologies of tachycardia - TSH, trend cbc, evaluate for infection and PE with CTA 2. Transfer to ICU as above 3. Pain control 4. Echocardiogram in AM 5. Treat pain  Chenell Lozon 03/05/2016, 2:41 AM

## 2016-03-05 NOTE — Progress Notes (Signed)
[facility] Progress Note Patient Name: BELVIE DELLES DOB: 1966-06-30 MRN: [id number]   Date of Service  03/05/2016  HPI/Events of Note  Multiple issues: 1. Progress note states 0.9 NaCl to run at 75 mL/hour and no order for 0.9 NaCl and 2. QTc interval = 550 milliseconds. LVEF = 65-70%  eICU Interventions  Will order: 1. 0.9 NaCl to run IV at 75 mL/hour. 2. Mg++ level now. 3. Monitor QTc interval Q 6 hours. 4. If QTc interval > 500 milliseconds do not give PRN doses of Reglan or Zofran.      Intervention Category Intermediate Interventions: Other: Minor Interventions: Routine modifications to care plan (e.g. PRN medications for pain, fever)  Breslin Burklow [place] 03/05/2016, 9:34 PM

## 2016-03-05 NOTE — Progress Notes (Signed)
Pharmacy Antibiotic Note  [name] is a 50 y.o. female admitted on 03/04/2016 with sepsis.  Pharmacy has been consulted for vancomycin dosing.  Pt given vancomycin 2g IV load.  Plan: Vancomycin 1g IV every 12 hours.  Goal trough 15-20 mcg/mL.  Monitor culture data, renal function and clinical course VT at [facility] prn  Height: 5\' 2"  (157.5 cm) Weight: 293 lb (132.904 kg) IBW/kg (Calculated) : 50.1  Temp (24hrs), Avg:99.1 F (37.3 C), Min:98 F (36.7 C), Max:103.9 F (39.9 C)   Recent Labs Lab 03/05/16 0246  WBC 4.3  CREATININE 1.01*  LATICACIDVEN 3.6*    Estimated Creatinine Clearance: 86.6 mL/min (by C-G formula based on Cr of 1.01).    Allergies  Allergen Reactions  . Abilify [Aripiprazole] Other (See Comments)    Causes body shaking and tremors  . Latuda [Lurasidone Hcl] Other (See Comments) and Nausea And Vomiting    Causes body shaking and tremors EPS. She is intolerant to all AAP in this way Causes body shaking and tremors  . Naproxen Anaphylaxis  . Wellbutrin [Bupropion] Other (See Comments)    Shakes and jerking EPS     Antimicrobials this admission: Vanc 5/24 >>   Dose adjustments this admission: n/a  Microbiology results:  BCx:   UCx:    Sputum:   5/10 MRSA PCR: neg   Andrey Cota. Diona Foley, PharmD, BCPS Clinical Pharmacist Pager [phone] 03/05/2016 4:15 AM

## 2016-03-05 NOTE — Progress Notes (Signed)
PT Cancellation Note  Patient Details Name: [name] MRN: [id number] DOB: 1966-03-19   Cancelled Treatment:    Reason Eval/Treat Not Completed: Fatigue/lethargy limiting ability to participate; attempted and pt eating lunch but having hard time staying awake to do so.  Will attempt later.   Reginia Naas 03/05/2016, 1:53 PM  Magda Kiel, [place] 03/05/2016

## 2016-03-05 NOTE — Progress Notes (Signed)
Pharmacy Antibiotic Note  [name] is a 50 y.o. female admitted on 03/04/2016 with for TKA who then developed tachycardia to 240s SVT and fever to 103.9 after the surgery concerning for sepsis.  Pharmacy has been consulted for vancomycin and ceftazidime dosing.  Vancomycin started this AM, now adding ceftazidime. Pt remains febrile and tachycardic. WBC wnl. LA trending down.   Plan: Start Ceftazidime 2 g IV q8h  Continue vancomycin 1 g IV q12h Monitor renal function, cultures, LOT, clinical picture, VT at [facility] prn  Height: 5\' 2"  (157.5 cm) Weight: 293 lb (132.904 kg) IBW/kg (Calculated) : 50.1  Temp (24hrs), Avg:100.3 F (37.9 C), Min:98 F (36.7 C), Max:103.9 F (39.9 C)   Recent Labs Lab 03/05/16 0246 03/05/16 0530  WBC 4.3  --   CREATININE 1.01*  --   LATICACIDVEN 3.6* 2.8*    Estimated Creatinine Clearance: 86.6 mL/min (by C-G formula based on Cr of 1.01).    Allergies  Allergen Reactions  . Abilify [Aripiprazole] Other (See Comments)    Causes body shaking and tremors  . Latuda [Lurasidone Hcl] Other (See Comments) and Nausea And Vomiting    Causes body shaking and tremors EPS. She is intolerant to all AAP in this way Causes body shaking and tremors  . Naproxen Anaphylaxis  . Wellbutrin [Bupropion] Other (See Comments)    Shakes and jerking EPS     Antimicrobials this admission: Vanc 5/24 >>  Ceftazidime 5/24 >>   Dose adjustments this admission: n/a  Microbiology results: 5/24 BCx:    Thank you for allowing pharmacy to be a part of this patient's care.  Joya San, PharmD Clinical Pharmacy Resident Pager # [phone] 03/05/2016 10:22 AM

## 2016-03-05 NOTE — Progress Notes (Signed)
[facility] Progress Note Patient Name: [name] DOB: 04-04-1966 MRN: [id number]   Date of Service  03/05/2016  HPI/Events of Note  Fever, SVT D#2 TKR Now s tach, p-adenosine Lactate 3.6   eICU Interventions  Rpt lactate in 3h Doubt sepsis but vanc IV CTA planned     Intervention Category Evaluation Type: New Patient Evaluation  Ayoub Arey V. 03/05/2016, 4:15 AM

## 2016-03-05 NOTE — Progress Notes (Signed)
CRITICAL VALUE ALERT  Critical value received:  Lactic Acid   Date of notification:  03/05/16  Time of notification:  0352  Critical value read back:Yes.    Nurse who received alert:  [place] Brinden Kincheloe RN   MD notified (1st page): Dr. Elsworth Soho   Time of first page& response :  [phone]

## 2016-03-05 NOTE — Progress Notes (Signed)
Physical Therapy Treatment Patient Details Name: BURNICE KURZAWA MRN: [id number] DOB: 09/19/1966 Today's Date: 03/05/2016    History of Present Illness Patient is a 50 y/o female s/p L TKA.  PMH includes fibromyalgia, bipolar, SS, HTN, obesity, anxiety, COPD, TSA 07/2015, GERD, PUD, COPD.  Developed SVT and moved to ICU overnight 5/23.    PT Comments    Patient limited with mobility due to continued lethargy and pain with mobility.  Feel she may need extra time to rehab prior to returning home alone.  May need SNF level rehab prior to d/c home.  PT to follow acutely.  Follow Up Recommendations  SNF     Equipment Recommendations  Rolling walker with 5" wheels;3in1 (PT)    Recommendations for Other Services       Precautions / Restrictions Precautions Precautions: Fall Required Braces or Orthoses: Knee Immobilizer - Left Restrictions LLE Weight Bearing: Weight bearing as tolerated    Mobility  Bed Mobility Overal bed mobility: Needs Assistance       Supine to sit: Mod assist     General bed mobility comments: assist to bring legs to EOB and scoot forward  Transfers Overall transfer level: Needs assistance Equipment used: Rolling walker (2 wheeled) Transfers: Sit to/from [facility] Sit to Stand: Min assist Stand pivot transfers: Min assist       General transfer comment: up from bed and BSC, used walker to pivot to [facility]   Ambulation/Gait Ambulation/Gait assistance: Min assist Ambulation Distance (Feet): 3 Feet Assistive device: Rolling walker (2 wheeled) Gait Pattern/deviations: Step-to pattern;Trunk flexed;Decreased stride length;Antalgic;Decreased weight shift to left     General Gait Details: leans on elbows to take step with R due to painful L LE   Stairs            Wheelchair Mobility    Modified Rankin (Stroke Patients Only)       Balance     Sitting balance-Leahy Scale: Fair     Standing balance support: Bilateral  upper extremity supported Standing balance-Leahy Scale: Poor Standing balance comment: UE support needed for balance                    Cognition Arousal/Alertness: Lethargic;Suspect due to medications Behavior During Therapy: [facility] for tasks assessed/performed Overall Cognitive Status: Within Functional Limits for tasks assessed                      Exercises Total Joint Exercises Ankle Circles/Pumps: AROM;10 reps;Both;Supine Heel Slides: AAROM;PROM;Left;10 reps;Supine    General Comments        Pertinent Vitals/Pain Pain Assessment: Faces Faces Pain Scale: Hurts little more Pain Location: L knee with movement, weight bearing Pain Descriptors / Indicators: Operative site guarding Pain Intervention(s): Monitored during session;Repositioned    Home Living                      Prior Function            PT Goals (current goals can now be found in the care plan section) Progress towards PT goals: Not progressing toward goals - comment (due to medical decline)    Frequency  7X/week    PT Plan Discharge plan needs to be updated    Co-evaluation             End of Session Equipment Utilized During Treatment: Gait belt;Left knee immobilizer Activity Tolerance: Patient limited by lethargy Patient left: in chair;with call bell/phone within  reach;with chair alarm set     Time: 1430-1453 PT Time Calculation (min) (ACUTE ONLY): 23 min  Charges:  $Gait Training: 8-22 mins $Therapeutic Exercise: 8-22 mins                    G Codes:      Reginia Naas 2016/03/11, 5:10 PM  Magda Kiel, [place] 03/11/16

## 2016-03-06 ENCOUNTER — Inpatient Hospital Stay (HOSPITAL_COMMUNITY): Payer: Medicare HMO

## 2016-03-06 ENCOUNTER — Encounter: Payer: 59 | Admitting: Registered Nurse

## 2016-03-06 ENCOUNTER — Encounter (HOSPITAL_COMMUNITY): Payer: Self-pay | Admitting: General Practice

## 2016-03-06 DIAGNOSIS — R000 Tachycardia, unspecified: Secondary | ICD-10-CM

## 2016-03-06 DIAGNOSIS — F411 Generalized anxiety disorder: Secondary | ICD-10-CM

## 2016-03-06 DIAGNOSIS — I951 Orthostatic hypotension: Secondary | ICD-10-CM

## 2016-03-06 DIAGNOSIS — K210 Gastro-esophageal reflux disease with esophagitis: Secondary | ICD-10-CM

## 2016-03-06 LAB — BASIC METABOLIC PANEL
ANION GAP: 7 (ref 5–15)
BUN: 7 mg/dL (ref 6–20)
CHLORIDE: 101 mmol/L (ref 101–111)
CO2: 30 mmol/L (ref 22–32)
Calcium: 8.7 mg/dL — ABNORMAL LOW (ref 8.9–10.3)
Creatinine, Ser: 0.6 mg/dL (ref 0.44–1.00)
GFR calc non Af Amer: >60 mL/min (ref >60)
Glucose, Bld: 133 mg/dL — ABNORMAL HIGH (ref 65–99)
Potassium: 4 mmol/L (ref 3.5–5.1)
Sodium: 138 mmol/L (ref 135–145)

## 2016-03-06 LAB — CBC
HEMATOCRIT: 22.8 % — AB (ref 36.0–46.0)
HEMATOCRIT: 20.8 % — AB (ref 36.0–46.0)
HEMOGLOBIN: 7.1 g/dL — AB (ref 12.0–15.0)
Hemoglobin: 6.4 g/dL — CL (ref 12.0–15.0)
MCH: 26.1 pg (ref 26.0–34.0)
MCH: 25.8 pg — AB (ref 26.0–34.0)
MCHC: 31.1 g/dL (ref 30.0–36.0)
MCHC: 30.8 g/dL (ref 30.0–36.0)
MCV: 83.8 fL (ref 78.0–100.0)
MCV: 83.9 fL (ref 78.0–100.0)
PLATELETS: 185 10*3/uL (ref 150–400)
Platelets: 190 10*3/uL (ref 150–400)
RBC: 2.72 MIL/uL — ABNORMAL LOW (ref 3.87–5.11)
RBC: 2.48 MIL/uL — ABNORMAL LOW (ref 3.87–5.11)
RDW: 15.3 % (ref 11.5–15.5)
RDW: 15.3 % (ref 11.5–15.5)
WBC: 9 10*3/uL (ref 4.0–10.5)
WBC: 6.4 10*3/uL (ref 4.0–10.5)

## 2016-03-06 LAB — MAGNESIUM: Magnesium: 1.9 mg/dL (ref 1.7–2.4)

## 2016-03-06 LAB — PREPARE RBC (CROSSMATCH)

## 2016-03-06 LAB — PHOSPHORUS: PHOSPHORUS: 2 mg/dL — AB (ref 2.5–4.6)

## 2016-03-06 LAB — PROCALCITONIN: PROCALCITONIN: 0.35 ng/mL

## 2016-03-06 LAB — CALCIUM, IONIZED: CALCIUM, IONIZED, SERUM: 4.6 mg/dL (ref 4.5–5.6)

## 2016-03-06 MED ORDER — SODIUM CHLORIDE 0.9 % IV SOLN
Freq: Once | INTRAVENOUS | Status: AC
  Administered 2016-03-06: 19:00:00 via INTRAVENOUS

## 2016-03-06 MED ORDER — SODIUM PHOSPHATES 45 MMOLE/15ML IV SOLN
10.0000 mmol | Freq: Once | INTRAVENOUS | Status: AC
  Administered 2016-03-06: 10 mmol via INTRAVENOUS
  Filled 2016-03-06: qty 3.33

## 2016-03-06 NOTE — Evaluation (Signed)
Occupational Therapy Evaluation Patient Details Name: [name] MRN: [id number] DOB: 1966/05/08 Today's Date: 03/06/2016    History of Present Illness Patient is a 50 y/o female s/p L TKA.  PMH includes fibromyalgia, bipolar, SS, HTN, obesity, anxiety, COPD, TSA 07/2015, GERD, PUD, COPD.  Developed SVT and moved to ICU overnight 5/23.   Clinical Impression   Pt admitted with above. She demonstrates the below listed deficits.  She requires mod A overall for ADLs, fatigues quickly.  Recommend SNF level rehab at discharge.  OT will defer further OT needs to SNF, and will sign off at this time     Follow Up Recommendations  SNF    Equipment Recommendations  None recommended by OT    Recommendations for Other Services       Precautions / Restrictions Precautions Precautions: Fall Required Braces or Orthoses: Knee Immobilizer - Left Restrictions Weight Bearing Restrictions: Yes LLE Weight Bearing: Weight bearing as tolerated      Mobility Bed Mobility Overal bed mobility: Needs Assistance Bed Mobility: Supine to Sit     Supine to sit: Mod assist     General bed mobility comments: Pt requires step by step cues.  She is impulsive - requires mod cues for safety   Transfers Overall transfer level: Needs assistance Equipment used: Rolling walker (2 wheeled) Transfers: Sit to/from [facility] Sit to Stand: Min assist Stand pivot transfers: Min assist       General transfer comment: assist to move into standing and assist for balance.  Mod cues for hand placement and safety     Balance Overall balance assessment: Needs assistance Sitting-balance support: Feet supported Sitting balance-Leahy Scale: Fair     Standing balance support: Bilateral upper extremity supported Standing balance-Leahy Scale: Poor                              ADL Overall ADL's : Needs assistance/impaired Eating/Feeding: Independent   Grooming: Wash/dry  hands;Wash/dry face;Oral care;Set up;Sitting   Upper Body Bathing: Minimal assitance;Sitting   Lower Body Bathing: Maximal assistance;Sit to/from stand   Upper Body Dressing : Minimal assistance;Sitting   Lower Body Dressing: Total assistance;Sit to/from stand Lower Body Dressing Details (indicate cue type and reason): Pt able to bed forward to touch mid calf, but unable to access feet  Toilet Transfer: Minimal assistance;Ambulation;Comfort height toilet;Grab bars;BSC;RW   Toileting- Clothing Manipulation and Hygiene: Maximal assistance;Sit to/from stand Toileting - Clothing Manipulation Details (indicate cue type and reason): Pt requires assist for clothing manipulation and for posterior peri care      Functional mobility during ADLs: Minimal assistance;Rolling walker General ADL Comments: Pt requires cues for safety      Vision     Perception     Praxis      Pertinent Vitals/Pain Pain Assessment: Faces Faces Pain Scale: Hurts little more Pain Location: Lt knee  Pain Descriptors / Indicators: Operative site guarding Pain Intervention(s): Monitored during session     Hand Dominance Right   Extremity/Trunk Assessment Upper Extremity Assessment Upper Extremity Assessment: Overall WFL for tasks assessed   Lower Extremity Assessment Lower Extremity Assessment: Defer to PT evaluation   Cervical / Trunk Assessment Cervical / Trunk Assessment: Normal   Communication Communication Communication: No difficulties   Cognition Arousal/Alertness: Awake/alert Behavior During Therapy: Impulsive Overall Cognitive Status: Within Functional Limits for tasks assessed  General Comments       Exercises       Shoulder Instructions      Home Living Family/patient expects to be discharged to:: Private residence Living Arrangements: Other relatives                               Additional Comments: Pt lives with roommate who works  during the day       Prior Functioning/Environment Level of Independence: Independent             OT Diagnosis: Generalized weakness;Acute pain   OT Problem List: Decreased strength;Decreased activity tolerance;Impaired balance (sitting and/or standing);Decreased safety awareness;Decreased knowledge of use of DME or AE;Decreased knowledge of precautions;Obesity;Pain   OT Treatment/Interventions:      OT Goals(Current goals can be found in the care plan section) Acute Rehab OT Goals Patient Stated Goal: To go home  OT Goal Formulation: All assessment and education complete, DC therapy  OT Frequency:     Barriers to D/C:            Co-evaluation              End of Session Equipment Utilized During Treatment: Rolling walker Nurse Communication: Mobility status  Activity Tolerance: Patient limited by fatigue Patient left: in chair;with call bell/phone within reach;with nursing/sitter in room;with family/visitor present   Time: 1343-1429 OT Time Calculation (min): 46 min Charges:  OT General Charges $OT Visit: 1 Procedure OT Evaluation $OT Eval Moderate Complexity: 1 Procedure OT Treatments $Self Care/Home Management : 23-37 mins G-Codes:    Noora Locascio M 03-27-16, 2:50 PM

## 2016-03-06 NOTE — Progress Notes (Signed)
Patient Name: [name] Date of Encounter: 03/06/2016    Principal Problem:   Primary localized osteoarthritis of left knee Active Problems:   S/P total knee arthroplasty   SVT (supraventricular tachycardia) (HCC)   Atelectasis   Absolute anemia   Arterial hypotension   Pulmonary emphysema (HCC)   Sinus tachycardia (HCC)   GERD (gastroesophageal reflux disease)    SUBJECTIVE  Denies any CP or SOB. Hungry, want to eat  CURRENT MEDS . ALPRAZolam  1 mg Oral TID  . antiseptic oral rinse  7 mL Mouth Rinse BID  . aspirin  81 mg Oral QHS  . budesonide (PULMICORT) nebulizer solution  0.5 mg Nebulization BID  . cefTAZidime (FORTAZ)  IV  2 g Intravenous Q8H  . divalproex  1,000 mg Oral QPM  . docusate sodium  100 mg Oral BID  . DULoxetine  60 mg Oral Daily  . famotidine  20 mg Oral BID  . ipratropium  0.5 mg Nebulization Q6H  . oxyCODONE  10 mg Oral Q12H  . rivaroxaban  10 mg Oral Q breakfast  . senna  1 tablet Oral BID  . vancomycin  1,000 mg Intravenous Q12H    OBJECTIVE  Filed Vitals:   03/06/16 0400 03/06/16 0500 03/06/16 0600 03/06/16 0700  BP: 118/67 127/80 120/73 99/52  Pulse: 102 104 101 102  Temp:      TempSrc:      Resp: 14 22 15 17   Height:      Weight:      SpO2: 100% 100% 99% 99%    Intake/Output Summary (Last 24 hours) at 03/06/16 0748 Last data filed at 03/06/16 0700  Gross per 24 hour  Intake   3865 ml  Output   3275 ml  Net    590 ml   Filed Weights   03/04/16 0633 03/05/16 0330  Weight: 290 lb (131.543 kg) 293 lb (132.904 kg)    PHYSICAL EXAM  General: Pleasant, NAD. Neuro: Alert and oriented X 3. Moves all extremities spontaneously. Psych: Normal affect. HEENT:  Normal  Neck: Supple without bruits or JVD. Lungs:  Resp regular and unlabored, CTA. Heart: tachycardic, regular. no s3, s4, or murmurs. Abdomen: Soft, non-tender, non-distended, BS + x 4.  Extremities: No clubbing, cyanosis or edema. DP/PT/Radials 2+ and equal  bilaterally.  Accessory Clinical Findings  CBC  Recent Labs  03/05/16 0246 03/06/16 0344  WBC 4.3 9.0  HGB 8.1* 7.1*  HCT 25.9* 22.8*  MCV 82.0 83.8  PLT 229 [id number]   Basic Metabolic Panel  Recent Labs  03/05/16 1139 03/05/16 2200 03/06/16 0344  NA 134*  --  138  K 3.9  --  4.0  CL 99*  --  101  CO2 28  --  30  GLUCOSE 180*  --  133*  BUN 6  --  7  CREATININE 0.77  --  0.60  CALCIUM 8.1*  --  8.7*  MG  --  1.8 1.9  PHOS  --   --  2.0*   Liver Function Tests  Recent Labs  03/05/16 0246  AST 30  ALT 13*  ALKPHOS 37*  BILITOT 0.3  PROT 5.8*  ALBUMIN 2.6*    Recent Labs  03/05/16 0723  LIPASE 98*  AMYLASE 61   Cardiac Enzymes  Recent Labs  03/05/16 0246 03/05/16 0723 03/05/16 1139  CKTOTAL  --   --  102  TROPONINI <0.03 0.03  --    Thyroid Function Tests  Recent Labs  03/05/16 0723  TSH 1.014    TELE Sinus tach with HR 100s    ECG  No new EKG  Echocardiogram 03/05/2016  LV EF: 65% - 70%  ------------------------------------------------------------------- Indications: Chest pain 786.51. Fever 780.6.  ------------------------------------------------------------------- History: PMH: Chronic obstructive pulmonary disease. Risk factors: Current tobacco use. Hypertension. Morbidly obese.  ------------------------------------------------------------------- Study Conclusions  - Left ventricle: The cavity size was normal. Wall thickness was  normal. Systolic function was vigorous. The estimated ejection  fraction was in the range of 65% to 70%. Wall motion was normal;  there were no regional wall motion abnormalities. - Mitral valve: There was mild regurgitation.  Impressions:  - Compared to the prior study, there has been no significant  interval change.     Radiology/Studies  Dg Chest 2 View  02/20/2016  CLINICAL DATA:  Preop testing for knee arthroplasty EXAM: CHEST  2 VIEW COMPARISON:  07/18/2015  FINDINGS: Normal heart size and mediastinal contours. No infiltrate or edema. No effusion or pneumothorax. Left glenohumeral arthroplasty with stable alignment since prior. IMPRESSION: No active cardiopulmonary disease. Electronically Signed   By: Monte Fantasia M.D.   On: 02/20/2016 14:19   Ct Angio Chest Pe W/cm &/or Wo Cm  03/05/2016  CLINICAL DATA:  Tachycardia. EXAM: CT ANGIOGRAPHY CHEST WITH CONTRAST TECHNIQUE: Multidetector CT imaging of the chest was performed using the standard protocol during bolus administration of intravenous contrast. Multiplanar CT image reconstructions and MIPs were obtained to evaluate the vascular anatomy. CONTRAST:  100 cc Isovue 370 IV COMPARISON:  Radiograph earlier this day.  Chest CT 12/26/2013 FINDINGS: Suboptimal contrast timing bolus, no pulmonary embolus to the level of the segmental branches. Subsegmental branches cannot be assessed. The thoracic aorta is normal in caliber. No aortic dissection. No mediastinal or hilar adenopathy. No pericardial effusion. Linear atelectasis within both lower lobes and dependent right upper lobe. No consolidation to suggest pneumonia. No pulmonary edema. No pleural effusion. Evaluation of the upper abdomen demonstrates hepatic steatosis. There is questionable peripancreatic edema versus motion. There are no acute or suspicious osseous abnormalities. Review of the MIP images confirms the above findings. IMPRESSION: 1. No central pulmonary embolus.  Cannot assess subsegmental levels. 2. Scattered atelectasis in both lungs, no pneumonia. 3. Question of peripancreatic soft tissue stranding in the upper abdomen. Can be seen with acute pancreatitis. Motion artifact is felt less likely. Recommend correlation with pancreatic enzymes. Electronically Signed   By: Jeb Levering M.D.   On: 03/05/2016 05:10   Dg Chest Port 1 View  03/06/2016  CLINICAL DATA:  Pulmonary edema. EXAM: PORTABLE CHEST 1 VIEW COMPARISON:  CT 03/05/2016.  Chest x-ray  03/05/2016. FINDINGS: Mediastinum and hilar structures normal. Bibasilar subsegmental atelectasis again noted. No pleural effusion or pneumothorax. Heart size stable. Left shoulder replacement. IMPRESSION: Persistent bibasilar subsegmental atelectasis. Electronically Signed   By: [place]   On: 03/06/2016 06:45   Dg Chest Port 1 View  03/05/2016  CLINICAL DATA:  Shortness of breath. EXAM: PORTABLE CHEST 1 VIEW COMPARISON:  Front and lateral views 02/20/2016 FINDINGS: Cardiomediastinal contours are unchanged allowing for differences in technique and positioning. No pulmonary edema, confluent consolidation, pleural effusion or pneumothorax. Linear atelectasis or scarring in the left midlung zone. Left humeral head prosthesis, partially included. IMPRESSION: No active disease. Electronically Signed   By: Jeb Levering M.D.   On: 03/05/2016 02:48   Dg Knee Left Port  03/04/2016  CLINICAL DATA:  Status post total knee replacement EXAM: PORTABLE LEFT KNEE - 1-2 VIEW COMPARISON:  10/06/2015 FINDINGS: Tricompartmental knee replacement with components in anticipated position. There is anticipated postoperative soft tissue change in the joint and anteriorly. IMPRESSION: Anticipated postoperative appearance Electronically Signed   By: Skipper Cliche M.D.   On: 03/04/2016 11:28    ASSESSMENT AND PLAN  1. Paroxysmal SVT post op L knee TKA  - tachycardic post op, cardiology initially consulted for tachycardia, however shortly before consult, code blue called for SVT  - CTA 03/05/2016 no PE, possible pancreatitis  - Echo 03/05/2016 EF 65-70%, no RWMA, mild MR  - she has multiple reason to be tachycardic, she is anemic with hgb trending down (11.6 --> 8.8 --> 8.1 --> 7.1), febrile (103.5 in AM of 5/24), stress from surgery, questionable pancreatitis seen on CTA of chest. UA negative. Consider blood culture if suspect further infection, note procalcitonin negative. Her lipase was 98 yesterday, however she  has no epigastric pain, unclear significance of lab result. Consider transfusion to keep Hgb around 10. Otherwise, no further recommendation by cardiology. Treat underlying cause for tachycardia. Stable to transfer to telemetry after transfusion. Cardiology signing off  2. DJD s/p L knee TKA  3. Anemia: see #1  4. Fever: resolved, but unclear cause. Procalcitonin negative   Signed, Almyra Deforest PA-C Pager: [phone]  Agree with note by Almyra Deforest PA-C  HR better. BP soft. Hgb trending down. HR 100 ST. LVEF nl. Exam benign. Prob would benefit from transfusion. No reason to put on BB. Will S/O . Call if we can be of further assistance. Prob OK to Tx to tele.   Lorretta Harp, M.D., [place], [facility], [place] [address] [place], [place]  [phone]  [phone] 03/06/2016 8:17 AM

## 2016-03-06 NOTE — Discharge Instructions (Signed)
INSTRUCTIONS AFTER JOINT REPLACEMENT  ° °o Remove items at home which could result in a fall. This includes throw rugs or furniture in walking pathways °o ICE to the affected joint every three hours while awake for 30 minutes at a time, for at least the first 3-5 days, and then as needed for pain and swelling.  Continue to use ice for pain and swelling. You may notice swelling that will progress down to the foot and ankle.  This is normal after surgery.  Elevate your leg when you are not up walking on it.   °o Continue to use the breathing machine you got in the hospital (incentive spirometer) which will help keep your temperature down.  It is common for your temperature to cycle up and down following surgery, especially at night when you are not up moving around and exerting yourself.  The breathing machine keeps your lungs expanded and your temperature down. ° ° °DIET:  As you were doing prior to hospitalization, we recommend a well-balanced diet. ° °DRESSING / WOUND CARE / SHOWERING ° °You may change your dressing 3-5 days after surgery.  Then change the dressing every day with sterile gauze.  Please use good hand washing techniques before changing the dressing.  Do not use any lotions or creams on the incision until instructed by your surgeon. ° °ACTIVITY ° °o Increase activity slowly as tolerated, but follow the weight bearing instructions below.   °o No driving for 6 weeks or until further direction given by your physician.  You cannot drive while taking narcotics.  °o No lifting or carrying greater than 10 lbs. until further directed by your surgeon. °o Avoid periods of inactivity such as sitting longer than an hour when not asleep. This helps prevent blood clots.  °o You may return to work once you are authorized by your doctor.  ° ° ° °WEIGHT BEARING  ° °Weight bearing as tolerated with assist device (walker, cane, etc) as directed, use it as long as suggested by your surgeon or therapist, typically at  least 4-6 weeks. ° ° °EXERCISES ° °Results after joint replacement surgery are often greatly improved when you follow the exercise, range of motion and muscle strengthening exercises prescribed by your doctor. Safety measures are also important to protect the joint from further injury. Any time any of these exercises cause you to have increased pain or swelling, decrease what you are doing until you are comfortable again and then slowly increase them. If you have problems or questions, call your caregiver or physical therapist for advice.  ° °Rehabilitation is important following a joint replacement. After just a few days of immobilization, the muscles of the leg can become weakened and shrink (atrophy).  These exercises are designed to build up the tone and strength of the thigh and leg muscles and to improve motion. Often times heat used for twenty to thirty minutes before working out will loosen up your tissues and help with improving the range of motion but do not use heat for the first two weeks following surgery (sometimes heat can increase post-operative swelling).  ° °These exercises can be done on a training (exercise) mat, on the floor, on a table or on a bed. Use whatever works the best and is most comfortable for you.    Use music or television while you are exercising so that the exercises are a pleasant break in your day. This will make your life better with the exercises acting as a break   in your routine that you can look forward to.   Perform all exercises about fifteen times, three times per day or as directed.  You should exercise both the operative leg and the other leg as well. ° °Exercises include: °  °• Quad Sets - Tighten up the muscle on the front of the thigh (Quad) and hold for 5-10 seconds.   °• Straight Leg Raises - With your knee straight (if you were given a brace, keep it on), lift the leg to 60 degrees, hold for 3 seconds, and slowly lower the leg.  Perform this exercise against  resistance later as your leg gets stronger.  °• Leg Slides: Lying on your back, slowly slide your foot toward your buttocks, bending your knee up off the floor (only go as far as is comfortable). Then slowly slide your foot back down until your leg is flat on the floor again.  °• Angel Wings: Lying on your back spread your legs to the side as far apart as you can without causing discomfort.  °• Hamstring Strength:  Lying on your back, push your heel against the floor with your leg straight by tightening up the muscles of your buttocks.  Repeat, but this time bend your knee to a comfortable angle, and push your heel against the floor.  You may put a pillow under the heel to make it more comfortable if necessary.  ° °A rehabilitation program following joint replacement surgery can speed recovery and prevent re-injury in the future due to weakened muscles. Contact your doctor or a physical therapist for more information on knee rehabilitation.  ° ° °CONSTIPATION ° °Constipation is defined medically as fewer than three stools per week and severe constipation as less than one stool per week.  Even if you have a regular bowel pattern at home, your normal regimen is likely to be disrupted due to multiple reasons following surgery.  Combination of anesthesia, postoperative narcotics, change in appetite and fluid intake all can affect your bowels.  ° °YOU MUST use at least one of the following options; they are listed in order of increasing strength to get the job done.  They are all available over the counter, and you may need to use some, POSSIBLY even all of these options:   ° °Drink plenty of fluids (prune juice may be helpful) and high fiber foods °Colace 100 mg by mouth twice a day  °Senokot for constipation as directed and as needed Dulcolax (bisacodyl), take with full glass of water  °Miralax (polyethylene glycol) once or twice a day as needed. ° °If you have tried all these things and are unable to have a bowel  movement in the first 3-4 days after surgery call either your surgeon or your primary doctor.   ° °If you experience loose stools or diarrhea, hold the medications until you stool forms back up.  If your symptoms do not get better within 1 week or if they get worse, check with your doctor.  If you experience "the worst abdominal pain ever" or develop nausea or vomiting, please contact the office immediately for further recommendations for treatment. ° ° °ITCHING:  If you experience itching with your medications, try taking only a single pain pill, or even half a pain pill at a time.  You can also use Benadryl over the counter for itching or also to help with sleep.  ° °TED HOSE STOCKINGS:  Use stockings on both legs until for at least 2 weeks or as   directed by physician office. They may be removed at night for sleeping.  MEDICATIONS:  See your medication summary on the After Visit Summary that nursing will review with you.  You may have some home medications which will be placed on hold until you complete the course of blood thinner medication.  It is important for you to complete the blood thinner medication as prescribed.  PRECAUTIONS:  If you experience chest pain or shortness of breath - call 911 immediately for transfer to the hospital emergency department.   If you develop a fever greater that 101 F, purulent drainage from wound, increased redness or drainage from wound, foul odor from the wound/dressing, or calf pain - CONTACT YOUR SURGEON.                                                   FOLLOW-UP APPOINTMENTS:  If you do not already have a post-op appointment, please call the office for an appointment to be seen by your surgeon.  Guidelines for how soon to be seen are listed in your After Visit Summary, but are typically between 1-4 weeks after surgery.  MAKE SURE YOU:   Understand these instructions.   Get help right away if you are not doing well or get worse.    Thank you for  letting us be a part of your medical care team.  It is a privilege we respect greatly.  We hope these instructions will help you stay on track for a fast and full recovery!    ______________________________________________________________________________________________________________  Information on my medicine - XARELTO (Rivaroxaban)  This medication education was reviewed with me or my healthcare representative as part of my discharge preparation.  The pharmacist that spoke with me during my hospital stay was:  Roma Schanz, [facility]  Why was Xarelto prescribed for you? Xarelto was prescribed for you to reduce the risk of blood clots forming after orthopedic surgery. The medical term for these abnormal blood clots is venous thromboembolism (VTE).  What do you need to know about xarelto ? Take your Xarelto ONCE DAILY at the same time every day. You may take it either with or without food.  If you have difficulty swallowing the tablet whole, you may crush it and mix in applesauce just prior to taking your dose.  Take Xarelto exactly as prescribed by your doctor and DO NOT stop taking Xarelto without talking to the doctor who prescribed the medication.  Stopping without other VTE prevention medication to take the place of Xarelto may increase your risk of developing a clot.  After discharge, you should have regular check-up appointments with your healthcare provider that is prescribing your Xarelto.    What do you do if you miss a dose? If you miss a dose, take it as soon as you remember on the same day then continue your regularly scheduled once daily regimen the next day. Do not take two doses of Xarelto on the same day.   Important Safety Information A possible side effect of Xarelto is bleeding. You should call your healthcare provider right away if you experience any of the following: ? Bleeding from an injury or your nose that does not stop. ? Unusual colored urine (red  or dark brown) or unusual colored stools (red or black). ? Unusual bruising for unknown reasons. ? A serious fall  or if you hit your head (even if there is no bleeding).  Some medicines may interact with Xarelto and might increase your risk of bleeding while on Xarelto. To help avoid this, consult your healthcare provider or pharmacist prior to using any new prescription or non-prescription medications, including herbals, vitamins, non-steroidal anti-inflammatory drugs (NSAIDs) and supplements.  This website has more information on Xarelto: [email or web address]

## 2016-03-06 NOTE — Consult Note (Signed)
PULMONARY / CRITICAL CARE MEDICINE   Name: [name] MRN: [id number] DOB: [birth year]    ADMISSION DATE:  03/04/2016 CONSULTATION DATE:  03/05/16  REFERRING MD:  Mardelle Matte  CHIEF COMPLAINT:  SVT  HISTORY OF PRESENT ILLNESS:  [name] is a 50 y.o. 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  Surgery went well without complication.  Post op she had tachycardia around 110 for most of the day, then the following morning, during early AM hours, she developed HR as high as 240's.  Code blue was called; however, pt never lost pulses and remained alert and oriented throughout; therefore, code cancelled. She was evaluated by rapid response team, code team, and cardiology and decision was made to administer adenosine.  She received 6mg  followed by 12mg  which resulted in a 2 second pause followed by [facility] and recurrent sinus tach.  She was transferred to the ICU and PCCM was consulted.  Of note, she had fever to 103 for which she was started on vanc.  She had CTA performed which did not demonstrate central PE.  SUBJECTIVE:  HR improved with fever control and fluids  VITAL SIGNS: BP 99/52 mmHg  Pulse 102  Temp(Src) 98.3 F (36.8 C) (Oral)  Resp 17  Ht 5\' 2"  (1.575 m)  Wt 132.904 kg (293 lb)  BMI 53.58 kg/m2  SpO2 99%  HEMODYNAMICS:    VENTILATOR SETTINGS:    INTAKE / OUTPUT: I/O last 3 completed shifts: In: [id number] [P.O.:1440; I.V.:1950; IV Piggyback:2050] Out: 3675 [Urine:3675]   PHYSICAL EXAMINATION: General: Middle aged female, in NAD. Neuro: A&O x 3, non-focal.  HEENT: PERRL Cardiovascular: Tachy mild improved to 105, s1 s2  Lungs: CTA, no crackles Abdomen: BS x 4, soft, NT/ND.  Musculoskeletal: Left knee dressings dry Skin: Intact, warm, no rashes.  LABS:  BMET  Recent Labs Lab 03/05/16 0246 03/05/16 1139 03/06/16 0344  NA 133* 134* 138  K 4.0 3.9 4.0  CL 98* 99* 101  CO2 26 28 30   BUN 9 6 7   CREATININE 1.01* 0.77 0.60   GLUCOSE 184* 180* 133*    Electrolytes  Recent Labs Lab 03/05/16 0246 03/05/16 1139 03/05/16 2200 03/06/16 0344  CALCIUM 8.3* 8.1*  --  8.7*  MG  --   --  1.8 1.9  PHOS  --   --   --  2.0*    CBC  Recent Labs Lab 03/04/16 1813 03/05/16 0246 03/06/16 0344  WBC  --  4.3 9.0  HGB 8.8* 8.1* 7.1*  HCT 28.4* 25.9* 22.8*  PLT  --  229 190    Coag's No results for input(s): APTT, INR in the last 168 hours.  Sepsis Markers  Recent Labs Lab 03/05/16 0246 03/05/16 0530 03/05/16 0723 03/06/16 0344  LATICACIDVEN 3.6* 2.8*  --   --   PROCALCITON  --   --  0.30 0.35    ABG No results for input(s): PHART, PCO2ART, PO2ART in the last 168 hours.  Liver Enzymes  Recent Labs Lab 03/05/16 0246  AST 30  ALT 13*  ALKPHOS 37*  BILITOT 0.3  ALBUMIN 2.6*    Cardiac Enzymes  Recent Labs Lab 03/05/16 0246 03/05/16 0723  TROPONINI <0.03 0.03    Glucose  Recent Labs Lab 03/05/16 0737  GLUCAP 138*    Imaging Dg Chest Port 1 View  03/06/2016  CLINICAL DATA:  Pulmonary edema. EXAM: PORTABLE CHEST 1 VIEW COMPARISON:  CT 03/05/2016.  Chest x-ray 03/05/2016. FINDINGS:  Mediastinum and hilar structures normal. Bibasilar subsegmental atelectasis again noted. No pleural effusion or pneumothorax. Heart size stable. Left shoulder replacement. IMPRESSION: Persistent bibasilar subsegmental atelectasis. Electronically Signed   By: Marcello Moores  Register   On: 03/06/2016 06:45     STUDIES:  CTA 05/24 > no central PE.  Scattered atelectasis.  Questionable peripancreatic soft tissue stranding, ? Pancreatitis. CXR 05/24 > no acute process.  CULTURES: Blood 05/24 >  ANTIBIOTICS: Vanc 05/24 >>>5/25 ceftaz 5/24>>>  SIGNIFICANT EVENTS: 05/23 > left TKA. 05/24 > transferred to ICU for sinus tach. 5/25- pos balance, fever control, improved, no further svt  LINES/TUBES: None.  DISCUSSION: 50 y.o. F who underwent left TKA on 05/23.  Had sinus tach post op which worsened  during early AM hours the following day with HR as high as 240's.  She was given adenosine which had some effect, but pt remained in SVT so was transferred to the ICU for further monitoring.  ASSESSMENT / PLAN:  CARDIOVASCULAR A:  SVT - s/p 6mg  and 12mg  adenosine with some decrease in HR but now remains in SVT.  Likely related to fever, favored to be due to atelectasis.  CTA negative for PE. Borderline hypotension Tachy from fevers Hx HTN, sCHF (Echo from March 2015 with EF 55-65%). Repeat echo 65%-70% - hypovolemic P:  Control fevers Allow pos balance to even Tele to remain  PULMONARY A: Acute hypoxic respiratory failure. Post op atelectasis. COPD by report - no PFT's in system. Tobacco dependence. P:   No edema, allow pos to even balance  INFECTIOUS A:   Fever post op - unclear etiology at this point.  Favor atelectasis.  Doubt post op surgical infection. R./o aspiration intraop? Vs transient bacteremia? Unclear Drug reaction, r/o MH PCT low trend P:   Unlikely infectious, dc vanc In am if BC remain neg , dc all abx  RENAL A:   AKI. hypophos P:   NS @ 50  Chem in am  Phos supp  GASTROINTESTINAL A:   Questionable peripancreatic soft tissue stranding - ? Pancreatitis. GERD. Obesity. Nutrition. Hx PUD. P:   Famotidine home med, keep diet   HEMATOLOGIC / ONCOLOGIC A:   Anemia dilution Hx uterine CA - s/p hysterectomy. VTE Prophylaxis, high risk P:  Transfuse for Hgb < 7, cbc at 4 pm NO bleeding clinically SCD's. Xaraelto, crt trend and cbc in pm Reduce fluids  ENDOCRINE A:   At risk hyperglycemia P:   Monitor glu on bmet  NEUROLOGIC A:   Post op pain. Hx fibromyalgia, bipolar 1, spinal stenosis, anxiety, depression. R./o varient malignant hyperthermia P:   Fentanyl PRN. Continue outpatient alprazolam, depakote, duloxetine, amitriptyline. Hold outpatient baclofen (currently on robaxin), amitriptyline. Will discuss with anesthesia agents  used and family history to take - cpk unimpressive  MUSCULOSKELETAL A: DJD - s/p left TKA 05/23 (Dr. Mardelle Matte). P: Post op care per orthopedics. Ortho to evaluate wound / operative site today.  Family updated: Daughter updated  Interdisciplinary Family Meeting v Palliative Care Meeting:  Due by: 05/31.  To floor, tele  Lavon Paganini. Titus Mould, MD, [place] Pgr: [facility]

## 2016-03-06 NOTE — Progress Notes (Signed)
Physical Therapy Treatment Patient Details Name: [name] MRN: [id number] DOB: 1966-04-28 Today's Date: 03/06/2016    History of Present Illness Patient is a 50 y/o female s/p L TKA.  PMH includes fibromyalgia, bipolar, SS, HTN, obesity, anxiety, COPD, TSA 07/2015, GERD, PUD, COPD.  Developed SVT and moved to ICU overnight 5/23.    PT Comments    Noted Hgb 7.1 with cardiology recommendation to transfuse up to 10.0. Per discussion with RN, there are no plans to transfuse patient therefore proceeded with PT. Closely monitored HR with max=132bpm after walking 12 ft with two standing rest periods. Pt with incr dyspnea as well. Very slow progress due to cardiac issues.   Follow Up Recommendations  SNF;Supervision/Assistance - 24 hour     Equipment Recommendations  Rolling walker with 5" wheels;3in1 (PT)    Recommendations for Other Services       Precautions / Restrictions Precautions Precautions: Fall Required Braces or Orthoses: Knee Immobilizer - Left Restrictions Weight Bearing Restrictions: Yes LLE Weight Bearing: Weight bearing as tolerated    Mobility  Bed Mobility Overal bed mobility: Needs Assistance Bed Mobility: Sit to Supine     Supine to sit: Mod assist Sit to supine: Min assist   General bed mobility comments: assist to raise LLE onto bed  Transfers Overall transfer level: Needs assistance Equipment used: Rolling walker (2 wheeled) Transfers: Sit to/from Stand Sit to Stand: Min assist Stand pivot transfers: Min assist (chair to bed)       General transfer comment: continues to need cues for safe use of RW/hand placement; standing erect  Ambulation/Gait Ambulation/Gait assistance: Min assist;+2 safety/equipment Ambulation Distance (Feet): 12 Feet Assistive device: Rolling walker (2 wheeled) Gait Pattern/deviations: Step-to pattern;Decreased stride length;Antalgic;Trunk flexed   Gait velocity interpretation: Below normal speed for  age/gender General Gait Details: tolerated WBAT better today; 2 standing rest breaks over 12 ft ambulation with HR up to 132; chair brought to pt   Stairs            Wheelchair Mobility    Modified Rankin (Stroke Patients Only)       Balance Overall balance assessment: Needs assistance Sitting-balance support: No upper extremity supported;Feet supported Sitting balance-Leahy Scale: Fair     Standing balance support: Bilateral upper extremity supported Standing balance-Leahy Scale: Poor                      Cognition Arousal/Alertness: Awake/alert Behavior During Therapy: WFL for tasks assessed/performed Overall Cognitive Status: Within Functional Limits for tasks assessed                      Exercises Total Joint Exercises Ankle Circles/Pumps: AROM;Both;10 reps Quad Sets: AROM;Left;5 reps Heel Slides: AAROM;PROM;Left;Other reps (comment) (7) Hip ABduction/ADduction: AAROM;Left;5 reps    General Comments General comments (skin integrity, edema, etc.): daughter present and very supportive      Pertinent Vitals/Pain Pain Assessment: 0-10 Pain Score: 4  Faces Pain Scale: Hurts little more Pain Location: Lt knee Pain Descriptors / Indicators: Operative site guarding;Discomfort Pain Intervention(s): Limited activity within patient's tolerance;Monitored during session;Repositioned;Patient requesting pain meds-RN notified    Home Living Family/patient expects to be discharged to:: Private residence Living Arrangements: Other relatives             Additional Comments: Pt lives with roommate who works during the day     Prior Function Level of Independence: Independent          PT Goals (current  goals can now be found in the care plan section) Acute Rehab PT Goals Patient Stated Goal: To go home  Time For Goal Achievement: 03/11/16 Progress towards PT goals: Progressing toward goals (slow progress due to cardiac issues)    Frequency   7X/week    PT Plan Current plan remains appropriate    Co-evaluation             End of Session Equipment Utilized During Treatment: Gait belt;Left knee immobilizer Activity Tolerance: Patient limited by fatigue;Treatment limited secondary to medical complications (Comment) (HR 132) Patient left: in bed;with call bell/phone within reach;with nursing/sitter in room;with family/visitor present     Time: 1455-1521 PT Time Calculation (min) (ACUTE ONLY): 26 min  Charges:                       G Codes:      Demontray Franta Mar 07, 2016, 3:32 PM Pager [phone]

## 2016-03-06 NOTE — Progress Notes (Signed)
Patient ID: SAVANAH LECHTENBERG, female   DOB: Sep 15, 1966, 50 y.o.   MRN: [id number]     Subjective:  Patient reports pain as mild to moderate.  Patient denies any CP or SOB  Objective:   VITALS:   Filed Vitals:   03/06/16 1012 03/06/16 1213 03/06/16 1608 03/06/16 1716  BP: 108/73 126/67 112/65   Pulse: 108 107 108   Temp:  98 F (36.7 C) 98.6 F (37 C) 98.4 F (36.9 C)  TempSrc:  Oral Oral Oral  Resp: 20     Height:      Weight:      SpO2: 99% 100% 96%     ABD soft Sensation intact distally Dorsiflexion/Plantar flexion intact Incision: dressing C/D/I and scant drainage Good foot and ankle motion  Lab Results  Component Value Date   WBC 6.4 03/06/2016   HGB 6.4* 03/06/2016   HCT 20.8* 03/06/2016   MCV 83.9 03/06/2016   PLT 185 03/06/2016   BMET    Component Value Date/Time   NA 138 03/06/2016 0344   K 4.0 03/06/2016 0344   CL 101 03/06/2016 0344   CO2 30 03/06/2016 0344   GLUCOSE 133* 03/06/2016 0344   BUN 7 03/06/2016 0344   CREATININE 0.60 03/06/2016 0344   CALCIUM 8.7* 03/06/2016 0344   GFRNONAA >60 03/06/2016 0344   GFRAA >60 03/06/2016 0344     Assessment/Plan: 2 Days Post-Op   Principal Problem:   Primary localized osteoarthritis of left knee Active Problems:   S/P total knee arthroplasty   SVT (supraventricular tachycardia) (HCC)   Atelectasis   Absolute anemia   Arterial hypotension   Pulmonary emphysema (HCC)   Sinus tachycardia (HCC)   GERD (gastroesophageal reflux disease)   Advance diet Up with therapy Two units PRBC's WBAT Dry dressing PRN    DOUGLAS PARRY, BRANDON 03/06/2016, 5:35 PM  Discussed and agree with above.  Likely SNF planniing.  ICU admission may have been due to a variant of malignant hyperthermia.  Dr. Titus Mould to investigate.  OK for floor, with tele.  ABLA, needs blood.    Marchia Bond, MD Cell [phone]

## 2016-03-06 NOTE — Progress Notes (Signed)
CRITICAL VALUE ALERT  Critical value received:  Hgb: 6.4  Date of notification:  03/06/16  Time of notification:  [id number]  Critical value read back:Yes.    Nurse who received alert:  Egbert Garibaldi  MD notified (1st page):  Roger Kill  Time of first page:  1735  MD notified (2nd page):  Time of second page:  Responding MD:  Roger Kill  Time MD responded:  [place] spoke with patient and family directly

## 2016-03-07 ENCOUNTER — Encounter (HOSPITAL_COMMUNITY): Payer: Self-pay | Admitting: Orthopedic Surgery

## 2016-03-07 DIAGNOSIS — I9589 Other hypotension: Secondary | ICD-10-CM

## 2016-03-07 DIAGNOSIS — D62 Acute posthemorrhagic anemia: Secondary | ICD-10-CM | POA: Diagnosis not present

## 2016-03-07 DIAGNOSIS — T883XXA Malignant hyperthermia due to anesthesia, initial encounter: Secondary | ICD-10-CM

## 2016-03-07 HISTORY — DX: Malignant hyperthermia due to anesthesia, initial encounter: T88.3XXA

## 2016-03-07 LAB — BASIC METABOLIC PANEL
Anion gap: 5 (ref 5–15)
BUN: 11 mg/dL (ref 6–20)
CO2: 32 mmol/L (ref 22–32)
CREATININE: 0.58 mg/dL (ref 0.44–1.00)
Calcium: 8.1 mg/dL — ABNORMAL LOW (ref 8.9–10.3)
Chloride: 102 mmol/L (ref 101–111)
Glucose, Bld: 81 mg/dL (ref 65–99)
POTASSIUM: 3.5 mmol/L (ref 3.5–5.1)
SODIUM: 139 mmol/L (ref 135–145)

## 2016-03-07 LAB — CBC
HCT: 26 % — ABNORMAL LOW (ref 36.0–46.0)
HEMOGLOBIN: 8.3 g/dL — AB (ref 12.0–15.0)
MCH: 26.3 pg (ref 26.0–34.0)
MCHC: 31.9 g/dL (ref 30.0–36.0)
MCV: 82.5 fL (ref 78.0–100.0)
Platelets: 187 10*3/uL (ref 150–400)
RBC: 3.15 MIL/uL — ABNORMAL LOW (ref 3.87–5.11)
RDW: 15 % (ref 11.5–15.5)
WBC: 5.6 10*3/uL (ref 4.0–10.5)

## 2016-03-07 LAB — HEMOGLOBIN AND HEMATOCRIT, BLOOD
HCT: 33.3 % — ABNORMAL LOW (ref 36.0–46.0)
Hemoglobin: 10.6 g/dL — ABNORMAL LOW (ref 12.0–15.0)

## 2016-03-07 LAB — PHOSPHORUS: PHOSPHORUS: 2.7 mg/dL (ref 2.5–4.6)

## 2016-03-07 LAB — PROCALCITONIN: PROCALCITONIN: 0.25 ng/mL

## 2016-03-07 LAB — PREPARE RBC (CROSSMATCH)

## 2016-03-07 MED ORDER — SODIUM CHLORIDE 0.9 % IV SOLN
Freq: Once | INTRAVENOUS | Status: AC
  Administered 2016-03-07: 11:00:00 via INTRAVENOUS

## 2016-03-07 MED ORDER — DIPHENHYDRAMINE HCL 25 MG PO CAPS
25.0000 mg | ORAL_CAPSULE | Freq: Once | ORAL | Status: AC
  Administered 2016-03-07: 25 mg via ORAL
  Filled 2016-03-07: qty 1

## 2016-03-07 MED ORDER — ACETAMINOPHEN 325 MG PO TABS
650.0000 mg | ORAL_TABLET | Freq: Once | ORAL | Status: AC
  Administered 2016-03-07: 650 mg via ORAL
  Filled 2016-03-07: qty 2

## 2016-03-07 NOTE — Progress Notes (Signed)
Physical Therapy Treatment Patient Details Name: [name] MRN: [id number] DOB: [birth year] Today's Date: 03/07/2016    History of Present Illness Patient is a 50 y/o female s/p L TKA.  PMH includes fibromyalgia, bipolar, SS, HTN, obesity, anxiety, COPD, TSA 07/2015, GERD, PUD, COPD.  Developed SVT and moved to ICU overnight 5/23.    PT Comments    The patient is too lethargic to participate in therapy this PM. ROM to left knee was passive. . Will continue tomorrow.  Follow Up Recommendations  SNF;Supervision/Assistance - 24 hour     Equipment Recommendations       Recommendations for Other Services       Precautions / Restrictions Precautions Precautions: Fall;Knee Precaution Comments: right knee may buckle Required Braces or Orthoses: Knee Immobilizer - Left Restrictions Weight Bearing Restrictions: Yes LLE Weight Bearing: Weight bearing as tolerated    Mobility    Balance   Cognition   Exercises   General Comments    Pertinent Vitals/Pain Too lethargic to report    Home Living                      Prior Function            PT Goals (current goals can now be found in the care plan section) Progress towards PT goals: Not progressing toward goals - comment (too lethargic today)    Frequency  7X/week    PT Plan Current plan remains appropriate    Co-evaluation             End of Session   Activity Tolerance: Patient limited by lethargy Patient left: in bed;with call bell/phone within reach;with bed alarm set     Time: 1341-1352 PT Time Calculation (min) (ACUTE ONLY): 11 min  Charges:  $Therapeutic Exercise: 8-22 mins                    G Codes:      Marcelino Freestone PT [id number]  03/07/2016, 1:55 PM

## 2016-03-07 NOTE — Progress Notes (Signed)
Patient ID: [name], female   DOB: Sep 25, 1966, 50 y.o.   MRN: [id number]     Subjective:  Patient reports pain as mild.  Patient in the bed and in no acute distress requesting to go to SNF  Objective:   VITALS:   Filed Vitals:   03/07/16 0503 03/07/16 0827 03/07/16 1130 03/07/16 1150  BP: 115/63  128/66 113/61  Pulse: 102  101 102  Temp: 98.2 F (36.8 C)  99 F (37.2 C) 99.3 F (37.4 C)  TempSrc: Oral  Oral Oral  Resp: 16  20 22   Height:      Weight:      SpO2: 98% 93% 97% 97%    ABD soft Sensation intact distally Dorsiflexion/Plantar flexion intact Incision: dressing C/D/I and no drainage Wound good and dry dressing applied  Lab Results  Component Value Date   WBC 5.6 03/07/2016   HGB 8.3* 03/07/2016   HCT 26.0* 03/07/2016   MCV 82.5 03/07/2016   PLT 187 03/07/2016   BMET    Component Value Date/Time   NA 139 03/07/2016 0538   K 3.5 03/07/2016 0538   CL 102 03/07/2016 0538   CO2 32 03/07/2016 0538   GLUCOSE 81 03/07/2016 0538   BUN 11 03/07/2016 0538   CREATININE 0.58 03/07/2016 0538   CALCIUM 8.1* 03/07/2016 0538   GFRNONAA >60 03/07/2016 0538   GFRAA >60 03/07/2016 0538     Assessment/Plan: 3 Days Post-Op   Principal Problem:   Primary localized osteoarthritis of left knee Active Problems:   S/P total knee arthroplasty   SVT (supraventricular tachycardia) (HCC)   Atelectasis   Absolute anemia   Arterial hypotension   Pulmonary emphysema (HCC)   Sinus tachycardia (HCC)   GERD (gastroesophageal reflux disease)   Postoperative anemia due to acute blood loss   possible malignant hyperthermia variant post op   Advance diet Up with therapy Discharge to SNF when bed available WBAT Dry dressing PRN    Remonia Richter 03/07/2016, 12:52 PM  Discussed and agree with above.    Marchia Bond, MD Cell [phone]

## 2016-03-07 NOTE — Consult Note (Signed)
PULMONARY / CRITICAL CARE MEDICINE   Name: [name] MRN: [id number] DOB: [birth year]    ADMISSION DATE:  03/04/2016 CONSULTATION DATE:  03/05/16  REFERRING MD:  Mardelle Matte  CHIEF COMPLAINT:  SVT  HISTORY OF PRESENT ILLNESS:  [name] is a 50 y.o. 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  Surgery went well without complication.  Post op she had tachycardia around 110 for most of the day, then the following morning, during early AM hours, she developed HR as high as 240's.  Code blue was called; however, pt never lost pulses and remained alert and oriented throughout; therefore, code cancelled. She was evaluated by rapid response team, code team, and cardiology and decision was made to administer adenosine.  She received 6mg  followed by 12mg  which resulted in a 2 second pause followed by [facility] and recurrent sinus tach.  She was transferred to the ICU and PCCM was consulted.  Of note, she had fever to 103 for which she was started on vanc.  She had CTA performed which did not demonstrate central PE.  SUBJECTIVE:  Anemia, prbc given  VITAL SIGNS: BP 113/61 mmHg  Pulse 102  Temp(Src) 99.3 F (37.4 C) (Oral)  Resp 22  Ht 5\' 2"  (1.575 m)  Wt 132.904 kg (293 lb)  BMI 53.58 kg/m2  SpO2 97%  HEMODYNAMICS:    VENTILATOR SETTINGS:    INTAKE / OUTPUT: I/O last 3 completed shifts: In: 4741.7 [P.O.:1050; I.V.:1906.7; Blood:1335; IV Piggyback:450] Out: 2425 [Urine:2425]   PHYSICAL EXAMINATION: General: Middle aged female, in NAD. Neuro: A&O x 3, non-focal.  HEENT: PERRL Cardiovascular: Tachy resolved s1 s2  Lungs: CTA Abdomen: BS x 4, soft, NT/ND.  Musculoskeletal: Left knee dressings dry Skin: Intact, warm, no rashes.  LABS:  BMET  Recent Labs Lab 03/05/16 1139 03/06/16 0344 03/07/16 0538  NA 134* 138 139  K 3.9 4.0 3.5  CL 99* 101 102  CO2 28 30 32  BUN 6 7 11   CREATININE 0.77 0.60 0.58  GLUCOSE 180* 133* 81     Electrolytes  Recent Labs Lab 03/05/16 1139 03/05/16 2200 03/06/16 0344 03/07/16 0538  CALCIUM 8.1*  --  8.7* 8.1*  MG  --  1.8 1.9  --   PHOS  --   --  2.0* 2.7    CBC  Recent Labs Lab 03/06/16 0344 03/06/16 1540 03/07/16 0538  WBC 9.0 6.4 5.6  HGB 7.1* 6.4* 8.3*  HCT 22.8* 20.8* 26.0*  PLT 190 185 187    Coag's No results for input(s): APTT, INR in the last 168 hours.  Sepsis Markers  Recent Labs Lab 03/05/16 0246 03/05/16 0530 03/05/16 0723 03/06/16 0344 03/07/16 0538  LATICACIDVEN 3.6* 2.8*  --   --   --   PROCALCITON  --   --  0.30 0.35 0.25    ABG No results for input(s): PHART, PCO2ART, PO2ART in the last 168 hours.  Liver Enzymes  Recent Labs Lab 03/05/16 0246  AST 30  ALT 13*  ALKPHOS 37*  BILITOT 0.3  ALBUMIN 2.6*    Cardiac Enzymes  Recent Labs Lab 03/05/16 0246 03/05/16 0723  TROPONINI <0.03 0.03    Glucose  Recent Labs Lab 03/05/16 0737  GLUCAP 138*    Imaging No results found.   STUDIES:  CTA 05/24 > no central PE.  Scattered atelectasis.  Questionable peripancreatic soft tissue stranding, ? Pancreatitis. CXR 05/24 > no acute process.  CULTURES: Blood 05/24 >  ANTIBIOTICS: Vanc 05/24 >>>5/25 ceftaz 5/24>>>5/26  SIGNIFICANT EVENTS: 05/23 > left TKA. 05/24 > transferred to ICU for sinus tach. 5/25- pos balance, fever control, improved, no further svt  LINES/TUBES: None.  DISCUSSION: 50 y.o. F who underwent left TKA on 05/23.  Had sinus tach post op which worsened during early AM hours the following day with HR as high as 240's.  She was given adenosine which had some effect, but pt remained in SVT so was transferred to the ICU for further monitoring.  ASSESSMENT / PLAN:  CARDIOVASCULAR A:  SVT - s/p 6mg  and 12mg  adenosine with some decrease in HR but now remains in SVT.  Likely related to fever, favored to be due to atelectasis.  CTA negative for PE. Borderline hypotension Tachy from fevers Hx  HTN, sCHF (Echo from March 2015 with EF 55-65%). Repeat echo 65%-70% - hypovolemic P:  Avoid fevers Tele to remain  PULMONARY A: Acute hypoxic respiratory failure. Post op atelectasis. COPD by report - no PFT's in system. Tobacco dependence. P:   No edema, allow pos to even balance May need lasix with prbc, if desaturation  INFECTIOUS A:   Fever post op - unclear etiology at this point.  Favor atelectasis.  Doubt post op surgical infection. R./o aspiration intraop? Vs transient bacteremia? Unclear Drug reaction, r/o MH PCT low trend P:   No infection noted, culture neg, dc all abx Have d/w anesthesia to re evaluate her MH? And be able to review her meds given and risk in future  RENAL A:   AKI. hypophos P:   NS @ 48 - would kvo for sure with prbc given Chem in am  For K recommended  GASTROINTESTINAL A:   Questionable peripancreatic soft tissue stranding - ? Pancreatitis. GERD. Obesity. Nutrition. Hx PUD. P:   Famotidine home med, keep diet   HEMATOLOGIC / ONCOLOGIC A:   Anemia dilution / loss in OR Hx uterine CA - s/p hysterectomy. VTE Prophylaxis, high risk P:  Transfuse for Hgb < 7, consider holding further Tx prbc NO bleeding clinically SCD's. Xaraelto, monitor abdo if hct drops further Reduce fluids- kvo  NEUROLOGIC A:   Post op pain. Hx fibromyalgia, bipolar 1, spinal stenosis, anxiety, depression. R./o varient malignant hyperthermia P:   Anesthesia to re evaluate for futrue surgery    Family updated: Daughter updated  Interdisciplinary Family Meeting v Palliative Care Meeting:  Due by: 05/31.  Will sign off, call if needed  Lavon Paganini. Titus Mould, MD, [place] Pgr: [facility]

## 2016-03-07 NOTE — Clinical Social Work Note (Signed)
Clinical Social Work Assessment  Patient Details  Name: [name] MRN: [id number] Date of Birth: Jul 06, 1966  Date of referral:  03/07/16               Reason for consult:  Facility Placement, Discharge Planning                Permission sought to share information with:  Facility Sport and exercise psychologist, Family Supports Permission granted to share information::  Yes, Verbal Permission Granted  Name::     [place], Paramedic::  SNF's  Relationship::  Daughters  Contact Information:  G: [phone], C: [phone]  Housing/Transportation Living arrangements for the past 2 months:  Single Family Home Source of Information:  Patient Patient Interpreter Needed:  None Criminal Activity/Legal Involvement Pertinent to Current Situation/Hospitalization:  No - Comment as needed Significant Relationships:  Adult Children, Siblings Lives with:  Siblings Do you feel safe going back to the place where you live?  Yes Need for family participation in patient care:  Yes (Comment)  Care giving concerns:  PT recommending SNF at discharge.   Social Worker assessment / plan:  CSW met with patient. No supports at bedside. CSW introduced role and explained that discharge planning would be discussed. CSW confirmed SNF placement and provided SNF list. Patient gave verbal permission to fax out information to SNF's in [place] area that accept her insurance. No further concerns. CSW encouraged patient to contact CSW as needed. CSW will continue to follow patient and facilitate discharge to SNF once medically stable.  Employment status:  Retired Nurse, adult PT Recommendations:  [place] / Referral to community resources:  [place]  Patient/Family's Response to care:  Patient agreeable to SNF recommendation. Patient's family reportedly supportive and involved in patient's care. Patient polite and appreciated social work  intervention.  Patient/Family's Understanding of and Emotional Response to Diagnosis, Current Treatment, and Prognosis:  Patient knowledgeable of medical interventions and aware of recommendation for SNF placement once medically stable for discharge.  Emotional Assessment Appearance:  Appears stated age Attitude/Demeanor/Rapport:  Other (Pleasant) Affect (typically observed):  Accepting, Appropriate, Calm, Pleasant Orientation:  Oriented to Self, Oriented to Place, Oriented to  Time, Oriented to Situation Alcohol / Substance use:  Tobacco Use Psych involvement (Current and /or in the community):  Outpatient Provider  Discharge Needs  Concerns to be addressed:  Care Coordination Readmission within the last 30 days:  No Current discharge risk:  Dependent with Mobility, Psychiatric Illness Barriers to Discharge:  [place], LCSW 03/07/2016, 4:06 PM

## 2016-03-07 NOTE — Progress Notes (Signed)
hgb 8.  Cards reccommended transfusion another 2 units.   Possible SNF tomorrow if stabilizes and doing ok.    Johnny Bridge, MD

## 2016-03-07 NOTE — Discharge Summary (Addendum)
Physician Discharge Summary  Patient ID: [name] MRN: [id number] DOB/AGE: December 14, 1965 50 y.o.  Admit date: 03/04/2016 Discharge date: 03/12/2016  Admission Diagnoses:  Primary localized osteoarthritis of left knee  Discharge Diagnoses:  Principal Problem:   Primary localized osteoarthritis of left knee Active Problems:   S/P total knee arthroplasty   SVT (supraventricular tachycardia) (HCC)   Atelectasis   Absolute anemia   Arterial hypotension   Pulmonary emphysema (HCC)   Sinus tachycardia (HCC)   GERD (gastroesophageal reflux disease)   Postoperative anemia due to acute blood loss   possible malignant hyperthermia variant post op   Past Medical History  Diagnosis Date  . Fibromyalgia   . Bipolar 1 disorder ([place])   . Spinal stenosis   . Hypertension   . Obesity   . GERD (gastroesophageal reflux disease)   . PUD (peptic ulcer disease)   . Anxiety   . COPD (chronic obstructive pulmonary disease) ([place])   . Pneumonia     developed in 07/2015- post shoulder replacement   . Depression   . Osteoarthritis     knees, elbows   . 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 Cancer ([place])     uterine CA/- resulted in hysterectomy, pt. reports that she is cured   . Primary localized osteoarthritis of left knee 03/04/2016  . possible malignant hyperthermia variant post op 03/07/2016    Surgeries: Procedure(s): TOTAL KNEE ARTHROPLASTY on 03/04/2016   Consultants (if any):    Discharged Condition: Improved  Hospital Course: [name] is an 50 y.o. female who was admitted 03/04/2016 with a diagnosis of Primary localized osteoarthritis of left knee and went to the operating room on 03/04/2016 and underwent the above named procedures.    She was given perioperative antibiotics:      Anti-infectives    Start     Dose/Rate Route Frequency Ordered Stop   03/05/16 1630  vancomycin (VANCOCIN) IVPB 1000 mg/200 mL premix  Status:  Discontinued     1,000 mg 200 mL/hr  over 60 Minutes Intravenous Every 12 hours 03/05/16 0420 03/06/16 0900   03/05/16 1015  cefTAZidime (FORTAZ) 2 g in dextrose 5 % 50 mL IVPB  Status:  Discontinued     2 g 100 mL/hr over 30 Minutes Intravenous Every 8 hours 03/05/16 1006 03/07/16 1327   03/05/16 0430  vancomycin (VANCOCIN) 2,000 mg in sodium chloride 0.9 % 500 mL IVPB     2,000 mg 250 mL/hr over 120 Minutes Intravenous  Once 03/05/16 0420 03/05/16 0730   03/04/16 1400  ceFAZolin (ANCEF) IVPB 2g/100 mL premix     2 g 200 mL/hr over 30 Minutes Intravenous Every 6 hours 03/04/16 1151 03/04/16 2101   03/04/16 0700  ceFAZolin (ANCEF) 3 g in dextrose 5 % 50 mL IVPB     3 g 130 mL/hr over 30 Minutes Intravenous To [facility] 03/03/16 1409 03/04/16 0815    .  She was given sequential compression devices, early ambulation, and xarelto for DVT prophylaxis.  Post op she had tachycardia around 110 for most of the day, then the following morning, during early AM hours, she developed HR as high as 240's. Code blue was called; however, pt never lost pulses and remained alert and oriented throughout; therefore, code cancelled. She was evaluated by rapid response team, code team, and cardiology and decision was made to administer adenosine. She received 6mg  followed by 12mg  which resulted in a 2 second pause followed by [facility] and recurrent sinus tach.  She was transferred to the ICU and PCCM was consulted.  Of note, she had fever to 103 for which she was started on vanc. She had CTA performed which did not demonstrate central PE.  There was a consideration of possible malignant hyperthermia post op variant, Dr. Titus Mould was investigating.   She also had ABLA and was transfused 4 units PRBCs to get Hgb to around 10 based on cardiology recs..   She also has chronic pain and chronic narcotic dependency, and is prescribed significant narcotics to control her post-op pain.   She benefited maximally from the hospital stay and there  were no complications despite her ICU stay and complicated post op course.Marland Kitchen Her transfer was delayed due to the holiday and receiving at the facility and to optimize her compliance with PT/OT.     Recent vital signs:  Filed Vitals:   03/11/16 1929 03/12/16 0605  BP: 116/69 121/66  Pulse: 80 78  Temp: 98.7 F (37.1 C) 98.9 F (37.2 C)  Resp: 17 18    Recent laboratory studies:  Lab Results  Component Value Date   HGB 10.6* 03/07/2016   HGB 8.3* 03/07/2016   HGB 6.4* 03/06/2016   Lab Results  Component Value Date   WBC 5.6 03/07/2016   PLT 187 03/07/2016   Lab Results  Component Value Date   INR 1.17 07/19/2015   Lab Results  Component Value Date   NA 138 03/08/2016   K 3.3* 03/08/2016   CL 103 03/08/2016   CO2 28 03/08/2016   BUN 8 03/08/2016   CREATININE 0.61 03/08/2016   GLUCOSE 115* 03/08/2016    Discharge Medications:     Medication List    TAKE these medications        ALPRAZolam 1 MG tablet  Commonly known as:  XANAX  Take 1 mg by mouth 3 (three) times daily.     amitriptyline 50 MG tablet  Commonly known as:  ELAVIL  TAKE 1 TABLET BY MOUTH EVERY NIGHT AT BEDTIME     aspirin 81 MG chewable tablet  Chew 81 mg by mouth at bedtime.     baclofen 10 MG tablet  Commonly known as:  LIORESAL  Take 1 tablet (10 mg total) by mouth 3 (three) times daily as needed for muscle spasms.     divalproex 500 MG 24 hr tablet  Commonly known as:  DEPAKOTE ER  Take 1,000 mg by mouth every evening.     DULoxetine 60 MG capsule  Commonly known as:  CYMBALTA  TAKE ONE CAPSULE BY MOUTH DAILY     hydrochlorothiazide 12.5 MG tablet  Commonly known as:  HYDRODIURIL  Take 12.5 mg by mouth daily.     HYDROmorphone 2 MG tablet  Commonly known as:  DILAUDID  Take 1 tablet (2 mg total) by mouth every 4 (four) hours as needed for severe pain.     lidocaine 5 %  Commonly known as:  LIDODERM  Place 1 patch onto the skin every 12 (twelve) hours. Remove & Discard patch  within 12 hours or as directed     nitroGLYCERIN 0.4 MG SL tablet  Commonly known as:  NITROSTAT  Place 0.4 mg under the tongue every 5 (five) minutes as needed for chest pain.     oxyCODONE 15 MG immediate release tablet  Commonly known as:  ROXICODONE  Take 1 tablet (15 mg total) by mouth every 4 (four) hours as needed for pain.     polyethylene glycol packet  Commonly known as:  MIRALAX / GLYCOLAX  Take 17 g by mouth daily as needed for mild constipation.     PROAIR HFA 108 (90 Base) MCG/ACT inhaler  Generic drug:  albuterol  Inhale 2 puffs into the lungs every 6 (six) hours as needed for wheezing or shortness of breath.     ranitidine 150 MG tablet  Commonly known as:  ZANTAC  Take 150 mg by mouth 2 (two) times daily as needed for heartburn.     rivaroxaban 10 MG Tabs tablet  Commonly known as:  XARELTO  Take 1 tablet (10 mg total) by mouth daily.     sennosides-docusate sodium 8.6-50 MG tablet  Commonly known as:  SENOKOT-S  Take 2 tablets by mouth daily.        Diagnostic Studies: Dg Chest 2 View  02/20/2016  CLINICAL DATA:  Preop testing for knee arthroplasty EXAM: CHEST  2 VIEW COMPARISON:  07/18/2015 FINDINGS: Normal heart size and mediastinal contours. No infiltrate or edema. No effusion or pneumothorax. Left glenohumeral arthroplasty with stable alignment since prior. IMPRESSION: No active cardiopulmonary disease. Electronically Signed   By: Monte Fantasia M.D.   On: 02/20/2016 14:19   Ct Angio Chest Pe W/cm &/or Wo Cm  03/05/2016  CLINICAL DATA:  Tachycardia. EXAM: CT ANGIOGRAPHY CHEST WITH CONTRAST TECHNIQUE: Multidetector CT imaging of the chest was performed using the standard protocol during bolus administration of intravenous contrast. Multiplanar CT image reconstructions and MIPs were obtained to evaluate the vascular anatomy. CONTRAST:  100 cc Isovue 370 IV COMPARISON:  Radiograph earlier this day.  Chest CT 12/26/2013 FINDINGS: Suboptimal contrast timing  bolus, no pulmonary embolus to the level of the segmental branches. Subsegmental branches cannot be assessed. The thoracic aorta is normal in caliber. No aortic dissection. No mediastinal or hilar adenopathy. No pericardial effusion. Linear atelectasis within both lower lobes and dependent right upper lobe. No consolidation to suggest pneumonia. No pulmonary edema. No pleural effusion. Evaluation of the upper abdomen demonstrates hepatic steatosis. There is questionable peripancreatic edema versus motion. There are no acute or suspicious osseous abnormalities. Review of the MIP images confirms the above findings. IMPRESSION: 1. No central pulmonary embolus.  Cannot assess subsegmental levels. 2. Scattered atelectasis in both lungs, no pneumonia. 3. Question of peripancreatic soft tissue stranding in the upper abdomen. Can be seen with acute pancreatitis. Motion artifact is felt less likely. Recommend correlation with pancreatic enzymes. Electronically Signed   By: Jeb Levering M.D.   On: 03/05/2016 05:10   Dg Chest Port 1 View  03/06/2016  CLINICAL DATA:  Pulmonary edema. EXAM: PORTABLE CHEST 1 VIEW COMPARISON:  CT 03/05/2016.  Chest x-ray 03/05/2016. FINDINGS: Mediastinum and hilar structures normal. Bibasilar subsegmental atelectasis again noted. No pleural effusion or pneumothorax. Heart size stable. Left shoulder replacement. IMPRESSION: Persistent bibasilar subsegmental atelectasis. Electronically Signed   By: [place]   On: 03/06/2016 06:45   Dg Chest Port 1 View  03/05/2016  CLINICAL DATA:  Shortness of breath. EXAM: PORTABLE CHEST 1 VIEW COMPARISON:  Front and lateral views 02/20/2016 FINDINGS: Cardiomediastinal contours are unchanged allowing for differences in technique and positioning. No pulmonary edema, confluent consolidation, pleural effusion or pneumothorax. Linear atelectasis or scarring in the left midlung zone. Left humeral head prosthesis, partially included. IMPRESSION: No  active disease. Electronically Signed   By: Jeb Levering M.D.   On: 03/05/2016 02:48   Dg Knee Left Port  03/04/2016  CLINICAL DATA:  Status post total knee replacement EXAM: PORTABLE LEFT  KNEE - 1-2 VIEW COMPARISON:  10/06/2015 FINDINGS: Tricompartmental knee replacement with components in anticipated position. There is anticipated postoperative soft tissue change in the joint and anteriorly. IMPRESSION: Anticipated postoperative appearance Electronically Signed   By: Skipper Cliche M.D.   On: 03/04/2016 11:28    Disposition: SNF    Follow-up Information    Follow up with Johnny Bridge, MD. Schedule an appointment as soon as possible for a visit in 2 weeks.   Specialty:  Orthopedic Surgery   Contact information:   [place] [address] [phone]       Follow up with [facility] .   Specialty:  [place] information:   [address] [place] [phone]       Signed: Johnny Bridge 03/12/2016, 12:54 PM

## 2016-03-07 NOTE — Clinical Social Work Placement (Signed)
   CLINICAL SOCIAL WORK PLACEMENT  NOTE  Date:  03/07/2016  Patient Details  Name: TAKENDRA HUGIE MRN: [id number] Date of Birth: 30-Apr-1966  Clinical Social Work is seeking post-discharge placement for this patient at the [place] level of care (*CSW will initial, date and re-position this form in  chart as items are completed):  Yes   Patient/family provided with [facility] Work Department's list of facilities offering this level of care within the geographic area requested by the patient (or if unable, by the patient's family).  Yes   Patient/family informed of their freedom to choose among providers that offer the needed level of care, that participate in Medicare, Medicaid or managed care program needed by the patient, have an available bed and are willing to accept the patient.  Yes   Patient/family informed of [facility]'s ownership interest in [facility] and [facility], as well as of the fact that they are under no obligation to receive care at these facilities.  PASRR submitted to EDS on 03/07/16     PASRR number received on       Existing PASRR number confirmed on       FL2 transmitted to all facilities in geographic area requested by pt/family on       FL2 transmitted to all facilities within larger geographic area on       Patient informed that his/her managed care company has contracts with or will negotiate with certain facilities, including the following:            Patient/family informed of bed offers received.  Patient chooses bed at       Physician recommends and patient chooses bed at      Patient to be transferred to   on  .  Patient to be transferred to facility by       Patient family notified on   of transfer.  Name of family member notified:        PHYSICIAN       Additional Comment:    _______________________________________________ Candie Chroman, LCSW 03/07/2016, 4:13 PM

## 2016-03-08 LAB — TYPE AND SCREEN
ABO/RH(D): O POS
Antibody Screen: NEGATIVE
UNIT DIVISION: 0
UNIT DIVISION: 0
Unit division: 0
Unit division: 0

## 2016-03-08 LAB — BASIC METABOLIC PANEL
ANION GAP: 7 (ref 5–15)
BUN: 8 mg/dL (ref 6–20)
CO2: 28 mmol/L (ref 22–32)
Calcium: 8.3 mg/dL — ABNORMAL LOW (ref 8.9–10.3)
Chloride: 103 mmol/L (ref 101–111)
Creatinine, Ser: 0.61 mg/dL (ref 0.44–1.00)
GFR calc Af Amer: >60 mL/min (ref >60)
Glucose, Bld: 115 mg/dL — ABNORMAL HIGH (ref 65–99)
POTASSIUM: 3.3 mmol/L — AB (ref 3.5–5.1)
SODIUM: 138 mmol/L (ref 135–145)

## 2016-03-08 NOTE — Progress Notes (Addendum)
Subjective: 4 Days Post-Op Procedure(s) (LRB): TOTAL KNEE ARTHROPLASTY (Left) Patient reports pain as mild.    Objective: Vital signs in last 24 hours: Temp:  [97.8 F (36.6 C)-99.3 F (37.4 C)] 97.8 F (36.6 C) (05/27 0444) Pulse Rate:  [90-102] 97 (05/27 0444) Resp:  [18-22] 18 (05/27 0444) BP: (105-135)/(58-75) 110/58 mmHg (05/27 0444) SpO2:  [93 %-100 %] 95 % (05/27 0444)  Intake/Output from previous day: 05/26 0701 - 05/27 0700 In: 660 [Blood:660] Out: -  Intake/Output this shift:     Recent Labs  03/06/16 0344 03/06/16 1540 03/07/16 0538 03/07/16 2148  HGB 7.1* 6.4* 8.3* 10.6*    Recent Labs  03/06/16 1540 03/07/16 0538 03/07/16 2148  WBC 6.4 5.6  --   RBC 2.48* 3.15*  --   HCT 20.8* 26.0* 33.3*  PLT 185 187  --     Recent Labs  03/06/16 0344 03/07/16 0538  NA 138 139  K 4.0 3.5  CL 101 102  CO2 30 32  BUN 7 11  CREATININE 0.60 0.58  GLUCOSE 133* 81  CALCIUM 8.7* 8.1*   No results for input(s): LABPT, INR in the last 72 hours.  Neurovascular intact Incision: dressing C/D/I Assisted pt to chair, minimal assist with transfer  Assessment/Plan: 4 Days Post-Op Procedure(s) (LRB): TOTAL KNEE ARTHROPLASTY (Left)  Up with therapy Discharge to SNF when bed available WBAT Dry dressing PRN  Marquetta Weiskopf 03/08/2016, 7:05 AM

## 2016-03-08 NOTE — NC FL2 (Signed)
[place] LEVEL OF CARE SCREENING TOOL     IDENTIFICATION  Patient Name: [name] Birthdate: 11/22/1965 Sex: female Admission Date (Current Location): 03/04/2016  [facility] and [place] Number:  Herbalist and Address:  [facility]. [facility] [place] [address], [place], [place] [address]      Provider Number:    Attending Physician Name and Address:  Marchia Bond, MD  Relative Name and Phone Number:       Current Level of Care: SNF Recommended Level of Care: [place] Prior Approval Number:    Date Approved/Denied:   PASRR Number:    Discharge Plan: SNF    Current Diagnoses: Patient Active Problem List   Diagnosis Date Noted  . Postoperative anemia due to acute blood loss 03/07/2016  . 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 Sinus tachycardia ([place]) 03/05/2016  . GERD (gastroesophageal reflux disease) 03/05/2016  . SVT (supraventricular tachycardia) ([place])   . Atelectasis   . Absolute anemia   . Arterial hypotension   . Pulmonary emphysema ([place])   . Primary localized osteoarthritis of left knee 03/04/2016  . S/P total knee arthroplasty 03/04/2016  . Sepsis ([place]) 07/19/2015  . Tobacco abuse 07/19/2015  . COPD (chronic obstructive pulmonary disease) ([place]) 07/19/2015  . S/P shoulder replacement   . Tachycardia   . Tremor of both hands 02/28/2015  . Lumbar facet arthropathy 02/28/2015  . Chest pain at rest 12/25/2013  . Chronic pain disorder 12/25/2013  . Chest pain due to psychological stress 12/25/2013  . Insomnia, unspecified 10/04/2013  . Insomnia 08/03/2013  . 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 Unspecified hereditary and idiopathic peripheral neuropathy 05/10/2013  . Lumbosacral spondylosis without myelopathy 05/10/2013  . Osteoarthritis of both knees 05/10/2013  . Left rotator cuff tear arthropathy 05/10/2013  . RESTLESS LEG SYNDROME 05/26/2009  . HYPERSOMNIA,  ASSOCIATED WITH SLEEP APNEA 03/28/2009  . Anxiety state 02/23/2009  . Depression 02/23/2009  . ARTHRITIS 02/23/2009  . DYSPHAGIA UNSPECIFIED 02/23/2009  . CERVICAL CANCER, HX OF 02/23/2009  . Bipolar disorder ([place]) 02/21/2009  . GERD 02/21/2009  . IRRITABLE BOWEL SYNDROME 02/21/2009  . NECK PAIN 02/21/2009  . Myalgia and myositis 02/21/2009    Orientation RESPIRATION BLADDER Height & Weight     Self, Time, Situation, Place  Normal Continent Weight: 293 lb (132.904 kg) Height:  5\' 2"  (157.5 cm)  BEHAVIORAL SYMPTOMS/MOOD NEUROLOGICAL BOWEL NUTRITION STATUS      Continent    AMBULATORY STATUS COMMUNICATION OF NEEDS Skin   Limited Assist Verbally Surgical wounds                       Personal Care Assistance Level of Assistance  Bathing, Dressing Bathing Assistance: Limited assistance   Dressing Assistance: Limited assistance     Functional Limitations Info             SPECIAL CARE FACTORS FREQUENCY                       Contractures      Additional Factors Info  Allergies               Current Medications (03/08/2016):  This is the current hospital active medication list Current Facility-Administered Medications  Medication Dose Route Frequency Provider Last Rate Last Dose  . 0.9 %  sodium chloride infusion   Intravenous Continuous Raylene Miyamoto, MD 50 mL/hr at 03/07/16 0423    .  acetaminophen (TYLENOL) tablet 650 mg  650 mg Oral Q6H PRN Marchia Bond, MD   650 mg at 03/05/16 2001   Or  . acetaminophen (TYLENOL) suppository 650 mg  650 mg Rectal Q6H PRN Marchia Bond, MD   650 mg at 03/05/16 0208  . ALPRAZolam Duanne Moron) tablet 1 mg  1 mg Oral TID Marchia Bond, MD   1 mg at 03/07/16 2204  . alum & mag hydroxide-simeth (MAALOX/MYLANTA) 200-200-20 MG/5ML suspension 30 mL  30 mL Oral Q4H PRN Marchia Bond, MD      . antiseptic oral rinse (CPC / CETYLPYRIDINIUM CHLORIDE 0.05%) solution 7 mL  7 mL Mouth Rinse BID Marchia Bond, MD   7 mL at 03/07/16  2205  . aspirin chewable tablet 81 mg  81 mg Oral QHS Marchia Bond, MD   81 mg at 03/07/16 2204  . bisacodyl (DULCOLAX) suppository 10 mg  10 mg Rectal Daily PRN Marchia Bond, MD      . budesonide (PULMICORT) nebulizer solution 0.5 mg  0.5 mg Nebulization BID Jose Shirl Harris, MD   0.5 mg at 03/07/16 0826  . diphenhydrAMINE (BENADRYL) 12.5 MG/5ML elixir 12.5-25 mg  12.5-25 mg Oral Q4H PRN Marchia Bond, MD      . divalproex (DEPAKOTE ER) 24 hr tablet 1,000 mg  1,000 mg Oral QPM Marchia Bond, MD   1,000 mg at 03/07/16 1908  . docusate sodium (COLACE) capsule 100 mg  100 mg Oral BID Marchia Bond, MD   100 mg at 03/07/16 2205  . DULoxetine (CYMBALTA) DR capsule 60 mg  60 mg Oral Daily Marchia Bond, MD   60 mg at 03/07/16 1016  . famotidine (PEPCID) tablet 20 mg  20 mg Oral BID Marchia Bond, MD   20 mg at 03/07/16 2205  . fentaNYL (SUBLIMAZE) injection 25-100 mcg  25-100 mcg Intravenous Q1H PRN Rahul P Desai, PA-C      . levalbuterol (XOPENEX) nebulizer solution 0.63 mg  0.63 mg Nebulization Q3H PRN Rahul P Desai, PA-C      . magnesium citrate solution 1 Bottle  1 Bottle Oral Once PRN Marchia Bond, MD      . menthol-cetylpyridinium (CEPACOL) lozenge 3 mg  1 lozenge Oral PRN Marchia Bond, MD       Or  . phenol (CHLORASEPTIC) mouth spray 1 spray  1 spray Mouth/Throat PRN Marchia Bond, MD      . methocarbamol (ROBAXIN) tablet 500 mg  500 mg Oral Q6H PRN Marchia Bond, MD   500 mg at 03/08/16 [id number]   Or  . methocarbamol (ROBAXIN) 500 mg in dextrose 5 % 50 mL IVPB  500 mg Intravenous Q6H PRN Marchia Bond, MD      . metoCLOPramide (REGLAN) tablet 5-10 mg  5-10 mg Oral Q8H PRN Marchia Bond, MD       Or  . metoCLOPramide (REGLAN) injection 5-10 mg  5-10 mg Intravenous Q8H PRN Marchia Bond, MD      . ondansetron [facility]) tablet 4 mg  4 mg Oral Q6H PRN Marchia Bond, MD       Or  . ondansetron [facility]) injection 4 mg  4 mg Intravenous Q6H PRN Marchia Bond, MD      . oxyCODONE (Oxy  IR/ROXICODONE) immediate release tablet 15 mg  15 mg Oral Q4H PRN Marchia Bond, MD   15 mg at 03/08/16 0311  . oxyCODONE (OXYCONTIN) 12 hr tablet 10 mg  10 mg Oral Q12H Marchia Bond, MD   10 mg at 03/07/16  2204  . polyethylene glycol (MIRALAX / GLYCOLAX) packet 17 g  17 g Oral Daily PRN Marchia Bond, MD      . rivaroxaban [facility]ved[facility] Re[facility]) tablet 10 mg  10 mg Oral Q breakfast Marchia Bond, MD   10 mg at 03/07/16 1016  . senna (SENOKOT) tablet 8.6 mg  1 tablet Oral BID Marchia Bond, MD   8.6 mg at 03/07/16 2204     Discharge Medications: Please see discharge summary for a list of discharge medications.  Relevant Imaging Results:  Relevant Lab Results:   Additional Information    Moshe Cipro Berneice Heinrich, LCSW

## 2016-03-08 NOTE — Progress Notes (Signed)
Still awaiting PASAAR.  Bernita Raisin, [place] Social Work [phone]

## 2016-03-08 NOTE — Progress Notes (Signed)
Physical Therapy Treatment Patient Details Name: [name] MRN: [id number] DOB: 07/15/1966 Today's Date: 03/08/2016    History of Present Illness Patient is a 50 y/o female s/p L TKA.  PMH includes fibromyalgia, bipolar, SS, HTN, obesity, anxiety, COPD, TSA 07/2015, GERD, PUD, COPD.  Developed SVT and moved to ICU overnight 5/23.    PT Comments    Pt is making steady progress toward goals. Acute PT to continue during pt's hospital stay working on strengthening and progressing mobility.   Follow Up Recommendations  SNF;Supervision/Assistance - 24 hour     Equipment Recommendations  Rolling walker with 5" wheels    Precautions / Restrictions Precautions Precautions: Fall;Knee Precaution Comments: right knee may buckle Required Braces or Orthoses: Knee Immobilizer - Left Restrictions LLE Weight Bearing: Weight bearing as tolerated    Mobility  Bed Mobility Overal bed mobility: Needs Assistance Bed Mobility: Supine to Sit;Sit to Supine     Supine to sit: Min guard;HOB elevated Sit to supine: Min assist;HOB elevated   General bed mobility comments: pt able to get herself seated at edge of bed with use of rail and HOB elevated ~35 degrees. Pt needed min assist to get left leg back into bed with HOB down more and no rails used.  Transfers Overall transfer level: Needs assistance Equipment used: Rolling walker (2 wheeled) Transfers: Sit to/from Stand Sit to Stand: Min guard         General transfer comment: min guard assist for sit<>stand to<>from bed and toilet in bathroom with cues on hand placement for safety.  Ambulation/Gait Ambulation/Gait assistance: Min guard Ambulation Distance (Feet): 15 Feet (x2) Assistive device: Rolling walker (2 wheeled) Gait Pattern/deviations: Step-to pattern;Ataxic;Decreased stride length Gait velocity: decreased Gait velocity interpretation: Below normal speed for age/gender General Gait Details: cues on posture and walker  position with gait. no buckling of either knee with gait noted (KI not able to be located in room). Pt with heavy reliance on RW with gait       Cognition Arousal/Alertness: Lethargic Behavior During Therapy: WFL for tasks assessed/performed Overall Cognitive Status: Within Functional Limits for tasks assessed             Exercises Total Joint Exercises Ankle Circles/Pumps: AROM;Both;10 reps;Strengthening;Supine Quad Sets: AROM;Strengthening;Left;Supine Heel Slides: AAROM;Strengthening;Left;Supine Hip ABduction/ADduction: AAROM;Strengthening;Left;10 reps;Supine Straight Leg Raises: AAROM;Strengthening;Left;Supine    General Comments        Pertinent Vitals/Pain Pain Assessment: 0-10 Pain Score: 5  Pain Location: left knee Pain Descriptors / Indicators: Aching;Tender;Sore Pain Intervention(s): Limited activity within patient's tolerance;Monitored during session;Premedicated before session;Repositioned     PT Goals (current goals can now be found in the care plan section) Acute Rehab PT Goals Patient Stated Goal: To go home  PT Goal Formulation: With patient Time For Goal Achievement: 03/11/16 Potential to Achieve Goals: Good Progress towards PT goals: Progressing toward goals    Frequency  7X/week    PT Plan Current plan remains appropriate    End of Session Equipment Utilized During Treatment: Gait belt Activity Tolerance: Patient tolerated treatment well (lethargic with exercises, more alert with gait) Patient left: in bed;with call bell/phone within reach     Time: 1540-1605 PT Time Calculation (min) (ACUTE ONLY): 25 min  Charges:  $Gait Training: 8-22 mins $Therapeutic Exercise: 8-22 mins            Willow Ora 03/08/2016, 6:54 PM  Willow Ora, PTA, CLT Acute Rehab Services [phone] 03/08/2016, 7:01 PM

## 2016-03-08 NOTE — Progress Notes (Signed)
   Late entry for treatment note for 03/07/16 1151  PT Visit Information  Assistance Needed +1  History of Present Illness Patient is a 50 y/o female s/p L TKA.  PMH includes fibromyalgia, bipolar, SS, HTN, obesity, anxiety, COPD, TSA 07/2015, GERD, PUD, COPD.  Developed SVT and moved to ICU overnight 5/23.  Precautions  Precautions Fall;Knee  Precaution Comments right knee may buckle  Required Braces or Orthoses Knee Immobilizer - Left  Cognition  Arousal/Alertness Lethargic  Behavior During Therapy [facility] for tasks assessed/performed  Bed Mobility  Overal bed mobility Needs Assistance  Sit to supine Min assist  General bed mobility comments assist to raise LLE onto bed  Transfers  Overall transfer level Needs assistance  Equipment used Rolling walker (2 wheeled)  Transfers Sit to/from Stand  Sit to Stand Mod assist  Stand pivot transfers Mod assist  General transfer comment continues to need cues for safe use of RW/hand placement; standing erect, patient is very lethargic, keeping eyes closed. cues for safety. KI not in place. nsg had transferred to [facility].Assisted back to bed  PT - End of Session  Activity Tolerance Patient limited by lethargy  Patient left in bed;with call bell/phone within reach;with bed alarm set;with nursing/sitter in room  Nurse Communication Mobility status  PT - Assessment/Plan  PT Plan Current plan remains appropriate  PT Frequency (ACUTE ONLY) 7X/week  Follow Up Recommendations SNF  PT equipment Rolling walker with 5" wheels  PT Goal Progression  Progress towards PT goals Progressing toward goals  Tresa Endo PT [phone]

## 2016-03-09 NOTE — Progress Notes (Signed)
Subjective: 5 Days Post-Op Procedure(s) (LRB): TOTAL KNEE ARTHROPLASTY (Left) Patient reports pain as mild.    Objective: Vital signs in last 24 hours: Temp:  [98.1 F (36.7 C)-98.8 F (37.1 C)] 98.4 F (36.9 C) (05/28 0503) Pulse Rate:  [86-97] 94 (05/28 0503) Resp:  [18] 18 (05/28 0503) BP: (107-132)/(67-77) 117/77 mmHg (05/28 0503) SpO2:  [94 %-98 %] 96 % (05/28 0834)  Intake/Output from previous day: 05/27 0701 - 05/28 0700 In: 880 [P.O.:880] Out: -  Intake/Output this shift:     Recent Labs  03/06/16 1540 03/07/16 0538 03/07/16 2148  HGB 6.4* 8.3* 10.6*    Recent Labs  03/06/16 1540 03/07/16 0538 03/07/16 2148  WBC 6.4 5.6  --   RBC 2.48* 3.15*  --   HCT 20.8* 26.0* 33.3*  PLT 185 187  --     Recent Labs  03/07/16 0538 03/08/16 0534  NA 139 138  K 3.5 3.3*  CL 102 103  CO2 32 28  BUN 11 8  CREATININE 0.58 0.61  GLUCOSE 81 115*  CALCIUM 8.1* 8.3*   No results for input(s): LABPT, INR in the last 72 hours.  Neurovascular intact Sensation intact distally Intact pulses distally Dorsiflexion/Plantar flexion intact Incision: dressing C/D/I  Assessment/Plan: 5 Days Post-Op Procedure(s) (LRB): TOTAL KNEE ARTHROPLASTY (Left) Up with therapy Discharge to SNF  Spoke with SW awaiting PASAAR pt will not be able to d/c until at least Tues due to the holiday weekend.   CPM Pain med as ordered On xarelto dvt proph Dry dsg change prn   Chriss Czar 03/09/2016, 8:38 AM

## 2016-03-09 NOTE — Progress Notes (Signed)
Orthopedic Tech Progress Note Patient Details:  [name] 06-12-1966 [id number]  CPM Left Knee CPM Left Knee: On Left Knee Flexion (Degrees): 60 Left Knee Extension (Degrees): 0   [name] 03/09/2016, 10:10 AM

## 2016-03-09 NOTE — Progress Notes (Signed)
Physical Therapy Treatment Patient Details Name: [name] MRN: [id number] DOB: 1965/11/04 Today's Date: 03/09/2016    History of Present Illness Patient is a 50 y/o female s/p L TKA.  PMH includes fibromyalgia, bipolar, SS, HTN, obesity, anxiety, COPD, TSA 07/2015, GERD, PUD, COPD.  Developed SVT and moved to ICU overnight 5/23.    PT Comments    Today's session limited to exercises only due to pt fatigue/lethargy. Needed to wake pt up to redirect to task multiple times during exercises. Cryo applied to knee due to pt reporting tightness and pain with exercises. Acute PT to continue during pt's hospital stay.   Follow Up Recommendations  SNF;Supervision/Assistance - 24 hour     Equipment Recommendations  Rolling walker with 5" wheels    Precautions / Restrictions Precautions Precautions: Fall;Knee Precaution Comments: right knee may buckle Required Braces or Orthoses: Knee Immobilizer - Left Restrictions LLE Weight Bearing: Weight bearing as tolerated    Cognition Arousal/Alertness: Lethargic Behavior During Therapy: WFL for tasks assessed/performed Overall Cognitive Status: Within Functional Limits for tasks assessed             Exercises Total Joint Exercises Ankle Circles/Pumps: AROM;Strengthening;Both;10 reps;Supine Quad Sets: AROM;Strengthening;Left;10 reps;Supine Heel Slides: AAROM;Strengthening;Left;10 reps;Supine Hip ABduction/ADduction: AAROM;Strengthening;Left;10 reps;Supine Straight Leg Raises: AAROM;Strengthening;Left;10 reps;Supine Goniometric ROM: left knee active flexion 65 degrees in supine position.     Pertinent Vitals/Pain Pain Assessment: 0-10 Pain Score: 6  Pain Location: left knee Pain Descriptors / Indicators: Aching;Sore;Tightness;Tender Pain Intervention(s): Monitored during session;Ice applied;Premedicated before session     PT Goals (current goals can now be found in the care plan section) Acute Rehab PT Goals Patient Stated  Goal: To go home  PT Goal Formulation: With patient Time For Goal Achievement: 03/11/16 Potential to Achieve Goals: Good Progress towards PT goals: Progressing toward goals    Frequency  7X/week    PT Plan Current plan remains appropriate    End of Session   Activity Tolerance: Patient tolerated treatment well;Patient limited by lethargy Patient left: in bed;with call bell/phone within reach     Time: 1314-1331 PT Time Calculation (min) (ACUTE ONLY): 17 min  Charges:  $Therapeutic Exercise: 8-22 mins           Willow Ora 03/09/2016, 1:39 PM  Willow Ora, PTA, CLT Acute Rehab Services [phone] 03/09/2016, 1:39 PM

## 2016-03-09 NOTE — Progress Notes (Signed)
Still awaiting PASAR for placement.  [name] LCSWA [phone]

## 2016-03-10 LAB — CULTURE, BLOOD (ROUTINE X 2)
CULTURE: NO GROWTH
Culture: NO GROWTH

## 2016-03-10 NOTE — Progress Notes (Signed)
Orthopedic Tech Progress Note Patient Details:  [name] [birth year] [id number]  CPM Left Knee CPM Left Knee: On Left Knee Flexion (Degrees): 60 Left Knee Extension (Degrees): 0   [name] 03/10/2016, 3:20 PM

## 2016-03-10 NOTE — Progress Notes (Signed)
Subjective: 6 Days Post-Op Procedure(s) (LRB): TOTAL KNEE ARTHROPLASTY (Left) Patient reports pain as mild.    Objective: Vital signs in last 24 hours: Temp:  [98 F (36.7 C)-98.1 F (36.7 C)] 98 F (36.7 C) (05/29 0509) Pulse Rate:  [83-99] 83 (05/29 0509) Resp:  [18-20] 18 (05/29 0509) BP: (118-119)/(63-73) 119/63 mmHg (05/29 0509) SpO2:  [96 %-99 %] 97 % (05/29 0828)  Intake/Output from previous day: 05/28 0701 - 05/29 0700 In: 600 [P.O.:600] Out: -  Intake/Output this shift:     Recent Labs  03/07/16 2148  HGB 10.6*    Recent Labs  03/07/16 2148  HCT 33.3*    Recent Labs  03/08/16 0534  NA 138  K 3.3*  CL 103  CO2 28  BUN 8  CREATININE 0.61  GLUCOSE 115*  CALCIUM 8.3*   No results for input(s): LABPT, INR in the last 72 hours.  Neurovascular intact Sensation intact distally Intact pulses distally Dorsiflexion/Plantar flexion intact Incision: dressing C/D/I  Assessment/Plan: 6 Days Post-Op Procedure(s) (LRB): TOTAL KNEE ARTHROPLASTY (Left) Up with therapy Discharge to SNF  Spoke with SW awaiting PASAAR pt will not be able to d/c until at least Tues due to the holiday weekend.   CPM Pain med as ordered On xarelto dvt proph Dry dsg change prn   MURPHY, TIMOTHY D 03/10/2016, 3:18 PM

## 2016-03-10 NOTE — Care Management Important Message (Signed)
Important Message  Patient Details  Name: [name] MRN: [id number] Date of Birth: 06/13/1966   Medicare Important Message Given:  Yes    Caelynn Marshman P Alayziah Tangeman 03/10/2016, 10:00 AM

## 2016-03-10 NOTE — Progress Notes (Signed)
Physical Therapy Treatment Patient Details Name: [name] MRN: [id number] DOB: 06-Jul-1966 Today's Date: 03/10/2016    History of Present Illness Patient is a 50 y/o female s/p L TKA.  PMH includes fibromyalgia, bipolar, SS, HTN, obesity, anxiety, COPD, TSA 07/2015, GERD, PUD, COPD.  Developed SVT and moved to ICU overnight 5/23.    PT Comments    Pt progressing towards physical therapy goals. Max encouragement required for completion of therapeutic exercise as well as to improve ambulation distance. Pt anticipates d/c to SNF tomorrow. Pt may benefit from a platform walker at SNF as she has wrist pain which is limiting her during gait training with the RW.   Follow Up Recommendations  SNF;Supervision/Assistance - 24 hour     Equipment Recommendations  Rolling walker with 5" wheels    Recommendations for Other Services       Precautions / Restrictions Precautions Precautions: Fall;Knee Required Braces or Orthoses: Knee Immobilizer - Left (Did not use this session - no buckling noted. ) Restrictions Weight Bearing Restrictions: Yes LLE Weight Bearing: Weight bearing as tolerated    Mobility  Bed Mobility Overal bed mobility: Needs Assistance Bed Mobility: Supine to Sit;Sit to Supine     Supine to sit: Min guard;HOB elevated Sit to supine: Min guard;HOB elevated   General bed mobility comments: No physical assist required, however operative LE was guarded as pt scooted out fully to EOB and elevated LE's back into bed at end of session.   Transfers Overall transfer level: Needs assistance Equipment used: Rolling walker (2 wheeled) Transfers: Sit to/from Stand Sit to Stand: Min guard         General transfer comment: Close guard for safety as pt powered-up to full standing position. VC's for hand placement on seated surface for safety.   Ambulation/Gait Ambulation/Gait assistance: Min guard Ambulation Distance (Feet): 75 Feet Assistive device: Rolling walker  (2 wheeled) Gait Pattern/deviations: Step-through pattern;Decreased stride length;Trunk flexed Gait velocity: decreased Gait velocity interpretation: Below normal speed for age/gender General Gait Details: VC's for improved posture and sequencing/safety with the RW. Pt did not have knee immobilizer donned and no buckling of the L knee was noted throughout session. Pt took several (4-5) standing rest breaks due to wrist fatigue on the walker, and was cued not to lean elbows onto the walker for support.    Stairs            Wheelchair Mobility    Modified Rankin (Stroke Patients Only)       Balance Overall balance assessment: Needs assistance Sitting-balance support: Feet supported;No upper extremity supported Sitting balance-Leahy Scale: Fair     Standing balance support: During functional activity;No upper extremity supported Standing balance-Leahy Scale: Fair Standing balance comment: Pt was able to stand at EOB and pull up shorts without UE support.                     Cognition Arousal/Alertness: Lethargic Behavior During Therapy: WFL for tasks assessed/performed Overall Cognitive Status: Within Functional Limits for tasks assessed                      Exercises Total Joint Exercises Short Arc Quad: 10 reps Heel Slides: 10 reps Hip ABduction/ADduction: 10 reps Goniometric ROM: 68 knee flexion in supine.     General Comments        Pertinent Vitals/Pain Pain Assessment: Faces Pain Location: L knee Pain Descriptors / Indicators: Operative site guarding Pain Intervention(s): Monitored during session;Limited  activity within patient's tolerance;Repositioned    Home Living                      Prior Function            PT Goals (current goals can now be found in the care plan section) Acute Rehab PT Goals Patient Stated Goal: To go home  PT Goal Formulation: With patient Time For Goal Achievement: 03/11/16 Potential to Achieve  Goals: Good Progress towards PT goals: Progressing toward goals    Frequency  7X/week    PT Plan Current plan remains appropriate    Co-evaluation             End of Session Equipment Utilized During Treatment: Gait belt Activity Tolerance: Patient tolerated treatment well;Patient limited by lethargy Patient left: in bed;with call bell/phone within reach     Time: 1345-1422 PT Time Calculation (min) (ACUTE ONLY): 37 min  Charges:  $Gait Training: 8-22 mins $Therapeutic Exercise: 8-22 mins                    G Codes:      Rolinda Roan 2016-03-11, 2:37 PM   Rolinda Roan, PT, DPT Acute Rehabilitation Services Pager: [phone]

## 2016-03-11 NOTE — Clinical Social Work Note (Signed)
Patient has [employer] as a payer source and will need both OT and PT evaluations.  Patient educated on insurance needs for approval for her SNF stay.  Patient encouraged to work with PT as directed by ortho MD.  Nonnie Done, LCSW [phone]  [address] and [place] Licensed Clinical Social Worker

## 2016-03-11 NOTE — Progress Notes (Signed)
PT Cancellation Note  Patient Details Name: [name] MRN: [id number] DOB: 1966/03/08   Cancelled Treatment:    Reason Eval/Treat Not Completed: Max encouragement provided for participation with PT session. Pt reports she "did too much" yesterday, and is painful from being in the CPM early this morning. Pt was educated on benefits of OOB including pain management for stiff knee. Pt with difficulty staying awake during encounter and continued to decline. Will check back as schedule allows. Per pt the plan is for d/c to SNF today.    Rolinda Roan 03/11/2016, 10:35 AM   Rolinda Roan, PT, DPT Acute Rehabilitation Services Pager: [phone]

## 2016-03-11 NOTE — Progress Notes (Signed)
Orthopedic Tech Progress Note Patient Details:  [name] [birth year] [id number]  Patient ID: Manual Meier, female   DOB: 20-Nov-1965, 50 y.o.   MRN: [id number] Applied cpm 0-60  Karolee Stamps 03/11/2016, 5:51 AM

## 2016-03-11 NOTE — Progress Notes (Signed)
Patient ID: KRYS FUSSELMAN, female   DOB: 1966-02-20, 50 y.o.   MRN: [id number]     Subjective:  Patient reports pain as mild.  Patient states that she is ready to go to SNF and that she is doing good.  Denies any CP or SOB  Objective:   VITALS:   Filed Vitals:   03/11/16 0849 03/11/16 1300 03/11/16 1929 03/11/16 1948  BP:  156/75 116/69   Pulse:   80   Temp:  98.5 F (36.9 C) 98.7 F (37.1 C)   TempSrc:   Oral   Resp:  18 17   Height:      Weight:      SpO2: 98% 95% 99% 98%    ABD soft Sensation intact distally Dorsiflexion/Plantar flexion intact Incision: dressing C/D/I and no drainage   Lab Results  Component Value Date   WBC 5.6 03/07/2016   HGB 10.6* 03/07/2016   HCT 33.3* 03/07/2016   MCV 82.5 03/07/2016   PLT 187 03/07/2016   BMET    Component Value Date/Time   NA 138 03/08/2016 0534   K 3.3* 03/08/2016 0534   CL 103 03/08/2016 0534   CO2 28 03/08/2016 0534   GLUCOSE 115* 03/08/2016 0534   BUN 8 03/08/2016 0534   CREATININE 0.61 03/08/2016 0534   CALCIUM 8.3* 03/08/2016 0534   GFRNONAA >60 03/08/2016 0534   GFRAA >60 03/08/2016 0534     Assessment/Plan: 7 Days Post-Op   Principal Problem:   Primary localized osteoarthritis of left knee Active Problems:   S/P total knee arthroplasty   SVT (supraventricular tachycardia) (HCC)   Atelectasis   Absolute anemia   Arterial hypotension   Pulmonary emphysema (HCC)   Sinus tachycardia (HCC)   GERD (gastroesophageal reflux disease)   Postoperative anemia due to acute blood loss   possible malignant hyperthermia variant post op   Advance diet Up with therapy Plan for discharge tomorrow Discharge to SNF WBAT Dry dressing PRN    Remonia Richter 03/11/2016, 9:24 PM  Discussed and agree with above.    Marchia Bond, MD Cell [phone]

## 2016-03-12 NOTE — Progress Notes (Signed)
Physical Therapy Treatment Patient Details Name: [name] MRN: [id number] DOB: 06/07/1966 Today's Date: 03/12/2016    History of Present Illness Patient is a 50 y/o female s/p L TKA.  PMH includes fibromyalgia, bipolar, SS, HTN, obesity, anxiety, COPD, TSA 07/2015, GERD, PUD, COPD.  Developed SVT and moved to ICU overnight 5/23.    PT Comments    Pt progressing slowly towards physical therapy goals. Motivation/encouragement is required for pt to participate in PT sessions. Importance of participation in therapy at SNF was reinforced. Pt anticipates d/c to SNF rehab today.   Follow Up Recommendations  SNF;Supervision/Assistance - 24 hour     Equipment Recommendations  Rolling walker with 5" wheels    Recommendations for Other Services       Precautions / Restrictions Precautions Precautions: Fall;Knee Required Braces or Orthoses: Knee Immobilizer - Left (Did not use this session - no buckling noted. ) Restrictions Weight Bearing Restrictions: Yes LLE Weight Bearing: Weight bearing as tolerated    Mobility  Bed Mobility Overal bed mobility: Needs Assistance Bed Mobility: Supine to Sit     Supine to sit: HOB elevated;Supervision     General bed mobility comments: Supervision for safety.   Transfers Overall transfer level: Needs assistance Equipment used: Rolling walker (2 wheeled) Transfers: Sit to/from Stand Sit to Stand: Supervision         General transfer comment: Close supervision for safety as pt powered-up to full standing position. VC's for hand placement on seated surface for safety.   Ambulation/Gait Ambulation/Gait assistance: Min guard Ambulation Distance (Feet): 75 Feet Assistive device: Rolling walker (2 wheeled) Gait Pattern/deviations: Step-through pattern;Decreased stride length;Trunk flexed Gait velocity: decreased Gait velocity interpretation: Below normal speed for age/gender General Gait Details: VC's for improved posture and  sequencing/safety with the RW.    Stairs            Wheelchair Mobility    Modified Rankin (Stroke Patients Only)       Balance Overall balance assessment: Needs assistance Sitting-balance support: Feet supported;No upper extremity supported Sitting balance-Leahy Scale: Fair     Standing balance support: During functional activity;No upper extremity supported Standing balance-Leahy Scale: Fair                      Cognition Arousal/Alertness: Lethargic Behavior During Therapy: WFL for tasks assessed/performed Overall Cognitive Status: Within Functional Limits for tasks assessed                      Exercises Total Joint Exercises Heel Slides: 10 reps Hip ABduction/ADduction: 10 reps Long Arc Quad: 10 reps Knee Flexion: 5 reps (stretching reps) Goniometric ROM: 70    General Comments        Pertinent Vitals/Pain Pain Assessment: Faces Faces Pain Scale: Hurts a little bit Pain Location: L knee Pain Descriptors / Indicators: Operative site guarding;Sore Pain Intervention(s): Limited activity within patient's tolerance;Monitored during session;Repositioned    Home Living                      Prior Function            PT Goals (current goals can now be found in the care plan section) Acute Rehab PT Goals Patient Stated Goal: To go home  PT Goal Formulation: With patient Time For Goal Achievement: 03/11/16 Potential to Achieve Goals: Good Progress towards PT goals: Progressing toward goals    Frequency  7X/week    PT Plan Current  plan remains appropriate    Co-evaluation             End of Session Equipment Utilized During Treatment: Gait belt Activity Tolerance: Patient tolerated treatment well;Patient limited by lethargy Patient left: in bed;with call bell/phone within reach     Time: 1414-1445 PT Time Calculation (min) (ACUTE ONLY): 31 min  Charges:  $Gait Training: 8-22 mins $Therapeutic Exercise: 8-22  mins                    G Codes:      Rolinda Roan 2016-03-14, 3:05 PM   Rolinda Roan, PT, DPT Acute Rehabilitation Services Pager: [phone]

## 2016-03-12 NOTE — Progress Notes (Signed)
Patient ID: [name], female   DOB: [birth year], 50 y.o.   MRN: [id number]     Subjective:  Patient reports pain as mild.  Patient denies any CP or SOB  Objective:   VITALS:   Filed Vitals:   03/11/16 1300 03/11/16 1929 03/11/16 1948 03/12/16 0605  BP: 156/75 116/69  121/66  Pulse:  80  78  Temp: 98.5 F (36.9 C) 98.7 F (37.1 C)  98.9 F (37.2 C)  TempSrc:  Oral  Oral  Resp: 18 17  18   Height:      Weight:      SpO2: 95% 99% 98% 96%    ABD soft Sensation intact distally Dorsiflexion/Plantar flexion intact Incision: dressing C/D/I and no drainage   Lab Results  Component Value Date   WBC 5.6 03/07/2016   HGB 10.6* 03/07/2016   HCT 33.3* 03/07/2016   MCV 82.5 03/07/2016   PLT 187 03/07/2016   BMET    Component Value Date/Time   NA 138 03/08/2016 0534   K 3.3* 03/08/2016 0534   CL 103 03/08/2016 0534   CO2 28 03/08/2016 0534   GLUCOSE 115* 03/08/2016 0534   BUN 8 03/08/2016 0534   CREATININE 0.61 03/08/2016 0534   CALCIUM 8.3* 03/08/2016 0534   GFRNONAA >60 03/08/2016 0534   GFRAA >60 03/08/2016 0534     Assessment/Plan: 8 Days Post-Op   Principal Problem:   Primary localized osteoarthritis of left knee Active Problems:   S/P total knee arthroplasty   SVT (supraventricular tachycardia) (HCC)   Atelectasis   Absolute anemia   Arterial hypotension   Pulmonary emphysema (HCC)   Sinus tachycardia (HCC)   GERD (gastroesophageal reflux disease)   Postoperative anemia due to acute blood loss   possible malignant hyperthermia variant post op   Advance diet Up with therapy Discharge to SNF WBAT Dry dressing prn  Follow up with Dr Mardelle Matte in 1 week   [name] 03/12/2016, 8:01 AM  Discussed and agree with above.    Marchia Bond, MD Cell [phone]

## 2016-03-12 NOTE — Progress Notes (Signed)
Orthopedic Tech Progress Note Patient Details:  [name] [birth year] [id number]  CPM Left Knee CPM Left Knee: On Left Knee Flexion (Degrees): 60 Left Knee Extension (Degrees): 0   [name] 03/12/2016, 1:55 PM

## 2016-03-12 NOTE — Care Management Important Message (Signed)
Important Message  Patient Details  Name: [name] MRN: [id number] Date of Birth: [birth year]   Medicare Important Message Given:  Yes    Loann Quill 03/12/2016, 9:26 AM

## 2016-03-12 NOTE — Progress Notes (Signed)
Orthopedic Tech Progress Note Patient Details:  [name] Jan 14, 1966 [id number]  Patient ID: [name], female   DOB: 1966-03-07, 50 y.o.   MRN: [id number] Applied cpm 0-60  Karolee Stamps 03/12/2016, 6:31 AM

## 2016-03-13 ENCOUNTER — Non-Acute Institutional Stay (SKILLED_NURSING_FACILITY): Payer: Medicare HMO | Admitting: Internal Medicine

## 2016-03-13 ENCOUNTER — Encounter: Payer: Self-pay | Admitting: Internal Medicine

## 2016-03-13 ENCOUNTER — Other Ambulatory Visit: Payer: Self-pay

## 2016-03-13 DIAGNOSIS — M1712 Unilateral primary osteoarthritis, left knee: Secondary | ICD-10-CM

## 2016-03-13 DIAGNOSIS — R2681 Unsteadiness on feet: Secondary | ICD-10-CM | POA: Diagnosis not present

## 2016-03-13 DIAGNOSIS — F3170 Bipolar disorder, currently in remission, most recent episode unspecified: Secondary | ICD-10-CM | POA: Diagnosis not present

## 2016-03-13 DIAGNOSIS — M48061 Spinal stenosis, lumbar region without neurogenic claudication: Secondary | ICD-10-CM

## 2016-03-13 DIAGNOSIS — M4806 Spinal stenosis, lumbar region: Secondary | ICD-10-CM | POA: Diagnosis not present

## 2016-03-13 DIAGNOSIS — J439 Emphysema, unspecified: Secondary | ICD-10-CM | POA: Diagnosis not present

## 2016-03-13 DIAGNOSIS — F32A Depression, unspecified: Secondary | ICD-10-CM

## 2016-03-13 DIAGNOSIS — D62 Acute posthemorrhagic anemia: Secondary | ICD-10-CM | POA: Diagnosis not present

## 2016-03-13 DIAGNOSIS — F411 Generalized anxiety disorder: Secondary | ICD-10-CM

## 2016-03-13 DIAGNOSIS — D72829 Elevated white blood cell count, unspecified: Secondary | ICD-10-CM | POA: Diagnosis not present

## 2016-03-13 DIAGNOSIS — K219 Gastro-esophageal reflux disease without esophagitis: Secondary | ICD-10-CM | POA: Diagnosis not present

## 2016-03-13 DIAGNOSIS — I10 Essential (primary) hypertension: Secondary | ICD-10-CM

## 2016-03-13 DIAGNOSIS — F329 Major depressive disorder, single episode, unspecified: Secondary | ICD-10-CM

## 2016-03-13 DIAGNOSIS — K5900 Constipation, unspecified: Secondary | ICD-10-CM

## 2016-03-13 MED ORDER — HYDROMORPHONE HCL 2 MG PO TABS: 2.0000 mg | ORAL_TABLET | ORAL | Status: DC | PRN

## 2016-03-13 MED ORDER — OXYCODONE HCL 15 MG PO TABS: 15.0000 mg | ORAL_TABLET | ORAL | Status: DC | PRN

## 2016-03-13 MED ORDER — ALPRAZOLAM 1 MG PO TABS: 1.0000 mg | ORAL_TABLET | Freq: Three times a day (TID) | ORAL | Status: DC

## 2016-03-13 NOTE — Progress Notes (Signed)
LOCATION: [name]  PCP: Vickii Penna., MD   Code Status: Full Code  Goals of care: Advanced Directive information Advanced Directives 03/06/2016  Does patient have an advance directive? No  Would patient like information on creating an advanced directive? No - patient declined information       Extended Emergency Contact Information Primary Emergency Contact: Gypsy Balsam, [place] [employer] Phone: [phone] Relation: Daughter Secondary Emergency Contact: Gladis Riffle, [place] [employer] Phone: [phone] Relation: Daughter   Allergies  Allergen Reactions  . Abilify [Aripiprazole] Other (See Comments)    Causes body shaking and tremors  . Latuda [Lurasidone Hcl] Other (See Comments) and Nausea And Vomiting    Causes body shaking and tremors EPS. She is intolerant to all AAP in this way Causes body shaking and tremors  . Naproxen Anaphylaxis  . Wellbutrin [Bupropion] Other (See Comments)    Shakes and jerking EPS     Chief Complaint  Patient presents with  . New Admit To SNF    New Admission     HPI:  Patient is a 50 y.o. female seen today for short term rehabilitation post hospital admission from 03/04/16-03/12/16 with left knee OA. She underwent left total knee arthroplasty. Post surgery, she had SVT and required adenosine. She was seen by cardiology. She also had fever and was started on antibiotics which were discontinue with culture result being negative. She had acute blood loss anemia and required 4 u prbc transfusion. PE was ruled out this hospital admission. She is seen in her room today with her daughter and grandkids present. Her pain is under control with current pain regimen. She denies any concern.  Review of Systems:  Constitutional: Negative for fever, chills, diaphoresis.  HENT: Negative for headache, congestion, nasal discharge, hearing loss, sore  throat, difficulty swallowing.   Eyes: Negative for blurred vision, double vision and discharge.  Respiratory: Negative for cough, shortness of breath and wheezing.   Cardiovascular: Negative for chest pain, palpitations, leg swelling.  Gastrointestinal: Negative for heartburn, nausea, vomiting, abdominal pain, loss of appetite, melena, diarrhea and constipation. Last bowel movement was today.  Genitourinary: Negative for dysuria and flank pain.  Musculoskeletal: Negative for back pain, fall in the facility.  Skin: Negative for itching, rash.  Neurological: Negative for dizziness. Psychiatric/Behavioral: Negative for depression   Past Medical History  Diagnosis Date  . Fibromyalgia   . Bipolar 1 disorder ([place])   . Spinal stenosis   . Hypertension   . Obesity   . GERD (gastroesophageal reflux disease)   . PUD (peptic ulcer disease)   . Anxiety   . COPD (chronic obstructive pulmonary disease) ([place])   . Pneumonia     developed in 07/2015- post shoulder replacement   . Depression   . Osteoarthritis     knees, elbows   . 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 reports that she is cured   . Primary localized osteoarthritis of left knee 03/04/2016  . possible malignant hyperthermia variant post op 03/07/2016   Past Surgical History  Procedure Laterality Date  . Abdominal hysterectomy  1998  . Knee arthroscopy Right 2005  . Cardiovascular stress test  03/2010    Lexiscan Myoview: EF 55%, mild breast attenuation, no ischemia or scar  . Transthoracic echocardiogram  03/2010    EF 50-55%, normal LV size, no WMAs  . Tonsillectomy    . Total shoulder arthroplasty Left 07/17/2015    Procedure: TOTAL SHOULDER ARTHROPLASTY;  Surgeon: Marchia Bond, MD;  Location: [place];  Service: Orthopedics;  Laterality: Left;  . Tubal ligation  1990  . Total knee arthroplasty Left 03/04/2016    Procedure: TOTAL KNEE ARTHROPLASTY;  Surgeon:  Marchia Bond, MD;  Location: [facility];  Service: Orthopedics;  Laterality: Left;  . Joint replacement    . Dilation and curettage of uterus  1986    "lost a son"   Social History:   reports that she has been smoking Cigarettes.  She has a 18 pack-year smoking history. She has never used smokeless tobacco. She reports that she drinks about 0.6 oz of alcohol per week. She reports that she does not use illicit drugs.  Family History  Problem Relation Age of Onset  . Diabetes Other   . Hypertension Other   . Schizophrenia Mother   . Hypertension Mother   . Stroke Father     Medications:   Medication List       This list is accurate as of: 03/13/16 12:03 PM.  Always use your most recent med list.               ALPRAZolam 1 MG tablet  Commonly known as:  XANAX  Take 1 tablet (1 mg total) by mouth 3 (three) times daily.     amitriptyline 50 MG tablet  Commonly known as:  ELAVIL  TAKE 1 TABLET BY MOUTH EVERY NIGHT AT BEDTIME     aspirin 81 MG chewable tablet  Chew 81 mg by mouth at bedtime.     baclofen 10 MG tablet  Commonly known as:  LIORESAL  Take 1 tablet (10 mg total) by mouth 3 (three) times daily as needed for muscle spasms.     divalproex 500 MG 24 hr tablet  Commonly known as:  DEPAKOTE ER  Take 1,000 mg by mouth every evening.     DULoxetine 60 MG capsule  Commonly known as:  CYMBALTA  Take 60 mg by mouth daily.     hydrochlorothiazide 12.5 MG tablet  Commonly known as:  HYDRODIURIL  Take 12.5 mg by mouth daily.     HYDROmorphone 2 MG tablet  Commonly known as:  DILAUDID  Take 1 tablet (2 mg total) by mouth every 4 (four) hours as needed for severe pain.     lidocaine 5 %  Commonly known as:  LIDODERM  Place 1 patch onto the skin every 12 (twelve) hours. Remove & Discard patch within 12 hours or as directed     nitroGLYCERIN 0.4 MG SL tablet  Commonly known as:  NITROSTAT  Place 0.4 mg under the tongue every 5 (five) minutes as needed for chest pain.      oxyCODONE 15 MG immediate release tablet  Commonly known as:  ROXICODONE  Take 1 tablet (15 mg total) by mouth every 4 (four) hours as needed for pain.     polyethylene glycol packet  Commonly known as:  MIRALAX / GLYCOLAX  Take 17 g by mouth daily as needed for mild constipation.     PROAIR HFA 108 (90 Base) MCG/ACT inhaler  Generic drug:  albuterol  Inhale 2 puffs into the lungs every 6 (six) hours as needed for wheezing or shortness of breath.     ranitidine 150 MG tablet  Commonly known as:  ZANTAC  Take 150 mg by mouth 2 (two) times daily as needed for heartburn.     rivaroxaban 10 MG Tabs tablet  Commonly known as:  XARELTO  Take 1 tablet (10 mg total) by mouth daily.     sennosides-docusate sodium 8.6-50 MG tablet  Commonly known as:  SENOKOT-S  Take 2 tablets by mouth daily.        Immunizations: Immunization History  Administered Date(s) Administered  . Influenza,inj,Quad PF,36+ Mos 07/18/2015  . Pneumococcal Polysaccharide-23 07/18/2015  . Td 10/05/2011     Physical Exam: Filed Vitals:   03/13/16 1155  BP: 109/75  Pulse: 88  Temp: 97.7 F (36.5 C)  TempSrc: Oral  Resp: 18  Height: 5\' 2"  (1.575 m)  Weight: 290 lb 12.8 oz (131.906 kg)   Body mass index is 53.17 kg/(m^2).  General- elderly female, morbidly obese, in no acute distress Head- normocephalic, atraumatic Nose- no maxillary or frontal sinus tenderness, no nasal discharge Throat- moist mucus membrane Eyes- PERRLA, EOMI, no pallor, no icterus, no discharge, normal conjunctiva, normal sclera Neck- no cervical lymphadenopathy Cardiovascular- normal s1,s2, no murmur, trace leg edema Respiratory- bilateral clear to auscultation, no wheeze, no rhonchi, no crackles, no use of accessory muscles Abdomen- bowel sounds present, soft, non tender Musculoskeletal- able to move all 4 extremities, limited left knee range of motion Neurological- alert and oriented to person, place and time Skin- warm and  dry, left knee surgical incision with mepilex dressing Psychiatry- normal mood and affect    Labs reviewed: Basic Metabolic Panel:  Recent Labs  07/20/15 0930  03/05/16 2200 03/06/16 0344 03/07/16 0538 03/08/16 0534  NA  --   < >  --  138 139 138  K  --   < >  --  4.0 3.5 3.3*  CL  --   < >  --  101 102 103  CO2  --   < >  --  30 32 28  GLUCOSE  --   < >  --  133* 81 115*  BUN  --   < >  --  7 11 8   CREATININE  --   < >  --  0.60 0.58 0.61  CALCIUM  --   < >  --  8.7* 8.1* 8.3*  MG 1.9  --  1.8 1.9  --   --   PHOS  --   --   --  2.0* 2.7  --   < > = values in this interval not displayed. Liver Function Tests:  Recent Labs  07/20/15 0240 03/05/16 0246  AST 56* 30  ALT 26 13*  ALKPHOS 37* 37*  BILITOT 0.4 0.3  PROT 5.7* 5.8*  ALBUMIN 2.6* 2.6*    Recent Labs  03/05/16 0723  LIPASE 98*  AMYLASE 61   No results for input(s): AMMONIA in the last 8760 hours. CBC:  Recent Labs  07/20/15 0240  03/06/16 0344 03/06/16 1540 03/07/16 0538 03/07/16 2148  WBC 6.1  < > 9.0 6.4 5.6  --   NEUTROABS 4.0  --   --   --   --   --   HGB 9.6*  < > 7.1* 6.4* 8.3* 10.6*  HCT 29.5*  < > 22.8* 20.8* 26.0* 33.3*  MCV 85.0  < > 83.8 83.9 82.5  --   PLT 206  < > 190 185 187  --   < > = values in this interval not displayed. Cardiac Enzymes:  Recent Labs  10/06/15 0505 03/05/16  0246 03/05/16 0723 03/05/16 1139  CKTOTAL 119  --   --  102  TROPONINI  --  <0.03 0.03  --    BNP: Invalid input(s): POCBNP CBG:  Recent Labs  03/05/16 0737  GLUCAP 138*    Radiological Exams: Dg Chest 2 View  02/20/2016  CLINICAL DATA:  Preop testing for knee arthroplasty EXAM: CHEST  2 VIEW COMPARISON:  07/18/2015 FINDINGS: Normal heart size and mediastinal contours. No infiltrate or edema. No effusion or pneumothorax. Left glenohumeral arthroplasty with stable alignment since prior. IMPRESSION: No active cardiopulmonary disease. Electronically Signed   By: Monte Fantasia M.D.   On:  02/20/2016 14:19   Ct Angio Chest Pe W/cm &/or Wo Cm  03/05/2016  CLINICAL DATA:  Tachycardia. EXAM: CT ANGIOGRAPHY CHEST WITH CONTRAST TECHNIQUE: Multidetector CT imaging of the chest was performed using the standard protocol during bolus administration of intravenous contrast. Multiplanar CT image reconstructions and MIPs were obtained to evaluate the vascular anatomy. CONTRAST:  100 cc Isovue 370 IV COMPARISON:  Radiograph earlier this day.  Chest CT 12/26/2013 FINDINGS: Suboptimal contrast timing bolus, no pulmonary embolus to the level of the segmental branches. Subsegmental branches cannot be assessed. The thoracic aorta is normal in caliber. No aortic dissection. No mediastinal or hilar adenopathy. No pericardial effusion. Linear atelectasis within both lower lobes and dependent right upper lobe. No consolidation to suggest pneumonia. No pulmonary edema. No pleural effusion. Evaluation of the upper abdomen demonstrates hepatic steatosis. There is questionable peripancreatic edema versus motion. There are no acute or suspicious osseous abnormalities. Review of the MIP images confirms the above findings. IMPRESSION: 1. No central pulmonary embolus.  Cannot assess subsegmental levels. 2. Scattered atelectasis in both lungs, no pneumonia. 3. Question of peripancreatic soft tissue stranding in the upper abdomen. Can be seen with acute pancreatitis. Motion artifact is felt less likely. Recommend correlation with pancreatic enzymes. Electronically Signed   By: Jeb Levering M.D.   On: 03/05/2016 05:10   Dg Chest Port 1 View  03/06/2016  CLINICAL DATA:  Pulmonary edema. EXAM: PORTABLE CHEST 1 VIEW COMPARISON:  CT 03/05/2016.  Chest x-ray 03/05/2016. FINDINGS: Mediastinum and hilar structures normal. Bibasilar subsegmental atelectasis again noted. No pleural effusion or pneumothorax. Heart size stable. Left shoulder replacement. IMPRESSION: Persistent bibasilar subsegmental atelectasis. Electronically Signed    By: [place]   On: 03/06/2016 06:45   Dg Chest Port 1 View  03/05/2016  CLINICAL DATA:  Shortness of breath. EXAM: PORTABLE CHEST 1 VIEW COMPARISON:  Front and lateral views 02/20/2016 FINDINGS: Cardiomediastinal contours are unchanged allowing for differences in technique and positioning. No pulmonary edema, confluent consolidation, pleural effusion or pneumothorax. Linear atelectasis or scarring in the left midlung zone. Left humeral head prosthesis, partially included. IMPRESSION: No active disease. Electronically Signed   By: Jeb Levering M.D.   On: 03/05/2016 02:48   Dg Knee Left Port  03/04/2016  CLINICAL DATA:  Status post total knee replacement EXAM: PORTABLE LEFT KNEE - 1-2 VIEW COMPARISON:  10/06/2015 FINDINGS: Tricompartmental knee replacement with components in anticipated position. There is anticipated postoperative soft tissue change in the joint and anteriorly. IMPRESSION: Anticipated postoperative appearance Electronically Signed   By: Skipper Cliche M.D.   On: 03/04/2016 11:28    Assessment/Plan  Unsteady gait Post left knee surgery. Will have patient work with PT/OT as tolerated to regain strength and restore function.  Fall precautions are in place.  Left knee OA A/p left total knee arthroplasty. Has orthopedics follow up. Continue oxycodone  IR 15 mg q4h prn pain and baclofen 10 mg tid prn muscle spasm. Will have her work with physical therapy and occupational therapy team to help with gait training and muscle strengthening exercises.fall precautions. Skin care. Encourage to be out of bed. Continue xarelto for dvt prophylaxis.   Leukocytosis Likely reactive, monitor cbc and temp curve  Blood loss anemia Post op, s/p prbc transfusion  Check cbc  HTN Monitor bp daily. Continue hctz 12.5 mg daily, check bmp  Chronic depression Continue duloxetine and amitriptyline   Constipation Stable, contnue senokot s 2 tab daily and miralax daily as needed, hydration  encouraged  Lumbar stenosis Continue lidocaine 5% patch and prn oxyIR, followed by pain management clinic outpatient. Reviewed their OV noted, she was on oxyIR 15 mg tid with lidocaine patch as outpatient. Currently on oxyir 15 MG Q4H PRN. D/c dilaudid.  Bipolar disorder Stable mood, continue divalproex  GAD Stable, continue xanax 1 mg tid  GERD Stable, continue prn ranitidine for now  Emphysema Stable, continue proair     Goals of care: short term rehabilitation   Labs/tests ordered: CBC, CMP 03/14/16  Family/ staff Communication: reviewed care plan with patient and nursing supervisor    Blanchie Serve, MD Internal Medicine [place], [place] [address] Cell Phone (Monday-Friday 8 am - 5 pm): [phone] On Call: [phone] and follow prompts after 5 pm and on weekends Office Phone: [phone] Office Fax: [phone]

## 2016-03-13 NOTE — Addendum Note (Signed)
Addended by: Logan Bores on: 03/13/2016 09:58 AM   Modules accepted: Orders

## 2016-03-13 NOTE — Telephone Encounter (Signed)
Rx faxed to [facility] @ [phone], phone number [phone]  

## 2016-03-16 IMAGING — CR DG CHEST 2V
2 series · 2 of 2 positions shown · non-contrast
Comparison: 12/24/2013

CLINICAL DATA: Fever and tachycardia. Recent left shoulder
arthroplasty.

EXAM:
CHEST  2 VIEW

[chest pa]
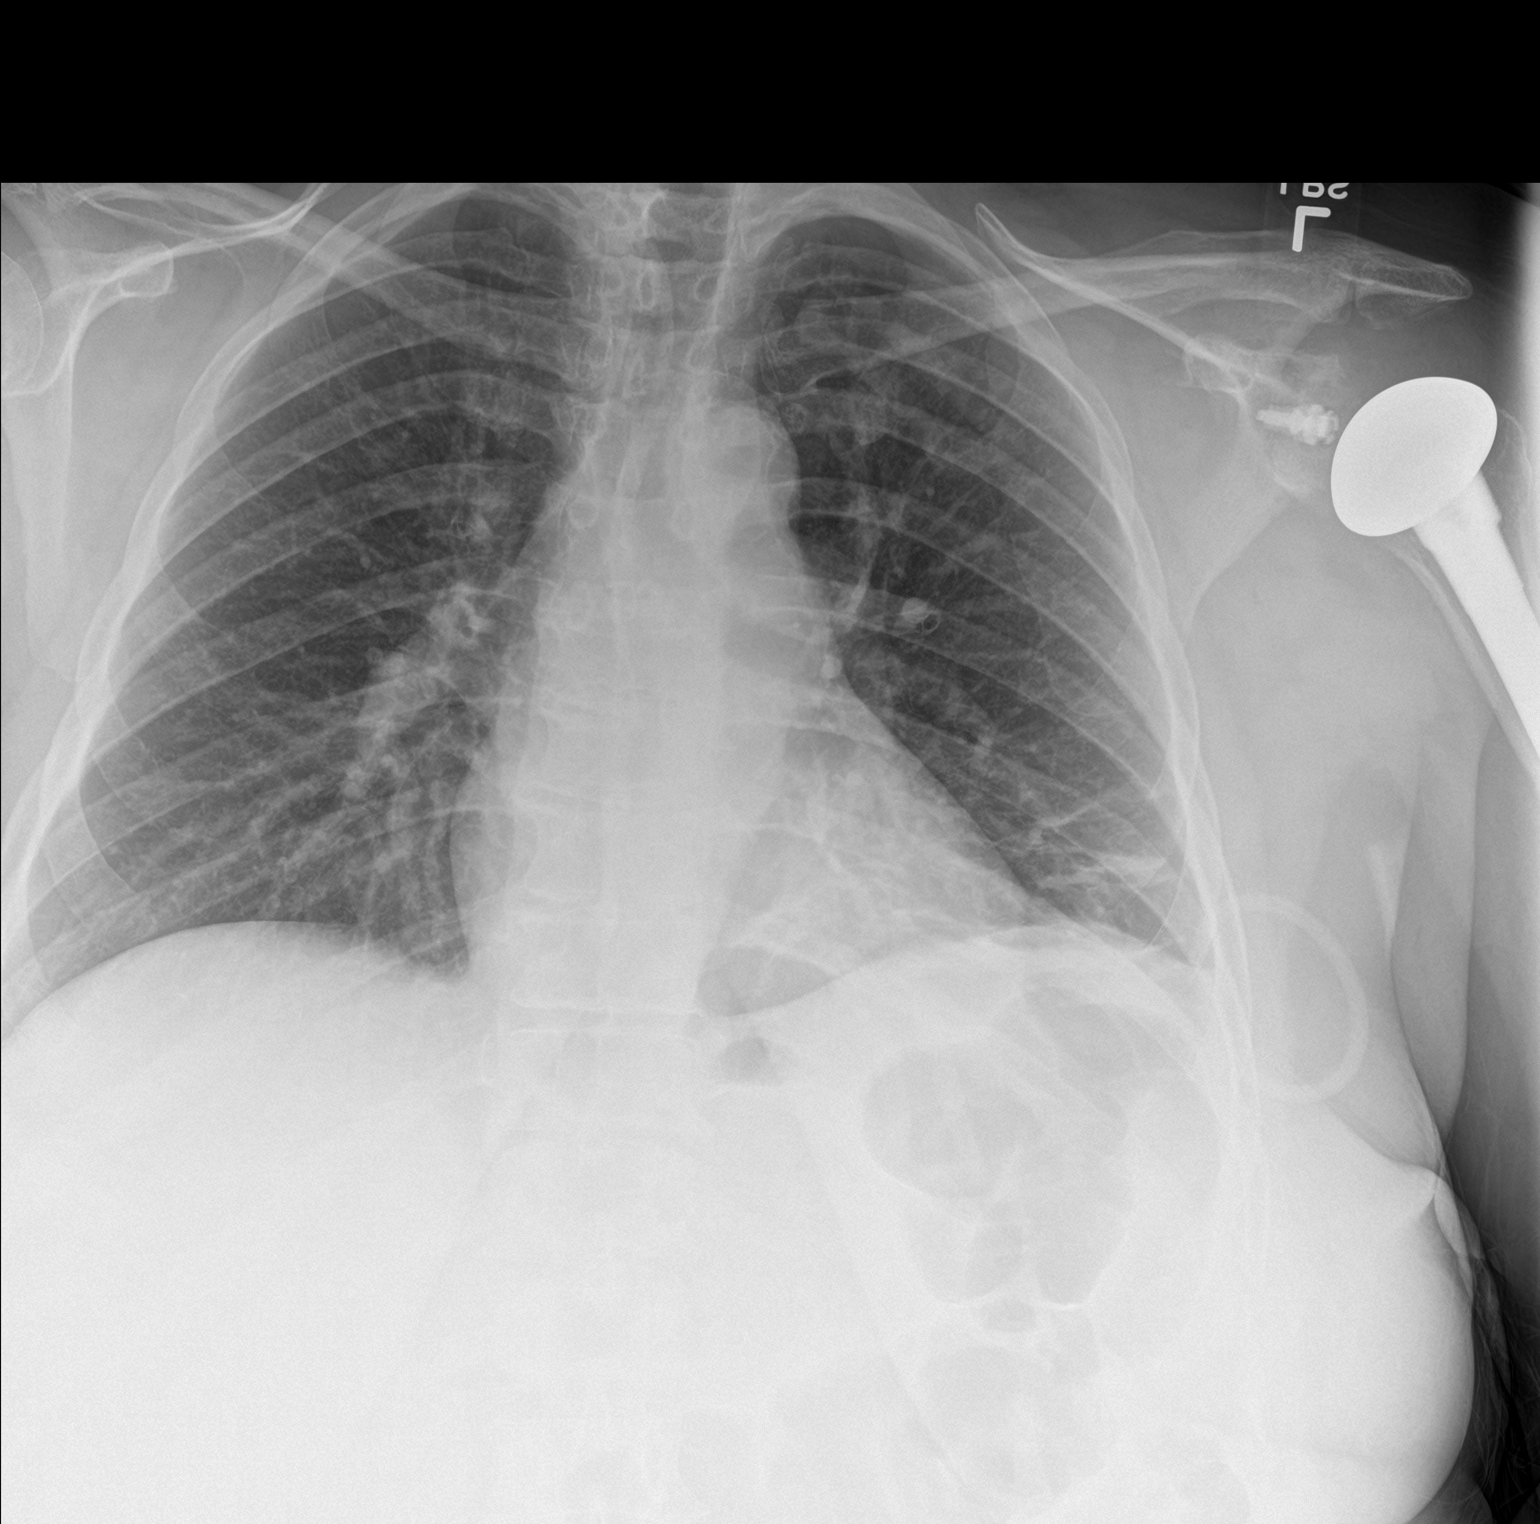

[chest lat]
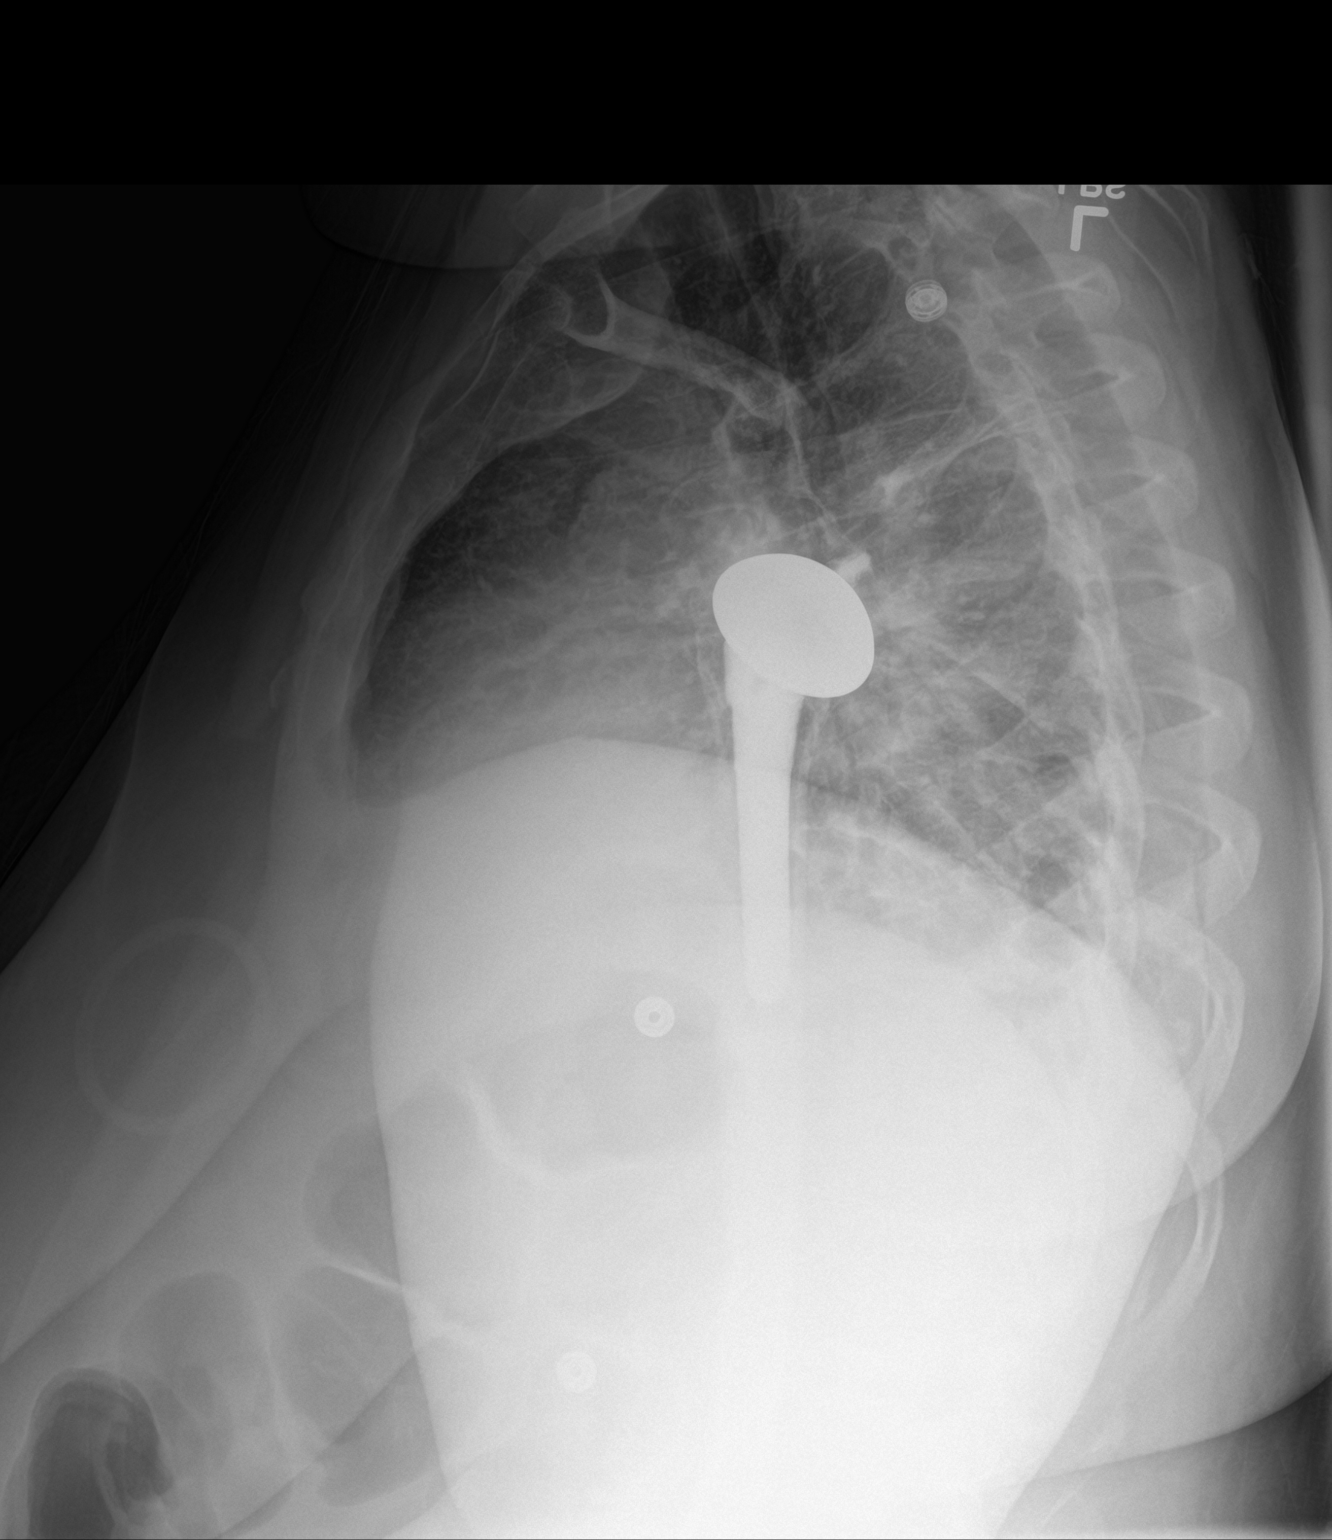

[2 of 2 positions shown; findings below may reference images not displayed]

FINDINGS: There is mild linear atelectasis or infiltrate in the left base,
without confluent airspace consolidation. The right lung is clear.
There is no pleural effusion. Pulmonary vasculature is normal. Hilar
and mediastinal contours are unremarkable.
IMPRESSION: Linear basilar atelectasis or infiltrate in the left lung.

## 2016-03-18 ENCOUNTER — Other Ambulatory Visit: Payer: Self-pay | Admitting: Physical Medicine & Rehabilitation

## 2016-03-18 ENCOUNTER — Other Ambulatory Visit: Payer: Self-pay | Admitting: Registered Nurse

## 2016-04-17 ENCOUNTER — Encounter: Payer: Medicare HMO | Attending: Physical Medicine and Rehabilitation | Admitting: Registered Nurse

## 2016-04-17 ENCOUNTER — Encounter: Payer: Self-pay | Admitting: Registered Nurse

## 2016-04-17 VITALS — BP 122/86 | HR 103 | Resp 14

## 2016-04-17 DIAGNOSIS — M47817 Spondylosis without myelopathy or radiculopathy, lumbosacral region: Secondary | ICD-10-CM | POA: Insufficient documentation

## 2016-04-17 DIAGNOSIS — G894 Chronic pain syndrome: Secondary | ICD-10-CM | POA: Diagnosis not present

## 2016-04-17 DIAGNOSIS — M25561 Pain in right knee: Secondary | ICD-10-CM

## 2016-04-17 DIAGNOSIS — M19019 Primary osteoarthritis, unspecified shoulder: Secondary | ICD-10-CM | POA: Diagnosis present

## 2016-04-17 DIAGNOSIS — M791 Myalgia: Secondary | ICD-10-CM | POA: Insufficient documentation

## 2016-04-17 DIAGNOSIS — M12512 Traumatic arthropathy, left shoulder: Secondary | ICD-10-CM | POA: Diagnosis present

## 2016-04-17 DIAGNOSIS — Z79899 Other long term (current) drug therapy: Secondary | ICD-10-CM | POA: Insufficient documentation

## 2016-04-17 DIAGNOSIS — M129 Arthropathy, unspecified: Secondary | ICD-10-CM | POA: Insufficient documentation

## 2016-04-17 DIAGNOSIS — M62838 Other muscle spasm: Secondary | ICD-10-CM

## 2016-04-17 DIAGNOSIS — Z5181 Encounter for therapeutic drug level monitoring: Secondary | ICD-10-CM | POA: Diagnosis present

## 2016-04-17 DIAGNOSIS — M609 Myositis, unspecified: Secondary | ICD-10-CM | POA: Insufficient documentation

## 2016-04-17 DIAGNOSIS — M19012 Primary osteoarthritis, left shoulder: Secondary | ICD-10-CM | POA: Insufficient documentation

## 2016-04-17 MED ORDER — OXYCODONE HCL 15 MG PO TABS: 15.0000 mg | ORAL_TABLET | Freq: Three times a day (TID) | ORAL | Status: DC | PRN

## 2016-04-17 NOTE — Progress Notes (Signed)
Subjective:    Patient ID: [name], female    DOB: 1965/12/06, 50 y.o.   MRN: [id number]  HPI: [name] is a 50 year old female who returns for follow up for chronic pain and medication refill. She states her pain is located in her lower back.She rates her pain 8. Her current exercise regime is walking and she will be statring water aerobics next week. [name] s/p for Total Left Knee Arthroplasty on 03/04/16 by Dr. Mardelle Matte. [name] was prescribed Percocet 10/325 by Dr. Daine Gip 03/17/2016, she has been released under his care. We will resume her analgesics she verbalizes understanding.  Pain Inventory Average Pain 5 Pain Right Now 8 My pain is burning, stabbing and tingling  In the last 24 hours, has pain interfered with the following? General activity 3 Relation with others 7 Enjoyment of life 6 What TIME of day is your pain at its worst? evening Sleep (in general) Fair  Pain is worse with: walking, bending and standing Pain improves with: rest and medication Relief from Meds: 6  Mobility walk without assistance how many minutes can you walk? 15 ability to climb steps?  yes do you drive?  yes Do you have any goals in this area?  yes  Function retired I need assistance with the following:  household duties Do you have any goals in this area?  no  Neuro/Psych weakness numbness trouble walking  Prior Studies Any changes since last visit?  no  Physicians involved in your care Any changes since last visit?  no   Family History  Problem Relation Age of Onset  . Diabetes Other   . Hypertension Other   . Schizophrenia Mother   . Hypertension Mother   . Stroke Father    Social History   Social History  . Marital Status: Divorced    Spouse Name: N/A  . Number of Children: N/A  . Years of Education: N/A   Social History Main Topics  . Smoking status: Current Every Day Smoker -- 0.50 packs/day for 36 years    Types: Cigarettes  .  Smokeless tobacco: Never Used  . Alcohol Use: 0.6 oz/week    1 Shots of liquor per week  . Drug Use: No     Comment: goes to pain clinic  . Sexual Activity: Not Asked   Other Topics Concern  . None   Social History Narrative   Past Surgical History  Procedure Laterality Date  . Abdominal hysterectomy  1998  . Knee arthroscopy Right 2005  . Cardiovascular stress test  03/2010    Lexiscan Myoview: EF 55%, mild breast attenuation, no ischemia or scar  . Transthoracic echocardiogram  03/2010    EF 50-55%, normal LV size, no WMAs  . Tonsillectomy    . Total shoulder arthroplasty Left 07/17/2015    Procedure: TOTAL SHOULDER ARTHROPLASTY;  Surgeon: Marchia Bond, MD;  Location: [facility];  Service: Orthopedics;  Laterality: Left;  . Tubal ligation  1990  . Total knee arthroplasty Left 03/04/2016    Procedure: TOTAL KNEE ARTHROPLASTY;  Surgeon: Marchia Bond, MD;  Location: [facility];  Service: Orthopedics;  Laterality: Left;  . Joint replacement    . Dilation and curettage of uterus  1986    "lost a son"   Past Medical History  Diagnosis Date  . Fibromyalgia   . Bipolar 1 disorder ([place])   . Spinal stenosis   . Hypertension   . Obesity   . GERD (gastroesophageal  reflux disease)   . PUD (peptic ulcer disease)   . Anxiety   . COPD (chronic obstructive pulmonary disease) ([place])   . Pneumonia     developed in 07/2015- post shoulder replacement   . Depression   . Osteoarthritis     knees, elbows   . 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 Cancer ([place])     uterine CA/- resulted in hysterectomy, pt. reports that she is cured   . Primary localized osteoarthritis of left knee 03/04/2016  . possible malignant hyperthermia variant post op 03/07/2016   BP 122/86 mmHg  Pulse 103  Resp 14  SpO2 94%  Opioid Risk Score:   Fall Risk Score:  `1  Depression screen PHQ 2/9  Depression screen [facility] 2/9 01/01/2016 12/10/2015 06/25/2015 05/28/2015 01/02/2015  Decreased Interest 2 0 0 1 1  Down,  Depressed, Hopeless 1 0 0 1 1  PHQ - 2 Score 3 0 0 2 2  Altered sleeping 1 - - - 2  Tired, decreased energy 1 - - - 1  Change in appetite 2 - - - 1  Feeling bad or failure about yourself  2 - - - 1  Trouble concentrating 1 - - - 0  Moving slowly or fidgety/restless 1 - - - 0  Suicidal thoughts 0 - - - 0  PHQ-9 Score 11 - - - 7  Difficult doing work/chores Not difficult at all - - - -     Review of Systems  Constitutional: Positive for unexpected weight change.  Eyes: Negative.   Respiratory: Negative.   Cardiovascular: Negative.   Gastrointestinal: Negative.   Endocrine: Negative.   Genitourinary: Negative.   Musculoskeletal: Positive for back pain, arthralgias and gait problem.  Skin: Negative.   Neurological: Positive for weakness and numbness.  All other systems reviewed and are negative.      Objective:   Physical Exam  Constitutional: She appears well-developed and well-nourished.  HENT:  Head: Normocephalic and atraumatic.  Neck: Normal range of motion.  Cardiovascular: Normal rate and regular rhythm.   Pulmonary/Chest: Effort normal and breath sounds normal.  Musculoskeletal:  Normal Muscle Bulk and Muscle Testing Reveals: Upper Extremities: Full ROM and Muscle Strength 5/5 Lumbar Paraspinal Tenderness: L-3- L-5 Lower Extremities: Full ROM and Muscle Strength 5/5 Arises from Table Slowly Wide Based Gait  Neurological: She is alert.  Skin: Skin is warm and dry.  Psychiatric: She has a normal mood and affect.  Nursing note and vitals reviewed.         Assessment & Plan:  1. Fibromyalgia Syndrome: Continue with exercise and heat therapy. Encouraged to increase activity 2. Osteoarthritis of Bilateral Knees:  Orthopedist FollowingContinue with heat and exercise therapy. S?P Left Knee Arthroplasty on 03/04/16 with Dr. Mardelle Matte 3. Left Shoulder OA: S/PLeft Shoulder Arthroplasty. Orthopedist Following. Dr. Mardelle Matte 4. Bipolar Syndrome: On Depakote and  Cymbalta.  5. Lumbar Spondylosis: Refilled: Oxycodone 15 mg one tablet three times a day #90. We will continue the opioid monitoring program, this consists of regular clinic visits, examinations, urine drug screen, pill counts as well as use of [place] Controlled Substance Reporting System. 6. Insomnia: No Complaints continue to monitor 7. Muscle Spasms: Continue Bacolen TID as needed  8. Constipation: Continue Bisacodyl 9. Peripheral Neuropathy: Continue: Lidocaine Patches  20 minutes of face to face patient care time was spent during this visit. All questions were encouraged and answered

## 2016-04-25 LAB — TOXASSURE SELECT,+ANTIDEPR,UR: PDF: 0

## 2016-04-26 NOTE — Outcomes Assessment (Signed)
Entered into patient's chart for Code [employer].

## 2016-05-01 NOTE — Progress Notes (Signed)
Urine drug screen for this encounter is consistent for prescribed medications.   

## 2016-05-14 ENCOUNTER — Encounter: Payer: Medicare HMO | Attending: Physical Medicine and Rehabilitation | Admitting: Registered Nurse

## 2016-05-14 ENCOUNTER — Encounter: Payer: Self-pay | Admitting: Registered Nurse

## 2016-05-14 VITALS — BP 101/71 | HR 100 | Resp 16

## 2016-05-14 DIAGNOSIS — M62838 Other muscle spasm: Secondary | ICD-10-CM | POA: Diagnosis not present

## 2016-05-14 DIAGNOSIS — M609 Myositis, unspecified: Secondary | ICD-10-CM | POA: Insufficient documentation

## 2016-05-14 DIAGNOSIS — G894 Chronic pain syndrome: Secondary | ICD-10-CM

## 2016-05-14 DIAGNOSIS — M791 Myalgia: Secondary | ICD-10-CM | POA: Diagnosis present

## 2016-05-14 DIAGNOSIS — M19019 Primary osteoarthritis, unspecified shoulder: Secondary | ICD-10-CM | POA: Insufficient documentation

## 2016-05-14 DIAGNOSIS — M47817 Spondylosis without myelopathy or radiculopathy, lumbosacral region: Secondary | ICD-10-CM | POA: Insufficient documentation

## 2016-05-14 DIAGNOSIS — M129 Arthropathy, unspecified: Secondary | ICD-10-CM | POA: Diagnosis present

## 2016-05-14 DIAGNOSIS — M12512 Traumatic arthropathy, left shoulder: Secondary | ICD-10-CM | POA: Insufficient documentation

## 2016-05-14 DIAGNOSIS — Z79899 Other long term (current) drug therapy: Secondary | ICD-10-CM | POA: Diagnosis present

## 2016-05-14 DIAGNOSIS — Z5181 Encounter for therapeutic drug level monitoring: Secondary | ICD-10-CM | POA: Diagnosis present

## 2016-05-14 DIAGNOSIS — M25561 Pain in right knee: Secondary | ICD-10-CM | POA: Diagnosis not present

## 2016-05-14 DIAGNOSIS — M19012 Primary osteoarthritis, left shoulder: Secondary | ICD-10-CM | POA: Insufficient documentation

## 2016-05-14 MED ORDER — OXYCODONE HCL 15 MG PO TABS: 15.0000 mg | ORAL_TABLET | Freq: Three times a day (TID) | ORAL | 0 refills | Status: DC | PRN

## 2016-05-14 NOTE — Progress Notes (Signed)
Subjective:    Patient ID: [name], female    DOB: 11/14/1965, 50 y.o.   MRN: [id number]  HPI: [name] is a 50 year old female who returns for follow up appointment for chronic pain and medication refill. She states her pain is located in her bilateral hands and lower back.She rates her pain 5. Her current exercise regime is walking and water aerobics three times a week.  Pain Inventory Average Pain 6 Pain Right Now 5 My pain is sharp, stabbing and aching  In the last 24 hours, has pain interfered with the following? General activity 5 Relation with others 6 Enjoyment of life 6 What TIME of day is your pain at its worst? night Sleep (in general) Poor  Pain is worse with: walking, sitting and standing Pain improves with: rest and medication Relief from Meds: 7  Mobility walk without assistance how many minutes can you walk? 15 ability to climb steps?  yes do you drive?  yes Do you have any goals in this area?  no  Function not employed: date last employed . Do you have any goals in this area?  no  Neuro/Psych spasms  Prior Studies Any changes since last visit?  no  Physicians involved in your care Any changes since last visit?  no   Family History  Problem Relation Age of Onset  . Schizophrenia Mother   . Hypertension Mother   . Stroke Father   . Diabetes Other   . Hypertension Other    Social History   Social History  . Marital status: Divorced    Spouse name: N/A  . Number of children: N/A  . Years of education: N/A   Social History Main Topics  . Smoking status: Current Every Day Smoker    Packs/day: 0.50    Years: 36.00    Types: Cigarettes  . Smokeless tobacco: Never Used  . Alcohol use 0.6 oz/week    1 Shots of liquor per week  . Drug use: No     Comment: goes to pain clinic  . Sexual activity: Not Asked   Other Topics Concern  . None   Social History Narrative  . None   Past Surgical History:  Procedure  Laterality Date  . ABDOMINAL HYSTERECTOMY  1998  . CARDIOVASCULAR STRESS TEST  03/2010   Lexiscan Myoview: EF 55%, mild breast attenuation, no ischemia or scar  . DILATION AND CURETTAGE OF UTERUS  1986   "lost a son"  . JOINT REPLACEMENT    . KNEE ARTHROSCOPY Right 2005  . TONSILLECTOMY    . TOTAL KNEE ARTHROPLASTY Left 03/04/2016   Procedure: TOTAL KNEE ARTHROPLASTY;  Surgeon: Marchia Bond, MD;  Location: [facility];  Service: Orthopedics;  Laterality: Left;  . TOTAL SHOULDER ARTHROPLASTY Left 07/17/2015   Procedure: TOTAL SHOULDER ARTHROPLASTY;  Surgeon: Marchia Bond, MD;  Location: [facility];  Service: Orthopedics;  Laterality: Left;  . TRANSTHORACIC ECHOCARDIOGRAM  03/2010   EF 50-55%, normal LV size, no WMAs  . TUBAL LIGATION  1990   Past Medical History:  Diagnosis Date  . Anxiety   . Bipolar 1 disorder ([place])   . 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 reports that she is cured   . COPD (chronic obstructive pulmonary disease) ([place])   . Depression   . Fibromyalgia   . GERD (gastroesophageal reflux disease)   . Hypertension   . Obesity   . Osteoarthritis    knees, elbows   .  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 Pneumonia    developed in 07/2015- post shoulder replacement   . 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 Primary localized osteoarthritis of left knee 03/04/2016  . PUD (peptic ulcer disease)   . Spinal stenosis    BP 101/71 (BP Location: Left Arm, Patient Position: Sitting, Cuff Size: Large)   Pulse 100   Resp 16   SpO2 91%   Opioid Risk Score:   Fall Risk Score:  `1  Depression screen PHQ 2/9  Depression screen [facility] 2/9 01/01/2016 12/10/2015 06/25/2015 05/28/2015 01/02/2015  Decreased Interest 2 0 0 1 1  Down, Depressed, Hopeless 1 0 0 1 1  PHQ - 2 Score 3 0 0 2 2  Altered sleeping 1 - - - 2  Tired, decreased energy 1 - - - 1  Change in appetite 2 - - - 1  Feeling bad or failure about yourself  2 - - - 1  Trouble  concentrating 1 - - - 0  Moving slowly or fidgety/restless 1 - - - 0  Suicidal thoughts 0 - - - 0  PHQ-9 Score 11 - - - 7  Difficult doing work/chores Not difficult at all - - - -    Review of Systems  Constitutional: Negative.   Eyes: Negative.   Respiratory: Negative.   Cardiovascular: Negative.   Gastrointestinal: Positive for constipation.  Endocrine: Negative.   Genitourinary: Negative.   Musculoskeletal: Positive for arthralgias and back pain.       Spasms   Skin: Negative.   Allergic/Immunologic: Negative.   Neurological: Negative.   Hematological: Negative.   Psychiatric/Behavioral: Negative.   All other systems reviewed and are negative.      Objective:   Physical Exam  Constitutional: She is oriented to person, place, and time. She appears well-developed and well-nourished.  HENT:  Head: Normocephalic and atraumatic.  Neck: Normal range of motion. Neck supple.  Cardiovascular: Normal rate and regular rhythm.   Pulmonary/Chest: Effort normal and breath sounds normal.  Musculoskeletal:  Normal Muscle Bulk and Muscle Testing Reveals: Upper Extremities: Full ROM and Muscle Strength 5/5 Lumbar Paraspinal Tenderness: L-3- L-5 Lower Extremities: Full ROM and Muscle Strength 5/5 Right Lower Extremity Flexion Produces Pain into Patella Arises from table with ease Narrow Based Gait  Neurological: She is alert and oriented to person, place, and time.  Skin: Skin is warm and dry.  Psychiatric: She has a normal mood and affect.  Nursing note and vitals reviewed.         Assessment & Plan:  1. Fibromyalgia Syndrome: Continue with exercise and heat therapy. Encouraged to increase activity 2. Osteoarthritis of Bilateral Knees:  Orthopedist FollowingContinue with heat and exercise therapy. S/P Left Knee Arthroplasty on 03/04/16 by Dr. Mardelle Matte 3. Left Shoulder OA: S/PLeft Shoulder Arthroplasty. Orthopedist Following. Dr. Mardelle Matte 4. Bipolar Syndrome: On Depakote and  Cymbalta.  5. Lumbar Spondylosis: Refilled: Oxycodone 15 mg one tablet three times a day #90. Second script given to accommodate scheduled appointment. We will continue the opioid monitoring program, this consists of regular clinic visits, examinations, urine drug screen, pill counts as well as use of [place] Controlled Substance Reporting System. 6. Insomnia: No Complaints continue to monitor. Continue Elavil 7. Muscle Spasms: Continue Bacolen TID as needed  8. Constipation: Continue Bisacodyl 9. Peripheral Neuropathy: Continue: Lidocaine Patches  20 minutes of face to face patient care time was spent during this visit. All questions were encouraged and answered

## 2016-06-13 ENCOUNTER — Other Ambulatory Visit: Payer: Self-pay | Admitting: Registered Nurse

## 2016-06-17 ENCOUNTER — Emergency Department (HOSPITAL_COMMUNITY)
Admission: EM | Admit: 2016-06-17 | Discharge: 2016-06-17 | Disposition: A | Discharge status: Home / Self Care | Payer: Medicare HMO | Attending: Emergency Medicine | Admitting: Emergency Medicine

## 2016-06-17 ENCOUNTER — Encounter (HOSPITAL_COMMUNITY): Payer: Self-pay | Admitting: *Deleted

## 2016-06-17 ENCOUNTER — Emergency Department (HOSPITAL_COMMUNITY): Payer: Medicare HMO

## 2016-06-17 DIAGNOSIS — M25561 Pain in right knee: Secondary | ICD-10-CM | POA: Diagnosis present

## 2016-06-17 DIAGNOSIS — Y9289 Other specified places as the place of occurrence of the external cause: Secondary | ICD-10-CM | POA: Insufficient documentation

## 2016-06-17 DIAGNOSIS — Y999 Unspecified external cause status: Secondary | ICD-10-CM | POA: Insufficient documentation

## 2016-06-17 DIAGNOSIS — Z96652 Presence of left artificial knee joint: Secondary | ICD-10-CM | POA: Diagnosis not present

## 2016-06-17 DIAGNOSIS — J449 Chronic obstructive pulmonary disease, unspecified: Secondary | ICD-10-CM | POA: Insufficient documentation

## 2016-06-17 DIAGNOSIS — Z79899 Other long term (current) drug therapy: Secondary | ICD-10-CM | POA: Insufficient documentation

## 2016-06-17 DIAGNOSIS — X501XXA Overexertion from prolonged static or awkward postures, initial encounter: Secondary | ICD-10-CM | POA: Insufficient documentation

## 2016-06-17 DIAGNOSIS — Y9301 Activity, walking, marching and hiking: Secondary | ICD-10-CM | POA: Diagnosis not present

## 2016-06-17 DIAGNOSIS — I10 Essential (primary) hypertension: Secondary | ICD-10-CM | POA: Insufficient documentation

## 2016-06-17 DIAGNOSIS — Z8541 Personal history of malignant neoplasm of cervix uteri: Secondary | ICD-10-CM | POA: Diagnosis not present

## 2016-06-17 DIAGNOSIS — F17210 Nicotine dependence, cigarettes, uncomplicated: Secondary | ICD-10-CM | POA: Diagnosis not present

## 2016-06-17 DIAGNOSIS — Z96612 Presence of left artificial shoulder joint: Secondary | ICD-10-CM | POA: Diagnosis not present

## 2016-06-17 DIAGNOSIS — Z7982 Long term (current) use of aspirin: Secondary | ICD-10-CM | POA: Insufficient documentation

## 2016-06-17 DIAGNOSIS — Z8542 Personal history of malignant neoplasm of other parts of uterus: Secondary | ICD-10-CM | POA: Insufficient documentation

## 2016-06-17 DIAGNOSIS — Z7901 Long term (current) use of anticoagulants: Secondary | ICD-10-CM | POA: Insufficient documentation

## 2016-06-17 MED ORDER — KETOROLAC TROMETHAMINE 30 MG/ML IJ SOLN
30.0000 mg | Freq: Once | INTRAMUSCULAR | Status: AC
  Administered 2016-06-17: 30 mg via INTRAVENOUS
  Filled 2016-06-17: qty 1

## 2016-06-17 MED ORDER — IBUPROFEN 800 MG PO TABS: 800.0000 mg | ORAL_TABLET | Freq: Three times a day (TID) | ORAL | 0 refills | Status: DC

## 2016-06-17 NOTE — ED Provider Notes (Signed)
[facility] DEPT Provider Note   CSN: [id number] Arrival date & time: 06/17/16  0848  By signing my name below, I, [name], attest that this documentation has been prepared under the direction and in the presence of Margarita Mail, PA-C. Electronically Signed: Rayna Walter, ED Scribe. 06/17/16. 9:17 AM.   History   Chief Complaint Chief Complaint  Patient presents with  . Knee Pain    HPI HPI Comments: [name] is a 50 y.o. female with a h/o right knee arthroscopy, total left knee arthroplasty, and right knee arthritis who presents to the Emergency Department complaining of sudden onset, moderate, right medial, knee pain onset at 2:15 AM. Pt states she was ambulating to the bathroom this morning and heard a pop in the affected knee and a sudden onset of her pain which she describes as a burning sensation. Her pain worsens with ambulation. She takes 15 mg oxycodone with her last dose this morning and denies significant relief. Pt ambulates with a walker at baseline. She denies other associated symptoms at this time.   The history is provided by the patient. No language interpreter was used.    Past Medical History:  Diagnosis Date  . Anxiety   . Bipolar 1 disorder ([place])   . 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 reports that she is cured   . COPD (chronic obstructive pulmonary disease) ([place])   . Depression   . Fibromyalgia   . GERD (gastroesophageal reflux disease)   . Hypertension   . Obesity   . Osteoarthritis    knees, elbows   . 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 Pneumonia    developed in 07/2015- post shoulder replacement   . 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 Primary localized osteoarthritis of left knee 03/04/2016  . PUD (peptic ulcer disease)   . Spinal stenosis     Patient Active Problem List   Diagnosis Date Noted  . Postoperative anemia due to acute blood loss 03/07/2016  . possible  malignant hyperthermia variant post op 03/07/2016  . Sinus tachycardia ([place]) 03/05/2016  . GERD (gastroesophageal reflux disease) 03/05/2016  . SVT (supraventricular tachycardia) ([place])   . Atelectasis   . Absolute anemia   . Arterial hypotension   . Pulmonary emphysema ([place])   . Primary localized osteoarthritis of left knee 03/04/2016  . S/P total knee arthroplasty 03/04/2016  . Sepsis ([place]) 07/19/2015  . Tobacco abuse 07/19/2015  . COPD (chronic obstructive pulmonary disease) ([place]) 07/19/2015  . S/P shoulder replacement   . Tachycardia   . Tremor of both hands 02/28/2015  . Lumbar facet arthropathy 02/28/2015  . Chest pain at rest 12/25/2013  . Chronic pain disorder 12/25/2013  . Chest pain due to psychological stress 12/25/2013  . Insomnia, unspecified 10/04/2013  . Insomnia 08/03/2013  . 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 Unspecified hereditary and idiopathic peripheral neuropathy 05/10/2013  . Lumbosacral spondylosis without myelopathy 05/10/2013  . Osteoarthritis of both knees 05/10/2013  . Left rotator cuff tear arthropathy 05/10/2013  . RESTLESS LEG SYNDROME 05/26/2009  . HYPERSOMNIA, ASSOCIATED WITH SLEEP APNEA 03/28/2009  . Anxiety state 02/23/2009  . Depression 02/23/2009  . ARTHRITIS 02/23/2009  . DYSPHAGIA UNSPECIFIED 02/23/2009  . CERVICAL CANCER, HX OF 02/23/2009  . Bipolar disorder ([place]) 02/21/2009  . GERD 02/21/2009  . IRRITABLE BOWEL SYNDROME 02/21/2009  . NECK PAIN 02/21/2009  . Myalgia and myositis 02/21/2009    Past Surgical History:  Procedure Laterality  Date  . ABDOMINAL HYSTERECTOMY  1998  . CARDIOVASCULAR STRESS TEST  03/2010   Lexiscan Myoview: EF 55%, mild breast attenuation, no ischemia or scar  . DILATION AND CURETTAGE OF UTERUS  1986   "lost a son"  . JOINT REPLACEMENT    . KNEE ARTHROSCOPY Right 2005  . TONSILLECTOMY    . TOTAL KNEE ARTHROPLASTY Left 03/04/2016   Procedure: TOTAL KNEE ARTHROPLASTY;  Surgeon: Marchia Bond,  MD;  Location: [facility];  Service: Orthopedics;  Laterality: Left;  . TOTAL SHOULDER ARTHROPLASTY Left 07/17/2015   Procedure: TOTAL SHOULDER ARTHROPLASTY;  Surgeon: Marchia Bond, MD;  Location: [facility];  Service: Orthopedics;  Laterality: Left;  . TRANSTHORACIC ECHOCARDIOGRAM  03/2010   EF 50-55%, normal LV size, no WMAs  . TUBAL LIGATION  1990    OB History    No data available     Home Medications    Prior to Admission medications   Medication Sig Start Date End Date Taking? Authorizing Provider  albuterol (PROAIR HFA) 108 (90 Base) MCG/ACT inhaler Inhale 2 puffs into the lungs every 6 (six) hours as needed for wheezing or shortness of breath.  10/04/15 10/03/16  Historical Provider, MD  ALPRAZolam Duanne Moron) 1 MG tablet Take 1 tablet (1 mg total) by mouth 3 (three) times daily. 03/13/16   Tiffany L Reed, DO  amitriptyline (ELAVIL) 50 MG tablet TAKE 1 TABLET BY MOUTH EVERY NIGHT AT BEDTIME 02/25/16   Bayard Hugger, NP  aspirin 81 MG chewable tablet Chew 81 mg by mouth at bedtime.     Historical Provider, MD  baclofen (LIORESAL) 10 MG tablet TAKE 1 TABLET BY MOUTH THREE TIMES DAILY 03/18/16   Meredith Staggers, MD  divalproex (DEPAKOTE ER) 500 MG 24 hr tablet Take 1,000 mg by mouth every evening.     Historical Provider, MD  DULoxetine (CYMBALTA) 60 MG capsule Take 60 mg by mouth daily.    Historical Provider, MD  hydrochlorothiazide (HYDRODIURIL) 12.5 MG tablet Take 12.5 mg by mouth daily.  06/19/15   Historical Provider, MD  ibuprofen (ADVIL,MOTRIN) 800 MG tablet Take 1 tablet (800 mg total) by mouth 3 (three) times daily. 06/17/16   Margarita Mail, PA-C  lidocaine (LIDODERM) 5 % Place 1 patch onto the skin every 12 (twelve) hours. Remove & Discard patch within 12 hours or as directed 10/29/15   Bayard Hugger, NP  nitroGLYCERIN (NITROSTAT) 0.4 MG SL tablet Place 0.4 mg under the tongue every 5 (five) minutes as needed for chest pain.  06/11/15   Historical Provider, MD  oxyCODONE (ROXICODONE) 15 MG  immediate release tablet Take 1 tablet (15 mg total) by mouth every 8 (eight) hours as needed for pain. 05/14/16   Bayard Hugger, NP  polyethylene glycol (MIRALAX / GLYCOLAX) packet Take 17 g by mouth daily as needed for mild constipation.    Historical Provider, MD  ranitidine (ZANTAC) 150 MG tablet Take 150 mg by mouth 2 (two) times daily as needed for heartburn.    Historical Provider, MD  rivaroxaban (XARELTO) 10 MG TABS tablet Take 1 tablet (10 mg total) by mouth daily. 03/04/16   Marchia Bond, MD  sennosides-docusate sodium (SENOKOT-S) 8.6-50 MG tablet Take 2 tablets by mouth daily. 03/04/16   Marchia Bond, MD    Family History Family History  Problem Relation Age of Onset  . Schizophrenia Mother   . Hypertension Mother   . Stroke Father   . Diabetes Other   . Hypertension Other  Social History Social History  Substance Use Topics  . Smoking status: Current Every Day Smoker    Packs/day: 0.50    Years: 36.00    Types: Cigarettes  . Smokeless tobacco: Never Used  . Alcohol use 0.6 oz/week    1 Shots of liquor per week     Allergies   Abilify [aripiprazole]; Latuda [lurasidone hcl]; Naproxen; and Wellbutrin [bupropion]   Review of Systems Review of Systems  Constitutional: Negative for chills and fever.  Musculoskeletal: Positive for arthralgias.  Skin: Negative for color change and wound.  Neurological: Negative for numbness.   Physical Exam Updated Vital Signs BP 125/68   Pulse 95   Temp 98.2 F (36.8 C) (Oral)   Resp 16   Ht 5\' 2"  (1.575 m)   Wt 117.9 kg   SpO2 96%   BMI 47.55 kg/m   Physical Exam  Constitutional: She is oriented to person, place, and time. She appears well-developed.  Morbidly obese  HENT:  Head: Normocephalic and atraumatic.  Eyes: EOM are normal. Pupils are equal, round, and reactive to light.  Pinpoint pupils  Neck: Normal range of motion.  Cardiovascular: Normal rate.   Pulmonary/Chest: Effort normal. No respiratory  distress.  Abdominal: Soft.  Musculoskeletal: Normal range of motion. She exhibits tenderness.  Ligamentous laxity to the medial aspect of the right knee. TTP to the right medial knee. Pain reproducible with right knee movement.   Neurological: She is alert and oriented to person, place, and time.  Skin: Skin is warm and dry.  Psychiatric: She has a normal mood and affect.  Nursing note and vitals reviewed.  ED Treatments / Results  Labs (all labs ordered are listed, but only abnormal results are displayed) Labs Reviewed - No data to display  EKG  EKG Interpretation None       Radiology Dg Knee Complete 4 Views Right  Result Date: 06/17/2016 CLINICAL DATA:  Recent twisting injury to the right knee, initial encounter EXAM: RIGHT KNEE - COMPLETE 4+ VIEW COMPARISON:  12/18/2015 FINDINGS: Significant medial joint space narrowing is noted with subsequent osteophytes both medially and laterally. Narrowing of the patellofemoral space with osteophytic change is noted as well. No significant joint effusion is seen. No acute fracture is noted. Mild lateral tilt of the patella is noted. IMPRESSION: Tricompartmental degenerative change worst in the medial joint compartment. No acute abnormality is noted. Electronically Signed   By: Inez Catalina M.D.   On: 06/17/2016 09:52    Procedures Procedures  DIAGNOSTIC STUDIES: Oxygen Saturation is 96% on RA, normal by my interpretation.    COORDINATION OF CARE: 9:16 AM Discussed next steps with pt. Pt verbalized understanding and is agreeable with the plan.    Medications Ordered in ED Medications  ketorolac (TORADOL) 30 MG/ML injection 30 mg (30 mg Intravenous Given 06/17/16 0959)     Initial Impression / Assessment and Plan / ED Course  I have reviewed the triage vital signs and the nursing notes.  Pertinent labs & imaging results that were available during my care of the patient were reviewed by me and considered in my medical decision making  (see chart for details).  Clinical Course    Patient X-Ray negative for obvious fracture or dislocation.  Pt already has an appointment with her orthopaedist on 06/23/2016, I got her an appointment sooner for the 9/8.  Patient given ACE wrap while in ED, she has a walker at home. conservative therapy recommended and discussed. Patient will be discharged home &  is agreeable with above plan. Returns precautions discussed. Pt appears safe for discharge.  I personally performed the services described in this documentation, which was scribed in my presence. The recorded information has been reviewed and is accurate.     Final Clinical Impressions(s) / ED Diagnoses   Final diagnoses:  Right knee pain    New Prescriptions New Prescriptions   IBUPROFEN (ADVIL,MOTRIN) 800 MG TABLET    Take 1 tablet (800 mg total) by mouth 3 (three) times daily.     Margarita Mail, PA-C 06/17/16 [address], PA-C 06/17/16 [place], MD 06/17/16 1024

## 2016-06-17 NOTE — ED Notes (Signed)
Given Ice pack per patients request

## 2016-06-17 NOTE — Discharge Instructions (Signed)
Your xray shows advanced arthritis. Use your walker at home. Apply ice. Follow up with Dr. Mardelle Matte on Friday.

## 2016-06-17 NOTE — ED Triage Notes (Signed)
Pt reports walking to the bathroom this morning and having pain in her rt knee. Pt states that she has been told that the knee is "bad" in the past.

## 2016-06-23 ENCOUNTER — Encounter: Payer: Medicare HMO | Attending: Physical Medicine and Rehabilitation | Admitting: Physical Medicine & Rehabilitation

## 2016-06-23 ENCOUNTER — Encounter: Payer: Self-pay | Admitting: Physical Medicine & Rehabilitation

## 2016-06-23 VITALS — BP 114/82 | HR 99

## 2016-06-23 DIAGNOSIS — M12512 Traumatic arthropathy, left shoulder: Secondary | ICD-10-CM | POA: Diagnosis present

## 2016-06-23 DIAGNOSIS — M19012 Primary osteoarthritis, left shoulder: Secondary | ICD-10-CM | POA: Insufficient documentation

## 2016-06-23 DIAGNOSIS — Z79899 Other long term (current) drug therapy: Secondary | ICD-10-CM | POA: Diagnosis present

## 2016-06-23 DIAGNOSIS — M791 Myalgia: Secondary | ICD-10-CM | POA: Diagnosis present

## 2016-06-23 DIAGNOSIS — M609 Myositis, unspecified: Secondary | ICD-10-CM | POA: Insufficient documentation

## 2016-06-23 DIAGNOSIS — M129 Arthropathy, unspecified: Secondary | ICD-10-CM | POA: Diagnosis present

## 2016-06-23 DIAGNOSIS — F3170 Bipolar disorder, currently in remission, most recent episode unspecified: Secondary | ICD-10-CM

## 2016-06-23 DIAGNOSIS — M170 Bilateral primary osteoarthritis of knee: Secondary | ICD-10-CM | POA: Diagnosis not present

## 2016-06-23 DIAGNOSIS — M19019 Primary osteoarthritis, unspecified shoulder: Secondary | ICD-10-CM | POA: Diagnosis present

## 2016-06-23 DIAGNOSIS — IMO0001 Reserved for inherently not codable concepts without codable children: Secondary | ICD-10-CM

## 2016-06-23 DIAGNOSIS — M47817 Spondylosis without myelopathy or radiculopathy, lumbosacral region: Secondary | ICD-10-CM | POA: Insufficient documentation

## 2016-06-23 DIAGNOSIS — Z5181 Encounter for therapeutic drug level monitoring: Secondary | ICD-10-CM | POA: Diagnosis present

## 2016-06-23 MED ORDER — OXYCODONE HCL 15 MG PO TABS: 15.0000 mg | ORAL_TABLET | Freq: Three times a day (TID) | ORAL | 0 refills | Status: DC | PRN

## 2016-06-23 NOTE — Progress Notes (Signed)
Subjective:    Patient ID: [name], female    DOB: 1966-01-17, 50 y.o.   MRN: [id number]  HPI   [name] is here in follow up of her chronic pain. She had a "pop" in the left knee the other night and went to the ED. Work up was negative. The left knee has since improved. She does have a TKA planned on the right knee in October.   Otherwise, she's doing fairly well. Her pain medication is working. She tolerates the oxycodone without side effects. She is moving her bowels regularly.   Her plan is to lose weight and focus on her health after the next knee surgery.   Pain Inventory Average Pain 6 Pain Right Now 7 My pain is sharp, burning, stabbing and aching  In the last 24 hours, has pain interfered with the following? General activity 5 Relation with others 5 Enjoyment of life 4 What TIME of day is your pain at its worst? evening Sleep (in general) Fair  Pain is worse with: walking and standing Pain improves with: rest and medication Relief from Meds: 8  Mobility walk without assistance how many minutes can you walk? 30 ability to climb steps?  yes do you drive?  yes Do you have any goals in this area?  no  Function disabled: date disabled . Do you have any goals in this area?  no  Neuro/Psych No problems in this area  Prior Studies Any changes since last visit?  no  Physicians involved in your care Any changes since last visit?  no   Family History  Problem Relation Age of Onset  . Schizophrenia Mother   . Hypertension Mother   . Stroke Father   . Diabetes Other   . Hypertension Other    Social History   Social History  . Marital status: Divorced    Spouse name: N/A  . Number of children: N/A  . Years of education: N/A   Social History Main Topics  . Smoking status: Current Every Day Smoker    Packs/day: 0.50    Years: 36.00    Types: Cigarettes  . Smokeless tobacco: Never Used  . Alcohol use 0.6 oz/week    1 Shots of liquor per week    . Drug use: No     Comment: goes to pain clinic  . Sexual activity: Not Asked   Other Topics Concern  . None   Social History Narrative  . None   Past Surgical History:  Procedure Laterality Date  . ABDOMINAL HYSTERECTOMY  1998  . CARDIOVASCULAR STRESS TEST  03/2010   Lexiscan Myoview: EF 55%, mild breast attenuation, no ischemia or scar  . DILATION AND CURETTAGE OF UTERUS  1986   "lost a son"  . JOINT REPLACEMENT    . KNEE ARTHROSCOPY Right 2005  . TONSILLECTOMY    . TOTAL KNEE ARTHROPLASTY Left 03/04/2016   Procedure: TOTAL KNEE ARTHROPLASTY;  Surgeon: Marchia Bond, MD;  Location: [facility];  Service: Orthopedics;  Laterality: Left;  . TOTAL SHOULDER ARTHROPLASTY Left 07/17/2015   Procedure: TOTAL SHOULDER ARTHROPLASTY;  Surgeon: Marchia Bond, MD;  Location: [facility];  Service: Orthopedics;  Laterality: Left;  . TRANSTHORACIC ECHOCARDIOGRAM  03/2010   EF 50-55%, normal LV size, no WMAs  . TUBAL LIGATION  1990   Past Medical History:  Diagnosis Date  . Anxiety   . Bipolar 1 disorder ([place])   . Cancer ([place])    uterine CA/- resulted in hysterectomy, pt.  reports that she is cured   . COPD (chronic obstructive pulmonary disease) ([place])   . Depression   . Fibromyalgia   . GERD (gastroesophageal reflux disease)   . Hypertension   . Obesity   . Osteoarthritis    knees, elbows   . 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 Pneumonia    developed in 07/2015- post shoulder replacement   . 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 Primary localized osteoarthritis of left knee 03/04/2016  . PUD (peptic ulcer disease)   . Spinal stenosis    BP 114/82   Pulse 99   SpO2 92%   Opioid Risk Score:   Fall Risk Score:  `1  Depression screen PHQ 2/9  Depression screen [facility] 2/9 01/01/2016 12/10/2015 06/25/2015 05/28/2015 01/02/2015  Decreased Interest 2 0 0 1 1  Down, Depressed, Hopeless 1 0 0 1 1  PHQ - 2 Score 3 0 0 2 2  Altered sleeping 1 - - - 2  Tired, decreased  energy 1 - - - 1  Change in appetite 2 - - - 1  Feeling bad or failure about yourself  2 - - - 1  Trouble concentrating 1 - - - 0  Moving slowly or fidgety/restless 1 - - - 0  Suicidal thoughts 0 - - - 0  PHQ-9 Score 11 - - - 7  Difficult doing work/chores Not difficult at all - - - -    Review of Systems  Constitutional: Negative.   HENT: Negative.   Eyes: Negative.   Respiratory: Negative.   Cardiovascular: Negative.   Gastrointestinal: Negative.   Endocrine: Negative.   Genitourinary: Negative.   Musculoskeletal: Positive for back pain.  Skin: Negative.   Allergic/Immunologic: Negative.   Neurological: Negative.   Hematological: Negative.   Psychiatric/Behavioral: Negative.        Objective:   Physical Exam   Constitutional: She appears well-developed and well-nourished.  HENT:  Head: Normocephalic and atraumatic.  Neck: Normal range of motion.  Cardiovascular: Normal rate and regular rhythm.   Pulmonary/Chest: Effort normal and breath sounds normal.  Musculoskeletal:  Normal Muscle Bulk and Muscle Testing Reveals: Upper Extremities: Full ROM and Muscle Strength 5/5 Lumbar Paraspinal Tenderness: L-3- L-5 Lower Extremities: Full ROM and Muscle Strength 5/5 -medial and lateral joint line tenderness right knee with crepitus -valgus deformity right knee -crepitus left knee.  Arises from Table Somewhat slowly Wide Based Gait  Neurological: She is alert.  Skin: Skin is warm and dry.  Psychiatric: She has a normal mood and affect.  Nursing note and vitals reviewed.         Assessment & Plan:  1. Fibromyalgia Syndrome: Continue with exercise and heat therapy. Encouraged to increase activity 2. Osteoarthritis of Bilateral Knees:    S/P: Left Knee Arthroplasty on 03/04/16 with Dr. Mardelle Matte    -right TKA scheduled for October 3. Left Shoulder OA: S/PLeft Shoulder Arthroplasty.per Dr. Mardelle Matte 4. Bipolar Syndrome: On Depakote and Cymbalta.   -doing fairly  well in this area 5. Lumbar Spondylosis: Refilled: Oxycodone 15 mg one tablet three times a day #90.  -continue the opioid monitoring program, this consists of regular clinic visits, examinations, urine drug screen, pill counts as well as use of [place] Controlled Substance Reporting System. 6. Insomnia: improved  7. Muscle Spasms: Continue Bacolen TID as needed  8. Constipation: Continue Bisacodyl 9. Peripheral Neuropathy: Continue: Lidocaine Patches  15 minutes of face to face patient care time was spent during this  visit. All questions were encouraged and answered

## 2016-06-23 NOTE — Patient Instructions (Signed)
PLEASE CALL ME WITH ANY PROBLEMS OR QUESTIONS ([phone])  

## 2016-06-30 ENCOUNTER — Other Ambulatory Visit: Payer: Self-pay | Admitting: Orthopedic Surgery

## 2016-07-04 ENCOUNTER — Encounter (HOSPITAL_COMMUNITY)
Admission: RE | Admit: 2016-07-04 | Discharge: 2016-07-04 | Disposition: A | Discharge status: Home / Self Care | Payer: Medicare HMO | Source: Ambulatory Visit | Attending: Orthopedic Surgery | Admitting: Orthopedic Surgery

## 2016-07-04 ENCOUNTER — Encounter (HOSPITAL_COMMUNITY): Payer: Self-pay

## 2016-07-04 DIAGNOSIS — R9431 Abnormal electrocardiogram [ECG] [EKG]: Secondary | ICD-10-CM | POA: Insufficient documentation

## 2016-07-04 DIAGNOSIS — M1711 Unilateral primary osteoarthritis, right knee: Secondary | ICD-10-CM | POA: Insufficient documentation

## 2016-07-04 DIAGNOSIS — Z01810 Encounter for preprocedural cardiovascular examination: Secondary | ICD-10-CM | POA: Insufficient documentation

## 2016-07-04 DIAGNOSIS — Z01818 Encounter for other preprocedural examination: Secondary | ICD-10-CM | POA: Insufficient documentation

## 2016-07-04 HISTORY — DX: Insomnia, unspecified: G47.00

## 2016-07-04 HISTORY — DX: Headache: R51

## 2016-07-04 HISTORY — DX: Headache, unspecified: R51.9

## 2016-07-04 HISTORY — DX: Anemia, unspecified: D64.9

## 2016-07-04 LAB — BASIC METABOLIC PANEL
ANION GAP: 11 (ref 5–15)
BUN: 11 mg/dL (ref 6–20)
CALCIUM: 10.2 mg/dL (ref 8.9–10.3)
CO2: 30 mmol/L (ref 22–32)
Chloride: 100 mmol/L — ABNORMAL LOW (ref 101–111)
Creatinine, Ser: 0.79 mg/dL (ref 0.44–1.00)
Glucose, Bld: 109 mg/dL — ABNORMAL HIGH (ref 65–99)
POTASSIUM: 3.8 mmol/L (ref 3.5–5.1)
Sodium: 141 mmol/L (ref 135–145)

## 2016-07-04 LAB — SURGICAL PCR SCREEN
MRSA, PCR: NEGATIVE
STAPHYLOCOCCUS AUREUS: NEGATIVE

## 2016-07-04 LAB — CBC
HEMATOCRIT: 41 % (ref 36.0–46.0)
HEMOGLOBIN: 12.8 g/dL (ref 12.0–15.0)
MCH: 25.4 pg — ABNORMAL LOW (ref 26.0–34.0)
MCHC: 31.2 g/dL (ref 30.0–36.0)
MCV: 81.3 fL (ref 78.0–100.0)
Platelets: 300 10*3/uL (ref 150–400)
RBC: 5.04 MIL/uL (ref 3.87–5.11)
RDW: 15.4 % (ref 11.5–15.5)
WBC: 7.3 10*3/uL (ref 4.0–10.5)

## 2016-07-04 NOTE — Progress Notes (Signed)
   07/04/16 1027  [name]  Have you ever been diagnosed with sleep apnea through a sleep study? No  Do you snore loudly (loud enough to be heard through closed doors)?  1  Do you often feel tired, fatigued, or sleepy during the daytime (such as falling asleep during driving or talking to someone)? 0  Has anyone observed you stop breathing during your sleep? 0  Do you have, or are you being treated for high blood pressure? 1  BMI more than 35 kg/m2? 1  Age > 50 (1-yes) 1  Neck circumference greater than:Female 16 inches or larger, Female 17inches or larger? 1  Female Gender (Yes=1) 0  Obstructive Sleep Apnea Score 5

## 2016-07-04 NOTE — Pre-Procedure Instructions (Signed)
    ISOBELLE HECKENDORF  07/04/2016      [id number] - [place], [place] - [place] [address] Phone: [phone] Fax: [phone]  [facility] [place], [place] [address] Phone: [phone] Fax: [phone]  [facility] [place], [place] [address] Phone: [phone] Fax: [phone]    Your procedure is scheduled on Tuesday, July 15, 2016  Report to [facility] Admitting at 5:30 A.M.  Call this number if you have problems the morning of surgery:  [phone]   Remember:  Do not eat food or drink liquids after midnight Monday, July 14, 2016  Take these medicines the morning of surgery with A SIP OF WATER :  Baclofen   ( Lioresal),  Alprazolam ( Xanax),  if needed: oxycodone for pain,  Nitroglycerin for chest pain, Albuterol  ( Proair ) inhaler for wheezing and shortness of breath ( bring inhaler in with you on day of surgery).  Stop taking Aspirin, vitamins, fish oil and herbal medications such as Melatonin . Do not take any NSAIDs ie: Ibuprofen, Advil, Naproxen, BC and Goody Powder or any medication containing Aspirin ; stop Tuesday, July 08, 2016.  Do not wear jewelry, make-up or nail polish.  Do not wear lotions, powders, or perfumes, or deoderant.  Do not shave 48 hours prior to surgery.  Do not bring valuables to the hospital.  [facility] is not responsible for any belongings or valuables.  Contacts, dentures or bridgework may not be worn into surgery.  Leave your suitcase in the car.  After surgery it may be brought to your room.  For patients admitted to the hospital, discharge time will be determined by your treatment team.  Patients discharged the day of surgery will not be allowed to drive home.    Name and phone number of your driver:   Special instructions: Shower the night before surgery and the morning of surgery with CHG.  Please read over the following fact sheets that you were given. Pain Booklet, Coughing and Deep Breathing, MRSA Information and Surgical Site Infection Prevention

## 2016-07-04 NOTE — Progress Notes (Signed)
Anesthesia PAT Evaluation: Patient is a 50 year old female scheduled for right TKA on 07/15/16 by Dr. Mardelle Matte. 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 She is posted for a first case.   PMH includes: HTN, bipolar 1 disorder, anxiety, smoking, COPD, fibromyalgia, PUD, GERD, uterine cancer s/p hysterectomy, tonsillectomy, morbid obesity, s/p left total shoulder 07/17/15, s/p left TKA 03/04/16.    Her 03/04/16 post-operative course was complicated by sinus tachycardia then SVT up 240 bpm late on POD #1. Code was initially called, but cancelled and ultimately treated with adenosine (6mg  then 12 mg, followed by 2 second pause then ST 140-150's with PVCs) and was transferred to ICU. She had rectal temp of 103.9 and was started on vancomycin, CTA ruled out PE, but showed possible pancreatitis (amylase normal at 61, lipase elevated at 98 [11-51]). Lactic acid elevated at 3.6. Blood cultures negative X 2. Dr. Titus Mould was investigating POSSIBLE MALIGNANT HYPERTHERMIA VARIANT. CPK normal at 102. Tachycardia improved with IVF and fever control. She also received 4 Units PRBC (HGB 6.4 on 03/06/16) for acute blood loss anemia to get HGB up to 10. She was received a significant amount of narcotics to control her pain. (She has baseline chronic pain and narcotic dependency.) She was followed by cardiology (Dr. Christa See and Dr. Quay Burow) and PCCM during her hospitalization. She was discharged to rehab facility on 03/12/16. Of note she had a fever of 102.9 on POD#2 after her shoulder replacement in 07/2015, but CXR showed possible left lung infiltrate. She also had postoperative tachycardia then, but no SVT. Hospitalist was consulted for sepsis, likely due to "PAN." She was treated with Vancomycin/Zosyn, Xopenex, fluids. Blood culture were negative.  ANESTHESIA  RECORDS:  03/04/16 (left TKA): Femoral nerve block and GETA (Grade I view, IV induction, Glidescope and 4 to place 7.00 mm ETT, 1 attempt. Difficult airway anticipated due to large tongue.) Meds given include desflurane, fentanyl, vecuronium, ketamine, midazolam, lidocaine, propofol, ropivacaine, succinylcholine. 07/17/15 (left total shoulder): Interscalene brachial plexus block with GETA (Grade IV view, IV induction, two handed mask ventilation, glidescope and 3 to place 7.5 mm ETT, 3 attempts). Meds given include sevoflurane, midazolam, fentanyl, lidocaine, propofol, rocuronium, ondansetron, neostigmine, glycopyrrolate, bupivacaine, succinylcholine, phenyl Afrin, Cefazolin, vasopressin.  PCP is listed as Dr. Molli Barrows. She does not routinely see a cardiologist but was seen by Dr. Jenell Milliner in 2011 with negative testing and was admitted in 12/2013 for chest pain with normal coronaries by cardiac CT (saw Dr. Mare Ferrari).  Medications include: Albuterol, Xanax, aspirin 81 mg, baclofen, Depakote ER, Cymbalta, 65 FE, HCTZ, Lidoderm, melatonin, nitroglycerin, oxycodone. Her PCP is having her decrease Cymbalta every other day to help decrease bleeding risk. ASA, supplements to be held one week prior to.   BP 122/78   Pulse 97   Temp 36.7 C (Oral)   Resp 18   Ht 5\' 2"  (1.575 m)   Wt 287 lb 7 oz (130.4 kg)   SpO2 95%   BMI 52.57 kg/m  Exam shows a pleasant Caucasian female in NAD. She is in a wheelchair. Heart RRR, no murmur noted. Lungs clear.  07/04/16 EKG: NSR, low voltage QRS, non-specific T wave abnormality.  03/05/16 Echo: Study Conclusions - Left ventricle: The cavity size was normal. Wall thickness was   normal. Systolic function was vigorous. The estimated ejection   fraction was in the range of 65% to  70%. Wall motion was normal;   there were no regional wall motion abnormalities. - Mitral valve: There was mild regurgitation. Impressions: - Compared to the prior study, there has  been no significant   interval change.  12/26/13 Cardiac CT: IMPRESSION: 1) Calcium Score 0 2) Normal right dominant coronary arteries 3) Ascending Aorta normal 3.5 cm  04/09/10 Nuclear stress test:  -Exercise Capacity: [place] study with no exercise. -BP Response: Normal blood pressure response. -Clinical Symptoms: There is chest pain -ECG Impression: No significant ST segment change suggestive of ischemia. -Overall Impression: There is mild breast attenuation but no sign of scar or ischemia.  03/05/16 CTA Chest: IMPRESSION: 1. No central pulmonary embolus.  Cannot assess subsegmental levels. 2. Scattered atelectasis in both lungs, no pneumonia. 3. Question of peripancreatic soft tissue stranding in the upper abdomen. Can be seen with acute pancreatitis. Motion artifact is felt less likely. Recommend correlation with pancreatic enzymes.  Preoperative labs noted. H/H 12.8/41.0. Cr 0.79.  Patient and daughter with several questions regarding possibility of malignant hyperthermia. They were given information regarding contacting [facility] Anesthesia Department to inquire about further evaluation at their [facility]. Also discussed that family members would also need to notify their anesthesiologists in the future (unless MH is ruled out). Discussed medical alert bracelet. Discussed future surgery options could include regional, neuraxial block, or general with trigger free induction. Will plan to have OR room set up for trigger free induction in case spinal is not effective or cannot be used.  George Hugh [facility] Short Stay Center/Anesthesiology Phone [phone] 07/04/2016 1:08 PM

## 2016-07-04 NOTE — Progress Notes (Signed)
Pt denies SOB, chest pain, and being under the care of a cardiologist. Pt denies having a cardiac cath.  Ebony Hail, PA, Anesthesia, advised that EKG be repeated. Pt stated that her PCP advised that she take Cymbalta every other day pre-operatively instead of daily ( as prescribed) to decrease chances of bleeding .Spoke with Ebony Hail, [place], Anesthesia, regarding pt history of Malignant Hyperthermia ( see note). Pt stated that she takes both Cymbalta and Depakote at night.

## 2016-07-04 NOTE — Progress Notes (Signed)
   07/04/16 1027  [place]  Have you ever been diagnosed with sleep apnea through a sleep study? No  Do you snore loudly (loud enough to be heard through closed doors)?  1  Do you often feel tired, fatigued, or sleepy during the daytime (such as falling asleep during driving or talking to someone)? 0  Has anyone observed you stop breathing during your sleep? 0  Do you have, or are you being treated for high blood pressure? 1  BMI more than 35 kg/m2? 1  Age > 50 (1-yes) 1  Neck circumference greater than:Female 16 inches or larger, Female 17inches or larger? 1  Female Gender (Yes=1) 0  Obstructive Sleep Apnea Score 5

## 2016-07-11 ENCOUNTER — Encounter (HOSPITAL_COMMUNITY): Payer: Self-pay

## 2016-07-14 MED ORDER — CEFAZOLIN SODIUM 10 G IJ SOLR
3.0000 g | INTRAMUSCULAR | Status: AC
  Administered 2016-07-15: 3 g via INTRAVENOUS
  Filled 2016-07-14 (×2): qty 3000

## 2016-07-15 ENCOUNTER — Encounter (HOSPITAL_COMMUNITY)
Admission: RE | Disposition: A | Discharge status: Skilled Nursing Facility | Payer: Self-pay | Source: Ambulatory Visit | Attending: Orthopedic Surgery

## 2016-07-15 ENCOUNTER — Inpatient Hospital Stay (HOSPITAL_COMMUNITY): Payer: Medicare HMO | Admitting: Anesthesiology

## 2016-07-15 ENCOUNTER — Inpatient Hospital Stay (HOSPITAL_COMMUNITY)
Admission: RE | Admit: 2016-07-15 | Discharge: 2016-07-18 | DRG: 470 | Disposition: A | Discharge status: Skilled Nursing Facility | Payer: Medicare HMO | Source: Ambulatory Visit | Attending: Orthopedic Surgery | Admitting: Orthopedic Surgery

## 2016-07-15 ENCOUNTER — Encounter (HOSPITAL_COMMUNITY): Payer: Self-pay | Admitting: Surgery

## 2016-07-15 ENCOUNTER — Inpatient Hospital Stay (HOSPITAL_COMMUNITY): Payer: Medicare HMO

## 2016-07-15 ENCOUNTER — Inpatient Hospital Stay (HOSPITAL_COMMUNITY): Payer: Medicare HMO | Admitting: Vascular Surgery

## 2016-07-15 DIAGNOSIS — Z8542 Personal history of malignant neoplasm of other parts of uterus: Secondary | ICD-10-CM | POA: Diagnosis not present

## 2016-07-15 DIAGNOSIS — Z79899 Other long term (current) drug therapy: Secondary | ICD-10-CM

## 2016-07-15 DIAGNOSIS — Z96619 Presence of unspecified artificial shoulder joint: Secondary | ICD-10-CM | POA: Diagnosis present

## 2016-07-15 DIAGNOSIS — F419 Anxiety disorder, unspecified: Secondary | ICD-10-CM | POA: Diagnosis present

## 2016-07-15 DIAGNOSIS — F319 Bipolar disorder, unspecified: Secondary | ICD-10-CM | POA: Diagnosis present

## 2016-07-15 DIAGNOSIS — I10 Essential (primary) hypertension: Secondary | ICD-10-CM | POA: Diagnosis present

## 2016-07-15 DIAGNOSIS — J449 Chronic obstructive pulmonary disease, unspecified: Secondary | ICD-10-CM | POA: Diagnosis present

## 2016-07-15 DIAGNOSIS — Z23 Encounter for immunization: Secondary | ICD-10-CM

## 2016-07-15 DIAGNOSIS — Z90710 Acquired absence of both cervix and uterus: Secondary | ICD-10-CM | POA: Diagnosis not present

## 2016-07-15 DIAGNOSIS — M25561 Pain in right knee: Secondary | ICD-10-CM

## 2016-07-15 DIAGNOSIS — M1711 Unilateral primary osteoarthritis, right knee: Secondary | ICD-10-CM | POA: Diagnosis present

## 2016-07-15 DIAGNOSIS — Z8249 Family history of ischemic heart disease and other diseases of the circulatory system: Secondary | ICD-10-CM | POA: Diagnosis not present

## 2016-07-15 DIAGNOSIS — M25661 Stiffness of right knee, not elsewhere classified: Secondary | ICD-10-CM

## 2016-07-15 DIAGNOSIS — Z7982 Long term (current) use of aspirin: Secondary | ICD-10-CM

## 2016-07-15 DIAGNOSIS — F17210 Nicotine dependence, cigarettes, uncomplicated: Secondary | ICD-10-CM | POA: Diagnosis present

## 2016-07-15 DIAGNOSIS — R262 Difficulty in walking, not elsewhere classified: Secondary | ICD-10-CM

## 2016-07-15 DIAGNOSIS — K219 Gastro-esophageal reflux disease without esophagitis: Secondary | ICD-10-CM | POA: Diagnosis present

## 2016-07-15 DIAGNOSIS — Z6841 Body Mass Index (BMI) 40.0 and over, adult: Secondary | ICD-10-CM

## 2016-07-15 DIAGNOSIS — Z8711 Personal history of peptic ulcer disease: Secondary | ICD-10-CM | POA: Diagnosis not present

## 2016-07-15 DIAGNOSIS — M797 Fibromyalgia: Secondary | ICD-10-CM | POA: Diagnosis present

## 2016-07-15 DIAGNOSIS — M47817 Spondylosis without myelopathy or radiculopathy, lumbosacral region: Secondary | ICD-10-CM

## 2016-07-15 DIAGNOSIS — Z96659 Presence of unspecified artificial knee joint: Secondary | ICD-10-CM

## 2016-07-15 DIAGNOSIS — R0602 Shortness of breath: Secondary | ICD-10-CM

## 2016-07-15 DIAGNOSIS — F3170 Bipolar disorder, currently in remission, most recent episode unspecified: Secondary | ICD-10-CM

## 2016-07-15 DIAGNOSIS — M4800 Spinal stenosis, site unspecified: Secondary | ICD-10-CM | POA: Diagnosis present

## 2016-07-15 DIAGNOSIS — M170 Bilateral primary osteoarthritis of knee: Secondary | ICD-10-CM

## 2016-07-15 HISTORY — DX: Unilateral primary osteoarthritis, right knee: M17.11

## 2016-07-15 HISTORY — PX: TOTAL KNEE ARTHROPLASTY: SHX125

## 2016-07-15 SURGERY — ARTHROPLASTY, KNEE, TOTAL
Anesthesia: Spinal | Site: Knee | Laterality: Right

## 2016-07-15 MED ORDER — NITROGLYCERIN 0.4 MG SL SUBL: 0.4000 mg | SUBLINGUAL_TABLET | SUBLINGUAL | Status: DC | PRN

## 2016-07-15 MED ORDER — 0.9 % SODIUM CHLORIDE (POUR BTL) OPTIME
TOPICAL | Status: DC | PRN
  Administered 2016-07-15: 1000 mL

## 2016-07-15 MED ORDER — SENNA 8.6 MG PO TABS
1.0000 | ORAL_TABLET | Freq: Two times a day (BID) | ORAL | Status: DC
  Administered 2016-07-15 – 2016-07-18 (×6): 8.6 mg via ORAL
  Filled 2016-07-15 (×6): qty 1

## 2016-07-15 MED ORDER — BUPIVACAINE HCL (PF) 0.25 % IJ SOLN
INTRAMUSCULAR | Status: AC
  Filled 2016-07-15: qty 30

## 2016-07-15 MED ORDER — MAGNESIUM CITRATE PO SOLN: 1.0000 | Freq: Once | ORAL | Status: DC | PRN

## 2016-07-15 MED ORDER — PROPOFOL 10 MG/ML IV BOLUS
INTRAVENOUS | Status: AC
  Filled 2016-07-15: qty 20

## 2016-07-15 MED ORDER — PHENYLEPHRINE HCL 10 MG/ML IJ SOLN
INTRAMUSCULAR | Status: DC | PRN
  Administered 2016-07-15: 40 ug via INTRAVENOUS
  Administered 2016-07-15: 80 ug via INTRAVENOUS

## 2016-07-15 MED ORDER — DULOXETINE HCL 60 MG PO CPEP
60.0000 mg | ORAL_CAPSULE | Freq: Every day | ORAL | Status: DC
  Administered 2016-07-15 – 2016-07-18 (×4): 60 mg via ORAL
  Filled 2016-07-15 (×4): qty 1

## 2016-07-15 MED ORDER — METHOCARBAMOL 500 MG PO TABS
500.0000 mg | ORAL_TABLET | Freq: Four times a day (QID) | ORAL | Status: DC | PRN
  Administered 2016-07-16: 500 mg via ORAL
  Filled 2016-07-15 (×2): qty 1

## 2016-07-15 MED ORDER — FENTANYL CITRATE (PF) 100 MCG/2ML IJ SOLN
INTRAMUSCULAR | Status: DC | PRN
  Administered 2016-07-15: 50 ug via INTRAVENOUS

## 2016-07-15 MED ORDER — ACETAMINOPHEN 650 MG RE SUPP: 650.0000 mg | Freq: Four times a day (QID) | RECTAL | Status: DC | PRN

## 2016-07-15 MED ORDER — RIVAROXABAN 10 MG PO TABS: 10.0000 mg | ORAL_TABLET | Freq: Every day | ORAL | 0 refills | Status: DC

## 2016-07-15 MED ORDER — ALUM & MAG HYDROXIDE-SIMETH 200-200-20 MG/5ML PO SUSP: 30.0000 mL | ORAL | Status: DC | PRN

## 2016-07-15 MED ORDER — RIVAROXABAN 10 MG PO TABS
10.0000 mg | ORAL_TABLET | Freq: Every day | ORAL | Status: DC
  Administered 2016-07-16 – 2016-07-18 (×3): 10 mg via ORAL
  Filled 2016-07-15 (×3): qty 1

## 2016-07-15 MED ORDER — DEXAMETHASONE SODIUM PHOSPHATE 10 MG/ML IJ SOLN
INTRAMUSCULAR | Status: AC
  Filled 2016-07-15: qty 1

## 2016-07-15 MED ORDER — ALBUTEROL SULFATE (2.5 MG/3ML) 0.083% IN NEBU: 3.0000 mL | INHALATION_SOLUTION | Freq: Four times a day (QID) | RESPIRATORY_TRACT | Status: DC | PRN

## 2016-07-15 MED ORDER — LIDOCAINE HCL (CARDIAC) 20 MG/ML IV SOLN
INTRAVENOUS | Status: DC | PRN
  Administered 2016-07-15: 30 mg via INTRAVENOUS

## 2016-07-15 MED ORDER — OXYCODONE HCL 15 MG PO TABS: 15.0000 mg | ORAL_TABLET | ORAL | 0 refills | Status: DC | PRN

## 2016-07-15 MED ORDER — PHENOL 1.4 % MT LIQD
1.0000 | OROMUCOSAL | Status: DC | PRN
Start: 2016-07-15 — End: 2016-07-18

## 2016-07-15 MED ORDER — ALPRAZOLAM 0.5 MG PO TABS
1.0000 mg | ORAL_TABLET | Freq: Three times a day (TID) | ORAL | Status: DC
  Administered 2016-07-15 – 2016-07-17 (×8): 1 mg via ORAL
  Filled 2016-07-15 (×8): qty 2

## 2016-07-15 MED ORDER — ONDANSETRON HCL 4 MG/2ML IJ SOLN
INTRAMUSCULAR | Status: DC | PRN
  Administered 2016-07-15: 4 mg via INTRAVENOUS

## 2016-07-15 MED ORDER — EPHEDRINE SULFATE 50 MG/ML IJ SOLN
INTRAMUSCULAR | Status: DC | PRN
  Administered 2016-07-15 (×3): 10 mg via INTRAVENOUS
  Administered 2016-07-15: 5 mg via INTRAVENOUS
  Administered 2016-07-15: 10 mg via INTRAVENOUS
  Administered 2016-07-15: 5 mg via INTRAVENOUS
  Administered 2016-07-15: 10 mg via INTRAVENOUS

## 2016-07-15 MED ORDER — KETOROLAC TROMETHAMINE 30 MG/ML IJ SOLN
INTRAMUSCULAR | Status: AC
  Filled 2016-07-15: qty 1

## 2016-07-15 MED ORDER — POLYETHYLENE GLYCOL 3350 17 G PO PACK: 17.0000 g | PACK | Freq: Every day | ORAL | Status: DC | PRN

## 2016-07-15 MED ORDER — HYDROMORPHONE HCL 1 MG/ML IJ SOLN
INTRAMUSCULAR | Status: AC
  Filled 2016-07-15: qty 1

## 2016-07-15 MED ORDER — HYDROMORPHONE HCL 1 MG/ML IJ SOLN
0.2500 mg | INTRAMUSCULAR | Status: DC | PRN
  Administered 2016-07-15: 0.5 mg via INTRAVENOUS

## 2016-07-15 MED ORDER — HYDROMORPHONE HCL 2 MG PO TABS: 2.0000 mg | ORAL_TABLET | ORAL | 0 refills | Status: DC | PRN

## 2016-07-15 MED ORDER — MIDAZOLAM HCL 5 MG/5ML IJ SOLN
INTRAMUSCULAR | Status: DC | PRN
  Administered 2016-07-15 (×2): 1 mg via INTRAVENOUS

## 2016-07-15 MED ORDER — METHOCARBAMOL 1000 MG/10ML IJ SOLN
500.0000 mg | Freq: Four times a day (QID) | INTRAVENOUS | Status: DC | PRN
  Filled 2016-07-15: qty 5

## 2016-07-15 MED ORDER — DIPHENHYDRAMINE HCL 12.5 MG/5ML PO ELIX: 12.5000 mg | ORAL_SOLUTION | ORAL | Status: DC | PRN

## 2016-07-15 MED ORDER — ONDANSETRON HCL 4 MG/2ML IJ SOLN: 4.0000 mg | Freq: Four times a day (QID) | INTRAMUSCULAR | Status: DC | PRN

## 2016-07-15 MED ORDER — POTASSIUM CHLORIDE IN NACL 20-0.45 MEQ/L-% IV SOLN
INTRAVENOUS | Status: DC
  Administered 2016-07-15: 14:00:00 via INTRAVENOUS
  Filled 2016-07-15 (×2): qty 1000

## 2016-07-15 MED ORDER — MELATONIN 1 MG PO TABS
1.0000 mg | ORAL_TABLET | Freq: Every day | ORAL | Status: DC
  Filled 2016-07-15: qty 1

## 2016-07-15 MED ORDER — MIDAZOLAM HCL 2 MG/2ML IJ SOLN
INTRAMUSCULAR | Status: AC
  Filled 2016-07-15: qty 2

## 2016-07-15 MED ORDER — FENTANYL CITRATE (PF) 100 MCG/2ML IJ SOLN
INTRAMUSCULAR | Status: AC
  Filled 2016-07-15: qty 2

## 2016-07-15 MED ORDER — METOCLOPRAMIDE HCL 5 MG PO TABS: 5.0000 mg | ORAL_TABLET | Freq: Three times a day (TID) | ORAL | Status: DC | PRN

## 2016-07-15 MED ORDER — PROPOFOL 500 MG/50ML IV EMUL
INTRAVENOUS | Status: DC | PRN
  Administered 2016-07-15: 50 ug/kg/min via INTRAVENOUS

## 2016-07-15 MED ORDER — CEFAZOLIN SODIUM-DEXTROSE 2-4 GM/100ML-% IV SOLN
2.0000 g | Freq: Four times a day (QID) | INTRAVENOUS | Status: AC
Start: 2016-07-15 — End: 2016-07-16
  Administered 2016-07-15: 2 g via INTRAVENOUS
  Filled 2016-07-15 (×2): qty 100

## 2016-07-15 MED ORDER — BACLOFEN 10 MG PO TABS: 10.0000 mg | ORAL_TABLET | Freq: Three times a day (TID) | ORAL | 0 refills | Status: DC

## 2016-07-15 MED ORDER — HYDROCHLOROTHIAZIDE 25 MG PO TABS
12.5000 mg | ORAL_TABLET | Freq: Every day | ORAL | Status: DC
  Administered 2016-07-15 – 2016-07-18 (×4): 12.5 mg via ORAL
  Filled 2016-07-15 (×3): qty 1

## 2016-07-15 MED ORDER — DIVALPROEX SODIUM ER 500 MG PO TB24
1000.0000 mg | ORAL_TABLET | Freq: Every evening | ORAL | Status: DC
  Administered 2016-07-15 – 2016-07-17 (×3): 1000 mg via ORAL
  Filled 2016-07-15 (×4): qty 2

## 2016-07-15 MED ORDER — ONDANSETRON HCL 4 MG/2ML IJ SOLN
INTRAMUSCULAR | Status: AC
  Filled 2016-07-15: qty 2

## 2016-07-15 MED ORDER — MENTHOL 3 MG MT LOZG: 1.0000 | LOZENGE | OROMUCOSAL | Status: DC | PRN

## 2016-07-15 MED ORDER — LIDOCAINE 2% (20 MG/ML) 5 ML SYRINGE
INTRAMUSCULAR | Status: AC
  Filled 2016-07-15: qty 5

## 2016-07-15 MED ORDER — ACETAMINOPHEN 325 MG PO TABS
650.0000 mg | ORAL_TABLET | Freq: Four times a day (QID) | ORAL | Status: DC | PRN
  Administered 2016-07-16 (×2): 650 mg via ORAL
  Filled 2016-07-15 (×3): qty 2

## 2016-07-15 MED ORDER — ONDANSETRON HCL 4 MG PO TABS: 4.0000 mg | ORAL_TABLET | Freq: Four times a day (QID) | ORAL | Status: DC | PRN

## 2016-07-15 MED ORDER — BUPIVACAINE LIPOSOME 1.3 % IJ SUSP
20.0000 mL | INTRAMUSCULAR | Status: DC
  Filled 2016-07-15: qty 20

## 2016-07-15 MED ORDER — SENNA-DOCUSATE SODIUM 8.6-50 MG PO TABS: 2.0000 | ORAL_TABLET | Freq: Every day | ORAL | 1 refills | Status: AC

## 2016-07-15 MED ORDER — GABAPENTIN 300 MG PO CAPS
300.0000 mg | ORAL_CAPSULE | Freq: Three times a day (TID) | ORAL | Status: DC
  Administered 2016-07-15 – 2016-07-18 (×10): 300 mg via ORAL
  Filled 2016-07-15 (×10): qty 1

## 2016-07-15 MED ORDER — FERROUS SULFATE 325 (65 FE) MG PO TABS
325.0000 mg | ORAL_TABLET | Freq: Two times a day (BID) | ORAL | Status: DC
  Administered 2016-07-15 – 2016-07-18 (×7): 325 mg via ORAL
  Filled 2016-07-15 (×6): qty 1

## 2016-07-15 MED ORDER — BUPIVACAINE HCL (PF) 0.25 % IJ SOLN
INTRAMUSCULAR | Status: DC | PRN
  Administered 2016-07-15: 15 mL

## 2016-07-15 MED ORDER — HYDROMORPHONE HCL 1 MG/ML IJ SOLN: 0.5000 mg | INTRAMUSCULAR | Status: DC | PRN

## 2016-07-15 MED ORDER — EPHEDRINE 5 MG/ML INJ
INTRAVENOUS | Status: AC
  Filled 2016-07-15: qty 10

## 2016-07-15 MED ORDER — CEFAZOLIN SODIUM-DEXTROSE 2-4 GM/100ML-% IV SOLN
2.0000 g | Freq: Four times a day (QID) | INTRAVENOUS | Status: AC
  Administered 2016-07-16: 2 g via INTRAVENOUS
  Filled 2016-07-15: qty 100

## 2016-07-15 MED ORDER — SODIUM CHLORIDE 0.9 % IR SOLN
Status: DC | PRN
  Administered 2016-07-15: 1000 mL

## 2016-07-15 MED ORDER — MELATONIN 3 MG PO TABS
3.0000 mg | ORAL_TABLET | Freq: Every day | ORAL | Status: DC
  Administered 2016-07-15 – 2016-07-17 (×3): 3 mg via ORAL
  Filled 2016-07-15 (×3): qty 1

## 2016-07-15 MED ORDER — LIDOCAINE 5 % EX PTCH
1.0000 | MEDICATED_PATCH | Freq: Every day | CUTANEOUS | Status: DC
  Administered 2016-07-16 – 2016-07-17 (×2): 1 via TRANSDERMAL
  Filled 2016-07-15 (×4): qty 1

## 2016-07-15 MED ORDER — OXYCODONE HCL 5 MG PO TABS
10.0000 mg | ORAL_TABLET | ORAL | Status: DC | PRN
  Administered 2016-07-15 – 2016-07-16 (×4): 15 mg via ORAL
  Filled 2016-07-15 (×4): qty 3

## 2016-07-15 MED ORDER — DOCUSATE SODIUM 100 MG PO CAPS
100.0000 mg | ORAL_CAPSULE | Freq: Two times a day (BID) | ORAL | Status: DC
  Administered 2016-07-15 – 2016-07-18 (×7): 100 mg via ORAL
  Filled 2016-07-15 (×7): qty 1

## 2016-07-15 MED ORDER — BISACODYL 10 MG RE SUPP: 10.0000 mg | Freq: Every day | RECTAL | Status: DC | PRN

## 2016-07-15 MED ORDER — LACTATED RINGERS IV SOLN
INTRAVENOUS | Status: DC | PRN
  Administered 2016-07-15 (×2): via INTRAVENOUS

## 2016-07-15 MED ORDER — METOCLOPRAMIDE HCL 5 MG/ML IJ SOLN: 5.0000 mg | Freq: Three times a day (TID) | INTRAMUSCULAR | Status: DC | PRN

## 2016-07-15 SURGICAL SUPPLY — 68 items
BANDAGE ACE 6X5 VEL STRL LF (GAUZE/BANDAGES/DRESSINGS) ×6 IMPLANT
BANDAGE ELASTIC 4 VELCRO ST LF (GAUZE/BANDAGES/DRESSINGS) ×2 IMPLANT
BANDAGE ELASTIC 6 VELCRO ST LF (GAUZE/BANDAGES/DRESSINGS) ×2 IMPLANT
BANDAGE ESMARK 6X9 LF (GAUZE/BANDAGES/DRESSINGS) ×1 IMPLANT
BLADE SAG 18X100X1.27 (BLADE) ×3 IMPLANT
BLADE SAW SGTL 13X75X1.27 (BLADE) ×3 IMPLANT
BNDG CMPR 9X6 STRL LF SNTH (GAUZE/BANDAGES/DRESSINGS) ×1
BNDG ESMARK 6X9 LF (GAUZE/BANDAGES/DRESSINGS) ×3
BOOTCOVER CLEANROOM LRG (PROTECTIVE WEAR) ×2 IMPLANT
BOWL SMART MIX CTS (DISPOSABLE) ×3 IMPLANT
CAPT KNEE TOTAL 3 ×2 IMPLANT
CEMENT HV SMART SET (Cement) ×6 IMPLANT
CLOSURE STERI-STRIP 1/2X4 (GAUZE/BANDAGES/DRESSINGS) ×2
CLSR STERI-STRIP ANTIMIC 1/2X4 (GAUZE/BANDAGES/DRESSINGS) ×3 IMPLANT
COVER SURGICAL LIGHT HANDLE (MISCELLANEOUS) ×3 IMPLANT
CUFF TOURNIQUET SINGLE 34IN LL (TOURNIQUET CUFF) ×1 IMPLANT
CUFF TOURNIQUET SINGLE 44IN (TOURNIQUET CUFF) ×2 IMPLANT
DRAPE EXTREMITY T 121X128X90 (DRAPE) ×3 IMPLANT
DRAPE U-SHAPE 47X51 STRL (DRAPES) ×3 IMPLANT
DRSG PAD ABDOMINAL 8X10 ST (GAUZE/BANDAGES/DRESSINGS) ×4 IMPLANT
DURAPREP 26ML APPLICATOR (WOUND CARE) ×5 IMPLANT
ELECT CAUTERY BLADE 6.4 (BLADE) ×3 IMPLANT
ELECT REM PT RETURN 9FT ADLT (ELECTROSURGICAL) ×3
ELECTRODE REM PT RTRN 9FT ADLT (ELECTROSURGICAL) ×1 IMPLANT
FACESHIELD STD STERILE (MASK) ×1 IMPLANT
GAUZE SPONGE 4X4 12PLY STRL (GAUZE/BANDAGES/DRESSINGS) ×3 IMPLANT
GLOVE BIOGEL PI IND STRL 8 (GLOVE) ×1 IMPLANT
GLOVE BIOGEL PI INDICATOR 8 (GLOVE) ×2
GLOVE BIOGEL PI ORTHO PRO SZ8 (GLOVE) ×2
GLOVE ORTHO TXT STRL SZ7.5 (GLOVE) ×3 IMPLANT
GLOVE PI ORTHO PRO STRL SZ8 (GLOVE) ×1 IMPLANT
GLOVE SURG ORTHO 8.0 STRL STRW (GLOVE) ×3 IMPLANT
GOWN STRL REUS W/ TWL XL LVL3 (GOWN DISPOSABLE) ×1 IMPLANT
GOWN STRL REUS W/TWL 2XL LVL3 (GOWN DISPOSABLE) ×3 IMPLANT
GOWN STRL REUS W/TWL XL LVL3 (GOWN DISPOSABLE) ×3
HANDPIECE INTERPULSE COAX TIP (DISPOSABLE) ×3
HOOD PEEL AWAY FACE SHEILD DIS (HOOD) ×6 IMPLANT
IMMOBILIZER KNEE 22 (SOFTGOODS) ×3 IMPLANT
KIT BASIN OR (CUSTOM PROCEDURE TRAY) ×3 IMPLANT
KIT ROOM TURNOVER OR (KITS) ×3 IMPLANT
MANIFOLD NEPTUNE II (INSTRUMENTS) ×3 IMPLANT
NDL 18GX1X1/2 (RX/OR ONLY) (NEEDLE) ×1 IMPLANT
NEEDLE 18GX1X1/2 (RX/OR ONLY) (NEEDLE) ×3 IMPLANT
NS IRRIG 1000ML POUR BTL (IV SOLUTION) ×3 IMPLANT
PACK TOTAL JOINT (CUSTOM PROCEDURE TRAY) ×3 IMPLANT
PAD ABD 8X10 STRL (GAUZE/BANDAGES/DRESSINGS) ×1 IMPLANT
PAD ARMBOARD 7.5X6 YLW CONV (MISCELLANEOUS) ×6 IMPLANT
PAD CAST 4YDX4 CTTN HI CHSV (CAST SUPPLIES) ×1 IMPLANT
PADDING CAST COTTON 4X4 STRL (CAST SUPPLIES) ×3
PADDING CAST COTTON 6X4 STRL (CAST SUPPLIES) ×3 IMPLANT
SET HNDPC FAN SPRY TIP SCT (DISPOSABLE) ×1 IMPLANT
SUCTION FRAZIER HANDLE 10FR (MISCELLANEOUS) ×2
SUCTION TUBE FRAZIER 10FR DISP (MISCELLANEOUS) ×1 IMPLANT
SUT MNCRL AB 4-0 PS2 18 (SUTURE) IMPLANT
SUT VIC AB 0 CT1 27 (SUTURE) ×6
SUT VIC AB 0 CT1 27XBRD ANBCTR (SUTURE) ×1 IMPLANT
SUT VIC AB 2-0 CT1 27 (SUTURE) ×6
SUT VIC AB 2-0 CT1 TAPERPNT 27 (SUTURE) ×1 IMPLANT
SUT VIC AB 3-0 SH 8-18 (SUTURE) ×4 IMPLANT
SYR 30ML LL (SYRINGE) IMPLANT
SYR 50ML LL SCALE MARK (SYRINGE) ×3 IMPLANT
TOWEL OR 17X24 6PK STRL BLUE (TOWEL DISPOSABLE) ×3 IMPLANT
TOWEL OR 17X26 10 PK STRL BLUE (TOWEL DISPOSABLE) ×3 IMPLANT
TRAY CATH 16FR W/PLASTIC CATH (SET/KITS/TRAYS/PACK) ×2 IMPLANT
TUBE CONNECTING 12'X1/4 (SUCTIONS) ×1
TUBE CONNECTING 12X1/4 (SUCTIONS) ×1 IMPLANT
WATER STERILE IRR 1000ML POUR (IV SOLUTION) ×2 IMPLANT
YANKAUER SUCT BULB TIP NO VENT (SUCTIONS) ×2 IMPLANT

## 2016-07-15 NOTE — Anesthesia Procedure Notes (Signed)
Procedure Name: MAC Date/Time: 07/15/2016 7:30 AM Performed by: Indy Prestwood D Pre-anesthesia Checklist: Patient identified, Emergency Drugs available, Suction available, Timeout performed and Patient being monitored Patient Re-evaluated:Patient Re-evaluated prior to inductionOxygen Delivery Method: Simple face mask

## 2016-07-15 NOTE — Anesthesia Procedure Notes (Signed)
Spinal  Patient location during procedure: OR Staffing Anesthesiologist: Alania Overholt Performed: anesthesiologist  Preanesthetic Checklist Completed: patient identified, site marked, surgical consent, pre-op evaluation, timeout performed, IV checked, risks and benefits discussed and monitors and equipment checked Spinal Block Patient position: sitting Prep: Betadine Patient monitoring: heart rate, continuous pulse ox and blood pressure Injection technique: single-shot Needle Needle type: Spinocan  Needle gauge: 22 G Needle length: 9 cm Additional Notes Expiration date of kit checked and confirmed. Patient tolerated procedure well, without complications.

## 2016-07-15 NOTE — Transfer of Care (Signed)
Immediate Anesthesia Transfer of Care Note  Patient: [name]  Procedure(s) Performed: Procedure(s): TOTAL KNEE ARTHROPLASTY (Right)  Patient Location: PACU  Anesthesia Type:MAC combined with regional for post-op pain  Level of Consciousness: awake, alert , oriented and patient cooperative  Airway & Oxygen Therapy: Patient Spontanous Breathing and Patient connected to face mask oxygen  Post-op Assessment: Report given to RN and Post -op Vital signs reviewed and stable  Post vital signs: Reviewed and stable  Last Vitals:  Vitals:   07/15/16 0610  BP: (!) 96/58  Pulse: 93  Resp: 18  Temp: 36.6 C    Last Pain:  Vitals:   07/15/16 0610  TempSrc: Oral  PainSc: 9          Complications: No apparent anesthesia complications

## 2016-07-15 NOTE — Op Note (Signed)
DATE OF SURGERY:  07/15/2016 TIME: 9:55 AM  PATIENT NAME:  [name]   AGE: 50 y.o.    PRE-OPERATIVE DIAGNOSIS:  OA RIGHT KNEE  POST-OPERATIVE DIAGNOSIS:  Same  PROCEDURE:  Procedure(s): TOTAL KNEE ARTHROPLASTY   SURGEON:  Johnny Bridge, MD   ASSISTANT:  Joya Gaskins, OPA-C, present and scrubbed throughout the case, critical for assistance with exposure, retraction, instrumentation, and closure.   OPERATIVE IMPLANTS: Biomet Vanguard size 70 femur, posterior stabilized, size 75 mm tibial plate, with a 10 mm polyethylene bearing, vitamin E, with a size 37 mm patellar button and 2 distal femoral pegs with a single interlocking bar for the tibial tray.  Estimated blood loss: 100 mL   PREOPERATIVE INDICATIONS:  [name] is a 50 y.o. year old female with end stage bone on bone degenerative arthritis of the knee who failed conservative treatment, including injections, antiinflammatories, activity modification, and assistive devices, and had significant impairment of their activities of daily living, and elected for Total Knee Arthroplasty.   The risks, benefits, and alternatives were discussed at length including but not limited to the risks of infection, bleeding, nerve injury, stiffness, blood clots, the need for revision surgery, cardiopulmonary complications, among others, and they were willing to proceed.  OPERATIVE FINDINGS AND UNIQUE ASPECTS OF THE CASE:  The tourniquet did in fact work for this operation, compared to the contralateral side. She had morbid obesity which did increase the complexity and risk of the operation was a BMI of 52. The patella was not able to be everted. There was substantial degenerative change in the patellofemoral joint as well as laterally, and end-stage changes medially, with osteophyte formation throughout the knee.  OPERATIVE DESCRIPTION:  The patient was brought to the operative room and placed in a supine position.  Spinal anesthesia  was administered.  IV antibiotics were given.  The lower extremity was prepped and draped in the usual sterile fashion.  Time out was performed.  The leg was elevated and exsanguinated and the tourniquet was inflated.  Anterior quadriceps tendon splitting approach was performed.  The patella was everted and osteophytes were removed.  The anterior horn of the medial and lateral meniscus was removed.   The distal femur was opened with the drill and the intramedullary distal femoral cutting jig was utilized, set at 5 degrees resecting 10 mm off the distal femur.  Care was taken to protect the collateral ligaments.  Then the extramedullary tibial cutting jig was utilized making the appropriate cut using the anterior tibial crest as a reference building in appropriate posterior slope.  Care was taken during the cut to protect the medial and collateral ligaments.  The proximal tibia was removed along with the posterior horns of the menisci.  The PCL was sacrificed.    The extensor gap was measured and was approximately 22mm.    The distal femoral sizing jig was applied, taking care to avoid notching.  Then the 4-in-1 cutting jig was applied and the anterior and posterior femur was cut, along with the chamfer cuts.  All posterior osteophytes were removed.  The flexion gap was then measured and was symmetric with the extension gap.  I completed the distal femoral preparation using the appropriate jig to prepare the box.  The patella was then measured, and cut with the saw.  The thickness before the cut was 21 and after the cut was 13.  The proximal tibia sized and prepared accordingly with the reamer and the punch, and then  all components were trialed with the 2mm poly insert.  The knee was found to have excellent balance and full motion.    The above named components were then cemented into place and all excess cement was removed.  The real polyethylene implant was placed.  After the cement had cured I  released the tourniquet and confirmed excellent hemostasis with no major posterior vessel injury.    The knee was easily taken through a range of motion and the patella tracked well and the knee irrigated copiously and the parapatellar and subcutaneous tissue closed with vicryl, and monocryl with steri strips for the skin.  The wounds were injected with marcaine, and dressed with sterile gauze and the patient was awakened and returned to the PACU in stable and satisfactory condition.  There were no complications.  Total tourniquet time was 110 minutes.

## 2016-07-15 NOTE — H&P (Signed)
PREOPERATIVE H&P  Chief Complaint: OA RIGHT KNEE  HPI: [name] is a 50 y.o. female who presents for preoperative history and physical with a diagnosis of OA RIGHT KNEE. Symptoms are rated as moderate to severe, and have been worsening.  This is significantly impairing activities of daily living.  She has elected for surgical management.   She has failed injections, activity modification, anti-inflammatories, and assistive devices.  Preoperative X-rays demonstrate end stage degenerative changes with osteophyte formation, loss of joint space, subchondral sclerosis.  She had the other side done and is very happy with the results.   Past Medical History:  Diagnosis Date  . Anemia   . Anxiety   . Bipolar 1 disorder ([place])   . Cancer ([place])    uterine CA/- resulted in hysterectomy, pt. reports that she is cured   . COPD (chronic obstructive pulmonary disease) ([place])   . Depression   . Fibromyalgia   . GERD (gastroesophageal reflux disease)   . Headache    migraine's in 20's  . Hypertension   . Insomnia   . Obesity   . Osteoarthritis    knees, elbows   . 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 Pneumonia    developed in 07/2015- post shoulder replacement   . 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 Primary localized osteoarthritis of left knee 03/04/2016  . PUD (peptic ulcer disease)   . Spinal stenosis    Past Surgical History:  Procedure Laterality Date  . ABDOMINAL HYSTERECTOMY  1998  . CARDIOVASCULAR STRESS TEST  03/2010   Lexiscan Myoview: EF 55%, mild breast attenuation, no ischemia or scar  . DILATION AND CURETTAGE OF UTERUS  1986   "lost a son"  . esophageal ulcer    . JOINT REPLACEMENT    . KNEE ARTHROSCOPY Right 2005  . TONSILLECTOMY    . TOTAL KNEE ARTHROPLASTY Left 03/04/2016   Procedure: TOTAL KNEE ARTHROPLASTY;  Surgeon: Marchia Bond, MD;  Location: [place];  Service: Orthopedics;  Laterality: Left;  . TOTAL SHOULDER ARTHROPLASTY  Left 07/17/2015   Procedure: TOTAL SHOULDER ARTHROPLASTY;  Surgeon: Marchia Bond, MD;  Location: [place];  Service: Orthopedics;  Laterality: Left;  . TRANSTHORACIC ECHOCARDIOGRAM  03/2010   EF 50-55%, normal LV size, no WMAs  . TUBAL LIGATION  1990   Social History   Social History  . Marital status: Divorced    Spouse name: N/A  . Number of children: N/A  . Years of education: N/A   Social History Main Topics  . Smoking status: Current Every Day Smoker    Packs/day: 1.00    Years: 36.00    Types: Cigarettes  . Smokeless tobacco: Never Used  . Alcohol use 0.6 oz/week    1 Shots of liquor per week     Comment: occasional  . Drug use: No     Comment: goes to pain clinic  . Sexual activity: Not Asked   Other Topics Concern  . None   Social History Narrative  . None   Family History  Problem Relation Age of Onset  . Schizophrenia Mother   . Hypertension Mother   . Alzheimer's disease Mother   . Stroke Father   . Diabetes Other   . Hypertension Other    Allergies  Allergen Reactions  . Abilify [Aripiprazole] Other (See Comments)    Causes body shaking and tremors  . Latuda [Lurasidone Hcl] Other (See Comments) and Nausea And Vomiting    Causes body  shaking and tremors EPS. She is intolerant to all AAP in this way Causes body shaking and tremors  . Naproxen Anaphylaxis  . Wellbutrin [Bupropion] Other (See Comments)    Shakes and jerking EPS    Prior to Admission medications   Medication Sig Start Date End Date Taking? Authorizing Provider  albuterol (PROAIR HFA) 108 (90 Base) MCG/ACT inhaler Inhale 2 puffs into the lungs every 6 (six) hours as needed for wheezing or shortness of breath.  10/04/15 10/03/16 Yes Historical Provider, MD  ALPRAZolam Duanne Moron) 1 MG tablet Take 1 tablet (1 mg total) by mouth 3 (three) times daily. 03/13/16  Yes Tiffany L Reed, DO  aspirin 81 MG chewable tablet Chew 81 mg by mouth at bedtime.    Yes Historical Provider, MD  baclofen  (LIORESAL) 10 MG tablet TAKE 1 TABLET BY MOUTH THREE TIMES DAILY 03/18/16  Yes Meredith Staggers, MD  divalproex (DEPAKOTE ER) 500 MG 24 hr tablet Take 1,000 mg by mouth every evening.    Yes Historical Provider, MD  DULoxetine (CYMBALTA) 60 MG capsule Take 60 mg by mouth daily.   Yes Historical Provider, MD  ferrous sulfate 325 (65 FE) MG tablet Take 325 mg by mouth 2 (two) times daily with a meal.   Yes Historical Provider, MD  hydrochlorothiazide (HYDRODIURIL) 12.5 MG tablet Take 12.5 mg by mouth daily.  06/19/15  Yes Historical Provider, MD  lidocaine (LIDODERM) 5 % Place 1 patch onto the skin every 12 (twelve) hours. Remove & Discard patch within 12 hours or as directed 10/29/15  Yes Bayard Hugger, NP  Melatonin 1 MG TABS Take 1 mg by mouth at bedtime.   Yes Historical Provider, MD  oxyCODONE (ROXICODONE) 15 MG immediate release tablet Take 1 tablet (15 mg total) by mouth every 8 (eight) hours as needed for pain. 06/23/16  Yes Meredith Staggers, MD  amitriptyline (ELAVIL) 50 MG tablet TAKE 1 TABLET BY MOUTH EVERY NIGHT AT BEDTIME Patient not taking: Reported on 07/02/2016 02/25/16   Bayard Hugger, NP  ibuprofen (ADVIL,MOTRIN) 800 MG tablet Take 1 tablet (800 mg total) by mouth 3 (three) times daily. Patient taking differently: Take 800 mg by mouth every 8 (eight) hours as needed for moderate pain.  06/17/16   Margarita Mail, PA-C  nitroGLYCERIN (NITROSTAT) 0.4 MG SL tablet Place 0.4 mg under the tongue every 5 (five) minutes as needed for chest pain.  06/11/15   Historical Provider, MD  polyethylene glycol (MIRALAX / GLYCOLAX) packet Take 17 g by mouth daily as needed for mild constipation.    Historical Provider, MD  rivaroxaban (XARELTO) 10 MG TABS tablet Take 1 tablet (10 mg total) by mouth daily. Patient not taking: Reported on 07/02/2016 03/04/16   Marchia Bond, MD  sennosides-docusate sodium (SENOKOT-S) 8.6-50 MG tablet Take 2 tablets by mouth daily. Patient not taking: Reported on 07/02/2016  03/04/16   Marchia Bond, MD     Positive ROS: All other systems have been reviewed and were otherwise negative with the exception of those mentioned in the HPI and as above.  Physical Exam: General: Alert, no acute distress Cardiovascular: No pedal edema Respiratory: No cyanosis, no use of accessory musculature GI: No organomegaly, abdomen is soft and non-tender Skin: No lesions in the area of chief complaint Neurologic: Sensation intact distally Psychiatric: Patient is competent for consent with normal mood and affect Lymphatic: No axillary or cervical lymphadenopathy  MUSCULOSKELETAL: right knee with painful crepitance and ROM 0-110, stable to ligamentous exam.  Assessment: OA RIGHT KNEE   Plan: Plan for Procedure(s): TOTAL KNEE ARTHROPLASTY  The risks benefits and alternatives were discussed with the patient including but not limited to the risks of nonoperative treatment, versus surgical intervention including infection, bleeding, nerve injury,  blood clots, cardiopulmonary complications, morbidity, mortality, among others, and they were willing to proceed.   Johnny Bridge, MD Cell [phone]   07/15/2016 7:22 AM

## 2016-07-15 NOTE — Anesthesia Postprocedure Evaluation (Signed)
Anesthesia Post Note  Patient: [name]  Procedure(s) Performed: Procedure(s) (LRB): TOTAL KNEE ARTHROPLASTY (Right)  Patient location during evaluation: PACU Anesthesia Type: Spinal Level of consciousness: oriented and awake and alert Pain management: pain level controlled Vital Signs Assessment: post-procedure vital signs reviewed and stable Respiratory status: spontaneous breathing, respiratory function stable and patient connected to nasal cannula oxygen Cardiovascular status: blood pressure returned to baseline and stable Postop Assessment: no headache and no backache Anesthetic complications: no    Last Vitals:  Vitals:   07/15/16 1115 07/15/16 1130  BP: (!) 95/58   Pulse: 86 87  Resp: 16 17  Temp:      Last Pain:  Vitals:   07/15/16 1130  TempSrc:   PainSc: 10-Worst pain ever                 Bonner Larue S

## 2016-07-15 NOTE — Anesthesia Preprocedure Evaluation (Signed)
Anesthesia Evaluation  Patient identified by MRN, date of birth, ID band Patient awake    Reviewed: Allergy & Precautions, NPO status , Patient's Chart, lab work & pertinent test results  Airway Mallampati: II  TM Distance: >3 FB Neck ROM: Full    Dental no notable dental hx.    Pulmonary COPD, Current Smoker,    Pulmonary exam normal breath sounds clear to auscultation       Cardiovascular hypertension, Normal cardiovascular exam Rhythm:Regular Rate:Normal     Neuro/Psych negative neurological ROS  negative psych ROS   GI/Hepatic Neg liver ROS, GERD  ,  Endo/Other  Morbid obesity  Renal/GU negative Renal ROS  negative genitourinary   Musculoskeletal negative musculoskeletal ROS (+)   Abdominal   Peds negative pediatric ROS (+)  Hematology negative hematology ROS (+)   Anesthesia Other Findings   Reproductive/Obstetrics negative OB ROS                             Anesthesia Physical Anesthesia Plan  ASA: III  Anesthesia Plan: Spinal   Post-op Pain Management:    Induction: Intravenous  Airway Management Planned: Simple Face Mask  Additional Equipment:   Intra-op Plan:   Post-operative Plan:   Informed Consent: I have reviewed the patients History and Physical, chart, labs and discussed the procedure including the risks, benefits and alternatives for the proposed anesthesia with the patient or authorized representative who has indicated his/her understanding and acceptance.   Dental advisory given  Plan Discussed with: CRNA and Surgeon  Anesthesia Plan Comments: (Possible MH variant)        Anesthesia Quick Evaluation

## 2016-07-15 NOTE — Evaluation (Addendum)
Physical Therapy Evaluation Patient Details Name: TOCARA MUSSER MRN: [id number] DOB: February 07, 1966 Today's Date: 07/15/2016   History of Present Illness  50 y.o. female admitted to [facility] on 07/15/16 for R TKA.  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  Clinical Impression  Pt is POD #0 and is limited by pain and lethargy, but able to make it over to the recliner chair with min assist and RW for support.  She lives alone and is seeking SNF for rehab at discharge so she can be as independent and safe as possible before returning home alone.  PT to follow acutely for deficits listed below.       Follow Up Recommendations SNF for rehab    Equipment Recommendations  None recommended by PT    Recommendations for Other Services   NA    Precautions / Restrictions Precautions Precautions: Knee Precaution Booklet Issued: Yes (comment) Precaution Comments: knee exercise handout given and reviewed Required Braces or Orthoses: Knee Immobilizer - Right Knee Immobilizer - Right: Other (comment) (no orders for one, but used tonight for stability) Restrictions Weight Bearing Restrictions: Yes RLE Weight Bearing: Weight bearing as tolerated      Mobility  Bed Mobility Overal bed mobility: Needs Assistance Bed Mobility: Supine to Sit     Supine to sit: Min assist;HOB elevated     General bed mobility comments: Min assist to help progress right leg over the EOB  Transfers Overall transfer level: Needs assistance Equipment used: Rolling walker (2 wheeled) Transfers: Sit to/from Stand Sit to Stand: Min assist         General transfer comment: Min assist to support trunk during transitions, verbal cues for safe hand placement.   Ambulation/Gait Ambulation/Gait assistance: Min assist Ambulation Distance (Feet): 8 Feet Assistive device: Rolling walker (2 wheeled) Gait  Pattern/deviations: Step-to pattern;Antalgic;Trunk flexed     General Gait Details: Pt with moderately antalgic gait pattern.  She requested to wear KI this evening due to feeling like her knee was shakey earlier when up with RN tech.           Balance Overall balance assessment: Needs assistance Sitting-balance support: Feet supported;No upper extremity supported Sitting balance-Leahy Scale: Good     Standing balance support: Bilateral upper extremity supported Standing balance-Leahy Scale: Fair                               Pertinent Vitals/Pain Pain Assessment: 0-10 Pain Score: 7  Pain Location: right knee Pain Descriptors / Indicators: Grimacing;Guarding Pain Intervention(s): Monitored during session;Limited activity within patient's tolerance;Premedicated before session;Repositioned    Home Living Family/patient expects to be discharged to:: Skilled nursing facility Living Arrangements: Alone Available Help at Discharge: Friend(s);Available PRN/intermittently Type of Home: House Home Access: Stairs to enter Entrance Stairs-Rails: Psychiatric nurse of Steps: 1 Home Layout: One level Home Equipment: Walker - 2 wheels;Bedside commode;Shower seat      Prior Function Level of Independence: Independent         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 Ankle at  least 3/5, knee at least 2/5, hip 2+/5    Cervical / Trunk Assessment: Other exceptions  Communication   Communication: No difficulties  Cognition Arousal/Alertness: Lethargic;Suspect due to medications Behavior During Therapy: [facility] for tasks assessed/performed Overall Cognitive Status: Within Functional Limits for tasks  assessed                         Exercises Total Joint Exercises Ankle Circles/Pumps: AROM;Both;20 reps   Assessment/Plan    PT Assessment Patient needs continued PT services  PT Problem List Decreased strength;Decreased range of motion;Decreased activity tolerance;Decreased balance;Decreased mobility;Decreased knowledge of use of DME;Decreased knowledge of precautions;Obesity;Pain          PT Treatment Interventions Gait training;DME instruction;Stair training;Functional mobility training;Therapeutic activities;Therapeutic exercise;Balance training;Neuromuscular re-education;Patient/family education;Manual techniques;Modalities    PT Goals (Current goals can be found in the Care Plan section)  Acute Rehab PT Goals Patient Stated Goal: to go to rehab before going home alone PT Goal Formulation: With patient Time For Goal Achievement: 07/22/16 Potential to Achieve Goals: Good    Frequency 7X/week           End of Session Equipment Utilized During Treatment: Right knee immobilizer Activity Tolerance: Patient limited by fatigue;Patient limited by pain Patient left: in chair;with call bell/phone within reach;with family/visitor present           Time: 1536-1601 PT Time Calculation (min) (ACUTE ONLY): 25 min   Charges:   PT Evaluation $PT Eval Moderate Complexity: 1 Procedure PT Treatments $Therapeutic Activity: 8-22 mins        Brennen Camper B. [place], [place], DPT [phone]   07/15/2016, 4:45 PM

## 2016-07-16 ENCOUNTER — Encounter (HOSPITAL_COMMUNITY): Payer: Self-pay | Admitting: Orthopedic Surgery

## 2016-07-16 LAB — BASIC METABOLIC PANEL
Anion gap: 9 (ref 5–15)
BUN: 9 mg/dL (ref 6–20)
CHLORIDE: 98 mmol/L — AB (ref 101–111)
CO2: 26 mmol/L (ref 22–32)
CREATININE: 0.81 mg/dL (ref 0.44–1.00)
Calcium: 8.5 mg/dL — ABNORMAL LOW (ref 8.9–10.3)
GFR calc Af Amer: >60 mL/min (ref >60)
GFR calc non Af Amer: >60 mL/min (ref >60)
GLUCOSE: 151 mg/dL — AB (ref 65–99)
POTASSIUM: 4.6 mmol/L (ref 3.5–5.1)
SODIUM: 133 mmol/L — AB (ref 135–145)

## 2016-07-16 LAB — CBC
HCT: 34 % — ABNORMAL LOW (ref 36.0–46.0)
HEMOGLOBIN: 10.7 g/dL — AB (ref 12.0–15.0)
MCH: 26.2 pg (ref 26.0–34.0)
MCHC: 31.5 g/dL (ref 30.0–36.0)
MCV: 83.1 fL (ref 78.0–100.0)
PLATELETS: 269 10*3/uL (ref 150–400)
RBC: 4.09 MIL/uL (ref 3.87–5.11)
RDW: 16.1 % — ABNORMAL HIGH (ref 11.5–15.5)
WBC: 8.4 10*3/uL (ref 4.0–10.5)

## 2016-07-16 MED ORDER — INFLUENZA VAC SPLIT QUAD 0.5 ML IM SUSY
0.5000 mL | PREFILLED_SYRINGE | INTRAMUSCULAR | Status: AC
  Administered 2016-07-17: 0.5 mL via INTRAMUSCULAR
  Filled 2016-07-16: qty 0.5

## 2016-07-16 MED ORDER — OXYCODONE HCL 5 MG PO TABS
10.0000 mg | ORAL_TABLET | ORAL | Status: DC | PRN
  Administered 2016-07-16: 10 mg via ORAL
  Administered 2016-07-17 – 2016-07-18 (×2): 15 mg via ORAL
  Filled 2016-07-16 (×3): qty 2
  Filled 2016-07-16 (×2): qty 3

## 2016-07-16 NOTE — Progress Notes (Addendum)
Physical Therapy Treatment Patient Details Name: [name] MRN: [id number] DOB: [birth year] Today's Date: 07/16/2016    History of Present Illness 50 y.o. female admitted to [facility] on 07/15/16 for R TKA.  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    PT Comments    Pt is POD #1 and this is her second session.  She was able to progress gait a short distance into the hallway.  She continues to be very lethargic, but responsive to pain and participatory during our session.  Pt is still appropriate for SNF placement as she is not yet physically ready to go home at this time.  PT to follow acutely until d/c confirmed.        Follow Up Recommendations  SNF;Other (comment) (requesting [facility] in [place])     Equipment Recommendations  None recommended by PT    Recommendations for Other Services   NA     Precautions / Restrictions Precautions Precautions: Knee Precaution Booklet Issued: Yes (comment) (previously provided by PT) Precaution Comments: knee exercise handout given and reviewed Required Braces or Orthoses: Knee Immobilizer - Right Knee Immobilizer - Right: Other (comment) (no orders for, but used today--pt's preference) Restrictions Weight Bearing Restrictions: Yes RLE Weight Bearing: Weight bearing as tolerated    Mobility  Bed Mobility Overal bed mobility: Needs Assistance Bed Mobility: Supine to Sit     Supine to sit: Min assist;HOB elevated     General bed mobility comments: Min assist to help progress right leg to EOB.  HOB only minimally elevated, pt using bedrail for leverage  Transfers Overall transfer level: Needs assistance Equipment used: Rolling walker (2 wheeled) Transfers: Sit to/from Stand Sit to Stand: Min assist         General transfer comment: Min assist to help support trunk during transitions, verbal cues for safe  hand placement.   Ambulation/Gait Ambulation/Gait assistance: Min assist Ambulation Distance (Feet): 30 Feet Assistive device: Rolling walker (2 wheeled) Gait Pattern/deviations: Step-to pattern;Antalgic;Trunk flexed Gait velocity: decreased Gait velocity interpretation: Below normal speed for age/gender General Gait Details: Pt with moderately antalgic gait pattern, flexed truk posture, less cues to stay inside of RW, but continued cues for upright posutre.  As she fatigues she likes to rest on her elbows on the RW handles.           Balance Overall balance assessment: Needs assistance Sitting-balance support: Feet supported;No upper extremity supported Sitting balance-Leahy Scale: Good     Standing balance support: Bilateral upper extremity supported Standing balance-Leahy Scale: Poor                      Cognition Arousal/Alertness: Lethargic;Suspect due to medications Behavior During Therapy: [facility] for tasks assessed/performed Overall Cognitive Status: Within Functional Limits for tasks assessed                      Exercises Total Joint Exercises Ankle Circles/Pumps: AAROM;Both;20 reps Heel Slides: AAROM;Right;10 reps Hip ABduction/ADduction: AAROM;Right;10 reps Straight Leg Raises: AAROM;Right;10 reps        Pertinent Vitals/Pain Pain Assessment: Faces Pain Score: 7  Faces Pain Scale: Hurts even more Pain Location: right knee Pain Descriptors / Indicators: Aching;Grimacing;Guarding Pain Intervention(s): Monitored during session;Limited activity within patient's tolerance;Repositioned;Patient requesting pain meds-RN notified           PT Goals (current goals can now be  found in the care plan section) Acute Rehab PT Goals Patient Stated Goal: to go to rehab before going home alone Progress towards PT goals: Progressing toward goals    Frequency    7X/week      PT Plan Current plan remains appropriate    Co-evaluation PT/OT/SLP  Co-Evaluation/Treatment: Yes Reason for Co-Treatment: For patient/therapist safety;Other (comment) (because pt did not want to get up another time this PM) PT goals addressed during session: Mobility/safety with mobility;Balance;Proper use of DME;Strengthening/ROM OT goals addressed during session: ADL's and self-care     End of Session Equipment Utilized During Treatment: Gait belt;Right knee immobilizer Activity Tolerance: Patient limited by lethargy;Patient limited by pain Patient left: in bed;with call bell/phone within reach;with bed alarm set     Time: [id number] PT Time Calculation (min) (ACUTE ONLY): 31 min  Charges:  $Gait Training: 8-22 mins $Therapeutic Exercise: 8-22 mins            Stein Windhorst B. Jerek Meulemans, PT, DPT [phone]   07/16/2016, 3:19 PM

## 2016-07-16 NOTE — Progress Notes (Signed)
Physical Therapy Treatment Patient Details Name: [name] MRN: [id number] DOB: [birth year] Today's Date: 07/16/2016    History of Present Illness 50 y.o. female admitted to [facility] on 07/15/16 for R TKA.  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    PT Comments    Pt eyes closed much of session today, reporting being hot and sweaty.  PT will return this PM to try to progress gait into the hallway, but would feel safer with chair to follow for safety.  Exercises initiated.  Pt continues to be appropriate for SNF at discharge.  Family interested in [facility] in [place]  Follow Up Recommendations  SNF ([facility] in [place] Family's preference)     Equipment Recommendations  None recommended by PT    Recommendations for Other Services   NA     Precautions / Restrictions Precautions Precautions: Knee Precaution Booklet Issued: Yes (comment) Precaution Comments: knee exercise handout given and reviewed Required Braces or Orthoses: Knee Immobilizer - Right Knee Immobilizer - Right: Other (comment) (no orders for, but used today--pt's preference) Restrictions RLE Weight Bearing: Weight bearing as tolerated    Mobility  Bed Mobility Overal bed mobility: Needs Assistance Bed Mobility: Supine to Sit     Supine to sit: Min assist;HOB elevated     General bed mobility comments: Min assist to help progress right leg over EOB.  HOB elevated and pt using railing to pull to sitting.   Transfers Overall transfer level: Needs assistance Equipment used: Rolling walker (2 wheeled) Transfers: Sit to/from Stand Sit to Stand: Min assist         General transfer comment: Min assist to support trunk during transitions.  Verbal cues for safe hand placement.   Ambulation/Gait Ambulation/Gait assistance: Min assist Ambulation Distance (Feet): 20 Feet Assistive  device: Rolling walker (2 wheeled) Gait Pattern/deviations: Step-to pattern;Antalgic;Trunk flexed Gait velocity: decreased   General Gait Details: Pt with significantly flexed trunk and moderately antalgic gait pattern.  Verbal cues for safe RW use and to open eyes during gait.           Balance Overall balance assessment: Needs assistance Sitting-balance support: Feet supported;Bilateral upper extremity supported Sitting balance-Leahy Scale: Good     Standing balance support: Bilateral upper extremity supported Standing balance-Leahy Scale: Poor                      Cognition Arousal/Alertness: Lethargic;Suspect due to medications Behavior During Therapy: [facility] for tasks assessed/performed Overall Cognitive Status: Within Functional Limits for tasks assessed                      Exercises Total Joint Exercises Ankle Circles/Pumps: AAROM;Both;20 reps Heel Slides: AAROM;Right;10 reps Hip ABduction/ADduction: AAROM;Right;10 reps Straight Leg Raises: AAROM;Right;10 reps        Pertinent Vitals/Pain Pain Assessment: 0-10 Pain Score: 7  Pain Location: right knee Pain Descriptors / Indicators: Aching;Burning Pain Intervention(s): Limited activity within patient's tolerance;Monitored during session;Repositioned           PT Goals (current goals can now be found in the care plan section) Acute Rehab PT Goals Patient Stated Goal: to go to rehab before going home alone Progress towards PT goals: Progressing toward goals    Frequency    7X/week      PT Plan Current plan remains appropriate  End of Session Equipment Utilized During Treatment: Gait belt;Right knee immobilizer Activity Tolerance: Patient limited by lethargy;Patient limited by pain Patient left: in chair;with call bell/phone within reach;with family/visitor present     Time: [id number] PT Time Calculation (min) (ACUTE ONLY): 32 min  Charges:  $Gait Training: 8-22  mins $Therapeutic Exercise: 8-22 mins                      Eunice Oldaker B. Francy Mcilvaine, PT, DPT [phone]   07/16/2016, 11:28 AM

## 2016-07-16 NOTE — H&P (Signed)
Around 0200, patient started to spike a temperature of 102.9, Tylenol 650 mg PO was given and an hour later her temp was 102.  On call was notified and no new orders given at that time.  Instructed to provide Tylenol PO if needed when it was due.  Patient's temp has come down to 100.1 at 0356 this morning.  Patient was reminded to use her incentive spirometer and drink fluids.

## 2016-07-16 NOTE — Discharge Instructions (Signed)
INSTRUCTIONS AFTER JOINT REPLACEMENT  ° °o Remove items at home which could result in a fall. This includes throw rugs or furniture in walking pathways °o ICE to the affected joint every three hours while awake for 30 minutes at a time, for at least the first 3-5 days, and then as needed for pain and swelling.  Continue to use ice for pain and swelling. You may notice swelling that will progress down to the foot and ankle.  This is normal after surgery.  Elevate your leg when you are not up walking on it.   °o Continue to use the breathing machine you got in the hospital (incentive spirometer) which will help keep your temperature down.  It is common for your temperature to cycle up and down following surgery, especially at night when you are not up moving around and exerting yourself.  The breathing machine keeps your lungs expanded and your temperature down. ° ° °DIET:  As you were doing prior to hospitalization, we recommend a well-balanced diet. ° °DRESSING / WOUND CARE / SHOWERING ° °You may change your dressing 3-5 days after surgery.  Then change the dressing every day with sterile gauze.  Please use good hand washing techniques before changing the dressing.  Do not use any lotions or creams on the incision until instructed by your surgeon. ° °ACTIVITY ° °o Increase activity slowly as tolerated, but follow the weight bearing instructions below.   °o No driving for 6 weeks or until further direction given by your physician.  You cannot drive while taking narcotics.  °o No lifting or carrying greater than 10 lbs. until further directed by your surgeon. °o Avoid periods of inactivity such as sitting longer than an hour when not asleep. This helps prevent blood clots.  °o You may return to work once you are authorized by your doctor.  ° ° ° °WEIGHT BEARING  ° °Weight bearing as tolerated with assist device (walker, cane, etc) as directed, use it as long as suggested by your surgeon or therapist, typically at  least 4-6 weeks. ° ° °EXERCISES ° °Results after joint replacement surgery are often greatly improved when you follow the exercise, range of motion and muscle strengthening exercises prescribed by your doctor. Safety measures are also important to protect the joint from further injury. Any time any of these exercises cause you to have increased pain or swelling, decrease what you are doing until you are comfortable again and then slowly increase them. If you have problems or questions, call your caregiver or physical therapist for advice.  ° °Rehabilitation is important following a joint replacement. After just a few days of immobilization, the muscles of the leg can become weakened and shrink (atrophy).  These exercises are designed to build up the tone and strength of the thigh and leg muscles and to improve motion. Often times heat used for twenty to thirty minutes before working out will loosen up your tissues and help with improving the range of motion but do not use heat for the first two weeks following surgery (sometimes heat can increase post-operative swelling).  ° °These exercises can be done on a training (exercise) mat, on the floor, on a table or on a bed. Use whatever works the best and is most comfortable for you.    Use music or television while you are exercising so that the exercises are a pleasant break in your day. This will make your life better with the exercises acting as a break   in your routine that you can look forward to.   Perform all exercises about fifteen times, three times per day or as directed.  You should exercise both the operative leg and the other leg as well. ° °Exercises include: °  °• Quad Sets - Tighten up the muscle on the front of the thigh (Quad) and hold for 5-10 seconds.   °• Straight Leg Raises - With your knee straight (if you were given a brace, keep it on), lift the leg to 60 degrees, hold for 3 seconds, and slowly lower the leg.  Perform this exercise against  resistance later as your leg gets stronger.  °• Leg Slides: Lying on your back, slowly slide your foot toward your buttocks, bending your knee up off the floor (only go as far as is comfortable). Then slowly slide your foot back down until your leg is flat on the floor again.  °• Angel Wings: Lying on your back spread your legs to the side as far apart as you can without causing discomfort.  °• Hamstring Strength:  Lying on your back, push your heel against the floor with your leg straight by tightening up the muscles of your buttocks.  Repeat, but this time bend your knee to a comfortable angle, and push your heel against the floor.  You may put a pillow under the heel to make it more comfortable if necessary.  ° °A rehabilitation program following joint replacement surgery can speed recovery and prevent re-injury in the future due to weakened muscles. Contact your doctor or a physical therapist for more information on knee rehabilitation.  ° ° °CONSTIPATION ° °Constipation is defined medically as fewer than three stools per week and severe constipation as less than one stool per week.  Even if you have a regular bowel pattern at home, your normal regimen is likely to be disrupted due to multiple reasons following surgery.  Combination of anesthesia, postoperative narcotics, change in appetite and fluid intake all can affect your bowels.  ° °YOU MUST use at least one of the following options; they are listed in order of increasing strength to get the job done.  They are all available over the counter, and you may need to use some, POSSIBLY even all of these options:   ° °Drink plenty of fluids (prune juice may be helpful) and high fiber foods °Colace 100 mg by mouth twice a day  °Senokot for constipation as directed and as needed Dulcolax (bisacodyl), take with full glass of water  °Miralax (polyethylene glycol) once or twice a day as needed. ° °If you have tried all these things and are unable to have a bowel  movement in the first 3-4 days after surgery call either your surgeon or your primary doctor.   ° °If you experience loose stools or diarrhea, hold the medications until you stool forms back up.  If your symptoms do not get better within 1 week or if they get worse, check with your doctor.  If you experience "the worst abdominal pain ever" or develop nausea or vomiting, please contact the office immediately for further recommendations for treatment. ° ° °ITCHING:  If you experience itching with your medications, try taking only a single pain pill, or even half a pain pill at a time.  You can also use Benadryl over the counter for itching or also to help with sleep.  ° °TED HOSE STOCKINGS:  Use stockings on both legs until for at least 2 weeks or as   directed by physician office. They may be removed at night for sleeping.  MEDICATIONS:  See your medication summary on the After Visit Summary that nursing will review with you.  You may have some home medications which will be placed on hold until you complete the course of blood thinner medication.  It is important for you to complete the blood thinner medication as prescribed.  PRECAUTIONS:  If you experience chest pain or shortness of breath - call 911 immediately for transfer to the hospital emergency department.   If you develop a fever greater that 101 F, purulent drainage from wound, increased redness or drainage from wound, foul odor from the wound/dressing, or calf pain - CONTACT YOUR SURGEON.                                                   FOLLOW-UP APPOINTMENTS:  If you do not already have a post-op appointment, please call the office for an appointment to be seen by your surgeon.  Guidelines for how soon to be seen are listed in your After Visit Summary, but are typically between 1-4 weeks after surgery.  OTHER INSTRUCTIONS:   Knee Replacement:  Do not place pillow under knee, focus on keeping the knee straight while resting.  MAKE SURE  YOU:   Understand these instructions.   Get help right away if you are not doing well or get worse.    Thank you for letting us be a part of your medical care team.  It is a privilege we respect greatly.  We hope these instructions will help you stay on track for a fast and full recovery!   Information on my medicine - XARELTO (Rivaroxaban)  This medication education was reviewed with me or my healthcare representative as part of my discharge preparation.  The pharmacist that spoke with me during my hospital stay was:  Duayne Cal, [facility]  Why was Xarelto prescribed for you? Xarelto was prescribed for you to reduce the risk of blood clots forming after orthopedic surgery. The medical term for these abnormal blood clots is venous thromboembolism (VTE).  What do you need to know about xarelto ? Take your Xarelto ONCE DAILY at the same time every day. You may take it either with or without food.  If you have difficulty swallowing the tablet whole, you may crush it and mix in applesauce just prior to taking your dose.  Take Xarelto exactly as prescribed by your doctor and DO NOT stop taking Xarelto without talking to the doctor who prescribed the medication.  Stopping without other VTE prevention medication to take the place of Xarelto may increase your risk of developing a clot.  After discharge, you should have regular check-up appointments with your healthcare provider that is prescribing your Xarelto.    What do you do if you miss a dose? If you miss a dose, take it as soon as you remember on the same day then continue your regularly scheduled once daily regimen the next day. Do not take two doses of Xarelto on the same day.   Important Safety Information A possible side effect of Xarelto is bleeding. You should call your healthcare provider right away if you experience any of the following: ? Bleeding from an injury or your nose that does not stop. ? Unusual colored  urine (red or  dark brown) or unusual colored stools (red or black). ? Unusual bruising for unknown reasons. ? A serious fall or if you hit your head (even if there is no bleeding).  Some medicines may interact with Xarelto and might increase your risk of bleeding while on Xarelto. To help avoid this, consult your healthcare provider or pharmacist prior to using any new prescription or non-prescription medications, including herbals, vitamins, non-steroidal anti-inflammatory drugs (NSAIDs) and supplements.  This website has more information on Xarelto: [email or web address]

## 2016-07-16 NOTE — Evaluation (Signed)
Occupational Therapy Evaluation Patient Details Name: [name] MRN: [id number] DOB: May 30, 1966 Today's Date: 07/16/2016    History of Present Illness 50 y.o. female admitted to [facility] on 07/15/16 for R TKA.  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   Clinical Impression   PTA Pt independent in ADL and IADL with assistive devices from TKA earlier this year. Pt presenting with below deficits in self care tasks. Pt will benefit from skilled OT intervention in the acute care setting before d/c to SNF to maximize independence in ADL.     Follow Up Recommendations  SNF    Equipment Recommendations  Other (comment) (To be determined by next venue of care)    Recommendations for Other Services       Precautions / Restrictions Precautions Precautions: Knee Precaution Booklet Issued: Yes (comment) (previously provided by PT) Precaution Comments: knee exercise handout given and reviewed Required Braces or Orthoses: Knee Immobilizer - Right Knee Immobilizer - Right: Other (comment) (no orders for, but used today--pt's preference) Restrictions Weight Bearing Restrictions: Yes RLE Weight Bearing: Weight bearing as tolerated      Mobility Bed Mobility Overal bed mobility: Needs Assistance Bed Mobility: Supine to Sit     Supine to sit: Min assist;HOB elevated     General bed mobility comments: Min assist to help progress right leg to EOB.  HOB only minimally elevated, pt using bedrail for leverage  Transfers Overall transfer level: Needs assistance Equipment used: Rolling walker (2 wheeled) Transfers: Sit to/from Stand Sit to Stand: Min assist         General transfer comment: Min assist to help support trunk during transitions, verbal cues for safe hand placement.     Balance Overall balance assessment: Needs assistance Sitting-balance support: Feet  supported;No upper extremity supported Sitting balance-Leahy Scale: Good     Standing balance support: Bilateral upper extremity supported;During functional activity Standing balance-Leahy Scale: Poor                              ADL Overall ADL's : Needs assistance/impaired Eating/Feeding: Set up;Sitting   Grooming: Wash/dry hands;Wash/dry face;Set up;Sitting   Upper Body Bathing: Minimal assitance;Sitting   Lower Body Bathing: Sitting/lateral leans;Maximal assistance Lower Body Bathing Details (indicate cue type and reason): peri care on [facility] over toilet     Lower Body Dressing: Bed level;Minimal assistance Lower Body Dressing Details (indicate cue type and reason): able to don sock at bed level Toilet Transfer: Ambulation;BSC;Comfort height toilet;RW;Minimal assistance Toilet Transfer Details (indicate cue type and reason): vc for safety on speed of descent Toileting- Clothing Manipulation and Hygiene: Total assistance;Sit to/from stand       Functional mobility during ADLs: Minimal assistance;Rolling walker       Vision     Perception     Praxis      Pertinent Vitals/Pain Pain Assessment: Faces Faces Pain Scale: Hurts even more Pain Location: Right knee Pain Descriptors / Indicators: Aching;Sore;Grimacing Pain Intervention(s): Monitored during session;Limited activity within patient's tolerance;Repositioned;Patient requesting pain meds-RN notified     Hand Dominance Right   Extremity/Trunk Assessment Upper Extremity Assessment Upper Extremity Assessment: Overall WFL for tasks assessed   Lower Extremity Assessment Lower Extremity Assessment: RLE deficits/detail RLE Deficits / Details: RLE with post op deficits in ROM and strength   Cervical / Trunk Assessment Cervical /  Trunk Assessment: Other exceptions Cervical / Trunk Exceptions: h/o spinal stenosis listed in chart   Communication Communication Communication: No difficulties   Cognition  Arousal/Alertness: Lethargic;Suspect due to medications Behavior During Therapy: [facility] for tasks assessed/performed Overall Cognitive Status: Within Functional Limits for tasks assessed                     General Comments       Exercises Exercises: Total Joint     Shoulder Instructions      Home Living Family/patient expects to be discharged to:: Skilled nursing facility Living Arrangements: Alone Available Help at Discharge: Friend(s);Available PRN/intermittently Type of Home: House       Home Layout: One level     Bathroom Shower/Tub: Tub/shower unit Shower/tub characteristics: Curtain Biochemist, clinical: Standard     Home Equipment: Environmental consultant - 2 wheels;Bedside commode;Shower seat   Additional Comments: Pt lives with roommate who works during the day       Prior Functioning/Environment Level of Independence: Independent        Comments: not working, drives        OT Problem List: Decreased strength;Decreased range of motion;Decreased activity tolerance;Impaired balance (sitting and/or standing);Decreased knowledge of use of DME or AE;Pain;Obesity   OT Treatment/Interventions: Self-care/ADL training;Therapeutic exercise;Energy conservation;DME and/or AE instruction;Therapeutic activities;Patient/family education;Balance training    OT Goals(Current goals can be found in the care plan section) Acute Rehab OT Goals Patient Stated Goal: to go to rehab before going home alone OT Goal Formulation: With patient Time For Goal Achievement: 07/23/16 Potential to Achieve Goals: Good ADL Goals Pt Will Perform Grooming: with modified independence;standing Pt Will Perform Lower Body Dressing: with set-up;with caregiver independent in assisting;sitting/lateral leans Pt Will Transfer to Toilet: ambulating;bedside commode;with supervision Pt Will Perform Toileting - Clothing Manipulation and hygiene: with modified independence;sit to/from stand Pt Will Perform Tub/Shower  Transfer: Tub transfer;with min guard assist;with caregiver independent in assisting;ambulating;shower seat;rolling walker  OT Frequency: Min 2X/week   Barriers to D/C: Decreased caregiver support (no 24 hour care available)  Pt went to SNF after last knee surgery, and plans on doing the same thing       Co-evaluation PT/OT/SLP Co-Evaluation/Treatment: Yes Reason for Co-Treatment: For patient/therapist safety;Other (comment) (Pt didn't want to get up another time this PM) PT goals addressed during session: Mobility/safety with mobility;Balance;Proper use of DME;Strengthening/ROM OT goals addressed during session: ADL's and self-care      End of Session Equipment Utilized During Treatment: Rolling walker Nurse Communication: Patient requests pain meds  Activity Tolerance: Patient tolerated treatment well;Patient limited by lethargy Patient left: in bed;with call bell/phone within reach   Time: [id number] OT Time Calculation (min): 25 min Charges:  OT General Charges $OT Visit: 1 Procedure OT Evaluation $OT Eval Moderate Complexity: 1 Procedure G-Codes:    Merri Ray Senie Lanese 2016/08/13, 4:23 PM Hulda Humphrey OTR/L [phone]

## 2016-07-16 NOTE — Progress Notes (Signed)
Daughter of pt present in room and declines for pt to receive her  Scheduled xanax. Daughter states the pt has her own xanax in her pocketbook that she has been taking since being an in patient.  Daughter assures RN that she is removing pt pocketbook with meds in it to prevent pt from taking home meds.

## 2016-07-17 ENCOUNTER — Inpatient Hospital Stay (HOSPITAL_COMMUNITY): Payer: Medicare HMO

## 2016-07-17 LAB — CBC
HCT: 31 % — ABNORMAL LOW (ref 36.0–46.0)
HEMOGLOBIN: 9.7 g/dL — AB (ref 12.0–15.0)
MCH: 25.6 pg — AB (ref 26.0–34.0)
MCHC: 31.3 g/dL (ref 30.0–36.0)
MCV: 81.8 fL (ref 78.0–100.0)
PLATELETS: 228 10*3/uL (ref 150–400)
RBC: 3.79 MIL/uL — AB (ref 3.87–5.11)
RDW: 16 % — ABNORMAL HIGH (ref 11.5–15.5)
WBC: 7.8 10*3/uL (ref 4.0–10.5)

## 2016-07-17 MED ORDER — NICOTINE 14 MG/24HR TD PT24
14.0000 mg | MEDICATED_PATCH | Freq: Every day | TRANSDERMAL | Status: DC
  Administered 2016-07-17 – 2016-07-18 (×2): 14 mg via TRANSDERMAL
  Filled 2016-07-17 (×2): qty 1

## 2016-07-17 NOTE — Progress Notes (Signed)
Physical Therapy Treatment Patient Details Name: [name] MRN: [id number] DOB: [birth year] Today's Date: 07/17/2016    History of Present Illness 50 y.o. female admitted to [facility] on 07/15/16 for R TKA.  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    PT Comments    Patient is making gradual progress toward PT goals. Pt continues to be limited by lethargy but is possibly baseline. Continue to progress as tolerated with anticipated d/c to SNF for further skilled PT services.    Follow Up Recommendations  SNF;Other (comment) (requesting [facility] in [place])     Equipment Recommendations  None recommended by PT    Recommendations for Other Services       Precautions / Restrictions Precautions Precautions: Knee Required Braces or Orthoses: Knee Immobilizer - Right Knee Immobilizer - Right: Other (comment) (no orders for, but used today--pt's preference) Restrictions Weight Bearing Restrictions: Yes RLE Weight Bearing: Weight bearing as tolerated    Mobility  Bed Mobility Overal bed mobility: Needs Assistance Bed Mobility: Supine to Sit     Supine to sit: HOB elevated;Min guard     General bed mobility comments: cues for sequencing; use of rail and HOB elevated slightly; increased time/effort  Transfers Overall transfer level: Needs assistance Equipment used: Rolling walker (2 wheeled) Transfers: Sit to/from Stand Sit to Stand: Min guard         General transfer comment: min guard for safety; cues for safe hand placement and use of AD  Ambulation/Gait Ambulation/Gait assistance: Min guard Ambulation Distance (Feet): 60 Feet Assistive device: Rolling walker (2 wheeled) Gait Pattern/deviations: Step-through pattern;Decreased stance time - right;Decreased step length - left;Decreased weight shift to right;Antalgic;Trunk flexed Gait velocity:  decreased   General Gait Details: cues for posture, safe use of AD, and encouraged to increase WB on R LE; several brief standing rest breaks due to fatigue; pt with tendency to rest forearms on RW when taking breaks   Stairs            Wheelchair Mobility    Modified Rankin (Stroke Patients Only)       Balance     Sitting balance-Leahy Scale: Good       Standing balance-Leahy Scale: Poor Standing balance comment: relies on RW for support                    Cognition Arousal/Alertness: Lethargic;Suspect due to medications Behavior During Therapy: [facility] for tasks assessed/performed Overall Cognitive Status: Within Functional Limits for tasks assessed                      Exercises Total Joint Exercises Quad Sets: AROM;Right;10 reps;Supine Heel Slides: AAROM;Right;10 reps;Supine Goniometric ROM: ~60 with AAROM    General Comments        Pertinent Vitals/Pain Pain Assessment: Faces Faces Pain Scale: Hurts even more Pain Location: R knee with therex Pain Descriptors / Indicators: Grimacing;Guarding;Sore Pain Intervention(s): Limited activity within patient's tolerance;Monitored during session;Premedicated before session;Repositioned;Patient requesting pain meds-RN notified    Home Living                      Prior Function            PT Goals (current goals can now be found in the care plan section) Acute Rehab PT Goals Patient Stated Goal: go back to sleep Progress towards  PT goals: Progressing toward goals    Frequency    7X/week      PT Plan Current plan remains appropriate    Co-evaluation             End of Session Equipment Utilized During Treatment: Gait belt;Right knee immobilizer Activity Tolerance: Patient limited by lethargy Patient left: in bed;with call bell/phone within reach;with nursing/sitter in room     Time: 1000-1045 PT Time Calculation (min) (ACUTE ONLY): 45 min  Charges:  $Gait Training:  8-22 mins $Therapeutic Exercise: 8-22 mins $Therapeutic Activity: 8-22 mins                    G Codes:      Salina April, PTA Pager: [phone]   07/17/2016, 10:58 AM

## 2016-07-17 NOTE — Progress Notes (Signed)
Patient ID: [name], female   DOB: [birth year], 50 y.o.   MRN: [id number]     Subjective:  Patient reports pain as mild.  Patient in bed and very lethargic.  She is barely able to follow commands.  She denies any CP or SOB  Objective:   VITALS:   Vitals:   07/16/16 1400 07/16/16 1800 07/16/16 2000 07/17/16 0408  BP: 124/64  132/67 125/64  Pulse: (!) 107  (!) 106 (!) 102  Resp: 18  18 18   Temp: 99.4 F (37.4 C) 99.3 F (37.4 C) 99 F (37.2 C) 98.9 F (37.2 C)  TempSrc:  Oral Oral Oral  SpO2: 93%  95% 96%    ABD soft Sensation intact distally Dorsiflexion/Plantar flexion intact Incision: no drainage Dressing wet and removed.  Wound good  Dry dressing applied  Lab Results  Component Value Date   WBC 7.8 07/17/2016   HGB 9.7 (L) 07/17/2016   HCT 31.0 (L) 07/17/2016   MCV 81.8 07/17/2016   PLT 228 07/17/2016   BMET    Component Value Date/Time   NA 133 (L) 07/16/2016 0642   K 4.6 07/16/2016 0642   CL 98 (L) 07/16/2016 0642   CO2 26 07/16/2016 0642   GLUCOSE 151 (H) 07/16/2016 0642   BUN 9 07/16/2016 0642   CREATININE 0.81 07/16/2016 0642   CALCIUM 8.5 (L) 07/16/2016 0642   GFRNONAA >60 07/16/2016 0642   GFRAA >60 07/16/2016 [id number]     Assessment/Plan: 2 Days Post-Op   Principal Problem:   Primary localized osteoarthritis of right knee Active Problems:   S/P total knee arthroplasty   Advance diet Up with therapy Plan for discharge tomorrow WBAT Dry dressing PRN Plan for SNF   Remonia Richter 07/17/2016, 4:04 PM  Agree with above.  D/c'd dilaudid, and decreased oxycodone yesterday.  Will continue to decrease medications, and eliminate her ability to self medicate.  Marchia Bond, MD Cell [phone]

## 2016-07-17 NOTE — Clinical Social Work Note (Signed)
CSW attempted to talk to pt at bedside however, pt was asleep and would not arouse to voice.   [address], [place]

## 2016-07-18 LAB — CBC
HCT: 32.7 % — ABNORMAL LOW (ref 36.0–46.0)
HEMOGLOBIN: 10.5 g/dL — AB (ref 12.0–15.0)
MCH: 25.9 pg — AB (ref 26.0–34.0)
MCHC: 32.1 g/dL (ref 30.0–36.0)
MCV: 80.5 fL (ref 78.0–100.0)
Platelets: 289 10*3/uL (ref 150–400)
RBC: 4.06 MIL/uL (ref 3.87–5.11)
RDW: 16 % — ABNORMAL HIGH (ref 11.5–15.5)
WBC: 9.1 10*3/uL (ref 4.0–10.5)

## 2016-07-18 MED ORDER — NICOTINE 14 MG/24HR TD PT24: 14.0000 mg | MEDICATED_PATCH | Freq: Every day | TRANSDERMAL | 0 refills | Status: DC

## 2016-07-18 MED ORDER — ALPRAZOLAM 0.5 MG PO TABS: 1.0000 mg | ORAL_TABLET | Freq: Three times a day (TID) | ORAL | Status: DC | PRN

## 2016-07-18 MED ORDER — OXYCODONE HCL 5 MG PO TABS: 10.0000 mg | ORAL_TABLET | Freq: Four times a day (QID) | ORAL | Status: DC | PRN

## 2016-07-18 NOTE — Progress Notes (Signed)
Report called to [facility] [phone]. All personal belongings with pt. Pt. Demonstrates no s/sx of distress at this time.

## 2016-07-18 NOTE — Clinical Social Work Note (Signed)
Clinical Social Worker facilitated patient discharge including contacting patient family and facility to confirm patient discharge plans.  Clinical information faxed to facility and family agreeable with plan.  CSW arranged transport via family to [facility].  RN to call report prior to discharge.  Clinical Social Worker will sign off for now as social work intervention is no longer needed. Please consult [place] again if new need arises.  Barbette Or, [place]

## 2016-07-18 NOTE — Clinical Social Work Placement (Signed)
   CLINICAL SOCIAL WORK PLACEMENT  NOTE  Date:  07/18/2016  Patient Details  Name: TAIMA RIDGEWAY MRN: [id number] Date of Birth: 11-26-1965  Clinical Social Work is seeking post-discharge placement for this patient at the [place] level of care (*CSW will initial, date and re-position this form in  chart as items are completed):  Yes   Patient/family provided with [place] Work Department's list of facilities offering this level of care within the geographic area requested by the patient (or if unable, by the patient's family).  Yes   Patient/family informed of their freedom to choose among providers that offer the needed level of care, that participate in Medicare, Medicaid or managed care program needed by the patient, have an available bed and are willing to accept the patient.  Yes   Patient/family informed of [facility]'s ownership interest in [facility] and [facility], as well as of the fact that they are under no obligation to receive care at these facilities.  PASRR submitted to EDS on 07/17/16   30 day note placed on chart for MD signature, PA assisting getting this signed.  PASRR number received on       Existing PASRR number confirmed on       FL2 transmitted to all facilities in geographic area requested by pt/family on 07/18/16     FL2 transmitted to all facilities within larger geographic area on       Patient informed that his/her managed care company has contracts with or will negotiate with certain facilities, including the following:            Patient/family informed of bed offers received.  Patient chooses bed at       Physician recommends and patient chooses bed at      Patient to be transferred to   on  .  Patient to be transferred to facility by       Patient family notified on   of transfer.  Name of family member notified:        PHYSICIAN Please prepare priority discharge summary, including  medications, Please prepare prescriptions, Please sign FL2     Additional Comment:    _______________________________________________ Rigoberto Noel, LCSW 07/18/2016, 10:08 AM

## 2016-07-18 NOTE — Discharge Summary (Addendum)
Physician Discharge Summary  Patient ID: [name] MRN: [id number] DOB/AGE: [birth year] 50 y.o.  Admit date: 07/15/2016 Discharge date: 07/18/2016  Admission Diagnoses:  Primary localized osteoarthritis of right knee  Discharge Diagnoses:  Principal Problem:   Primary localized osteoarthritis of right knee Active Problems:   S/P total knee arthroplasty   Past Medical History:  Diagnosis Date  . Anemia   . Anxiety   . Bipolar 1 disorder ([place])   . 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 reports that she is cured   . COPD (chronic obstructive pulmonary disease) ([place])   . Depression   . Fibromyalgia   . GERD (gastroesophageal reflux disease)   . Headache    migraine's in 20's  . Hypertension   . Insomnia   . Obesity   . Osteoarthritis    knees, elbows   . 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 Pneumonia    developed in 07/2015- post shoulder replacement   . 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 Primary localized osteoarthritis of left knee 03/04/2016  . Primary localized osteoarthritis of right knee 07/15/2016  . PUD (peptic ulcer disease)   . Spinal stenosis     Surgeries: Procedure(s): TOTAL KNEE ARTHROPLASTY on 07/15/2016   Consultants (if any):   Discharged Condition: Improved  Hospital Course: [name] is an 50 y.o. female who was admitted 07/15/2016 with a diagnosis of Primary localized osteoarthritis of right knee and went to the operating room on 07/15/2016 and underwent the above named procedures.    She was given perioperative antibiotics:  Anti-infectives    Start     Dose/Rate Route Frequency Ordered Stop   07/15/16 2230  ceFAZolin (ANCEF) IVPB 2g/100 mL premix     2 g 200 mL/hr over 30 Minutes Intravenous Every 6 hours 07/15/16 1842 07/16/16 0038   07/15/16 1330  ceFAZolin (ANCEF) IVPB 2g/100 mL premix     2 g 200 mL/hr over 30 Minutes Intravenous Every 6 hours 07/15/16 1235 07/16/16 0129    07/15/16 0600  ceFAZolin (ANCEF) 3 g in dextrose 5 % 50 mL IVPB     3 g 130 mL/hr over 30 Minutes Intravenous On call to O.R. 07/14/16 1358 07/15/16 0730    .  She was given sequential compression devices, early ambulation, and xarelto for DVT prophylaxis.  She benefited maximally from the hospital stay and there were no complications.    Recent vital signs:  Vitals:   07/17/16 2037 07/18/16 0500  BP: 138/80 (!) 138/97  Pulse: (!) 102 (!) 103  Resp: 18 17  Temp: 98.7 F (37.1 C) 98.7 F (37.1 C)    Recent laboratory studies:  Lab Results  Component Value Date   HGB 10.5 (L) 07/18/2016   HGB 9.7 (L) 07/17/2016   HGB 10.7 (L) 07/16/2016   Lab Results  Component Value Date   WBC 9.1 07/18/2016   PLT 289 07/18/2016   Lab Results  Component Value Date   INR 1.17 07/19/2015   Lab Results  Component Value Date   NA 133 (L) 07/16/2016   K 4.6 07/16/2016   CL 98 (L) 07/16/2016   CO2 26 07/16/2016   BUN 9 07/16/2016   CREATININE 0.81 07/16/2016   GLUCOSE 151 (H) 07/16/2016    Discharge Medications:     Medication List    STOP taking these medications   aspirin 81 MG chewable tablet   ibuprofen 800 MG tablet Commonly known as:  ADVIL,MOTRIN     TAKE these medications   ALPRAZolam 1 MG tablet Commonly known as:  XANAX Take 1 tablet (1 mg total) by mouth 3 (three) times daily.   amitriptyline 50 MG tablet Commonly known as:  ELAVIL TAKE 1 TABLET BY MOUTH EVERY NIGHT AT BEDTIME   baclofen 10 MG tablet Commonly known as:  LIORESAL Take 1 tablet (10 mg total) by mouth 3 (three) times daily. What changed:  See the new instructions.   divalproex 500 MG 24 hr tablet Commonly known as:  DEPAKOTE ER Take 1,000 mg by mouth every evening.   DULoxetine 60 MG capsule Commonly known as:  CYMBALTA Take 60 mg by mouth daily.   ferrous sulfate 325 (65 FE) MG tablet Take 325 mg by mouth 2 (two) times daily with a meal.   hydrochlorothiazide 12.5 MG  tablet Commonly known as:  HYDRODIURIL Take 12.5 mg by mouth daily.   lidocaine 5 % Commonly known as:  LIDODERM Place 1 patch onto the skin every 12 (twelve) hours. Remove & Discard patch within 12 hours or as directed   Melatonin 1 MG Tabs Take 1 mg by mouth at bedtime.   nicotine 14 mg/24hr patch Commonly known as:  NICODERM CQ - dosed in mg/24 hours Place 1 patch (14 mg total) onto the skin daily.   nitroGLYCERIN 0.4 MG SL tablet Commonly known as:  NITROSTAT Place 0.4 mg under the tongue every 5 (five) minutes as needed for chest pain.   oxyCODONE 15 MG immediate release tablet Commonly known as:  ROXICODONE Take 1 tablet (15 mg total) by mouth every 4 (four) hours as needed for pain. What changed:  when to take this   polyethylene glycol packet Commonly known as:  MIRALAX / GLYCOLAX Take 17 g by mouth daily as needed for mild constipation.   PROAIR HFA 108 (90 Base) MCG/ACT inhaler Generic drug:  albuterol Inhale 2 puffs into the lungs every 6 (six) hours as needed for wheezing or shortness of breath.   rivaroxaban 10 MG Tabs tablet Commonly known as:  XARELTO Take 1 tablet (10 mg total) by mouth daily.   sennosides-docusate sodium 8.6-50 MG tablet Commonly known as:  SENOKOT-S Take 2 tablets by mouth daily.       Diagnostic Studies: Dg Chest 2 View  Result Date: 07/17/2016 CLINICAL DATA:  Patient with shortness of breath. EXAM: CHEST  2 VIEW COMPARISON:  Chest radiograph 03/06/2016. FINDINGS: Low lung volumes. Stable enlarged cardiac and mediastinal contours. No large area of pulmonary consolidation. No pleural effusion or pneumothorax. Thoracic spine degenerative changes. IMPRESSION: Cardiomegaly.  No acute cardiopulmonary process. Electronically Signed   By: Lovey Newcomer M.D.   On: 07/17/2016 09:47   Dg Knee Right Port  Result Date: 07/15/2016 CLINICAL DATA:  Right knee replacement EXAM: PORTABLE RIGHT KNEE - 1-2 VIEW COMPARISON:  06/17/2016 FINDINGS: Right  knee total replacement has been performed. Components appear aligned. No acute osseous or hardware abnormality. Expected postop changes of the soft tissues. IMPRESSION: Expected appearance and alignment status post right knee arthroplasty. Electronically Signed   By: Jerilynn Mages.  Shick M.D.   On: 07/15/2016 11:16    Disposition: SNF    Follow-up Information    Verdella Laidlaw P, MD. Schedule an appointment as soon as possible for a visit in 2 weeks.   Specialty:  Orthopedic Surgery Contact information: [place] [address] [phone]            Signed: Johnny Bridge  07/18/2016, 8:28 AM

## 2016-07-18 NOTE — NC FL2 (Signed)
[place] LEVEL OF CARE SCREENING TOOL     IDENTIFICATION  Patient Name: [name] Birthdate: 09-03-1966 Sex: female Admission Date (Current Location): 07/15/2016  [facility] and [place] Number:  Herbalist and Address:  [facility], [place] [address] [place], [place] [address]      Provider Number: [id number]  Attending Physician Name and Address:  Marchia Bond, MD  Relative Name and Phone Number:       Current Level of Care: Hospital Recommended Level of Care: [place] Prior Approval Number:    Date Approved/Denied:   PASRR Number: [id number]   Discharge Plan: SNF    Current Diagnoses: Patient Active Problem List   Diagnosis Date Noted  . Primary localized osteoarthritis of right knee 07/15/2016  . Postoperative anemia due to acute blood loss 03/07/2016  . 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 Sinus tachycardia 03/05/2016  . GERD (gastroesophageal reflux disease) 03/05/2016  . SVT (supraventricular tachycardia) ([place])   . Atelectasis   . Absolute anemia   . Arterial hypotension   . Pulmonary emphysema ([place])   . Primary localized osteoarthritis of left knee 03/04/2016  . S/P total knee arthroplasty 03/04/2016  . Sepsis ([place]) 07/19/2015  . Tobacco abuse 07/19/2015  . COPD (chronic obstructive pulmonary disease) ([place]) 07/19/2015  . S/P shoulder replacement   . Tachycardia   . Tremor of both hands 02/28/2015  . Lumbar facet arthropathy 02/28/2015  . Chest pain at rest 12/25/2013  . Chronic pain disorder 12/25/2013  . Chest pain due to psychological stress 12/25/2013  . Insomnia, unspecified 10/04/2013  . Insomnia 08/03/2013  . 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 Unspecified hereditary and idiopathic peripheral neuropathy 05/10/2013  . Lumbosacral spondylosis without myelopathy 05/10/2013  . Osteoarthritis of both knees 05/10/2013  . Left rotator cuff tear  arthropathy 05/10/2013  . RESTLESS LEG SYNDROME 05/26/2009  . HYPERSOMNIA, ASSOCIATED WITH SLEEP APNEA 03/28/2009  . Anxiety state 02/23/2009  . Depression 02/23/2009  . ARTHRITIS 02/23/2009  . DYSPHAGIA UNSPECIFIED 02/23/2009  . CERVICAL CANCER, HX OF 02/23/2009  . Bipolar disorder ([place]) 02/21/2009  . GERD 02/21/2009  . IRRITABLE BOWEL SYNDROME 02/21/2009  . NECK PAIN 02/21/2009  . Myalgia and myositis 02/21/2009    Orientation RESPIRATION BLADDER Height & Weight     Self, Time, Situation, Place      Weight:   Height:     BEHAVIORAL SYMPTOMS/MOOD NEUROLOGICAL BOWEL NUTRITION STATUS     (NONE)   Diet (Regular)  AMBULATORY STATUS COMMUNICATION OF NEEDS Skin   Limited Assist                           Personal Care Assistance Level of Assistance  Bathing, Feeding, Dressing Bathing Assistance: Limited assistance Feeding assistance: Independent Dressing Assistance: Limited assistance     Functional Limitations Info  Sight, Hearing, Speech Sight Info: Adequate Hearing Info: Adequate Speech Info: Adequate    SPECIAL CARE FACTORS FREQUENCY  PT (By licensed PT), OT (By licensed OT)     PT Frequency: 5/week OT Frequency: 5/week            Contractures Contractures Info: Not present    Additional Factors Info  Psychotropic, Code Status, Allergies Code Status Info: Full Code Allergies Info: Abilify Aripiprazole, Latuda Lurasidone Hcl, Naproxen, Wellbutrin Bupropion Psychotropic Info: Depakote, cymbalta         Current Medications (07/18/2016):  This is the  current hospital active medication list Current Facility-Administered Medications  Medication Dose Route Frequency Provider Last Rate Last Dose  . 0.45 % NaCl with KCl 20 mEq / L infusion   Intravenous Continuous Marchia Bond, MD 75 mL/hr at 07/15/16 1347    . acetaminophen (TYLENOL) tablet 650 mg  650 mg Oral Q6H PRN Marchia Bond, MD   650 mg at 07/16/16 1809   Or  . acetaminophen (TYLENOL) suppository  650 mg  650 mg Rectal Q6H PRN Marchia Bond, MD      . albuterol (PROVENTIL) (2.5 MG/3ML) 0.083% nebulizer solution 3 mL  3 mL Inhalation Q6H PRN Marchia Bond, MD      . ALPRAZolam Duanne Moron) tablet 1 mg  1 mg Oral TID PRN Marchia Bond, MD      . alum & mag hydroxide-simeth (MAALOX/MYLANTA) 200-200-20 MG/5ML suspension 30 mL  30 mL Oral Q4H PRN Marchia Bond, MD      . bisacodyl (DULCOLAX) suppository 10 mg  10 mg Rectal Daily PRN Marchia Bond, MD      . diphenhydrAMINE (BENADRYL) 12.5 MG/5ML elixir 12.5-25 mg  12.5-25 mg Oral Q4H PRN Marchia Bond, MD      . divalproex (DEPAKOTE ER) 24 hr tablet 1,000 mg  1,000 mg Oral QPM Marchia Bond, MD   1,000 mg at 07/17/16 1717  . docusate sodium (COLACE) capsule 100 mg  100 mg Oral BID Marchia Bond, MD   100 mg at 07/18/16 1015  . DULoxetine (CYMBALTA) DR capsule 60 mg  60 mg Oral Daily Marchia Bond, MD   60 mg at 07/18/16 1015  . ferrous sulfate tablet 325 mg  325 mg Oral BID WC Marchia Bond, MD   325 mg at 07/18/16 1015  . gabapentin (NEURONTIN) capsule 300 mg  300 mg Oral TID Marchia Bond, MD   300 mg at 07/18/16 1015  . hydrochlorothiazide (HYDRODIURIL) tablet 12.5 mg  12.5 mg Oral Daily Marchia Bond, MD   12.5 mg at 07/18/16 1015  . lidocaine (LIDODERM) 5 % 1 patch  1 patch Transdermal Daily Marchia Bond, MD   1 patch at 07/17/16 1035  . magnesium citrate solution 1 Bottle  1 Bottle Oral Once PRN Marchia Bond, MD      . Melatonin TABS 3 mg  3 mg Oral QHS Marchia Bond, MD   3 mg at 07/17/16 2227  . menthol-cetylpyridinium (CEPACOL) lozenge 3 mg  1 lozenge Oral PRN Marchia Bond, MD       Or  . phenol (CHLORASEPTIC) mouth spray 1 spray  1 spray Mouth/Throat PRN Marchia Bond, MD      . methocarbamol (ROBAXIN) tablet 500 mg  500 mg Oral Q6H PRN Marchia Bond, MD   500 mg at 07/16/16 0024   Or  . methocarbamol (ROBAXIN) 500 mg in dextrose 5 % 50 mL IVPB  500 mg Intravenous Q6H PRN Marchia Bond, MD      . metoCLOPramide (REGLAN) tablet 5-10 mg   5-10 mg Oral Q8H PRN Marchia Bond, MD       Or  . metoCLOPramide (REGLAN) injection 5-10 mg  5-10 mg Intravenous Q8H PRN Marchia Bond, MD      . nicotine (NICODERM CQ - dosed in mg/24 hours) patch 14 mg  14 mg Transdermal Daily Marchia Bond, MD   14 mg at 07/18/16 1016  . nitroGLYCERIN (NITROSTAT) SL tablet 0.4 mg  0.4 mg Sublingual Q5 min PRN Marchia Bond, MD      . ondansetron [facility]) tablet 4 mg  4 mg Oral Q6H PRN Marchia Bond, MD       Or  . ondansetron [facility]) injection 4 mg  4 mg Intravenous Q6H PRN Marchia Bond, MD      . oxyCODONE (Oxy IR/ROXICODONE) immediate release tablet 10-15 mg  10-15 mg Oral Q6H PRN Marchia Bond, MD      . polyethylene glycol (MIRALAX / GLYCOLAX) packet 17 g  17 g Oral Daily PRN Marchia Bond, MD      . rivaroxaban Alveda Reasons) tablet 10 mg  10 mg Oral Q breakfast Marchia Bond, MD   10 mg at 07/18/16 1015  . senna (SENOKOT) tablet 8.6 mg  1 tablet Oral BID Marchia Bond, MD   8.6 mg at 07/18/16 1015     Discharge Medications: Please see discharge summary for a list of discharge medications.  Relevant Imaging Results:  Relevant Lab Results:   Additional Information SSN: [id number]   Barbette Or, [place]

## 2016-07-18 NOTE — Care Management Important Message (Signed)
Important Message  Patient Details  Name: [name] MRN: [id number] Date of Birth: Nov 16, 1965   Medicare Important Message Given:  Yes    Javonni Macke Montine Circle 07/18/2016, 11:53 AM

## 2016-07-18 NOTE — Clinical Social Work Note (Signed)
CSW unable to engage patient for assessment. CSW has made several attempts to reach patient's daughter's by phone to complete assessment and make arrangements for the patient's discharge. Unfortunately CSW was unable to leave messages due to the daughter's voicemail box not being setup.  Liz Beach MSW, [place], [place], [id number]

## 2016-07-18 NOTE — NC FL2 (Signed)
[place] LEVEL OF CARE SCREENING TOOL     IDENTIFICATION  Patient Name: [name] Birthdate: 1965/11/07 Sex: female Admission Date (Current Location): 07/15/2016  [facility] and [place] Number:  Herbalist and Address:  [facility]. [facility], [place] [address] [place], [place] [address]      Provider Number: [id number]  Attending Physician Name and Address:  Marchia Bond, MD  Relative Name and Phone Number:       Current Level of Care: Hospital Recommended Level of Care: [place] Prior Approval Number:    Date Approved/Denied:   PASRR Number:    Discharge Plan: SNF    Current Diagnoses: Patient Active Problem List   Diagnosis Date Noted  . Primary localized osteoarthritis of right knee 07/15/2016  . Postoperative anemia due to acute blood loss 03/07/2016  . 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 Sinus tachycardia 03/05/2016  . GERD (gastroesophageal reflux disease) 03/05/2016  . SVT (supraventricular tachycardia) ([place])   . Atelectasis   . Absolute anemia   . Arterial hypotension   . Pulmonary emphysema ([place])   . Primary localized osteoarthritis of left knee 03/04/2016  . S/P total knee arthroplasty 03/04/2016  . Sepsis ([place]) 07/19/2015  . Tobacco abuse 07/19/2015  . COPD (chronic obstructive pulmonary disease) ([place]) 07/19/2015  . S/P shoulder replacement   . Tachycardia   . Tremor of both hands 02/28/2015  . Lumbar facet arthropathy 02/28/2015  . Chest pain at rest 12/25/2013  . Chronic pain disorder 12/25/2013  . Chest pain due to psychological stress 12/25/2013  . Insomnia, unspecified 10/04/2013  . Insomnia 08/03/2013  . 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 Unspecified hereditary and idiopathic peripheral neuropathy 05/10/2013  . Lumbosacral spondylosis without myelopathy 05/10/2013  . Osteoarthritis of both knees 05/10/2013  . Left rotator cuff tear arthropathy  05/10/2013  . RESTLESS LEG SYNDROME 05/26/2009  . HYPERSOMNIA, ASSOCIATED WITH SLEEP APNEA 03/28/2009  . Anxiety state 02/23/2009  . Depression 02/23/2009  . ARTHRITIS 02/23/2009  . DYSPHAGIA UNSPECIFIED 02/23/2009  . CERVICAL CANCER, HX OF 02/23/2009  . Bipolar disorder ([place]) 02/21/2009  . GERD 02/21/2009  . IRRITABLE BOWEL SYNDROME 02/21/2009  . NECK PAIN 02/21/2009  . Myalgia and myositis 02/21/2009    Orientation RESPIRATION BLADDER Height & Weight     Self, Time, Situation, Place      Weight:   Height:     BEHAVIORAL SYMPTOMS/MOOD NEUROLOGICAL BOWEL NUTRITION STATUS     (NONE)   Diet (Regular)  AMBULATORY STATUS COMMUNICATION OF NEEDS Skin   Limited Assist                           Personal Care Assistance Level of Assistance  Bathing, Feeding, Dressing Bathing Assistance: Limited assistance Feeding assistance: Independent Dressing Assistance: Limited assistance     Functional Limitations Info  Sight, Hearing, Speech Sight Info: Adequate Hearing Info: Adequate Speech Info: Adequate    SPECIAL CARE FACTORS FREQUENCY  PT (By licensed PT), OT (By licensed OT)     PT Frequency: 5/week OT Frequency: 5/week            Contractures Contractures Info: Not present    Additional Factors Info  Psychotropic, Code Status, Allergies Code Status Info: Full Code Allergies Info: Abilify Aripiprazole, Latuda Lurasidone Hcl, Naproxen, Wellbutrin Bupropion Psychotropic Info: Depakote, cymbalta         Current Medications (07/18/2016):  This is the  current hospital active medication list Current Facility-Administered Medications  Medication Dose Route Frequency Provider Last Rate Last Dose  . 0.45 % NaCl with KCl 20 mEq / L infusion   Intravenous Continuous Marchia Bond, MD 75 mL/hr at 07/15/16 1347    . acetaminophen (TYLENOL) tablet 650 mg  650 mg Oral Q6H PRN Marchia Bond, MD   650 mg at 07/16/16 1809   Or  . acetaminophen (TYLENOL) suppository 650 mg  650  mg Rectal Q6H PRN Marchia Bond, MD      . albuterol (PROVENTIL) (2.5 MG/3ML) 0.083% nebulizer solution 3 mL  3 mL Inhalation Q6H PRN Marchia Bond, MD      . ALPRAZolam Duanne Moron) tablet 1 mg  1 mg Oral TID PRN Marchia Bond, MD      . alum & mag hydroxide-simeth (MAALOX/MYLANTA) 200-200-20 MG/5ML suspension 30 mL  30 mL Oral Q4H PRN Marchia Bond, MD      . bisacodyl (DULCOLAX) suppository 10 mg  10 mg Rectal Daily PRN Marchia Bond, MD      . diphenhydrAMINE (BENADRYL) 12.5 MG/5ML elixir 12.5-25 mg  12.5-25 mg Oral Q4H PRN Marchia Bond, MD      . divalproex (DEPAKOTE ER) 24 hr tablet 1,000 mg  1,000 mg Oral QPM Marchia Bond, MD   1,000 mg at 07/17/16 1717  . docusate sodium (COLACE) capsule 100 mg  100 mg Oral BID Marchia Bond, MD   100 mg at 07/17/16 2227  . DULoxetine (CYMBALTA) DR capsule 60 mg  60 mg Oral Daily Marchia Bond, MD   60 mg at 07/17/16 1036  . ferrous sulfate tablet 325 mg  325 mg Oral BID WC Marchia Bond, MD   325 mg at 07/17/16 1717  . gabapentin (NEURONTIN) capsule 300 mg  300 mg Oral TID Marchia Bond, MD   300 mg at 07/17/16 2227  . hydrochlorothiazide (HYDRODIURIL) tablet 12.5 mg  12.5 mg Oral Daily Marchia Bond, MD   12.5 mg at 07/17/16 1035  . lidocaine (LIDODERM) 5 % 1 patch  1 patch Transdermal Daily Marchia Bond, MD   1 patch at 07/17/16 1035  . magnesium citrate solution 1 Bottle  1 Bottle Oral Once PRN Marchia Bond, MD      . Melatonin TABS 3 mg  3 mg Oral QHS Marchia Bond, MD   3 mg at 07/17/16 2227  . menthol-cetylpyridinium (CEPACOL) lozenge 3 mg  1 lozenge Oral PRN Marchia Bond, MD       Or  . phenol (CHLORASEPTIC) mouth spray 1 spray  1 spray Mouth/Throat PRN Marchia Bond, MD      . methocarbamol (ROBAXIN) tablet 500 mg  500 mg Oral Q6H PRN Marchia Bond, MD   500 mg at 07/16/16 0024   Or  . methocarbamol (ROBAXIN) 500 mg in dextrose 5 % 50 mL IVPB  500 mg Intravenous Q6H PRN Marchia Bond, MD      . metoCLOPramide (REGLAN) tablet 5-10 mg  5-10 mg  Oral Q8H PRN Marchia Bond, MD       Or  . metoCLOPramide (REGLAN) injection 5-10 mg  5-10 mg Intravenous Q8H PRN Marchia Bond, MD      . nicotine (NICODERM CQ - dosed in mg/24 hours) patch 14 mg  14 mg Transdermal Daily Marchia Bond, MD   14 mg at 07/17/16 1035  . nitroGLYCERIN (NITROSTAT) SL tablet 0.4 mg  0.4 mg Sublingual Q5 min PRN Marchia Bond, MD      . ondansetron [facility]) tablet 4 mg  4 mg Oral Q6H PRN Marchia Bond, MD       Or  . ondansetron [facility]) injection 4 mg  4 mg Intravenous Q6H PRN Marchia Bond, MD      . oxyCODONE (Oxy IR/ROXICODONE) immediate release tablet 10-15 mg  10-15 mg Oral Q6H PRN Marchia Bond, MD      . polyethylene glycol (MIRALAX / GLYCOLAX) packet 17 g  17 g Oral Daily PRN Marchia Bond, MD      . rivaroxaban Alveda Reasons) tablet 10 mg  10 mg Oral Q breakfast Marchia Bond, MD   10 mg at 07/17/16 1037  . senna (SENOKOT) tablet 8.6 mg  1 tablet Oral BID Marchia Bond, MD   8.6 mg at 07/17/16 2227     Discharge Medications: Please see discharge summary for a list of discharge medications.  Relevant Imaging Results:  Relevant Lab Results:   Additional Information SSN: [id number]  Rigoberto Noel, LCSW

## 2016-08-27 ENCOUNTER — Encounter: Payer: Medicare HMO | Attending: Physical Medicine and Rehabilitation | Admitting: Registered Nurse

## 2016-08-27 ENCOUNTER — Encounter: Payer: Self-pay | Admitting: Registered Nurse

## 2016-08-27 VITALS — BP 121/81 | HR 109 | Resp 16

## 2016-08-27 DIAGNOSIS — M170 Bilateral primary osteoarthritis of knee: Secondary | ICD-10-CM

## 2016-08-27 DIAGNOSIS — M19012 Primary osteoarthritis, left shoulder: Secondary | ICD-10-CM | POA: Diagnosis present

## 2016-08-27 DIAGNOSIS — Z79899 Other long term (current) drug therapy: Secondary | ICD-10-CM

## 2016-08-27 DIAGNOSIS — Z5181 Encounter for therapeutic drug level monitoring: Secondary | ICD-10-CM | POA: Diagnosis not present

## 2016-08-27 DIAGNOSIS — M609 Myositis, unspecified: Secondary | ICD-10-CM | POA: Insufficient documentation

## 2016-08-27 DIAGNOSIS — M47817 Spondylosis without myelopathy or radiculopathy, lumbosacral region: Secondary | ICD-10-CM

## 2016-08-27 DIAGNOSIS — F3170 Bipolar disorder, currently in remission, most recent episode unspecified: Secondary | ICD-10-CM

## 2016-08-27 DIAGNOSIS — M791 Myalgia: Secondary | ICD-10-CM | POA: Diagnosis present

## 2016-08-27 DIAGNOSIS — G894 Chronic pain syndrome: Secondary | ICD-10-CM

## 2016-08-27 DIAGNOSIS — M19019 Primary osteoarthritis, unspecified shoulder: Secondary | ICD-10-CM | POA: Diagnosis present

## 2016-08-27 DIAGNOSIS — M129 Arthropathy, unspecified: Secondary | ICD-10-CM | POA: Insufficient documentation

## 2016-08-27 DIAGNOSIS — M12512 Traumatic arthropathy, left shoulder: Secondary | ICD-10-CM | POA: Diagnosis present

## 2016-08-27 DIAGNOSIS — M1711 Unilateral primary osteoarthritis, right knee: Secondary | ICD-10-CM

## 2016-08-27 MED ORDER — OXYCODONE HCL 15 MG PO TABS: 15.0000 mg | ORAL_TABLET | Freq: Three times a day (TID) | ORAL | 0 refills | Status: DC | PRN

## 2016-08-27 NOTE — Progress Notes (Signed)
Subjective:    Patient ID: [name], female    DOB: [birth year], 50 y.o.   MRN: [id number]  HPI: [name] is a 50 year old female who returns for follow up appointment for chronic pain and medication refill. She states her pain is located in her bilateral hands, right shoulder and lower back.She rates her pain 7. Her current exercise regime is walking and she will be resuming her water aerobics next week with a plan to attend three times a week. [name] return Oxycodone prescription printed on 06/23/2016 with a DNF date 07/17/2016.   [name] was admitted to [facility] 07/15/16 and discharged 07/18/2016 for Right Knee Arthroplasty on 07/15/16.  Pain Inventory Average Pain 6 Pain Right Now 7 My pain is burning, stabbing and tingling  In the last 24 hours, has pain interfered with the following? General activity 4 Relation with others 6 Enjoyment of life 7 What TIME of day is your pain at its worst? evening, night Sleep (in general) Poor  Pain is worse with: sitting and standing Pain improves with: rest and medication Relief from Meds: 5  Mobility how many minutes can you walk? 10 ability to climb steps?  yes do you drive?  yes Do you have any goals in this area?  no  Function not employed: date last employed n/a Do you have any goals in this area?  no  Neuro/Psych tremor  Prior Studies Any changes since last visit?  no  Physicians involved in your care Any changes since last visit?  no   Family History  Problem Relation Age of Onset  . Schizophrenia Mother   . Hypertension Mother   . Alzheimer's disease Mother   . Stroke Father   . Diabetes Other   . Hypertension Other    Social History   Social History  . Marital status: Divorced    Spouse name: N/A  . Number of children: N/A  . Years of education: N/A   Social History Main Topics  . Smoking status: Current Every Day Smoker    Packs/day: 1.00    Years: 36.00    Types:  Cigarettes  . Smokeless tobacco: Never Used  . Alcohol use 0.6 oz/week    1 Shots of liquor per week     Comment: occasional  . Drug use: No     Comment: goes to pain clinic  . Sexual activity: Not Asked   Other Topics Concern  . None   Social History Narrative  . None   Past Surgical History:  Procedure Laterality Date  . ABDOMINAL HYSTERECTOMY  1998  . CARDIOVASCULAR STRESS TEST  03/2010   Lexiscan Myoview: EF 55%, mild breast attenuation, no ischemia or scar  . DILATION AND CURETTAGE OF UTERUS  1986   "lost a son"  . esophageal ulcer    . JOINT REPLACEMENT    . KNEE ARTHROSCOPY Right 2005  . TONSILLECTOMY    . TOTAL KNEE ARTHROPLASTY Left 03/04/2016   Procedure: TOTAL KNEE ARTHROPLASTY;  Surgeon: Marchia Bond, MD;  Location: [facility];  Service: Orthopedics;  Laterality: Left;  . TOTAL KNEE ARTHROPLASTY Right 07/15/2016   Procedure: TOTAL KNEE ARTHROPLASTY;  Surgeon: Marchia Bond, MD;  Location: [place];  Service: Orthopedics;  Laterality: Right;  . TOTAL SHOULDER ARTHROPLASTY Left 07/17/2015   Procedure: TOTAL SHOULDER ARTHROPLASTY;  Surgeon: Marchia Bond, MD;  Location: [place];  Service: Orthopedics;  Laterality: Left;  . TRANSTHORACIC ECHOCARDIOGRAM  03/2010   EF  50-55%, normal LV size, no WMAs  . TUBAL LIGATION  1990   Past Medical History:  Diagnosis Date  . Anemia   . Anxiety   . Bipolar 1 disorder ([place])   . Cancer ([place])    uterine CA/- resulted in hysterectomy, pt. reports that she is cured   . COPD (chronic obstructive pulmonary disease) ([place])   . Depression   . Fibromyalgia   . GERD (gastroesophageal reflux disease)   . Headache    migraine's in 20's  . Hypertension   . Insomnia   . Obesity   . Osteoarthritis    knees, elbows   . 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 Pneumonia    developed in 07/2015- post shoulder replacement   . 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 Primary localized osteoarthritis of left knee 03/04/2016    . Primary localized osteoarthritis of right knee 07/15/2016  . PUD (peptic ulcer disease)   . Spinal stenosis    BP 121/81   Pulse (!) 109   Resp 16   SpO2 98%   Opioid Risk Score:   Fall Risk Score:  `1  Depression screen PHQ 2/9  Depression screen [facility] 2/9 01/01/2016 12/10/2015 06/25/2015 05/28/2015 01/02/2015  Decreased Interest 2 0 0 1 1  Down, Depressed, Hopeless 1 0 0 1 1  PHQ - 2 Score 3 0 0 2 2  Altered sleeping 1 - - - 2  Tired, decreased energy 1 - - - 1  Change in appetite 2 - - - 1  Feeling bad or failure about yourself  2 - - - 1  Trouble concentrating 1 - - - 0  Moving slowly or fidgety/restless 1 - - - 0  Suicidal thoughts 0 - - - 0  PHQ-9 Score 11 - - - 7  Difficult doing work/chores Not difficult at all - - - -    Review of Systems  All other systems reviewed and are negative.      Objective:   Physical Exam  Constitutional: She is oriented to person, place, and time. She appears well-developed and well-nourished.  HENT:  Head: Normocephalic and atraumatic.  Neck: Normal range of motion. Neck supple.  Cardiovascular: Normal rate and regular rhythm.   Pulmonary/Chest: Effort normal and breath sounds normal.  Musculoskeletal:  Normal Muscle Bulk and Muscle Testing Reveals: Upper Extremities: Full ROM and Muscle Strength 5/5 Lumbar Paraspinal Tenderness: L-3-L-5 Lower Extremities: Full ROM and Muscle Strength 5/5 Arises from Table with ease Narrow Based Gait  Neurological: She is alert and oriented to person, place, and time.  Skin: Skin is warm and dry.  Psychiatric: She has a normal mood and affect.  Nursing note and vitals reviewed.         Assessment & Plan:  1. Fibromyalgia Syndrome: Continue with exercise and heat therapy. Encouraged to increase activity 2. Osteoarthritis of Bilateral Knees:  Orthopedist FollowingContinue with heat and exercise therapy. S/P Left Knee Arthroplasty on 03/04/16 and S/P Right Knee Arthroplasty on 07/15/2016 by  Dr. Mardelle Matte. 3. Left Shoulder OA: S/PLeft Shoulder Arthroplasty. Orthopedist Following. Dr. Mardelle Matte 4. Bipolar Syndrome: On Depakote and Cymbalta. PCP Following 5. Lumbar Spondylosis: Refilled: Oxycodone 15 mg one tablet three times a day #90.  We will continue the opioid monitoring program, this consists of regular clinic visits, examinations, urine drug screen, pill counts as well as use of [place] Controlled Substance Reporting System. 6. Insomnia: No Complaints continue to monitor. Continue Elavil 7. Muscle Spasms: Continue  Bacolen TID as needed  8. Constipation: Continue Bisacodyl 9. Peripheral Neuropathy: Continue: Lidocaine Patches  20 minutes of face to face patient care time was spent during this visit. All questions were encouraged and answered

## 2016-09-02 LAB — TOXASSURE SELECT,+ANTIDEPR,UR

## 2016-09-11 ENCOUNTER — Other Ambulatory Visit: Payer: Self-pay | Admitting: Registered Nurse

## 2016-09-22 ENCOUNTER — Encounter: Payer: Medicare HMO | Attending: Physical Medicine and Rehabilitation | Admitting: Registered Nurse

## 2016-09-22 ENCOUNTER — Encounter: Payer: Self-pay | Admitting: Registered Nurse

## 2016-09-22 VITALS — BP 132/81 | HR 105 | Resp 14

## 2016-09-22 DIAGNOSIS — M170 Bilateral primary osteoarthritis of knee: Secondary | ICD-10-CM

## 2016-09-22 DIAGNOSIS — M47817 Spondylosis without myelopathy or radiculopathy, lumbosacral region: Secondary | ICD-10-CM

## 2016-09-22 DIAGNOSIS — G894 Chronic pain syndrome: Secondary | ICD-10-CM | POA: Diagnosis not present

## 2016-09-22 DIAGNOSIS — M609 Myositis, unspecified: Secondary | ICD-10-CM | POA: Diagnosis present

## 2016-09-22 DIAGNOSIS — M129 Arthropathy, unspecified: Secondary | ICD-10-CM | POA: Insufficient documentation

## 2016-09-22 DIAGNOSIS — Z5181 Encounter for therapeutic drug level monitoring: Secondary | ICD-10-CM | POA: Diagnosis not present

## 2016-09-22 DIAGNOSIS — M12512 Traumatic arthropathy, left shoulder: Secondary | ICD-10-CM | POA: Insufficient documentation

## 2016-09-22 DIAGNOSIS — M19019 Primary osteoarthritis, unspecified shoulder: Secondary | ICD-10-CM | POA: Diagnosis present

## 2016-09-22 DIAGNOSIS — Z79899 Other long term (current) drug therapy: Secondary | ICD-10-CM | POA: Diagnosis present

## 2016-09-22 DIAGNOSIS — M62838 Other muscle spasm: Secondary | ICD-10-CM

## 2016-09-22 DIAGNOSIS — F3170 Bipolar disorder, currently in remission, most recent episode unspecified: Secondary | ICD-10-CM

## 2016-09-22 DIAGNOSIS — M791 Myalgia: Secondary | ICD-10-CM | POA: Diagnosis present

## 2016-09-22 DIAGNOSIS — M19012 Primary osteoarthritis, left shoulder: Secondary | ICD-10-CM | POA: Diagnosis present

## 2016-09-22 DIAGNOSIS — M1711 Unilateral primary osteoarthritis, right knee: Secondary | ICD-10-CM

## 2016-09-22 MED ORDER — AMITRIPTYLINE HCL 50 MG PO TABS: 50.0000 mg | ORAL_TABLET | Freq: Every day | ORAL | 0 refills | Status: DC

## 2016-09-22 MED ORDER — OXYCODONE HCL 15 MG PO TABS: 15.0000 mg | ORAL_TABLET | Freq: Three times a day (TID) | ORAL | 0 refills | Status: DC | PRN

## 2016-09-22 NOTE — Progress Notes (Signed)
Subjective:    Patient ID: [name], female    DOB: 1966-02-17, 50 y.o.   MRN: [id number]  HPI:  [name] is a 50 year old female who returns for follow up appointmentfor chronic pain and medication refill. She states her pain is located in her neck, left wrist occassionally and lower back radiating into her left lower extremity posteriorly.She rates her pain 5. Her current exercise regime is walking.  Also states she is having insomnia asked if she can be prescribed Belsomra, she was encouraged to ask her PCP for a sleep study, she verbalizes understanding. Will continue to monitor.  Pain Inventory Average Pain 5 Pain Right Now 5 My pain is sharp, stabbing and aching  In the last 24 hours, has pain interfered with the following? General activity 4 Relation with others 6 Enjoyment of life 5 What TIME of day is your pain at its worst? evening Sleep (in general) Poor  Pain is worse with: walking, bending and standing Pain improves with: rest and medication Relief from Meds: 5  Mobility walk without assistance how many minutes can you walk? 30 ability to climb steps?  yes do you drive?  yes Do you have any goals in this area?  yes  Function disabled: date disabled . I need assistance with the following:  household duties Do you have any goals in this area?  yes  Neuro/Psych numbness tremor  Prior Studies Any changes since last visit?  no  Physicians involved in your care Any changes since last visit?  no   Family History  Problem Relation Age of Onset  . Schizophrenia Mother   . Hypertension Mother   . Alzheimer's disease Mother   . Stroke Father   . Diabetes Other   . Hypertension Other    Social History   Social History  . Marital status: Divorced    Spouse name: N/A  . Number of children: N/A  . Years of education: N/A   Social History Main Topics  . Smoking status: Current Every Day Smoker    Packs/day: 1.00    Years: 36.00   Types: Cigarettes  . Smokeless tobacco: Never Used  . Alcohol use 0.6 oz/week    1 Shots of liquor per week     Comment: occasional  . Drug use: No     Comment: goes to pain clinic  . Sexual activity: Not Asked   Other Topics Concern  . None   Social History Narrative  . None   Past Surgical History:  Procedure Laterality Date  . ABDOMINAL HYSTERECTOMY  1998  . CARDIOVASCULAR STRESS TEST  03/2010   Lexiscan Myoview: EF 55%, mild breast attenuation, no ischemia or scar  . DILATION AND CURETTAGE OF UTERUS  1986   "lost a son"  . esophageal ulcer    . JOINT REPLACEMENT    . KNEE ARTHROSCOPY Right 2005  . TONSILLECTOMY    . TOTAL KNEE ARTHROPLASTY Left 03/04/2016   Procedure: TOTAL KNEE ARTHROPLASTY;  Surgeon: Marchia Bond, MD;  Location: [place];  Service: Orthopedics;  Laterality: Left;  . TOTAL KNEE ARTHROPLASTY Right 07/15/2016   Procedure: TOTAL KNEE ARTHROPLASTY;  Surgeon: Marchia Bond, MD;  Location: [place];  Service: Orthopedics;  Laterality: Right;  . TOTAL SHOULDER ARTHROPLASTY Left 07/17/2015   Procedure: TOTAL SHOULDER ARTHROPLASTY;  Surgeon: Marchia Bond, MD;  Location: [place];  Service: Orthopedics;  Laterality: Left;  . TRANSTHORACIC ECHOCARDIOGRAM  03/2010   EF 50-55%, normal LV size,  no WMAs  . TUBAL LIGATION  1990   Past Medical History:  Diagnosis Date  . Anemia   . Anxiety   . Bipolar 1 disorder ([place])   . 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 reports that she is cured   . COPD (chronic obstructive pulmonary disease) ([place])   . Depression   . Fibromyalgia   . GERD (gastroesophageal reflux disease)   . Headache    migraine's in 20's  . Hypertension   . Insomnia   . Obesity   . Osteoarthritis    knees, elbows   . 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 Pneumonia    developed in 07/2015- post shoulder replacement   . 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 Primary localized osteoarthritis of left knee  03/04/2016  . Primary localized osteoarthritis of right knee 07/15/2016  . PUD (peptic ulcer disease)   . Spinal stenosis    There were no vitals taken for this visit.  Opioid Risk Score:   Fall Risk Score:  `1  Depression screen PHQ 2/9  Depression screen [facility] 2/9 01/01/2016 12/10/2015 06/25/2015 05/28/2015 01/02/2015  Decreased Interest 2 0 0 1 1  Down, Depressed, Hopeless 1 0 0 1 1  PHQ - 2 Score 3 0 0 2 2  Altered sleeping 1 - - - 2  Tired, decreased energy 1 - - - 1  Change in appetite 2 - - - 1  Feeling bad or failure about yourself  2 - - - 1  Trouble concentrating 1 - - - 0  Moving slowly or fidgety/restless 1 - - - 0  Suicidal thoughts 0 - - - 0  PHQ-9 Score 11 - - - 7  Difficult doing work/chores Not difficult at all - - - -    Review of Systems  Constitutional: Negative.   HENT: Negative.   Eyes: Negative.   Respiratory: Negative.   Cardiovascular: Negative.   Gastrointestinal: Negative.   Endocrine: Negative.   Genitourinary: Negative.   Musculoskeletal: Positive for back pain.  Skin: Negative.   Allergic/Immunologic: Negative.   Neurological: Positive for tremors and numbness.       Neuropathy  Psychiatric/Behavioral: Negative.   All other systems reviewed and are negative.      Objective:   Physical Exam  Constitutional: She is oriented to person, place, and time. She appears well-developed and well-nourished.  HENT:  Head: Normocephalic and atraumatic.  Neck: Normal range of motion. Neck supple.  Cardiovascular: Normal rate and regular rhythm.   Pulmonary/Chest: Effort normal and breath sounds normal.  Musculoskeletal:  Normal Muscle Bulk and Muscle Testing Reveals: Upper Extremities: Full ROM and Muscle Strength 5/5 Thoracic Paraspinal Tenderness: T-7-T-9 Lumbar Paraspinal Tenderness: L-3-L-5 Lower Extremities: Full ROM and Muscle Strength 5/5 Arises from Table with ease Narrow Based Gait  Neurological: She is alert and oriented to person, place,  and time.  Skin: Skin is warm and dry.  Psychiatric: She has a normal mood and affect.  Nursing note and vitals reviewed.         Assessment & Plan:  1. Fibromyalgia Syndrome: Continue with exercise and heat therapy. Encouraged to increase activity 2. Osteoarthritis of Bilateral Knees:  Orthopedist FollowingContinue with heat and exercise therapy. S/P Left Knee Arthroplasty on 03/04/16 and S/P Right Knee Arthroplasty on 07/15/2016 by Dr. Mardelle Matte. 3. Left Shoulder OA: S/PLeft Shoulder Arthroplasty. Orthopedist Following. Dr. Mardelle Matte 4. Bipolar Syndrome: On Depakote and Cymbalta. PCP Following 5. Lumbar Spondylosis:  Refilled: Oxycodone 15 mg one tablet three times a day #90.  We will continue the opioid monitoring program, this consists of regular clinic visits, examinations, urine drug screen, pill counts as well as use of [place] Controlled Substance Reporting System. 6. Insomnia: Continue Elavil 7. Muscle Spasms: Continue Bacolen TID as needed  8. Constipation: No complaints today. 9. Peripheral Neuropathy: Continue: Lidocaine Patches  20 minutes of face to face patient care time was spent during this visit. All questions were encouraged and answered

## 2016-10-21 ENCOUNTER — Telehealth: Payer: Self-pay

## 2016-10-21 ENCOUNTER — Encounter: Payer: Medicare HMO | Attending: Physical Medicine and Rehabilitation | Admitting: Registered Nurse

## 2016-10-21 ENCOUNTER — Encounter: Payer: Self-pay | Admitting: Registered Nurse

## 2016-10-21 VITALS — BP 134/83 | HR 105

## 2016-10-21 DIAGNOSIS — M791 Myalgia: Secondary | ICD-10-CM | POA: Insufficient documentation

## 2016-10-21 DIAGNOSIS — M129 Arthropathy, unspecified: Secondary | ICD-10-CM | POA: Insufficient documentation

## 2016-10-21 DIAGNOSIS — G894 Chronic pain syndrome: Secondary | ICD-10-CM | POA: Diagnosis not present

## 2016-10-21 DIAGNOSIS — Z5181 Encounter for therapeutic drug level monitoring: Secondary | ICD-10-CM | POA: Diagnosis present

## 2016-10-21 DIAGNOSIS — M170 Bilateral primary osteoarthritis of knee: Secondary | ICD-10-CM

## 2016-10-21 DIAGNOSIS — M609 Myositis, unspecified: Secondary | ICD-10-CM | POA: Insufficient documentation

## 2016-10-21 DIAGNOSIS — M19012 Primary osteoarthritis, left shoulder: Secondary | ICD-10-CM | POA: Diagnosis present

## 2016-10-21 DIAGNOSIS — M12512 Traumatic arthropathy, left shoulder: Secondary | ICD-10-CM | POA: Insufficient documentation

## 2016-10-21 DIAGNOSIS — M19019 Primary osteoarthritis, unspecified shoulder: Secondary | ICD-10-CM | POA: Diagnosis present

## 2016-10-21 DIAGNOSIS — Z79899 Other long term (current) drug therapy: Secondary | ICD-10-CM

## 2016-10-21 DIAGNOSIS — G609 Hereditary and idiopathic neuropathy, unspecified: Secondary | ICD-10-CM

## 2016-10-21 DIAGNOSIS — M47817 Spondylosis without myelopathy or radiculopathy, lumbosacral region: Secondary | ICD-10-CM | POA: Diagnosis present

## 2016-10-21 DIAGNOSIS — M1711 Unilateral primary osteoarthritis, right knee: Secondary | ICD-10-CM

## 2016-10-21 DIAGNOSIS — F3170 Bipolar disorder, currently in remission, most recent episode unspecified: Secondary | ICD-10-CM

## 2016-10-21 MED ORDER — OXYCODONE HCL 15 MG PO TABS: 15.0000 mg | ORAL_TABLET | Freq: Three times a day (TID) | ORAL | 0 refills | Status: DC | PRN

## 2016-10-21 MED ORDER — DULOXETINE HCL 60 MG PO CPEP: ORAL_CAPSULE | ORAL | 3 refills | Status: DC

## 2016-10-21 NOTE — Progress Notes (Signed)
Subjective:    Patient ID: [name], female    DOB: June 17, 1966, 51 y.o.   MRN: [id number]  HPI: [name] is a 51year old female who returns for follow up appointmentfor chronic pain and medication refill. She states her pain is located in her right hand, lower back, and burn areas.She rates her pain 8. Her current exercise regime is walking. Also states last week she was walking in her home carrying a bowl of hot noodles when her dogs became entangled in her foot, she spilled the noodles on her abdomen and  lower extremities. She seen her PCP 10/16/2016. ( Dr. Tammi Klippel)   Pain Inventory Average Pain 7 Pain Right Now 8 My pain is sharp, stabbing and aching  In the last 24 hours, has pain interfered with the following? General activity 5 Relation with others 5 Enjoyment of life 5 What TIME of day is your pain at its worst? evening Sleep (in general) Fair  Pain is worse with: sitting and standing Pain improves with: rest Relief from Meds: 7  Mobility walk without assistance ability to climb steps?  yes do you drive?  yes  Function disabled: date disabled .  Neuro/Psych numbness tremor  Prior Studies Any changes since last visit?  no  Physicians involved in your care Any changes since last visit?  no   Family History  Problem Relation Age of Onset  . Schizophrenia Mother   . Hypertension Mother   . Alzheimer's disease Mother   . Stroke Father   . Diabetes Other   . Hypertension Other    Social History   Social History  . Marital status: Divorced    Spouse name: N/A  . Number of children: N/A  . Years of education: N/A   Social History Main Topics  . Smoking status: Current Every Day Smoker    Packs/day: 1.00    Years: 36.00    Types: Cigarettes  . Smokeless tobacco: Never Used  . Alcohol use 0.6 oz/week    1 Shots of liquor per week     Comment: occasional  . Drug use: No     Comment: goes to pain clinic  . Sexual activity: Not on  file   Other Topics Concern  . Not on file   Social History Narrative  . No narrative on file   Past Surgical History:  Procedure Laterality Date  . ABDOMINAL HYSTERECTOMY  1998  . CARDIOVASCULAR STRESS TEST  03/2010   Lexiscan Myoview: EF 55%, mild breast attenuation, no ischemia or scar  . DILATION AND CURETTAGE OF UTERUS  1986   "lost a son"  . esophageal ulcer    . JOINT REPLACEMENT    . KNEE ARTHROSCOPY Right 2005  . TONSILLECTOMY    . TOTAL KNEE ARTHROPLASTY Left 03/04/2016   Procedure: TOTAL KNEE ARTHROPLASTY;  Surgeon: Marchia Bond, MD;  Location: [place];  Service: Orthopedics;  Laterality: Left;  . TOTAL KNEE ARTHROPLASTY Right 07/15/2016   Procedure: TOTAL KNEE ARTHROPLASTY;  Surgeon: Marchia Bond, MD;  Location: [place];  Service: Orthopedics;  Laterality: Right;  . TOTAL SHOULDER ARTHROPLASTY Left 07/17/2015   Procedure: TOTAL SHOULDER ARTHROPLASTY;  Surgeon: Marchia Bond, MD;  Location: [place];  Service: Orthopedics;  Laterality: Left;  . TRANSTHORACIC ECHOCARDIOGRAM  03/2010   EF 50-55%, normal LV size, no WMAs  . TUBAL LIGATION  1990   Past Medical History:  Diagnosis Date  . Anemia   . Anxiety   . Bipolar 1  disorder ([place])   . 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 reports that she is cured   . COPD (chronic obstructive pulmonary disease) ([place])   . Depression   . Fibromyalgia   . GERD (gastroesophageal reflux disease)   . Headache    migraine's in 20's  . Hypertension   . Insomnia   . Obesity   . Osteoarthritis    knees, elbows   . 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 Pneumonia    developed in 07/2015- post shoulder replacement   . 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 Primary localized osteoarthritis of left knee 03/04/2016  . Primary localized osteoarthritis of right knee 07/15/2016  . PUD (peptic ulcer disease)   . Spinal stenosis    There were no vitals taken for this visit.  Opioid Risk Score:     Fall Risk Score:  `1  Depression screen PHQ 2/9  Depression screen [facility] 2/9 01/01/2016 12/10/2015 06/25/2015 05/28/2015 01/02/2015  Decreased Interest 2 0 0 1 1  Down, Depressed, Hopeless 1 0 0 1 1  PHQ - 2 Score 3 0 0 2 2  Altered sleeping 1 - - - 2  Tired, decreased energy 1 - - - 1  Change in appetite 2 - - - 1  Feeling bad or failure about yourself  2 - - - 1  Trouble concentrating 1 - - - 0  Moving slowly or fidgety/restless 1 - - - 0  Suicidal thoughts 0 - - - 0  PHQ-9 Score 11 - - - 7  Difficult doing work/chores Not difficult at all - - - -   Review of Systems  Constitutional: Negative.   HENT: Negative.   Eyes: Negative.   Respiratory: Negative.   Cardiovascular: Negative.   Gastrointestinal: Negative.   Endocrine: Negative.   Genitourinary: Negative.   Musculoskeletal: Positive for gait problem.  Skin: Positive for wound.  Allergic/Immunologic: Negative.   Neurological: Positive for tremors and numbness.  Hematological: Negative.   Psychiatric/Behavioral: Negative.   All other systems reviewed and are negative.      Objective:   Physical Exam  Constitutional: She is oriented to person, place, and time. She appears well-developed and well-nourished.  HENT:  Head: Normocephalic and atraumatic.  Neck: Normal range of motion. Neck supple.  Cardiovascular: Normal rate and regular rhythm.   Pulmonary/Chest: Effort normal and breath sounds normal.  Abdominal:  Left Upper and Lower Quadrant burns/ Reddened/ No Drainage     Musculoskeletal:  Normal Muscle Bulk and Muscle Testing Reveals: Upper Extremities: Full ROM and Muscle Strength 5/5 Lumbar Paraspinal Tenderness: L-3-L-5 Lower Extremities: Full ROM and Muscle Strength 5/5 Lower Extremities with burns noted/ Reddened Arises from Table with ease Narrow Based Gait  Neurological: She is alert and oriented to person, place, and time.  Skin: Skin is warm and dry.  Psychiatric: She has a normal mood and affect.   Nursing note and vitals reviewed.         Assessment & Plan:  1. Fibromyalgia Syndrome: Continue with exercise and heat therapy. Encouraged to increase activity 2. Osteoarthritis of Bilateral Knees:  Orthopedist FollowingContinue with heat and exercise therapy. S/P Left Knee Arthroplasty on 03/04/16 and S/P Right Knee Arthroplasty on 07/15/2016 by Dr. Mardelle Matte. 3. Left Shoulder OA: S/PLeft Shoulder Arthroplasty. Orthopedist Following. Dr. Mardelle Matte 4. Bipolar Syndrome: On Depakote and Cymbalta. PCP Following 5. Lumbar Spondylosis: Refilled: Oxycodone 15 mg one tablet three times a day #90.  We  will continue the opioid monitoring program, this consists of regular clinic visits, examinations, urine drug screen, pill counts as well as use of [place] Controlled Substance Reporting System. 6. Insomnia: Continue Elavil 7. Muscle Spasms: Continue Bacolen TID as needed  8. Constipation: No complaints today. 9. Peripheral Neuropathy: Continue: Lidocaine Patches  20 minutes of face to face patient care time was spent during this visit. All questions were encouraged and answered

## 2016-10-21 NOTE — Telephone Encounter (Signed)
Patient called requesting a refill on her Cymbalta.  Medication has been refill with 3 Rf Patient has been notified

## 2016-10-23 ENCOUNTER — Telehealth: Payer: Self-pay | Admitting: *Deleted

## 2016-10-23 NOTE — Telephone Encounter (Signed)
Patient is asking for [name] to please call her back

## 2016-10-24 NOTE — Telephone Encounter (Signed)
Return [name] call, answerd all her questions. She verbalizes understanding.

## 2016-10-30 ENCOUNTER — Telehealth: Payer: Self-pay | Admitting: Registered Nurse

## 2016-10-30 NOTE — Telephone Encounter (Signed)
On January 18,2018 NCCSR was reviewed: No conflict was seen on the [place] with Multiple Prescribers. [name] a signed Narcotic Contract with our office. If there were any discrepancies this would have beenreported to her Physcian.

## 2016-11-02 IMAGING — DX DG CHEST 1V PORT
1 series · 1 of 1 positions shown · non-contrast
Comparison: Front and lateral views 02/20/2016

CLINICAL DATA: Shortness of breath.

EXAM:
PORTABLE CHEST 1 VIEW

[chest ap]
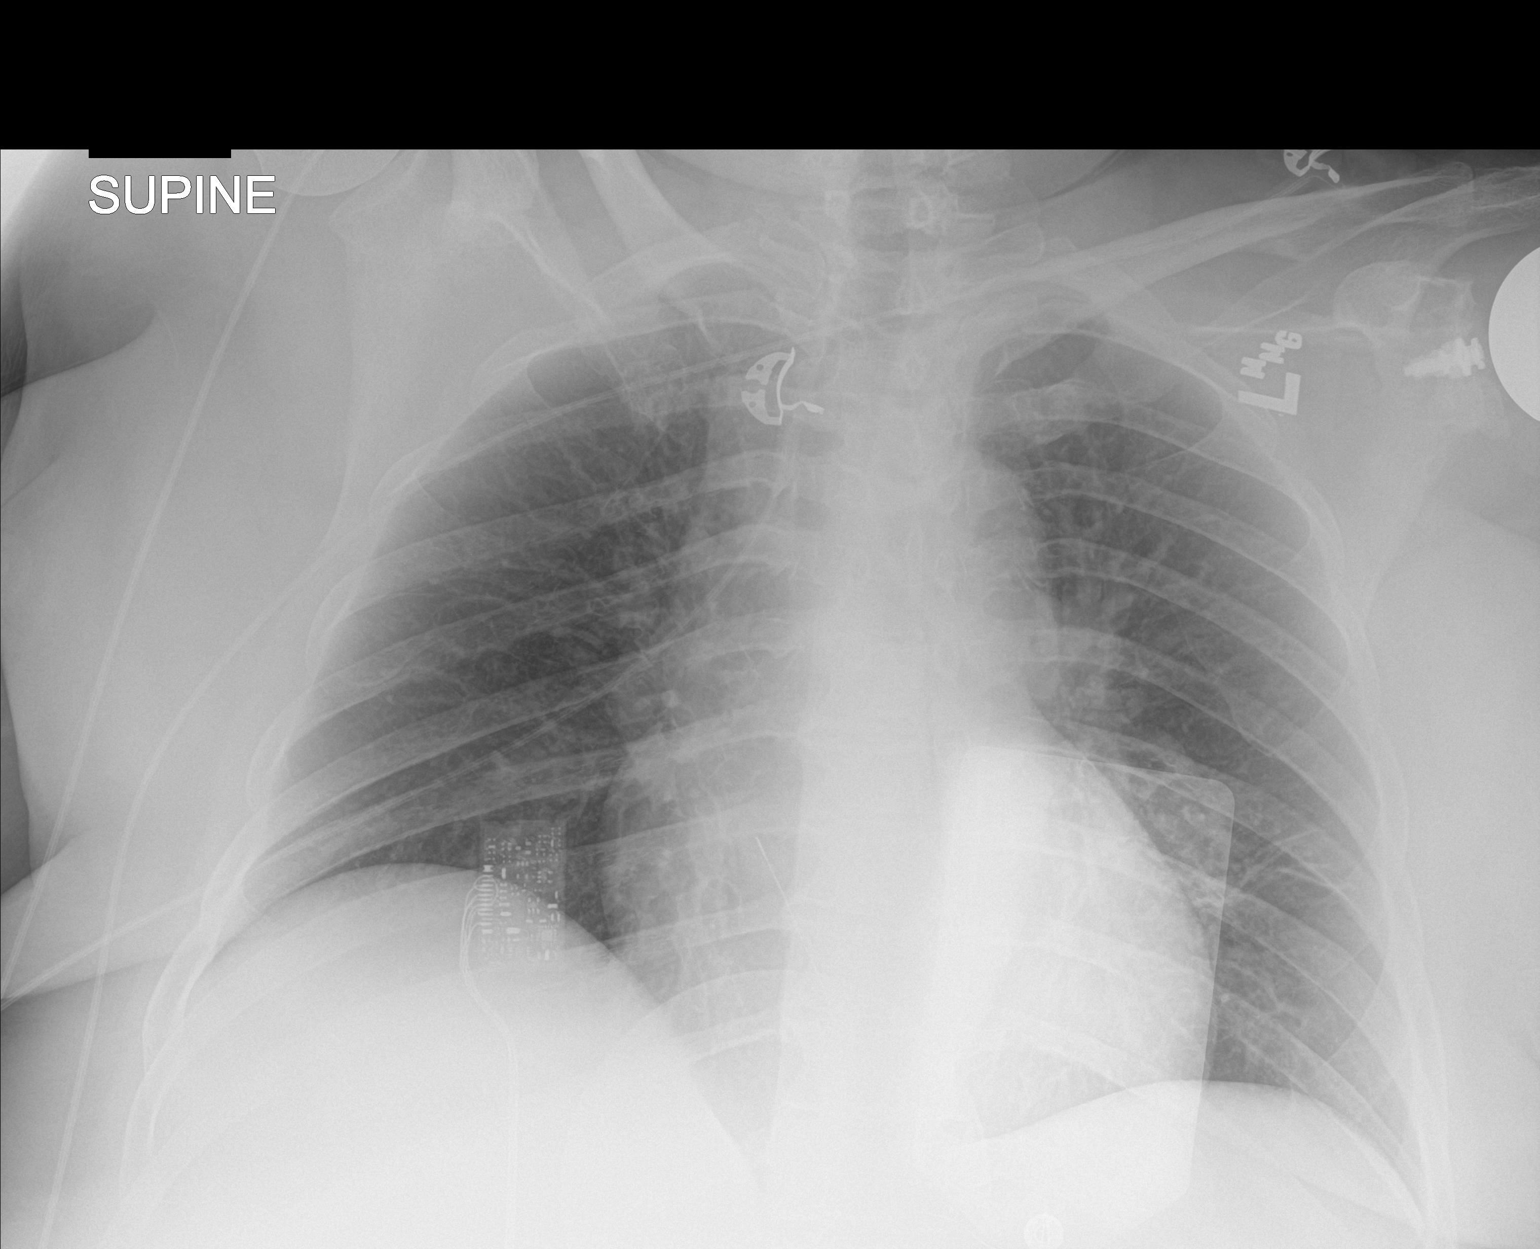

[1 of 1 positions shown; findings below may reference images not displayed]

FINDINGS: Cardiomediastinal contours are unchanged allowing for differences in
technique and positioning. No pulmonary edema, confluent
consolidation, pleural effusion or pneumothorax. Linear atelectasis
or scarring in the left midlung zone. Left humeral head prosthesis,
partially included.
IMPRESSION: No active disease.

## 2016-11-18 ENCOUNTER — Encounter: Payer: Self-pay | Admitting: Registered Nurse

## 2016-11-18 ENCOUNTER — Encounter: Payer: Medicare HMO | Attending: Physical Medicine and Rehabilitation | Admitting: Registered Nurse

## 2016-11-18 VITALS — BP 113/71 | HR 96 | Resp 14

## 2016-11-18 DIAGNOSIS — F3170 Bipolar disorder, currently in remission, most recent episode unspecified: Secondary | ICD-10-CM

## 2016-11-18 DIAGNOSIS — M7711 Lateral epicondylitis, right elbow: Secondary | ICD-10-CM

## 2016-11-18 DIAGNOSIS — M7061 Trochanteric bursitis, right hip: Secondary | ICD-10-CM

## 2016-11-18 DIAGNOSIS — M609 Myositis, unspecified: Secondary | ICD-10-CM | POA: Diagnosis present

## 2016-11-18 DIAGNOSIS — M12512 Traumatic arthropathy, left shoulder: Secondary | ICD-10-CM | POA: Insufficient documentation

## 2016-11-18 DIAGNOSIS — M7062 Trochanteric bursitis, left hip: Secondary | ICD-10-CM

## 2016-11-18 DIAGNOSIS — M19012 Primary osteoarthritis, left shoulder: Secondary | ICD-10-CM | POA: Insufficient documentation

## 2016-11-18 DIAGNOSIS — Z5181 Encounter for therapeutic drug level monitoring: Secondary | ICD-10-CM | POA: Diagnosis present

## 2016-11-18 DIAGNOSIS — M791 Myalgia: Secondary | ICD-10-CM | POA: Diagnosis present

## 2016-11-18 DIAGNOSIS — G894 Chronic pain syndrome: Secondary | ICD-10-CM | POA: Diagnosis not present

## 2016-11-18 DIAGNOSIS — Z79899 Other long term (current) drug therapy: Secondary | ICD-10-CM

## 2016-11-18 DIAGNOSIS — M170 Bilateral primary osteoarthritis of knee: Secondary | ICD-10-CM

## 2016-11-18 DIAGNOSIS — G609 Hereditary and idiopathic neuropathy, unspecified: Secondary | ICD-10-CM

## 2016-11-18 DIAGNOSIS — M47817 Spondylosis without myelopathy or radiculopathy, lumbosacral region: Secondary | ICD-10-CM

## 2016-11-18 DIAGNOSIS — M19019 Primary osteoarthritis, unspecified shoulder: Secondary | ICD-10-CM | POA: Diagnosis present

## 2016-11-18 DIAGNOSIS — M129 Arthropathy, unspecified: Secondary | ICD-10-CM | POA: Diagnosis present

## 2016-11-18 DIAGNOSIS — G4700 Insomnia, unspecified: Secondary | ICD-10-CM

## 2016-11-18 DIAGNOSIS — M62838 Other muscle spasm: Secondary | ICD-10-CM

## 2016-11-18 MED ORDER — METHYLPREDNISOLONE 4 MG PO TBPK: ORAL_TABLET | ORAL | 0 refills | Status: DC

## 2016-11-18 MED ORDER — OXYCODONE HCL 15 MG PO TABS: 15.0000 mg | ORAL_TABLET | Freq: Three times a day (TID) | ORAL | 0 refills | Status: DC | PRN

## 2016-11-18 NOTE — Progress Notes (Signed)
Subjective:    Patient ID: [name], female    DOB: [birth year], 51 y.o.   MRN: [id number]  HPI: [name] is a 51year old female who returns for follow up appointmentfor chronic pain and medication refill. She states her pain is located in her right wrist, right elbow and lower back.She rates her pain 8. Her current exercise regime is walking. [name] states she has been having increase frequency of  [place] Pain also admits she has been under stress with family matters, tearful. Emotional support given we discussed counseling, she refuses at this time due to financial hardship. Denies any suicidal ideation.  Reviewed the narcotic Policy with [name] she was short on her medication three days, [name] admits when her pain was severe she took an extra tablet for a few days. She was instructed to call office and do not self medicate, she realizes if this occurs again this will lead to her being discharge from our office. She verbalizes understanding. We will allow an early refill today, based on the physical examination. She verbalizes understanding.   Pain Inventory Average Pain 8 Pain Right Now 8 My pain is sharp, stabbing and tingling  In the last 24 hours, has pain interfered with the following? General activity 2 Relation with others 2 Enjoyment of life 2 What TIME of day is your pain at its worst? daytime, night  Sleep (in general) Poor  Pain is worse with: walking Pain improves with: rest and medication Relief from Meds: 5  Mobility walk without assistance how many minutes can you walk? 15 ability to climb steps?  yes do you drive?  yes Do you have any goals in this area?  no  Function disabled: date disabled . Do you have any goals in this area?  no  Neuro/Psych numbness tremor spasms depression  Prior Studies Any changes since last visit?  no  Physicians involved in your care Any changes since last visit?  no   Family History  Problem  Relation Age of Onset  . Schizophrenia Mother   . Hypertension Mother   . Alzheimer's disease Mother   . Stroke Father   . Diabetes Other   . Hypertension Other    Social History   Social History  . Marital status: Divorced    Spouse name: N/A  . Number of children: N/A  . Years of education: N/A   Social History Main Topics  . Smoking status: Current Every Day Smoker    Packs/day: 1.00    Years: 36.00    Types: Cigarettes  . Smokeless tobacco: Never Used  . Alcohol use 0.6 oz/week    1 Shots of liquor per week     Comment: occasional  . Drug use: No     Comment: goes to pain clinic  . Sexual activity: Not Asked   Other Topics Concern  . None   Social History Narrative  . None   Past Surgical History:  Procedure Laterality Date  . ABDOMINAL HYSTERECTOMY  1998  . CARDIOVASCULAR STRESS TEST  03/2010   Lexiscan Myoview: EF 55%, mild breast attenuation, no ischemia or scar  . DILATION AND CURETTAGE OF UTERUS  1986   "lost a son"  . esophageal ulcer    . JOINT REPLACEMENT    . KNEE ARTHROSCOPY Right 2005  . TONSILLECTOMY    . TOTAL KNEE ARTHROPLASTY Left 03/04/2016   Procedure: TOTAL KNEE ARTHROPLASTY;  Surgeon: Marchia Bond, MD;  Location: [place];  Service: Orthopedics;  Laterality: Left;  . TOTAL KNEE ARTHROPLASTY Right 07/15/2016   Procedure: TOTAL KNEE ARTHROPLASTY;  Surgeon: Marchia Bond, MD;  Location: [place];  Service: Orthopedics;  Laterality: Right;  . TOTAL SHOULDER ARTHROPLASTY Left 07/17/2015   Procedure: TOTAL SHOULDER ARTHROPLASTY;  Surgeon: Marchia Bond, MD;  Location: [place];  Service: Orthopedics;  Laterality: Left;  . TRANSTHORACIC ECHOCARDIOGRAM  03/2010   EF 50-55%, normal LV size, no WMAs  . TUBAL LIGATION  1990   Past Medical History:  Diagnosis Date  . Anemia   . Anxiety   . Bipolar 1 disorder ([place])   . 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 reports that she is cured   . COPD (chronic obstructive pulmonary disease)  ([place])   . Depression   . Fibromyalgia   . GERD (gastroesophageal reflux disease)   . Headache    migraine's in 20's  . Hypertension   . Insomnia   . Obesity   . Osteoarthritis    knees, elbows   . 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 Pneumonia    developed in 07/2015- post shoulder replacement   . 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 Primary localized osteoarthritis of left knee 03/04/2016  . Primary localized osteoarthritis of right knee 07/15/2016  . PUD (peptic ulcer disease)   . Spinal stenosis    BP 113/71 (BP Location: Right Arm, Patient Position: Sitting, Cuff Size: Large)   Pulse 96   Resp 14   SpO2 97%   Opioid Risk Score:   Fall Risk Score:  `1  Depression screen PHQ 2/9  Depression screen [facility] 2/9 01/01/2016 12/10/2015 06/25/2015 05/28/2015 01/02/2015  Decreased Interest 2 0 0 1 1  Down, Depressed, Hopeless 1 0 0 1 1  PHQ - 2 Score 3 0 0 2 2  Altered sleeping 1 - - - 2  Tired, decreased energy 1 - - - 1  Change in appetite 2 - - - 1  Feeling bad or failure about yourself  2 - - - 1  Trouble concentrating 1 - - - 0  Moving slowly or fidgety/restless 1 - - - 0  Suicidal thoughts 0 - - - 0  PHQ-9 Score 11 - - - 7  Difficult doing work/chores Not difficult at all - - - -    Review of Systems  Constitutional: Negative.   HENT: Negative.   Eyes: Negative.   Respiratory: Positive for shortness of breath.   Gastrointestinal: Positive for diarrhea.  Endocrine: Negative.   Musculoskeletal: Positive for back pain.       Spasms   Skin: Negative.   Neurological: Positive for tremors and numbness.       Tingling  Hematological: Negative.   Psychiatric/Behavioral: Positive for dysphoric mood.  All other systems reviewed and are negative.      Objective:   Physical Exam  Constitutional: She is oriented to person, place, and time. She appears well-developed and well-nourished.  HENT:  Head: Normocephalic and atraumatic.  Neck:  Normal range of motion. Neck supple.  Cardiovascular: Normal rate and regular rhythm.   Pulmonary/Chest: Effort normal and breath sounds normal.  Musculoskeletal:  Normal Muscle Bulk and Muscle Testing Reveals: Upper Extremities: Full ROM and Muscle Strength 5/5 Lateral Epicondyle Tenderness Lumbar Paraspinal Tenderness: L-3-L-5 Bilateral Greater Trochanteric Tenderness Lower Extremities: Full ROM and Muscle Strength 5/5 Right Lower Extremity Flexion Produces Pain into Lumbar Left Lower Extremity Flexion Produces Pain into Groin Arises from  Table Slowly Antalgic Gait   Neurological: She is alert and oriented to person, place, and time.  Skin: Skin is warm and dry.  Psychiatric: She has a normal mood and affect.  Nursing note and vitals reviewed.         Assessment & Plan:  1. Fibromyalgia Syndrome: Continue with exercise and heat therapy. Encouraged to increase activity. 11/18/2016 2. Osteoarthritis of Bilateral Knees: 11/18/2016 Orthopedist FollowingContinue with heat and exercise therapy. S/P Left Knee Arthroplasty on 03/04/16 and S/P Right Knee Arthroplasty on 07/15/2016 by Dr. Mardelle Matte. 3. Left Shoulder OA: S/PLeft Shoulder Arthroplasty. Orthopedist Following. Dr. Mardelle Matte 4. Bipolar Syndrome: On Depakote and Cymbalta. PCP Following. 11/18/2016 5. Lumbar Spondylosis: 11/18/2016 Refilled: Oxycodone 15 mg one tablet three times a day #90.  We will continue the opioid monitoring program, this consists of regular clinic visits, examinations, urine drug screen, pill counts as well as use of [place] Controlled Substance Reporting System. 6. Insomnia: Continue Elavil.11/18/2016 7. Muscle Spasms: Continue Bacolen TID as needed  8. Constipation: No complaints today. 9. Peripheral Neuropathy: Continue: Lidocaine Patches 10. Lateral Epicondylitis Right Elbow: RX: Medrol Dose Pak 11. Greater Trochanteric tenderness: Medrol Dose Pak  30 minutes of face to face patient care time  was spent during this visit. All questions were encouraged and answered.  F/U in 1 month

## 2016-11-25 ENCOUNTER — Other Ambulatory Visit: Payer: Self-pay

## 2016-11-25 MED ORDER — BACLOFEN 10 MG PO TABS: 10.0000 mg | ORAL_TABLET | Freq: Three times a day (TID) | ORAL | 2 refills | Status: DC

## 2016-12-11 ENCOUNTER — Ambulatory Visit: Payer: Medicare HMO | Admitting: Registered Nurse

## 2016-12-12 ENCOUNTER — Encounter: Payer: Self-pay | Admitting: Registered Nurse

## 2016-12-12 ENCOUNTER — Encounter: Payer: Medicare HMO | Attending: Physical Medicine and Rehabilitation | Admitting: Registered Nurse

## 2016-12-12 VITALS — BP 119/88 | HR 124

## 2016-12-12 DIAGNOSIS — M19019 Primary osteoarthritis, unspecified shoulder: Secondary | ICD-10-CM | POA: Diagnosis present

## 2016-12-12 DIAGNOSIS — F3170 Bipolar disorder, currently in remission, most recent episode unspecified: Secondary | ICD-10-CM

## 2016-12-12 DIAGNOSIS — G609 Hereditary and idiopathic neuropathy, unspecified: Secondary | ICD-10-CM

## 2016-12-12 DIAGNOSIS — M19041 Primary osteoarthritis, right hand: Secondary | ICD-10-CM | POA: Diagnosis not present

## 2016-12-12 DIAGNOSIS — M129 Arthropathy, unspecified: Secondary | ICD-10-CM | POA: Diagnosis present

## 2016-12-12 DIAGNOSIS — Z5181 Encounter for therapeutic drug level monitoring: Secondary | ICD-10-CM | POA: Diagnosis present

## 2016-12-12 DIAGNOSIS — M791 Myalgia: Secondary | ICD-10-CM | POA: Insufficient documentation

## 2016-12-12 DIAGNOSIS — G894 Chronic pain syndrome: Secondary | ICD-10-CM

## 2016-12-12 DIAGNOSIS — M170 Bilateral primary osteoarthritis of knee: Secondary | ICD-10-CM

## 2016-12-12 DIAGNOSIS — M62838 Other muscle spasm: Secondary | ICD-10-CM | POA: Diagnosis not present

## 2016-12-12 DIAGNOSIS — M19042 Primary osteoarthritis, left hand: Secondary | ICD-10-CM

## 2016-12-12 DIAGNOSIS — Z79899 Other long term (current) drug therapy: Secondary | ICD-10-CM

## 2016-12-12 DIAGNOSIS — M609 Myositis, unspecified: Secondary | ICD-10-CM | POA: Insufficient documentation

## 2016-12-12 DIAGNOSIS — M12512 Traumatic arthropathy, left shoulder: Secondary | ICD-10-CM | POA: Diagnosis present

## 2016-12-12 DIAGNOSIS — M7062 Trochanteric bursitis, left hip: Secondary | ICD-10-CM

## 2016-12-12 DIAGNOSIS — M47817 Spondylosis without myelopathy or radiculopathy, lumbosacral region: Secondary | ICD-10-CM

## 2016-12-12 DIAGNOSIS — M19021 Primary osteoarthritis, right elbow: Secondary | ICD-10-CM

## 2016-12-12 DIAGNOSIS — M19012 Primary osteoarthritis, left shoulder: Secondary | ICD-10-CM | POA: Diagnosis present

## 2016-12-12 MED ORDER — OXYCODONE HCL 15 MG PO TABS: 15.0000 mg | ORAL_TABLET | Freq: Three times a day (TID) | ORAL | 0 refills | Status: DC | PRN

## 2016-12-12 MED ORDER — DICLOFENAC SODIUM 1 % TD GEL: 2.0000 g | Freq: Three times a day (TID) | TRANSDERMAL | 4 refills | Status: DC

## 2016-12-12 NOTE — Progress Notes (Signed)
Subjective:    Patient ID: [name], female    DOB: 1966/02/28, 51 y.o.   MRN: [id number]  HPI:  [name] is a 51year old female who returns for follow up appointmentfor chronic pain and medication refill. She states her pain is located in her bilateral hands, right wrist, right elbow, lower back and left hip..She rates her pain 7. Her current exercise regime is walking.   Pain Inventory Average Pain 6 Pain Right Now 7 My pain is burning, stabbing and tingling  In the last 24 hours, has pain interfered with the following? General activity 5 Relation with others 7 Enjoyment of life 6 What TIME of day is your pain at its worst? night Sleep (in general) Fair  Pain is worse with: walking and standing Pain improves with: heat/ice Relief from Meds: 6  Mobility walk without assistance ability to climb steps?  yes do you drive?  yes  Function disabled: date disabled . Do you have any goals in this area?  no  Neuro/Psych tingling trouble walking  Prior Studies Any changes since last visit?  no  Physicians involved in your care Any changes since last visit?  no   Family History  Problem Relation Age of Onset  . Schizophrenia Mother   . Hypertension Mother   . Alzheimer's disease Mother   . Stroke Father   . Diabetes Other   . Hypertension Other    Social History   Social History  . Marital status: Divorced    Spouse name: N/A  . Number of children: N/A  . Years of education: N/A   Social History Main Topics  . Smoking status: Current Every Day Smoker    Packs/day: 1.00    Years: 36.00    Types: Cigarettes  . Smokeless tobacco: Never Used  . Alcohol use 0.6 oz/week    1 Shots of liquor per week     Comment: occasional  . Drug use: No     Comment: goes to pain clinic  . Sexual activity: Not on file   Other Topics Concern  . Not on file   Social History Narrative  . No narrative on file   Past Surgical History:  Procedure  Laterality Date  . ABDOMINAL HYSTERECTOMY  1998  . CARDIOVASCULAR STRESS TEST  03/2010   Lexiscan Myoview: EF 55%, mild breast attenuation, no ischemia or scar  . DILATION AND CURETTAGE OF UTERUS  1986   "lost a son"  . esophageal ulcer    . JOINT REPLACEMENT    . KNEE ARTHROSCOPY Right 2005  . TONSILLECTOMY    . TOTAL KNEE ARTHROPLASTY Left 03/04/2016   Procedure: TOTAL KNEE ARTHROPLASTY;  Surgeon: Marchia Bond, MD;  Location: [place];  Service: Orthopedics;  Laterality: Left;  . TOTAL KNEE ARTHROPLASTY Right 07/15/2016   Procedure: TOTAL KNEE ARTHROPLASTY;  Surgeon: Marchia Bond, MD;  Location: [place];  Service: Orthopedics;  Laterality: Right;  . TOTAL SHOULDER ARTHROPLASTY Left 07/17/2015   Procedure: TOTAL SHOULDER ARTHROPLASTY;  Surgeon: Marchia Bond, MD;  Location: [place];  Service: Orthopedics;  Laterality: Left;  . TRANSTHORACIC ECHOCARDIOGRAM  03/2010   EF 50-55%, normal LV size, no WMAs  . TUBAL LIGATION  1990   Past Medical History:  Diagnosis Date  . Anemia   . Anxiety   . Bipolar 1 disorder ([place])   . 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 reports that she is cured   . COPD (chronic obstructive  pulmonary disease) ([place])   . Depression   . Fibromyalgia   . GERD (gastroesophageal reflux disease)   . Headache    migraine's in 20's  . Hypertension   . Insomnia   . Obesity   . Osteoarthritis    knees, elbows   . 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 Pneumonia    developed in 07/2015- post shoulder replacement   . 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 Primary localized osteoarthritis of left knee 03/04/2016  . Primary localized osteoarthritis of right knee 07/15/2016  . PUD (peptic ulcer disease)   . Spinal stenosis    There were no vitals taken for this visit.  Opioid Risk Score:   Fall Risk Score:  `1  Depression screen PHQ 2/9  Depression screen [facility] 2/9 01/01/2016 12/10/2015 06/25/2015 05/28/2015 01/02/2015    Decreased Interest 2 0 0 1 1  Down, Depressed, Hopeless 1 0 0 1 1  PHQ - 2 Score 3 0 0 2 2  Altered sleeping 1 - - - 2  Tired, decreased energy 1 - - - 1  Change in appetite 2 - - - 1  Feeling bad or failure about yourself  2 - - - 1  Trouble concentrating 1 - - - 0  Moving slowly or fidgety/restless 1 - - - 0  Suicidal thoughts 0 - - - 0  PHQ-9 Score 11 - - - 7  Difficult doing work/chores Not difficult at all - - - -    Review of Systems  Constitutional: Negative.   HENT: Negative.   Eyes: Negative.   Respiratory: Negative.   Cardiovascular: Negative.   Gastrointestinal: Negative.   Endocrine: Negative.   Genitourinary: Negative.   Musculoskeletal: Positive for gait problem.  Skin: Negative.   Allergic/Immunologic: Negative.   Hematological: Negative.   Psychiatric/Behavioral: Negative.   All other systems reviewed and are negative.      Objective:   Physical Exam  Constitutional: She is oriented to person, place, and time. She appears well-developed and well-nourished.  HENT:  Head: Normocephalic and atraumatic.  Neck: Normal range of motion. Neck supple.  Cardiovascular: Normal rate and regular rhythm.   Pulmonary/Chest: Effort normal and breath sounds normal.  Musculoskeletal:  Normal Muscle Bulk and Muscle Testing Reveals:  Upper Extremities: Full ROM and Muscle Strength 5/5 Right Hand with Tenderness Right Wrist with inflammation with Tenderness Right Elbow with inflammation and Tenderness Lumbar Paraspinal Tenderness: L-3-L-5 Left Greater Trochanteric Tenderness Lower Extremities: Full ROM and Muscle Strength 5/5 Arises from Table slowly Narrow Based Gait  Neurological: She is alert and oriented to person, place, and time.  Skin: Skin is warm and dry.  Psychiatric: She has a normal mood and affect.  Nursing note and vitals reviewed.         Assessment & Plan:  1. Fibromyalgia Syndrome: Continue with exercise and heat therapy. Encouraged to  increase activity. 12/12/2016 2. Osteoarthritis of Bilateral Knees: 12/12/2016 Orthopedist FollowingContinue with heat and exercise therapy. S/P Left Knee Arthroplasty on 03/04/16 and S/P Right Knee Arthroplasty on 07/15/2016 by Dr. Mardelle Matte. 3. Left Shoulder OA: S/PLeft Shoulder Arthroplasty. Orthopedist Following. Dr. Mardelle Matte. 12/12/2016 4. Bipolar Syndrome: On Depakote and Cymbalta. PCP Following. 12/12/2016 5. Lumbar Spondylosis: 12/12/2016 Refilled: Oxycodone 15 mg one tablet three times a day #90.  We will continue the opioid monitoring program, this consists of regular clinic visits, examinations, urine drug screen, pill counts as well as use of [place] Controlled Substance Reporting System. 6. Insomnia:  Continue Elavil.12/12/2016 7. Muscle Spasms: Continue Bacolen TID as needed. 12/12/2016 8. Constipation: No complaints today.12/12/2016 9. Peripheral Neuropathy: Continue: Lidocaine Patches. 12/12/2016 10. Left Greater Trochanteric Tenderness: Continue with Heat and Ice Therapy 11.Bilateral OA of Both Hands/ Right Elbow:  RX: Voltaren Gel  30 minutes of face to face patient care time was spent during this visit. All questions were encouraged and answered.  F/U in 1 month

## 2017-01-02 ENCOUNTER — Other Ambulatory Visit: Payer: Self-pay | Admitting: Registered Nurse

## 2017-01-12 ENCOUNTER — Encounter: Payer: Self-pay | Admitting: Registered Nurse

## 2017-01-12 ENCOUNTER — Encounter: Payer: Medicare HMO | Attending: Physical Medicine and Rehabilitation | Admitting: Registered Nurse

## 2017-01-12 ENCOUNTER — Telehealth: Payer: Self-pay | Admitting: Registered Nurse

## 2017-01-12 VITALS — BP 159/77 | HR 112

## 2017-01-12 DIAGNOSIS — M129 Arthropathy, unspecified: Secondary | ICD-10-CM | POA: Diagnosis present

## 2017-01-12 DIAGNOSIS — M19012 Primary osteoarthritis, left shoulder: Secondary | ICD-10-CM | POA: Diagnosis present

## 2017-01-12 DIAGNOSIS — M791 Myalgia: Secondary | ICD-10-CM | POA: Diagnosis present

## 2017-01-12 DIAGNOSIS — M609 Myositis, unspecified: Secondary | ICD-10-CM | POA: Diagnosis present

## 2017-01-12 DIAGNOSIS — M170 Bilateral primary osteoarthritis of knee: Secondary | ICD-10-CM

## 2017-01-12 DIAGNOSIS — G609 Hereditary and idiopathic neuropathy, unspecified: Secondary | ICD-10-CM | POA: Diagnosis not present

## 2017-01-12 DIAGNOSIS — F3170 Bipolar disorder, currently in remission, most recent episode unspecified: Secondary | ICD-10-CM

## 2017-01-12 DIAGNOSIS — M7062 Trochanteric bursitis, left hip: Secondary | ICD-10-CM | POA: Diagnosis not present

## 2017-01-12 DIAGNOSIS — M19019 Primary osteoarthritis, unspecified shoulder: Secondary | ICD-10-CM | POA: Diagnosis present

## 2017-01-12 DIAGNOSIS — Z79899 Other long term (current) drug therapy: Secondary | ICD-10-CM | POA: Diagnosis not present

## 2017-01-12 DIAGNOSIS — M12512 Traumatic arthropathy, left shoulder: Secondary | ICD-10-CM | POA: Diagnosis present

## 2017-01-12 DIAGNOSIS — Z5181 Encounter for therapeutic drug level monitoring: Secondary | ICD-10-CM

## 2017-01-12 DIAGNOSIS — M47817 Spondylosis without myelopathy or radiculopathy, lumbosacral region: Secondary | ICD-10-CM

## 2017-01-12 DIAGNOSIS — M62838 Other muscle spasm: Secondary | ICD-10-CM

## 2017-01-12 DIAGNOSIS — G894 Chronic pain syndrome: Secondary | ICD-10-CM

## 2017-01-12 MED ORDER — OXYCODONE HCL 15 MG PO TABS: 15.0000 mg | ORAL_TABLET | Freq: Three times a day (TID) | ORAL | 0 refills | Status: DC | PRN

## 2017-01-12 NOTE — Progress Notes (Signed)
Subjective:    Patient ID: [name], female    DOB: October 03, 1966, 51 y.o.   MRN: [id number]  HPI: [name] is a 51year old female who returns for follow up appointmentfor chronic pain and medication refill. She states her pain is located in her lower back and left hip.She rates her pain 6. Her current exercise regime is walking.  Also states last Saturday 01/10/2017 she was trying to catch her dog who went outside and slipped on the snow. She landed on her left side, she picked herself up. She didn't seek medical attention. Also states she is awaiting scheduled appointment for Bariatric Surgery with Dr. Toney Walter at [facility].  Pain Inventory Average Pain 6 Pain Right Now 6 My pain is burning, stabbing and aching  In the last 24 hours, has pain interfered with the following? General activity 5 Relation with others 6 Enjoyment of life 6 What TIME of day is your pain at its worst? evening Sleep (in general) Poor  Pain is worse with: walking, sitting and standing Pain improves with: rest and heat/ice Relief from Meds: .  Mobility walk without assistance ability to climb steps?  yes do you drive?  yes  Function disabled: date disabled .  Neuro/Psych bladder control problems  Prior Studies Any changes since last visit?  no  Physicians involved in your care Any changes since last visit?  no   Family History  Problem Relation Age of Onset  . Schizophrenia Mother   . Hypertension Mother   . Alzheimer's disease Mother   . Stroke Father   . Diabetes Other   . Hypertension Other    Social History   Social History  . Marital status: Divorced    Spouse name: N/A  . Number of children: N/A  . Years of education: N/A   Social History Main Topics  . Smoking status: Current Every Day Smoker    Packs/day: 1.00    Years: 36.00    Types: Cigarettes  . Smokeless tobacco: Never Used  . Alcohol use 0.6 oz/week    1 Shots of liquor per week    Comment: occasional  . Drug use: No     Comment: goes to pain clinic  . Sexual activity: Not on file   Other Topics Concern  . Not on file   Social History Narrative  . No narrative on file   Past Surgical History:  Procedure Laterality Date  . ABDOMINAL HYSTERECTOMY  1998  . CARDIOVASCULAR STRESS TEST  03/2010   Lexiscan Myoview: EF 55%, mild breast attenuation, no ischemia or scar  . DILATION AND CURETTAGE OF UTERUS  1986   "lost a son"  . esophageal ulcer    . JOINT REPLACEMENT    . KNEE ARTHROSCOPY Right 2005  . TONSILLECTOMY    . TOTAL KNEE ARTHROPLASTY Left 03/04/2016   Procedure: TOTAL KNEE ARTHROPLASTY;  Surgeon: Joy Bond, MD;  Location: [place];  Service: Orthopedics;  Laterality: Left;  . TOTAL KNEE ARTHROPLASTY Right 07/15/2016   Procedure: TOTAL KNEE ARTHROPLASTY;  Surgeon: Joy Bond, MD;  Location: [place];  Service: Orthopedics;  Laterality: Right;  . TOTAL SHOULDER ARTHROPLASTY Left 07/17/2015   Procedure: TOTAL SHOULDER ARTHROPLASTY;  Surgeon: Joy Bond, MD;  Location: [place];  Service: Orthopedics;  Laterality: Left;  . TRANSTHORACIC ECHOCARDIOGRAM  03/2010   EF 50-55%, normal LV size, no WMAs  . TUBAL LIGATION  1990   Past Medical History:  Diagnosis Date  . Anemia   .  Anxiety   . Bipolar 1 disorder ([place])   . 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 reports that she is cured   . COPD (chronic obstructive pulmonary disease) ([place])   . Depression   . Fibromyalgia   . GERD (gastroesophageal reflux disease)   . Headache    migraine's in 20's  . Hypertension   . Insomnia   . Obesity   . Osteoarthritis    knees, elbows   . 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 Pneumonia    developed in 07/2015- post shoulder replacement   . 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 Primary localized osteoarthritis of left knee 03/04/2016  . Primary localized osteoarthritis of right knee 07/15/2016  . PUD (peptic ulcer  disease)   . Spinal stenosis    There were no vitals taken for this visit.  Opioid Risk Score:   Fall Risk Score:  `1  Depression screen PHQ 2/9  Depression screen [facility] 2/9 01/01/2016 12/10/2015 06/25/2015 05/28/2015 01/02/2015  Decreased Interest 2 0 0 1 1  Down, Depressed, Hopeless 1 0 0 1 1  PHQ - 2 Score 3 0 0 2 2  Altered sleeping 1 - - - 2  Tired, decreased energy 1 - - - 1  Change in appetite 2 - - - 1  Feeling bad or failure about yourself  2 - - - 1  Trouble concentrating 1 - - - 0  Moving slowly or fidgety/restless 1 - - - 0  Suicidal thoughts 0 - - - 0  PHQ-9 Score 11 - - - 7  Difficult doing work/chores Not difficult at all - - - -    Review of Systems  Constitutional: Negative.   HENT: Negative.   Eyes: Negative.   Respiratory: Negative.   Cardiovascular: Negative.   Gastrointestinal: Negative.   Endocrine: Negative.   Genitourinary: Negative.   Musculoskeletal: Negative.   Skin: Negative.   Allergic/Immunologic: Negative.   Neurological: Negative.   Hematological: Negative.   Psychiatric/Behavioral: Negative.   All other systems reviewed and are negative.      Objective:   Physical Exam  Constitutional: She is oriented to person, place, and time. She appears well-developed and well-nourished.  HENT:  Head: Normocephalic and atraumatic.  Neck: Normal range of motion. Neck supple.  Cardiovascular: Normal rate and regular rhythm.   Pulmonary/Chest: Effort normal and breath sounds normal.  Musculoskeletal:  Normal Muscle Bulk and Muscle Testing Reveals: Upper Extremities: Full ROM and Muscle Strength 5/5 Lumbar Paraspinal Tenderness: L-3-L-5 Lower Extremities: Full ROM and Muscle Strength 5/5 Arises from Table with ease Narrow Based Gait  Neurological: She is alert and oriented to person, place, and time.  Skin: Skin is warm and dry.  Psychiatric: She has a normal mood and affect.  Nursing note and vitals reviewed.         Assessment & Plan:    1. Fibromyalgia Syndrome: Continue with exercise and heat therapy. Encouraged to increase activity as tolerated. 01/12/2017 2. Osteoarthritis of Bilateral Knees: 01/12/2017 Orthopedist FollowingContinue with heat and exercise therapy. S/P Left Knee Arthroplasty on 03/04/16 and S/P Right Knee Arthroplasty on 07/15/2016 by Dr. Mardelle Matte. 3. Left Shoulder OA: S/PLeft Shoulder Arthroplasty. Orthopedist Following. Dr. Mardelle Matte. 01/12/2017 4. Bipolar Syndrome: On Depakote and Cymbalta. PCP Following. 01/12/2017 5. Lumbar Spondylosis: 01/12/2017 Refilled: Oxycodone 15 mg one tablet three times a day #90.  We will continue the opioid monitoring program, this consists of regular clinic visits, examinations, urine  drug screen, pill counts as well as use of [place] Controlled Substance Reporting System. 6. Muscle Spasms: Continue Bacolen TID as needed. 12/12/2016 7. Constipation: No complaints today.01/12/2017 8. Peripheral Neuropathy: Continue: Lidocaine Patches. 12/12/2016 9. Left Greater Trochanteric Tenderness: Continue with Heat and Ice Therapy 10.Bilateral OA of Both Hands/ Right Elbow:  Continue: Voltaren Gel  20 minutes of face to face patient care time was spent during this visit. All questions were encouraged and answered.  F/U in 1 month

## 2017-01-12 NOTE — Telephone Encounter (Signed)
On 01/12/2017 the  [place] was reviewed no conflict was seen on the [place] with multiple prescribers. Ms. Bognar has a signed narcotic contract with our office. If there were any discrepancies this would have been reported to her physician.

## 2017-01-15 LAB — TOXASSURE SELECT,+ANTIDEPR,UR

## 2017-01-26 ENCOUNTER — Telehealth: Payer: Self-pay | Admitting: *Deleted

## 2017-01-26 NOTE — Telephone Encounter (Signed)
Urine drug screen for this 01/12/17 encounter is consistent for prescribed medication.

## 2017-01-27 ENCOUNTER — Other Ambulatory Visit: Payer: Self-pay | Admitting: Registered Nurse

## 2017-02-11 ENCOUNTER — Encounter: Payer: Self-pay | Admitting: Physical Medicine & Rehabilitation

## 2017-02-11 ENCOUNTER — Encounter: Payer: Medicare HMO | Attending: Physical Medicine and Rehabilitation | Admitting: Physical Medicine & Rehabilitation

## 2017-02-11 VITALS — BP 141/97 | HR 111

## 2017-02-11 DIAGNOSIS — M791 Myalgia: Secondary | ICD-10-CM | POA: Diagnosis present

## 2017-02-11 DIAGNOSIS — G894 Chronic pain syndrome: Secondary | ICD-10-CM | POA: Diagnosis not present

## 2017-02-11 DIAGNOSIS — M172 Bilateral post-traumatic osteoarthritis of knee: Secondary | ICD-10-CM

## 2017-02-11 DIAGNOSIS — M7521 Bicipital tendinitis, right shoulder: Secondary | ICD-10-CM | POA: Diagnosis not present

## 2017-02-11 DIAGNOSIS — M47817 Spondylosis without myelopathy or radiculopathy, lumbosacral region: Secondary | ICD-10-CM | POA: Diagnosis not present

## 2017-02-11 DIAGNOSIS — M609 Myositis, unspecified: Secondary | ICD-10-CM | POA: Insufficient documentation

## 2017-02-11 DIAGNOSIS — M12512 Traumatic arthropathy, left shoulder: Secondary | ICD-10-CM | POA: Diagnosis present

## 2017-02-11 DIAGNOSIS — Z5181 Encounter for therapeutic drug level monitoring: Secondary | ICD-10-CM

## 2017-02-11 DIAGNOSIS — Z79899 Other long term (current) drug therapy: Secondary | ICD-10-CM | POA: Diagnosis not present

## 2017-02-11 DIAGNOSIS — M129 Arthropathy, unspecified: Secondary | ICD-10-CM | POA: Diagnosis present

## 2017-02-11 DIAGNOSIS — M19012 Primary osteoarthritis, left shoulder: Secondary | ICD-10-CM | POA: Insufficient documentation

## 2017-02-11 DIAGNOSIS — F3170 Bipolar disorder, currently in remission, most recent episode unspecified: Secondary | ICD-10-CM

## 2017-02-11 DIAGNOSIS — M19019 Primary osteoarthritis, unspecified shoulder: Secondary | ICD-10-CM | POA: Diagnosis present

## 2017-02-11 MED ORDER — OXYCODONE HCL 15 MG PO TABS: 15.0000 mg | ORAL_TABLET | Freq: Three times a day (TID) | ORAL | 0 refills | Status: DC | PRN

## 2017-02-11 NOTE — Progress Notes (Signed)
Subjective:    Patient ID: [name], female    DOB: 26-Feb-1966, 51 y.o.   MRN: [id number]  HPI   [name] is here in follow up of her chronic pain. She had her right knee replaced in October and has done really well with that. She presents today with increased right shoulder pain. The right shoulder has been bothering her for some time but over the last 3 months it has increased to the point where she had a hard time using the right arm. The pain is primarily anterior. She has used heat/ice with variable effects.   Her back continues to bother her but has been fairly stable. The oxycodone seems to help control this.   She has been under additional stress due to her mother's illness. She is hospitalized currently.       Pain Inventory Average Pain 6 Pain Right Now 7 My pain is burning, stabbing and aching  In the last 24 hours, has pain interfered with the following? General activity 6 Relation with others 7 Enjoyment of life 7 What TIME of day is your pain at its worst? evening Sleep (in general) Fair  Pain is worse with: walking, sitting and some activites Pain improves with: rest and medication Relief from Meds: 9  Mobility walk without assistance ability to climb steps?  yes do you drive?  yes  Function disabled: date disabled .  Neuro/Psych bladder control problems  Prior Studies Any changes since last visit?  no  Physicians involved in your care Any changes since last visit?  no   Family History  Problem Relation Age of Onset  . Schizophrenia Mother   . Hypertension Mother   . Alzheimer's disease Mother   . Stroke Father   . Diabetes Other   . Hypertension Other    Social History   Social History  . Marital status: Divorced    Spouse name: N/A  . Number of children: N/A  . Years of education: N/A   Social History Main Topics  . Smoking status: Current Every Day Smoker    Packs/day: 1.00    Years: 36.00    Types: Cigarettes  .  Smokeless tobacco: Never Used  . Alcohol use 0.6 oz/week    1 Shots of liquor per week     Comment: occasional  . Drug use: No     Comment: goes to pain clinic  . Sexual activity: Not Asked   Other Topics Concern  . None   Social History Narrative  . None   Past Surgical History:  Procedure Laterality Date  . ABDOMINAL HYSTERECTOMY  1998  . CARDIOVASCULAR STRESS TEST  03/2010   Lexiscan Myoview: EF 55%, mild breast attenuation, no ischemia or scar  . DILATION AND CURETTAGE OF UTERUS  1986   "lost a son"  . esophageal ulcer    . JOINT REPLACEMENT    . KNEE ARTHROSCOPY Right 2005  . TONSILLECTOMY    . TOTAL KNEE ARTHROPLASTY Left 03/04/2016   Procedure: TOTAL KNEE ARTHROPLASTY;  Surgeon: Marchia Bond, MD;  Location: [place];  Service: Orthopedics;  Laterality: Left;  . TOTAL KNEE ARTHROPLASTY Right 07/15/2016   Procedure: TOTAL KNEE ARTHROPLASTY;  Surgeon: Marchia Bond, MD;  Location: [place];  Service: Orthopedics;  Laterality: Right;  . TOTAL SHOULDER ARTHROPLASTY Left 07/17/2015   Procedure: TOTAL SHOULDER ARTHROPLASTY;  Surgeon: Marchia Bond, MD;  Location: [place];  Service: Orthopedics;  Laterality: Left;  . TRANSTHORACIC ECHOCARDIOGRAM  03/2010   EF  50-55%, normal LV size, no WMAs  . TUBAL LIGATION  1990   Past Medical History:  Diagnosis Date  . Anemia   . Anxiety   . Bipolar 1 disorder ([place])   . 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 reports that she is cured   . COPD (chronic obstructive pulmonary disease) ([place])   . Depression   . Fibromyalgia   . GERD (gastroesophageal reflux disease)   . Headache    migraine's in 20's  . Hypertension   . Insomnia   . Obesity   . Osteoarthritis    knees, elbows   . 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 Pneumonia    developed in 07/2015- post shoulder replacement   . 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 Primary localized osteoarthritis of left knee 03/04/2016  . Primary  localized osteoarthritis of right knee 07/15/2016  . PUD (peptic ulcer disease)   . Spinal stenosis    BP (!) 141/97   Pulse (!) 111   SpO2 97%   Opioid Risk Score:   Fall Risk Score:  `1  Depression screen PHQ 2/9  Depression screen [facility] 2/9 01/01/2016 12/10/2015 06/25/2015 05/28/2015 01/02/2015  Decreased Interest 2 0 0 1 1  Down, Depressed, Hopeless 1 0 0 1 1  PHQ - 2 Score 3 0 0 2 2  Altered sleeping 1 - - - 2  Tired, decreased energy 1 - - - 1  Change in appetite 2 - - - 1  Feeling bad or failure about yourself  2 - - - 1  Trouble concentrating 1 - - - 0  Moving slowly or fidgety/restless 1 - - - 0  Suicidal thoughts 0 - - - 0  PHQ-9 Score 11 - - - 7  Difficult doing work/chores Not difficult at all - - - -    Review of Systems  Constitutional: Positive for unexpected weight change.  HENT: Negative.   Eyes: Negative.   Respiratory: Negative.   Cardiovascular: Negative.   Gastrointestinal: Negative.   Endocrine: Negative.   Genitourinary: Negative.   Musculoskeletal: Negative.   Skin: Negative.   Allergic/Immunologic: Negative.   Neurological: Negative.   Hematological: Negative.   Psychiatric/Behavioral: Negative.   All other systems reviewed and are negative.      Objective:   Physical Exam  Constitutional: She appears well-developed and well-nourished.  HENT:  Head: Normocephalic and atraumatic.  Neck: Normal range of motion.  Cardiovascular: RRR  Pulmonary/Chest: normal effot.  Musculoskeletal:  Normal Muscle Bulk and Muscle Testing Reveals: Upper Extremities: Full ROM and Muscle Strength 5/5 Lumbar Paraspinal Tenderness: L-3- L-5 Lower Extremities: Full ROM and Muscle Strength 5/5 -gait improved. Knees alligned. Right shoulder tender with palpation of short head biceps tendon. speeds test +. RTC maneuver negative.  Neurological: She is alert.  Skin: Skin is warm and dry.  Psychiatric: She has a normal mood and affect.  Nursing note and vitals  reviewed.       Assessment & Plan:  1. Fibromyalgia Syndrome:   -maintain activity as tolerated 2. Osteoarthritis of Bilateral Knees:    S/P: Left Knee Arthroplasty on 03/04/16 and right TKA 10/17 with Dr. Mardelle Matte 3. Left Shoulder OA: S/PLeft Shoulder Arthroplasty.per Dr. Mardelle Matte 4. Bipolar Syndrome: On Depakote and Cymbalta.              -doing fairly well in this area 5. Lumbar Spondylosis: Refilled: Oxycodone 15 mg one tablet three times a day #90.              -  We will continue the opioid monitoring program, this consists of regular clinic visits, examinations, urine drug screen, pill counts as well as use of [place] Controlled Substance Reporting System. [place] today.  6. Insomnia: improved  7. Muscle Spasms: Continue Bacolen TID as needed  8. Right shoulder pain most c/w biceps tendonitis (short head)  -After informed consent and preparation of the skin with betadine and isopropyl alcohol, I injected 6mg  (1cc) of celestone and 4cc of 1% lidocaine around the short head biceps tendon via lateral approach. Additionally, aspiration was performed prior to injection. The patient tolerated well, and no complications were encountered. Afterward the area was cleaned and dressed. Post- injection instructions were provided.   9. Peripheral Neuropathy: Continue: Lidocaine Patches  15 minutes of face to face patient care time was spent during this visit. All questions were encouraged and answered. NP to  follow up with patient next month

## 2017-02-11 NOTE — Patient Instructions (Signed)
PLEASE FEEL FREE TO CALL OUR OFFICE WITH ANY PROBLEMS OR QUESTIONS ([phone])      

## 2017-02-20 ENCOUNTER — Other Ambulatory Visit: Payer: Self-pay | Admitting: Surgical Oncology

## 2017-02-23 ENCOUNTER — Other Ambulatory Visit: Payer: Self-pay | Admitting: Surgical Oncology

## 2017-02-23 DIAGNOSIS — K2100 Gastro-esophageal reflux disease with esophagitis, without bleeding: Secondary | ICD-10-CM

## 2017-02-23 DIAGNOSIS — Z720 Tobacco use: Secondary | ICD-10-CM

## 2017-03-13 ENCOUNTER — Encounter: Payer: Self-pay | Admitting: Registered Nurse

## 2017-03-13 ENCOUNTER — Ambulatory Visit: Payer: Medicare HMO | Admitting: Registered Nurse

## 2017-03-13 ENCOUNTER — Encounter: Payer: Medicare HMO | Attending: Physical Medicine and Rehabilitation | Admitting: Registered Nurse

## 2017-03-13 VITALS — BP 125/84 | HR 103

## 2017-03-13 DIAGNOSIS — M4696 Unspecified inflammatory spondylopathy, lumbar region: Secondary | ICD-10-CM | POA: Diagnosis not present

## 2017-03-13 DIAGNOSIS — M791 Myalgia: Secondary | ICD-10-CM | POA: Diagnosis present

## 2017-03-13 DIAGNOSIS — M62838 Other muscle spasm: Secondary | ICD-10-CM

## 2017-03-13 DIAGNOSIS — M47817 Spondylosis without myelopathy or radiculopathy, lumbosacral region: Secondary | ICD-10-CM

## 2017-03-13 DIAGNOSIS — M25552 Pain in left hip: Secondary | ICD-10-CM

## 2017-03-13 DIAGNOSIS — M12512 Traumatic arthropathy, left shoulder: Secondary | ICD-10-CM | POA: Insufficient documentation

## 2017-03-13 DIAGNOSIS — M609 Myositis, unspecified: Secondary | ICD-10-CM | POA: Diagnosis present

## 2017-03-13 DIAGNOSIS — F3170 Bipolar disorder, currently in remission, most recent episode unspecified: Secondary | ICD-10-CM | POA: Diagnosis not present

## 2017-03-13 DIAGNOSIS — M7062 Trochanteric bursitis, left hip: Secondary | ICD-10-CM | POA: Diagnosis not present

## 2017-03-13 DIAGNOSIS — M170 Bilateral primary osteoarthritis of knee: Secondary | ICD-10-CM | POA: Diagnosis not present

## 2017-03-13 DIAGNOSIS — G894 Chronic pain syndrome: Secondary | ICD-10-CM | POA: Diagnosis not present

## 2017-03-13 DIAGNOSIS — Z5181 Encounter for therapeutic drug level monitoring: Secondary | ICD-10-CM | POA: Diagnosis present

## 2017-03-13 DIAGNOSIS — M47816 Spondylosis without myelopathy or radiculopathy, lumbar region: Secondary | ICD-10-CM

## 2017-03-13 DIAGNOSIS — M19019 Primary osteoarthritis, unspecified shoulder: Secondary | ICD-10-CM | POA: Diagnosis present

## 2017-03-13 DIAGNOSIS — G609 Hereditary and idiopathic neuropathy, unspecified: Secondary | ICD-10-CM | POA: Diagnosis not present

## 2017-03-13 DIAGNOSIS — M129 Arthropathy, unspecified: Secondary | ICD-10-CM | POA: Diagnosis present

## 2017-03-13 DIAGNOSIS — M19012 Primary osteoarthritis, left shoulder: Secondary | ICD-10-CM | POA: Diagnosis present

## 2017-03-13 DIAGNOSIS — Z79899 Other long term (current) drug therapy: Secondary | ICD-10-CM

## 2017-03-13 MED ORDER — OXYCODONE HCL 15 MG PO TABS: 15.0000 mg | ORAL_TABLET | Freq: Three times a day (TID) | ORAL | 0 refills | Status: DC | PRN

## 2017-03-13 NOTE — Progress Notes (Signed)
Subjective:    Patient ID: [name], female    DOB: 1965-10-21, 51 y.o.   MRN: [id number]  HPI: Ms. Mike Berntsen is a 51year old female who returns for follow up appointmentfor chronic pain and medication refill. She states her pain is located in her lower back and left hip.She rates her pain 7. Her current exercise regime is walking.   S/P Right Shoulder Injection with good relief noted.  Also states she's still waiting on schedule for Bariatric Surgery with Dr. Toney Rakes at [facility].  Last UDS was performed on 01/12/2017, it was consistent.   Pain Inventory Average Pain 6 Pain Right Now 7 My pain is burning, stabbing and aching  In the last 24 hours, has pain interfered with the following? General activity 5 Relation with others 5 Enjoyment of life 5 What TIME of day is your pain at its worst? evening Sleep (in general) Fair  Pain is worse with: walking, sitting and standing Pain improves with: rest and medication Relief from Meds: 7  Mobility ability to climb steps?  yes do you drive?  yes  Function Do you have any goals in this area?  no  Neuro/Psych tremor trouble walking  Prior Studies Any changes since last visit?  no  Physicians involved in your care Any changes since last visit?  no   Family History  Problem Relation Age of Onset  . Schizophrenia Mother   . Hypertension Mother   . Alzheimer's disease Mother   . Stroke Father   . Diabetes Other   . Hypertension Other    Social History   Social History  . Marital status: Divorced    Spouse name: N/A  . Number of children: N/A  . Years of education: N/A   Social History Main Topics  . Smoking status: Current Every Day Smoker    Packs/day: 1.00    Years: 36.00    Types: Cigarettes  . Smokeless tobacco: Never Used  . Alcohol use 0.6 oz/week    1 Shots of liquor per week     Comment: occasional  . Drug use: No     Comment: goes to pain clinic  . Sexual activity:  Not Asked   Other Topics Concern  . None   Social History Narrative  . None   Past Surgical History:  Procedure Laterality Date  . ABDOMINAL HYSTERECTOMY  1998  . CARDIOVASCULAR STRESS TEST  03/2010   Lexiscan Myoview: EF 55%, mild breast attenuation, no ischemia or scar  . DILATION AND CURETTAGE OF UTERUS  1986   "lost a son"  . esophageal ulcer    . JOINT REPLACEMENT    . KNEE ARTHROSCOPY Right 2005  . TONSILLECTOMY    . TOTAL KNEE ARTHROPLASTY Left 03/04/2016   Procedure: TOTAL KNEE ARTHROPLASTY;  Surgeon: Marchia Bond, MD;  Location: [place];  Service: Orthopedics;  Laterality: Left;  . TOTAL KNEE ARTHROPLASTY Right 07/15/2016   Procedure: TOTAL KNEE ARTHROPLASTY;  Surgeon: Marchia Bond, MD;  Location: [place];  Service: Orthopedics;  Laterality: Right;  . TOTAL SHOULDER ARTHROPLASTY Left 07/17/2015   Procedure: TOTAL SHOULDER ARTHROPLASTY;  Surgeon: Marchia Bond, MD;  Location: [place];  Service: Orthopedics;  Laterality: Left;  . TRANSTHORACIC ECHOCARDIOGRAM  03/2010   EF 50-55%, normal LV size, no WMAs  . TUBAL LIGATION  1990   Past Medical History:  Diagnosis Date  . Anemia   . Anxiety   . Bipolar 1 disorder ([place])   . 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 reports that she is cured   . COPD (chronic obstructive pulmonary disease) ([place])   . Depression   . Fibromyalgia   . GERD (gastroesophageal reflux disease)   . Headache    migraine's in 20's  . Hypertension   . Insomnia   . Obesity   . Osteoarthritis    knees, elbows   . 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 Pneumonia    developed in 07/2015- post shoulder replacement   . 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 Primary localized osteoarthritis of left knee 03/04/2016  . Primary localized osteoarthritis of right knee 07/15/2016  . PUD (peptic ulcer disease)   . Spinal stenosis    BP 125/84 (BP Location: Right Arm, Patient Position: Sitting, Cuff Size: Large)    Pulse (!) 103   SpO2 94%   Opioid Risk Score:  3 Fall Risk Score:  `1  Depression screen PHQ 2/9  Depression screen [facility] 2/9 03/13/2017 01/01/2016 12/10/2015 06/25/2015 05/28/2015 01/02/2015  Decreased Interest 2 2 0 0 1 1  Down, Depressed, Hopeless 1 1 0 0 1 1  PHQ - 2 Score 3 3 0 0 2 2  Altered sleeping - 1 - - - 2  Tired, decreased energy - 1 - - - 1  Change in appetite - 2 - - - 1  Feeling bad or failure about yourself  - 2 - - - 1  Trouble concentrating - 1 - - - 0  Moving slowly or fidgety/restless - 1 - - - 0  Suicidal thoughts - 0 - - - 0  PHQ-9 Score - 11 - - - 7  Difficult doing work/chores - Not difficult at all - - - -    Review of Systems  Constitutional: Negative.   HENT: Negative.   Eyes: Negative.   Respiratory: Negative.   Cardiovascular: Negative.   Gastrointestinal: Negative.   Endocrine: Negative.   Genitourinary: Negative.   Musculoskeletal: Positive for gait problem.  Skin: Negative.   Allergic/Immunologic: Negative.   Neurological: Positive for tremors.  Hematological: Negative.   Psychiatric/Behavioral: Negative.   All other systems reviewed and are negative.      Objective:   Physical Exam  Constitutional: She is oriented to person, place, and time. She appears well-developed and well-nourished.  HENT:  Head: Normocephalic and atraumatic.  Neck: Normal range of motion. Neck supple.  Cervical Paraspinal Tenderness: C-5-C-6  Cardiovascular: Normal rate and regular rhythm.   Pulmonary/Chest: Effort normal and breath sounds normal.  Musculoskeletal:  Normal Muscle Bulk and Muscle Testing Reveals: Upper Extremities: Full ROM and Muscle Strength 5/5 Lumbar Paraspinal Tenderness: L-3-L-5 Left: Greater Trochanter Tenderness Lower Extremities: Full ROM and Muscle Strength 5/5 Arises from Table with ease Narrow Based Gait  Neurological: She is alert and oriented to person, place, and time.  Skin: Skin is warm and dry.  Psychiatric: She has a normal  mood and affect.  Nursing note and vitals reviewed.         Assessment & Plan:  1. Fibromyalgia Syndrome: Continue with exercise and heat therapy. Encouraged to increase activity as tolerated. 03/13/2017 2. Osteoarthritis of Bilateral Knees:  Continue Voltaren Gel. 03/13/2017 Orthopedist FollowingContinue with heat and exercise therapy. S/P Left Knee Arthroplasty on 03/04/16 and S/P Right Knee Arthroplasty on 07/15/2016 by Dr. Mardelle Matte. 3. Left Shoulder OA: S/PLeft Shoulder Arthroplasty. Orthopedist Following. Dr. Mardelle Matte. 03/13/2017 4. Bipolar Syndrome: On Depakote and Cymbalta. PCP Following. 03/13/2017 5. Lumbar Spondylosis: 03/13/2017 Refilled:  Oxycodone 15 mg one tablet three times a day #90.  We will continue the opioid monitoring program, this consists of regular clinic visits, examinations, urine drug screen, pill counts as well as use of [place] Controlled Substance Reporting System. 6. Muscle Spasms: Continue Bacolen TID as needed. 03/13/2017 7. Constipation: Continue Senna. No complaints today.03/13/2017 8. Peripheral Neuropathy: Continue: Lidocaine Patches. 03/13/2017 9. Left Greater Trochanteric Tenderness: Continue with Heat and Ice Therapy. 03/13/2017 10.Bilateral OA of Both Hands/ Right Elbow: Continue: Voltaren Gel. 03/13/2017  20 minutes of face to face patient care time was spent during this visit. All questions were encouraged and answered.   F/U in 1 month

## 2017-03-14 IMAGING — DX DG KNEE 1-2V PORT*R*
2 series · 2 of 2 positions shown · non-contrast
Comparison: 06/17/2016

CLINICAL DATA: Right knee replacement

EXAM:
PORTABLE RIGHT KNEE - 1-2 VIEW

[knee ap]
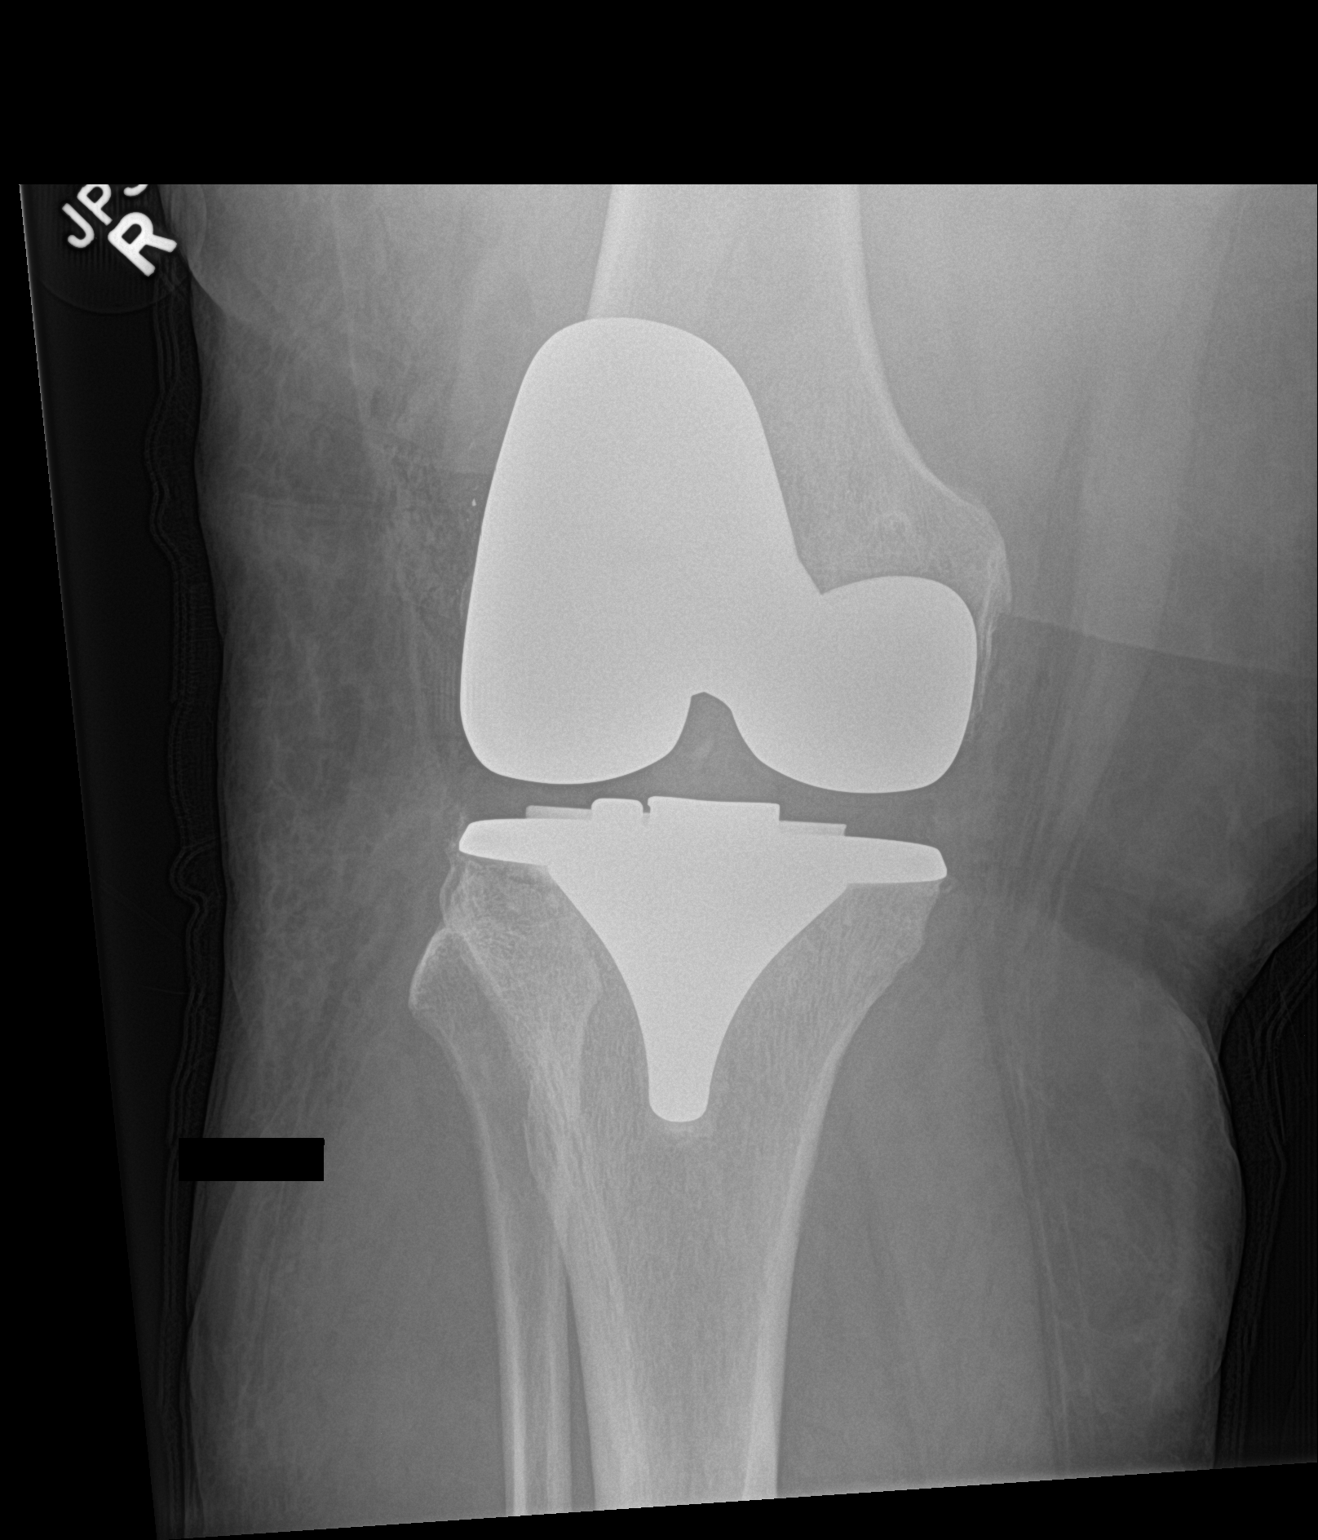

[knee lat]
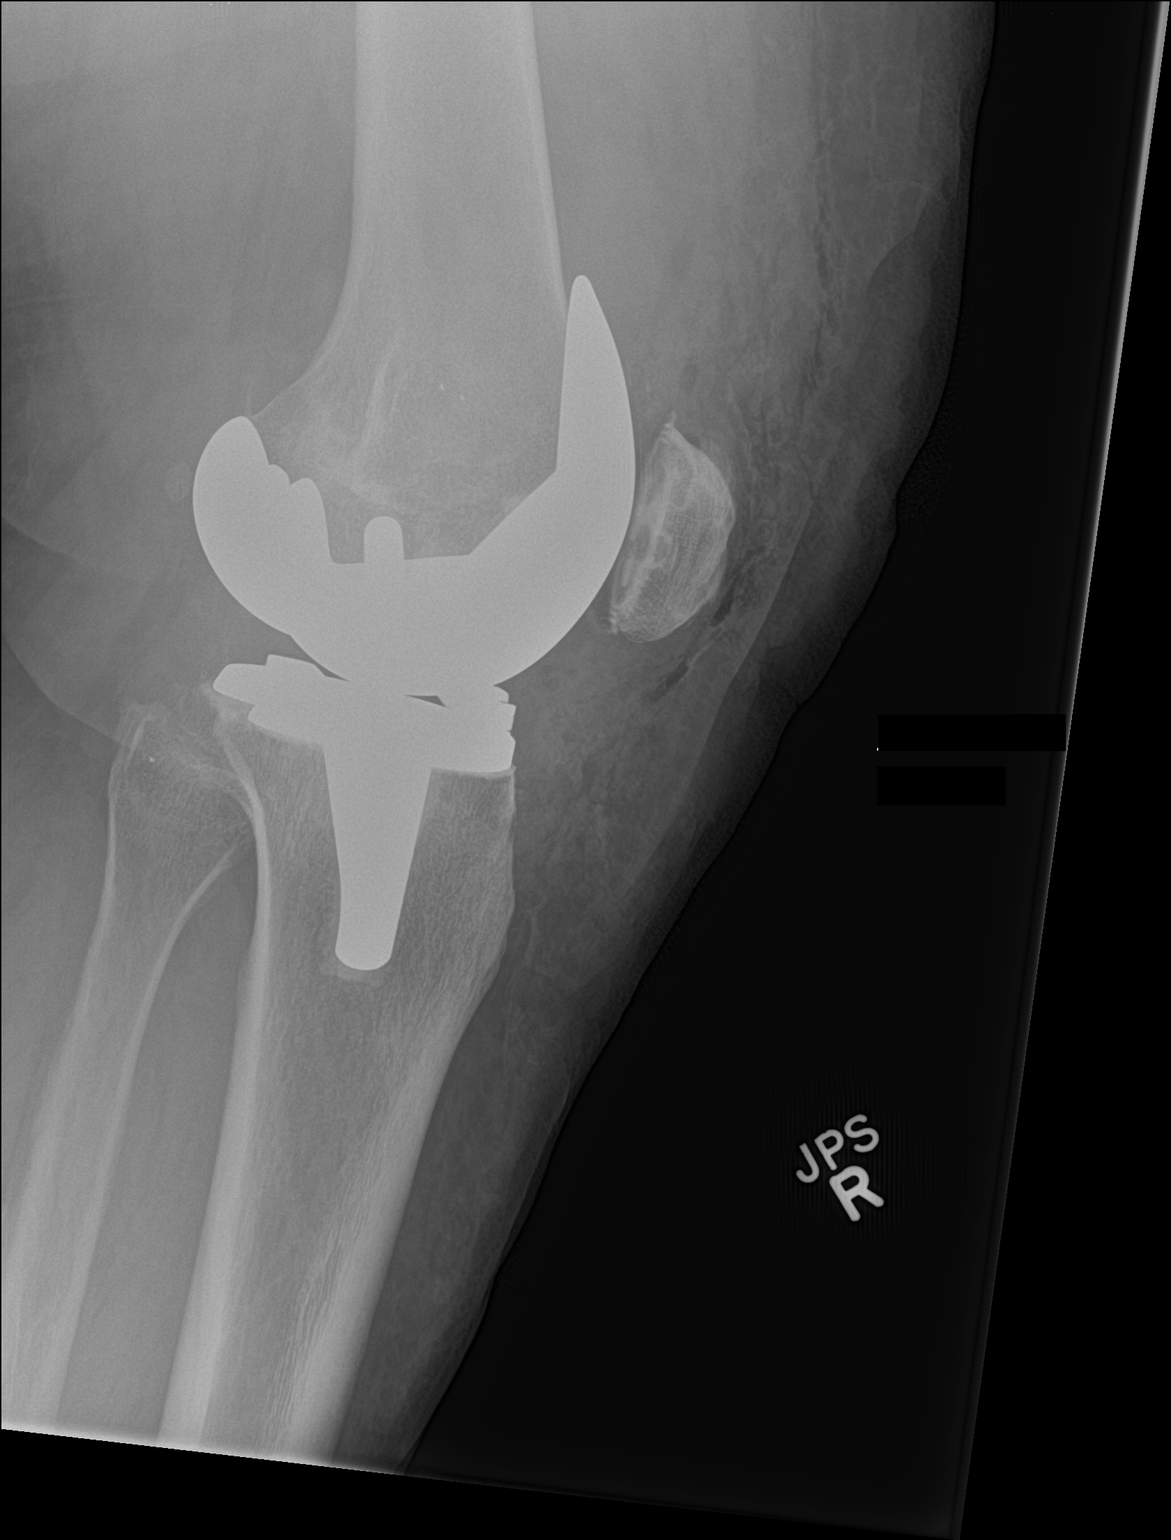

[2 of 2 positions shown; findings below may reference images not displayed]

FINDINGS: Right knee total replacement has been performed. Components appear
aligned. No acute osseous or hardware abnormality. Expected postop
changes of the soft tissues.
IMPRESSION: Expected appearance and alignment status post right knee
arthroplasty.

## 2017-03-16 ENCOUNTER — Inpatient Hospital Stay
Admission: RE | Admit: 2017-03-16 | Discharge: 2017-03-16 | Disposition: A | Discharge status: Home / Self Care | Payer: Medicare HMO | Source: Ambulatory Visit | Attending: Surgical Oncology | Admitting: Surgical Oncology

## 2017-04-01 ENCOUNTER — Other Ambulatory Visit: Payer: Medicare HMO

## 2017-04-13 ENCOUNTER — Encounter: Payer: Medicare HMO | Attending: Physical Medicine and Rehabilitation | Admitting: Registered Nurse

## 2017-04-13 ENCOUNTER — Telehealth: Payer: Self-pay | Admitting: Registered Nurse

## 2017-04-13 ENCOUNTER — Encounter: Payer: Self-pay | Admitting: Registered Nurse

## 2017-04-13 VITALS — BP 109/75 | HR 102 | Resp 14

## 2017-04-13 DIAGNOSIS — M12512 Traumatic arthropathy, left shoulder: Secondary | ICD-10-CM | POA: Diagnosis present

## 2017-04-13 DIAGNOSIS — G609 Hereditary and idiopathic neuropathy, unspecified: Secondary | ICD-10-CM

## 2017-04-13 DIAGNOSIS — M129 Arthropathy, unspecified: Secondary | ICD-10-CM | POA: Diagnosis present

## 2017-04-13 DIAGNOSIS — M609 Myositis, unspecified: Secondary | ICD-10-CM | POA: Diagnosis present

## 2017-04-13 DIAGNOSIS — Z5181 Encounter for therapeutic drug level monitoring: Secondary | ICD-10-CM | POA: Insufficient documentation

## 2017-04-13 DIAGNOSIS — M791 Myalgia: Secondary | ICD-10-CM | POA: Diagnosis present

## 2017-04-13 DIAGNOSIS — F3170 Bipolar disorder, currently in remission, most recent episode unspecified: Secondary | ICD-10-CM | POA: Diagnosis not present

## 2017-04-13 DIAGNOSIS — K5909 Other constipation: Secondary | ICD-10-CM | POA: Diagnosis not present

## 2017-04-13 DIAGNOSIS — Z79899 Other long term (current) drug therapy: Secondary | ICD-10-CM | POA: Diagnosis present

## 2017-04-13 DIAGNOSIS — G894 Chronic pain syndrome: Secondary | ICD-10-CM

## 2017-04-13 DIAGNOSIS — M19019 Primary osteoarthritis, unspecified shoulder: Secondary | ICD-10-CM | POA: Diagnosis present

## 2017-04-13 DIAGNOSIS — M47817 Spondylosis without myelopathy or radiculopathy, lumbosacral region: Secondary | ICD-10-CM

## 2017-04-13 DIAGNOSIS — M19012 Primary osteoarthritis, left shoulder: Secondary | ICD-10-CM | POA: Diagnosis present

## 2017-04-13 DIAGNOSIS — M62838 Other muscle spasm: Secondary | ICD-10-CM | POA: Diagnosis not present

## 2017-04-13 MED ORDER — OXYCODONE HCL 15 MG PO TABS: 15.0000 mg | ORAL_TABLET | Freq: Three times a day (TID) | ORAL | 0 refills | Status: DC | PRN

## 2017-04-13 NOTE — Telephone Encounter (Signed)
On 04/13/2017 the  [place] was reviewed no conflict was seen on the [place] with multiple prescribes. [name] has a signed narcotic contract with our office. If there were any discrepancies this would have been reported to her physician.

## 2017-04-13 NOTE — Progress Notes (Signed)
Subjective:    Patient ID: [name], female    DOB: 1966-05-24, 51 y.o.   MRN: [id number]  HPI:  [name] is a 51year old female who returns for follow up appointmentfor chronic pain and medication refill. She states her pain is located in her bilateral hands and lower back. She rates her pain 5. Her current exercise regime is walking.   Last UDS was performed on 01/12/2017, it was consistent.   Pain Inventory Average Pain 6 Pain Right Now 5 My pain is sharp, stabbing and aching  In the last 24 hours, has pain interfered with the following? General activity 5 Relation with others 7 Enjoyment of life 7 What TIME of day is your pain at its worst? evening Sleep (in general) Fair  Pain is worse with: walking and standing Pain improves with: rest and medication Relief from Meds: 5  Mobility how many minutes can you walk? 10 ability to climb steps?  yes do you drive?  yes  Function retired Do you have any goals in this area?  no  Neuro/Psych tremor  Prior Studies Any changes since last visit?  no  Physicians involved in your care Any changes since last visit?  no   Family History  Problem Relation Age of Onset  . Schizophrenia Mother   . Hypertension Mother   . Alzheimer's disease Mother   . Stroke Father   . Diabetes Other   . Hypertension Other    Social History   Social History  . Marital status: Divorced    Spouse name: N/A  . Number of children: N/A  . Years of education: N/A   Social History Main Topics  . Smoking status: Current Every Day Smoker    Packs/day: 1.00    Years: 36.00    Types: Cigarettes  . Smokeless tobacco: Never Used  . Alcohol use 0.6 oz/week    1 Shots of liquor per week     Comment: occasional  . Drug use: No     Comment: goes to pain clinic  . Sexual activity: Not Asked   Other Topics Concern  . None   Social History Narrative  . None   Past Surgical History:  Procedure Laterality Date  .  ABDOMINAL HYSTERECTOMY  1998  . CARDIOVASCULAR STRESS TEST  03/2010   Lexiscan Myoview: EF 55%, mild breast attenuation, no ischemia or scar  . DILATION AND CURETTAGE OF UTERUS  1986   "lost a son"  . esophageal ulcer    . JOINT REPLACEMENT    . KNEE ARTHROSCOPY Right 2005  . TONSILLECTOMY    . TOTAL KNEE ARTHROPLASTY Left 03/04/2016   Procedure: TOTAL KNEE ARTHROPLASTY;  Surgeon: Marchia Bond, MD;  Location: [place];  Service: Orthopedics;  Laterality: Left;  . TOTAL KNEE ARTHROPLASTY Right 07/15/2016   Procedure: TOTAL KNEE ARTHROPLASTY;  Surgeon: Marchia Bond, MD;  Location: [place];  Service: Orthopedics;  Laterality: Right;  . TOTAL SHOULDER ARTHROPLASTY Left 07/17/2015   Procedure: TOTAL SHOULDER ARTHROPLASTY;  Surgeon: Marchia Bond, MD;  Location: [place];  Service: Orthopedics;  Laterality: Left;  . TRANSTHORACIC ECHOCARDIOGRAM  03/2010   EF 50-55%, normal LV size, no WMAs  . TUBAL LIGATION  1990   Past Medical History:  Diagnosis Date  . Anemia   . Anxiety   . Bipolar 1 disorder ([place])   . 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 reports that she is cured   . COPD (chronic  obstructive pulmonary disease) ([employer])   . Depression   . Fibromyalgia   . GERD (gastroesophageal reflux disease)   . Headache    migraine's in 20's  . Hypertension   . Insomnia   . Obesity   . Osteoarthritis    knees, elbows   . 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 Pneumonia    developed in 07/2015- post shoulder replacement   . 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 Primary localized osteoarthritis of left knee 03/04/2016  . Primary localized osteoarthritis of right knee 07/15/2016  . PUD (peptic ulcer disease)   . Spinal stenosis    BP 109/75 (BP Location: Right Arm, Patient Position: Sitting, Cuff Size: Large)   Pulse (!) 102   Resp 14   SpO2 94%   Opioid Risk Score:   Fall Risk Score:  `1  Depression screen PHQ 2/9  Depression screen [facility] 2/9  03/13/2017 01/01/2016 12/10/2015 06/25/2015 05/28/2015 01/02/2015  Decreased Interest 2 2 0 0 1 1  Down, Depressed, Hopeless 1 1 0 0 1 1  PHQ - 2 Score 3 3 0 0 2 2  Altered sleeping - 1 - - - 2  Tired, decreased energy - 1 - - - 1  Change in appetite - 2 - - - 1  Feeling bad or failure about yourself  - 2 - - - 1  Trouble concentrating - 1 - - - 0  Moving slowly or fidgety/restless - 1 - - - 0  Suicidal thoughts - 0 - - - 0  PHQ-9 Score - 11 - - - 7  Difficult doing work/chores - Not difficult at all - - - -    Review of Systems  Constitutional: Negative.   HENT: Negative.   Eyes: Negative.   Respiratory: Negative.   Cardiovascular: Negative.   Gastrointestinal: Negative.   Endocrine: Negative.   Genitourinary: Negative.   Musculoskeletal: Positive for arthralgias and back pain.  Neurological: Positive for tremors.  Hematological: Negative.   Psychiatric/Behavioral: Negative.   All other systems reviewed and are negative.      Objective:   Physical Exam  Constitutional: She is oriented to person, place, and time. She appears well-developed and well-nourished.  HENT:  Head: Normocephalic and atraumatic.  Neck: Normal range of motion. Neck supple.  Cardiovascular: Normal rate and regular rhythm.   Pulmonary/Chest: Effort normal and breath sounds normal.  Musculoskeletal:  Normal Muscle Bulk and Muscle Testing Reveals: Upper Extremities: Full ROM and Muscle Strength 5/5 Lumbar Paraspinal Tenderness: L-3-L-5 Lower Extremities: Full ROM and Muscle Strength 5/5 Arises from Table with Ease Narrow Based Gait  Neurological: She is alert and oriented to person, place, and time.  Skin: Skin is warm and dry.  Psychiatric: She has a normal mood and affect.  Nursing note and vitals reviewed.         Assessment & Plan:  1. Fibromyalgia Syndrome: Continue with exercise and heat therapy. Encouraged to increase activity as tolerated. 04/13/2017 2. Osteoarthritis of Bilateral Knees:   Continue Voltaren Gel. 04/13/2017 Orthopedist FollowingContinue with heat and exercise therapy. S/P Left Knee Arthroplasty on 03/04/16 and S/P Right Knee Arthroplasty on 07/15/2016 by Dr. Mardelle Matte. 3. Left Shoulder OA: S/PLeft Shoulder Arthroplasty. Orthopedist Following. Dr. Mardelle Matte. 04/13/2017 4. Bipolar Syndrome: On Depakote and Cymbalta. PCP Following. 04/13/2017 5. Lumbar Spondylosis: 04/13/2017 Refilled: Oxycodone 15 mg one tablet three times a day #90.  We will continue the opioid monitoring program, this consists of regular clinic visits, examinations, urine drug  screen, pill counts as well as use of [place] Controlled Substance Reporting System. 6. Muscle Spasms: Continue Bacolen TID as needed. 04/13/2017 7. Constipation: Continue Senna. No complaints today.04/13/2017 8. Peripheral Neuropathy: Continue: Lidocaine Patches. 04/13/2017 9. Left Greater Trochanteric Tenderness: Continue with Heat and Ice Therapy. 04/13/2017 10.Bilateral OA of Both Hands/ Right Elbow: Continue: Voltaren Gel. 04/13/2017  20 minutes of face to face patient care time was spent during this visit. All questions were encouraged and answered.   F/U in 1 month

## 2017-05-08 ENCOUNTER — Encounter (HOSPITAL_BASED_OUTPATIENT_CLINIC_OR_DEPARTMENT_OTHER): Payer: Medicare HMO | Admitting: Registered Nurse

## 2017-05-08 ENCOUNTER — Encounter: Payer: Self-pay | Admitting: Registered Nurse

## 2017-05-08 VITALS — BP 114/81 | HR 96

## 2017-05-08 DIAGNOSIS — M62838 Other muscle spasm: Secondary | ICD-10-CM

## 2017-05-08 DIAGNOSIS — M47817 Spondylosis without myelopathy or radiculopathy, lumbosacral region: Secondary | ICD-10-CM

## 2017-05-08 DIAGNOSIS — F3170 Bipolar disorder, currently in remission, most recent episode unspecified: Secondary | ICD-10-CM

## 2017-05-08 DIAGNOSIS — M47816 Spondylosis without myelopathy or radiculopathy, lumbar region: Secondary | ICD-10-CM

## 2017-05-08 DIAGNOSIS — G894 Chronic pain syndrome: Secondary | ICD-10-CM | POA: Diagnosis not present

## 2017-05-08 DIAGNOSIS — G609 Hereditary and idiopathic neuropathy, unspecified: Secondary | ICD-10-CM | POA: Diagnosis not present

## 2017-05-08 DIAGNOSIS — M170 Bilateral primary osteoarthritis of knee: Secondary | ICD-10-CM | POA: Diagnosis not present

## 2017-05-08 DIAGNOSIS — Z79899 Other long term (current) drug therapy: Secondary | ICD-10-CM

## 2017-05-08 DIAGNOSIS — M4696 Unspecified inflammatory spondylopathy, lumbar region: Secondary | ICD-10-CM | POA: Diagnosis not present

## 2017-05-08 DIAGNOSIS — Z5181 Encounter for therapeutic drug level monitoring: Secondary | ICD-10-CM

## 2017-05-08 MED ORDER — OXYCODONE HCL 15 MG PO TABS: 15.0000 mg | ORAL_TABLET | Freq: Three times a day (TID) | ORAL | 0 refills | Status: DC | PRN

## 2017-05-08 NOTE — Progress Notes (Signed)
Subjective:    Patient ID: [name], female    DOB: 25-Oct-1965, 51 y.o.   MRN: [id number]  HPI: [name] is a 51year old female who returns for follow up appointmentfor chronic pain and medication refill. She states her pain is located in her lower back. She rates her pain 7. Her current exercise regime is walking.   Last UDS was performed on 01/12/2017, it was consistent. Oral Swab performed today.   Pain Inventory Average Pain 7 Pain Right Now 7 My pain is sharp, stabbing and aching  In the last 24 hours, has pain interfered with the following? General activity 7 Relation with others 6 Enjoyment of life 6 What TIME of day is your pain at its worst? evening Sleep (in general) Fair  Pain is worse with: walking, bending and some activites Pain improves with: rest and medication Relief from Meds: 7  Mobility walk without assistance how many minutes can you walk? 30  Function employed # of hrs/week 15 Do you have any goals in this area?  no  Neuro/Psych tremor trouble walking  Prior Studies Any changes since last visit?  no  Physicians involved in your care Any changes since last visit?  no   Family History  Problem Relation Age of Onset  . Schizophrenia Mother   . Hypertension Mother   . Alzheimer's disease Mother   . Stroke Father   . Diabetes Other   . Hypertension Other    Social History   Social History  . Marital status: Divorced    Spouse name: N/A  . Number of children: N/A  . Years of education: N/A   Social History Main Topics  . Smoking status: Current Every Day Smoker    Packs/day: 1.00    Years: 36.00    Types: Cigarettes  . Smokeless tobacco: Never Used  . Alcohol use 0.6 oz/week    1 Shots of liquor per week     Comment: occasional  . Drug use: No     Comment: goes to pain clinic  . Sexual activity: Not Asked   Other Topics Concern  . None   Social History Narrative  . None   Past Surgical History:    Procedure Laterality Date  . ABDOMINAL HYSTERECTOMY  1998  . CARDIOVASCULAR STRESS TEST  03/2010   Lexiscan Myoview: EF 55%, mild breast attenuation, no ischemia or scar  . DILATION AND CURETTAGE OF UTERUS  1986   "lost a son"  . esophageal ulcer    . JOINT REPLACEMENT    . KNEE ARTHROSCOPY Right 2005  . TONSILLECTOMY    . TOTAL KNEE ARTHROPLASTY Left 03/04/2016   Procedure: TOTAL KNEE ARTHROPLASTY;  Surgeon: Marchia Bond, MD;  Location: [place];  Service: Orthopedics;  Laterality: Left;  . TOTAL KNEE ARTHROPLASTY Right 07/15/2016   Procedure: TOTAL KNEE ARTHROPLASTY;  Surgeon: Marchia Bond, MD;  Location: [place];  Service: Orthopedics;  Laterality: Right;  . TOTAL SHOULDER ARTHROPLASTY Left 07/17/2015   Procedure: TOTAL SHOULDER ARTHROPLASTY;  Surgeon: Marchia Bond, MD;  Location: [place];  Service: Orthopedics;  Laterality: Left;  . TRANSTHORACIC ECHOCARDIOGRAM  03/2010   EF 50-55%, normal LV size, no WMAs  . TUBAL LIGATION  1990   Past Medical History:  Diagnosis Date  . Anemia   . Anxiety   . Bipolar 1 disorder ([place])   . 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 reports that she is cured   . COPD (  chronic obstructive pulmonary disease) ([place])   . Depression   . Fibromyalgia   . GERD (gastroesophageal reflux disease)   . Headache    migraine's in 20's  . Hypertension   . Insomnia   . Obesity   . Osteoarthritis    knees, elbows   . 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 Pneumonia    developed in 07/2015- post shoulder replacement   . 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 Primary localized osteoarthritis of left knee 03/04/2016  . Primary localized osteoarthritis of right knee 07/15/2016  . PUD (peptic ulcer disease)   . Spinal stenosis    BP 114/81   Pulse 96   SpO2 92%   Opioid Risk Score:  3 Fall Risk Score:  `1  Depression screen PHQ 2/9  Depression screen [facility] 2/9 05/08/2017 03/13/2017 01/01/2016 12/10/2015 06/25/2015  05/28/2015 01/02/2015  Decreased Interest 2 2 2  0 0 1 1  Down, Depressed, Hopeless 1 1 1  0 0 1 1  PHQ - 2 Score 3 3 3  0 0 2 2  Altered sleeping - - 1 - - - 2  Tired, decreased energy - - 1 - - - 1  Change in appetite - - 2 - - - 1  Feeling bad or failure about yourself  - - 2 - - - 1  Trouble concentrating - - 1 - - - 0  Moving slowly or fidgety/restless - - 1 - - - 0  Suicidal thoughts - - 0 - - - 0  PHQ-9 Score - - 11 - - - 7  Difficult doing work/chores - - Not difficult at all - - - -    Review of Systems  Constitutional: Negative.   HENT: Negative.   Eyes: Negative.   Respiratory: Negative.   Cardiovascular: Negative.   Gastrointestinal: Negative.   Endocrine: Negative.   Genitourinary: Negative.   Musculoskeletal: Positive for gait problem.  Skin: Negative.   Allergic/Immunologic: Negative.   Neurological: Positive for tremors.  Hematological: Negative.   Psychiatric/Behavioral: Negative.   All other systems reviewed and are negative.      Objective:   Physical Exam  Constitutional: She is oriented to person, place, and time. She appears well-developed and well-nourished.  HENT:  Head: Normocephalic and atraumatic.  Neck: Normal range of motion. Neck supple.  Cardiovascular: Normal rate and regular rhythm.   Pulmonary/Chest: Effort normal and breath sounds normal.  Musculoskeletal:  Normal Muscle Bulk and Muscle Testing Reveals: Upper Extremities: Full ROM and Muscle Strength 5/5 Lumbar Paraspinal Tenderness: L-3-l-5 Lower Extremities: Full ROM and Muscle Strength 5/5 Arises from Table with ease Narrow based gait  Neurological: She is alert and oriented to person, place, and time.  Skin: Skin is warm and dry.  Psychiatric: She has a normal mood and affect.  Nursing note and vitals reviewed.         Assessment & Plan:  1. Fibromyalgia Syndrome: Continue with exercise and heat therapy. Encouraged to increase activity as tolerated. 05/08/2017 2.  Osteoarthritis of Bilateral Knees: Continue Voltaren Gel. 05/08/2017 Orthopedist FollowingContinue with heat and exercise therapy. S/P Left Knee Arthroplasty on 03/04/16 and S/P Right Knee Arthroplasty on 07/15/2016 by Dr. Mardelle Matte. 3. Left Shoulder OA: S/PLeft Shoulder Arthroplasty. Orthopedist Following. Dr. Mardelle Matte. 05/10/2017 4. Bipolar Syndrome: On Depakote and Cymbalta. PCP Following. 05/08/2017 5. Lumbar Spondylosis: 05/08/2017 Refilled: Oxycodone 15 mg one tablet three times a day #90.  We will continue the opioid monitoring program, this consists of regular clinic  visits, examinations, urine drug screen, pill counts as well as use of [place] Controlled Substance Reporting System. 6. Muscle Spasms: Continue Bacolen TID as needed. 05/08/2017 7. Constipation: Continue Senna. No complaints today.05/08/2017 8. Peripheral Neuropathy: Continue: Lidocaine Patches. 05/08/2017 9.Bilateral OA of Both Hands/ Right Elbow: Continue: Voltaren Gel. 05/08/2017  20 minutes of face to face patient care time was spent during this visit. All questions were encouraged and answered.  F/U in 1 month

## 2017-05-14 LAB — DRUG TOX MONITOR 1 W/CONF, ORAL FLD
AMPHETAMINES: NEGATIVE ng/mL (ref <10)
Barbiturates: NEGATIVE ng/mL (ref <10)
Benzodiazepines: NEGATIVE ng/mL (ref <0.50)
Buprenorphine: NEGATIVE ng/mL (ref <0.025)
CODEINE: NEGATIVE ng/mL (ref <2.5)
COTININE: 12.9 ng/mL — AB (ref <5.0)
Cocaine: NEGATIVE ng/mL (ref <2.5)
Dihydrocodeine: NEGATIVE ng/mL (ref <2.5)
Fentanyl: NEGATIVE ng/mL (ref <0.10)
Heroin Metabolite: NEGATIVE ng/mL (ref <1.0)
Hydrocodone: NEGATIVE ng/mL (ref <2.5)
Hydromorphone: NEGATIVE ng/mL (ref <2.5)
MDMA: NEGATIVE ng/mL (ref <10)
MEPERIDINE: NEGATIVE ng/mL (ref <5.0)
Marijuana: NEGATIVE ng/mL (ref <2.5)
Meprobamate: NEGATIVE ng/mL (ref <2.5)
Methadone: NEGATIVE ng/mL (ref <5.0)
Morphine: NEGATIVE ng/mL (ref <2.5)
NICOTINE METABOLITE: POSITIVE ng/mL — AB (ref <5.0)
NOROXYCODONE: NEGATIVE ng/mL (ref <2.5)
Norhydrocodone: NEGATIVE ng/mL (ref <2.5)
OXYMORPHONE: NEGATIVE ng/mL (ref <2.5)
PHENCYCLIDINE: NEGATIVE ng/mL (ref <10)
Propoxyphene: NEGATIVE ng/mL (ref <5.0)
TAPENTADOL: NEGATIVE ng/mL (ref <5.0)
Tramadol: NEGATIVE ng/mL (ref <5.0)
ZOLPIDEM: NEGATIVE ng/mL (ref <5.0)

## 2017-05-14 LAB — DRUG TOX ALC METAB W/CON, ORAL FLD: ALCOHOL METABOLITE: NEGATIVE ng/mL (ref <25)

## 2017-05-15 ENCOUNTER — Telehealth: Payer: Self-pay | Admitting: *Deleted

## 2017-05-15 NOTE — Telephone Encounter (Signed)
Oral swab drug screen was inconsistent for prescribed medications. She reported last dose was 05/08/17 (day of test) and had #15 tablets at the appointment. The opiates were negative as well as benzos, though we are not prescribing her alprazolam. I verified with [facility] toxicologist that opiates were confirmed negative.

## 2017-06-05 ENCOUNTER — Encounter: Payer: Medicare HMO | Attending: Physical Medicine and Rehabilitation | Admitting: Registered Nurse

## 2017-06-05 DIAGNOSIS — M47817 Spondylosis without myelopathy or radiculopathy, lumbosacral region: Secondary | ICD-10-CM | POA: Insufficient documentation

## 2017-06-05 DIAGNOSIS — M791 Myalgia: Secondary | ICD-10-CM | POA: Insufficient documentation

## 2017-06-05 DIAGNOSIS — M19012 Primary osteoarthritis, left shoulder: Secondary | ICD-10-CM | POA: Insufficient documentation

## 2017-06-05 DIAGNOSIS — Z5181 Encounter for therapeutic drug level monitoring: Secondary | ICD-10-CM | POA: Insufficient documentation

## 2017-06-05 DIAGNOSIS — M129 Arthropathy, unspecified: Secondary | ICD-10-CM | POA: Insufficient documentation

## 2017-06-05 DIAGNOSIS — M609 Myositis, unspecified: Secondary | ICD-10-CM | POA: Insufficient documentation

## 2017-06-05 DIAGNOSIS — M12512 Traumatic arthropathy, left shoulder: Secondary | ICD-10-CM | POA: Insufficient documentation

## 2017-06-05 DIAGNOSIS — Z79899 Other long term (current) drug therapy: Secondary | ICD-10-CM | POA: Insufficient documentation

## 2017-06-05 DIAGNOSIS — M19019 Primary osteoarthritis, unspecified shoulder: Secondary | ICD-10-CM | POA: Insufficient documentation

## 2017-06-09 ENCOUNTER — Encounter (HOSPITAL_BASED_OUTPATIENT_CLINIC_OR_DEPARTMENT_OTHER): Payer: Medicare HMO | Admitting: Registered Nurse

## 2017-06-09 ENCOUNTER — Encounter: Payer: Self-pay | Admitting: Registered Nurse

## 2017-06-09 VITALS — BP 107/74 | HR 92

## 2017-06-09 DIAGNOSIS — G894 Chronic pain syndrome: Secondary | ICD-10-CM

## 2017-06-09 DIAGNOSIS — G609 Hereditary and idiopathic neuropathy, unspecified: Secondary | ICD-10-CM | POA: Diagnosis not present

## 2017-06-09 DIAGNOSIS — M19012 Primary osteoarthritis, left shoulder: Secondary | ICD-10-CM | POA: Diagnosis present

## 2017-06-09 DIAGNOSIS — M129 Arthropathy, unspecified: Secondary | ICD-10-CM | POA: Diagnosis present

## 2017-06-09 DIAGNOSIS — F3170 Bipolar disorder, currently in remission, most recent episode unspecified: Secondary | ICD-10-CM

## 2017-06-09 DIAGNOSIS — M19042 Primary osteoarthritis, left hand: Secondary | ICD-10-CM | POA: Diagnosis not present

## 2017-06-09 DIAGNOSIS — M62838 Other muscle spasm: Secondary | ICD-10-CM | POA: Diagnosis not present

## 2017-06-09 DIAGNOSIS — M19041 Primary osteoarthritis, right hand: Secondary | ICD-10-CM

## 2017-06-09 DIAGNOSIS — Z5181 Encounter for therapeutic drug level monitoring: Secondary | ICD-10-CM

## 2017-06-09 DIAGNOSIS — M170 Bilateral primary osteoarthritis of knee: Secondary | ICD-10-CM

## 2017-06-09 DIAGNOSIS — M609 Myositis, unspecified: Secondary | ICD-10-CM | POA: Diagnosis present

## 2017-06-09 DIAGNOSIS — M47817 Spondylosis without myelopathy or radiculopathy, lumbosacral region: Secondary | ICD-10-CM | POA: Diagnosis not present

## 2017-06-09 DIAGNOSIS — M791 Myalgia: Secondary | ICD-10-CM | POA: Diagnosis present

## 2017-06-09 DIAGNOSIS — M12512 Traumatic arthropathy, left shoulder: Secondary | ICD-10-CM | POA: Diagnosis present

## 2017-06-09 DIAGNOSIS — Z79899 Other long term (current) drug therapy: Secondary | ICD-10-CM

## 2017-06-09 DIAGNOSIS — M19019 Primary osteoarthritis, unspecified shoulder: Secondary | ICD-10-CM | POA: Diagnosis present

## 2017-06-09 MED ORDER — OXYCODONE HCL 15 MG PO TABS: 15.0000 mg | ORAL_TABLET | Freq: Three times a day (TID) | ORAL | 0 refills | Status: DC | PRN

## 2017-06-09 NOTE — Progress Notes (Signed)
Subjective:    Patient ID: [name], female    DOB: 1966-04-06, 51 y.o.   MRN: [id number]  HPI: [name] is a 51year old female who returns for follow up appointmentfor chronic pain and medication refill. She states her pain is located in her neck, bilateral hands and lower back. She rates her pain 7.Her current exercise regime is walking. Educated on working schedule and moderation, no increase of medications will be given. She verbalizes understanding.   [name] is working part-time for [employer], she states since working she has noticed increase intensity of pain.   Last UDS was performed on 05/08/2017, it was inconsistent, opiates were negative. Education provided and [name] realizes if this occurs again can lead to her being discharge, she verbalizes understanding.   Pain Inventory Average Pain 8 Pain Right Now 7 My pain is sharp, stabbing, tingling and aching  In the last 24 hours, has pain interfered with the following? General activity 7 Relation with others 5 Enjoyment of life 6 What TIME of day is your pain at its worst? day and night Sleep (in general) NA  Pain is worse with: walking and bending Pain improves with: rest, heat/ice and medication Relief from Meds: 6  Mobility walk without assistance ability to climb steps?  yes do you drive?  yes  Function employed # of hrs/week 15  Neuro/Psych No problems in this area  Prior Studies Any changes since last visit?  no  Physicians involved in your care Any changes since last visit?  no   Family History  Problem Relation Age of Onset  . Schizophrenia Mother   . Hypertension Mother   . Alzheimer's disease Mother   . Stroke Father   . Diabetes Other   . Hypertension Other    Social History   Social History  . Marital status: Divorced    Spouse name: N/A  . Number of children: N/A  . Years of education: N/A   Social History Main Topics  . Smoking status:  Current Every Day Smoker    Packs/day: 1.00    Years: 36.00    Types: Cigarettes  . Smokeless tobacco: Never Used  . Alcohol use 0.6 oz/week    1 Shots of liquor per week     Comment: occasional  . Drug use: No     Comment: goes to pain clinic  . Sexual activity: Not Asked   Other Topics Concern  . None   Social History Narrative  . None   Past Surgical History:  Procedure Laterality Date  . ABDOMINAL HYSTERECTOMY  1998  . CARDIOVASCULAR STRESS TEST  03/2010   Lexiscan Myoview: EF 55%, mild breast attenuation, no ischemia or scar  . DILATION AND CURETTAGE OF UTERUS  1986   "lost a son"  . esophageal ulcer    . JOINT REPLACEMENT    . KNEE ARTHROSCOPY Right 2005  . TONSILLECTOMY    . TOTAL KNEE ARTHROPLASTY Left 03/04/2016   Procedure: TOTAL KNEE ARTHROPLASTY;  Surgeon: Marchia Bond, MD;  Location: [place];  Service: Orthopedics;  Laterality: Left;  . TOTAL KNEE ARTHROPLASTY Right 07/15/2016   Procedure: TOTAL KNEE ARTHROPLASTY;  Surgeon: Marchia Bond, MD;  Location: [place];  Service: Orthopedics;  Laterality: Right;  . TOTAL SHOULDER ARTHROPLASTY Left 07/17/2015   Procedure: TOTAL SHOULDER ARTHROPLASTY;  Surgeon: Marchia Bond, MD;  Location: [place];  Service: Orthopedics;  Laterality: Left;  . TRANSTHORACIC ECHOCARDIOGRAM  03/2010   EF  50-55%, normal LV size, no WMAs  . TUBAL LIGATION  1990   Past Medical History:  Diagnosis Date  . Anemia   . Anxiety   . Bipolar 1 disorder ([place])   . 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 reports that she is cured   . COPD (chronic obstructive pulmonary disease) ([place])   . Depression   . Fibromyalgia   . GERD (gastroesophageal reflux disease)   . Headache    migraine's in 20's  . Hypertension   . Insomnia   . Obesity   . Osteoarthritis    knees, elbows   . 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 Pneumonia    developed in 07/2015- post shoulder replacement   . possible malignant hyperthermia variant post  op 03/07/2016  . Primary localized osteoarthritis of left knee 03/04/2016  . Primary localized osteoarthritis of right knee 07/15/2016  . PUD (peptic ulcer disease)   . Spinal stenosis    BP 107/74   Pulse 92   SpO2 95%   Opioid Risk Score:  3 Fall Risk Score:  1  Depression screen PHQ 2/9  Depression screen [facility] 2/9 06/09/2017 05/08/2017 03/13/2017 01/01/2016 12/10/2015 06/25/2015 05/28/2015  Decreased Interest 2 2 2 2  0 0 1  Down, Depressed, Hopeless 1 1 1 1  0 0 1  PHQ - 2 Score 3 3 3 3  0 0 2  Altered sleeping - - - 1 - - -  Tired, decreased energy - - - 1 - - -  Change in appetite - - - 2 - - -  Feeling bad or failure about yourself  - - - 2 - - -  Trouble concentrating - - - 1 - - -  Moving slowly or fidgety/restless - - - 1 - - -  Suicidal thoughts - - - 0 - - -  PHQ-9 Score - - - 11 - - -  Difficult doing work/chores - - - Not difficult at all - - -    Review of Systems  Constitutional: Negative.   HENT: Negative.   Eyes: Negative.   Respiratory: Negative.   Cardiovascular: Negative.   Gastrointestinal: Negative.   Endocrine: Negative.   Genitourinary: Negative.   Musculoskeletal: Negative.   Skin: Negative.   Allergic/Immunologic: Negative.   Neurological: Negative.   Hematological: Negative.   Psychiatric/Behavioral: Negative.   All other systems reviewed and are negative.      Objective:   Physical Exam  Constitutional: She is oriented to person, place, and time. She appears well-developed and well-nourished.  HENT:  Head: Normocephalic and atraumatic.  Neck: Normal range of motion. Neck supple.  Cervical Paraspinal Tenderness: C-5-C-6  Cardiovascular: Normal rate and regular rhythm.   Pulmonary/Chest: Effort normal and breath sounds normal.  Musculoskeletal:  Normal Muscle Bulk and Muscle Testing Reveals: Upper Extremities: Full ROM and Muscle Strength 5/5 Left AC Joint Tenderness Lumbar Hypersensitivity Lower Extremities: Full ROM and Muscle Strength  5/5 Arises from Table slowly Antalgic Gait   Neurological: She is alert and oriented to person, place, and time.  Skin: Skin is warm and dry.  Psychiatric: She has a normal mood and affect.  Nursing note and vitals reviewed.         Assessment & Plan:  1. Fibromyalgia Syndrome: Continue with exercise and heat therapy. Encouraged to increase activity as tolerated. 06/09/2017 2. Osteoarthritis of Bilateral Knees: Continue Voltaren Gel. 06/09/2017 Orthopedist FollowingContinue with heat and exercise therapy. S/P Left Knee Arthroplasty on 03/04/16  and S/P Right Knee Arthroplasty on 07/15/2016 by Dr. Mardelle Matte. 3. Left Shoulder OA: S/PLeft Shoulder Arthroplasty. Orthopedist Following. Dr. Mardelle Matte. 06/09/2017 4. Bipolar Syndrome: On Depakote and Cymbalta. PCP Following. 06/09/2017 5. Lumbar Spondylosis: 06/09/2017 Refilled: Oxycodone 15 mg one tablet three times a day #90.  We will continue the opioid monitoring program, this consists of regular clinic visits, examinations, urine drug screen, pill counts as well as use of [place] Controlled Substance Reporting System. 6. Muscle Spasms: Continue Bacolen TID as needed. 06/09/2017 7. Constipation: Continue Senna. No complaints today.06/09/2017 8. Peripheral Neuropathy: Continue: Lidocaine Patches. 06/09/2017 9.Bilateral OA of Both Hands/ Right Elbow: Continue: Voltaren Gel. 06/09/2017  20 minutes of face to face patient care time was spent during this visit. All questions were encouraged and answered.   F/U in 1 month

## 2017-06-10 ENCOUNTER — Telehealth: Payer: Self-pay | Admitting: Registered Nurse

## 2017-06-10 NOTE — Telephone Encounter (Signed)
Placed a call to Ms. Dix, she refuses referral for vocational rehabilitation at this time.

## 2017-07-07 ENCOUNTER — Encounter: Payer: Medicare HMO | Attending: Physical Medicine and Rehabilitation | Admitting: Physical Medicine & Rehabilitation

## 2017-07-07 ENCOUNTER — Encounter: Payer: Self-pay | Admitting: Physical Medicine & Rehabilitation

## 2017-07-07 VITALS — BP 105/74 | HR 97

## 2017-07-07 DIAGNOSIS — M129 Arthropathy, unspecified: Secondary | ICD-10-CM | POA: Diagnosis present

## 2017-07-07 DIAGNOSIS — M19019 Primary osteoarthritis, unspecified shoulder: Secondary | ICD-10-CM | POA: Insufficient documentation

## 2017-07-07 DIAGNOSIS — F5101 Primary insomnia: Secondary | ICD-10-CM | POA: Diagnosis not present

## 2017-07-07 DIAGNOSIS — M170 Bilateral primary osteoarthritis of knee: Secondary | ICD-10-CM

## 2017-07-07 DIAGNOSIS — M609 Myositis, unspecified: Secondary | ICD-10-CM | POA: Insufficient documentation

## 2017-07-07 DIAGNOSIS — F3170 Bipolar disorder, currently in remission, most recent episode unspecified: Secondary | ICD-10-CM | POA: Diagnosis not present

## 2017-07-07 DIAGNOSIS — M791 Myalgia: Secondary | ICD-10-CM | POA: Insufficient documentation

## 2017-07-07 DIAGNOSIS — M19012 Primary osteoarthritis, left shoulder: Secondary | ICD-10-CM | POA: Insufficient documentation

## 2017-07-07 DIAGNOSIS — M7521 Bicipital tendinitis, right shoulder: Secondary | ICD-10-CM

## 2017-07-07 DIAGNOSIS — Z79899 Other long term (current) drug therapy: Secondary | ICD-10-CM | POA: Diagnosis present

## 2017-07-07 DIAGNOSIS — Z5181 Encounter for therapeutic drug level monitoring: Secondary | ICD-10-CM | POA: Diagnosis present

## 2017-07-07 DIAGNOSIS — M12512 Traumatic arthropathy, left shoulder: Secondary | ICD-10-CM | POA: Insufficient documentation

## 2017-07-07 DIAGNOSIS — M47817 Spondylosis without myelopathy or radiculopathy, lumbosacral region: Secondary | ICD-10-CM

## 2017-07-07 MED ORDER — TRAZODONE HCL 50 MG PO TABS: 50.0000 mg | ORAL_TABLET | Freq: Every day | ORAL | 2 refills | Status: DC

## 2017-07-07 MED ORDER — OXYCODONE HCL 15 MG PO TABS: 15.0000 mg | ORAL_TABLET | Freq: Three times a day (TID) | ORAL | 0 refills | Status: DC | PRN

## 2017-07-07 NOTE — Patient Instructions (Signed)
PLEASE FEEL FREE TO CALL OUR OFFICE WITH ANY PROBLEMS OR QUESTIONS ([phone])      

## 2017-07-07 NOTE — Progress Notes (Signed)
Subjective:    Patient ID: [name], female    DOB: July 23, 1966, 51 y.o.   MRN: [id number]  HPI   [name] is here in follow up of her chronic pain. She has been working part time at [employer] and states things have been going fairly well. She also recently moved and all of the lifting flared up her right shoulder. She had really good results with shoulder injeciton from may  She remains on oxycodone for pain control. It dose cause her to itch. Benadryl makes her sleepy  Sleep is an issue. She use melatonin but it doesn't really help.   Pain Inventory Average Pain 7 Pain Right Now 7 My pain is sharp, stabbing and tingling  In the last 24 hours, has pain interfered with the following? General activity 7 Relation with others 5 Enjoyment of life 6 What TIME of day is your pain at its worst? evening Sleep (in general) Poor  Pain is worse with: walking and standing Pain improves with: rest and medication Relief from Meds: 6  Mobility do you drive?  yes  Function employed # of hrs/week 35  Neuro/Psych bladder control problems  Prior Studies Any changes since last visit?  no  Physicians involved in your care Any changes since last visit?  no   Family History  Problem Relation Age of Onset  . Schizophrenia Mother   . Hypertension Mother   . Alzheimer's disease Mother   . Stroke Father   . Diabetes Other   . Hypertension Other    Social History   Social History  . Marital status: Divorced    Spouse name: N/A  . Number of children: N/A  . Years of education: N/A   Social History Main Topics  . Smoking status: Current Every Day Smoker    Packs/day: 1.00    Years: 36.00    Types: Cigarettes  . Smokeless tobacco: Never Used  . Alcohol use 0.6 oz/week    1 Shots of liquor per week     Comment: occasional  . Drug use: No     Comment: goes to pain clinic  . Sexual activity: Not on file   Other Topics Concern  . Not on file   Social History Narrative    . No narrative on file   Past Surgical History:  Procedure Laterality Date  . ABDOMINAL HYSTERECTOMY  1998  . CARDIOVASCULAR STRESS TEST  03/2010   Lexiscan Myoview: EF 55%, mild breast attenuation, no ischemia or scar  . DILATION AND CURETTAGE OF UTERUS  1986   "lost a son"  . esophageal ulcer    . JOINT REPLACEMENT    . KNEE ARTHROSCOPY Right 2005  . TONSILLECTOMY    . TOTAL KNEE ARTHROPLASTY Left 03/04/2016   Procedure: TOTAL KNEE ARTHROPLASTY;  Surgeon: Marchia Bond, MD;  Location: [place];  Service: Orthopedics;  Laterality: Left;  . TOTAL KNEE ARTHROPLASTY Right 07/15/2016   Procedure: TOTAL KNEE ARTHROPLASTY;  Surgeon: Marchia Bond, MD;  Location: [place];  Service: Orthopedics;  Laterality: Right;  . TOTAL SHOULDER ARTHROPLASTY Left 07/17/2015   Procedure: TOTAL SHOULDER ARTHROPLASTY;  Surgeon: Marchia Bond, MD;  Location: [place];  Service: Orthopedics;  Laterality: Left;  . TRANSTHORACIC ECHOCARDIOGRAM  03/2010   EF 50-55%, normal LV size, no WMAs  . TUBAL LIGATION  1990   Past Medical History:  Diagnosis Date  . Anemia   . Anxiety   . Bipolar 1 disorder ([place])   . 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 reports that she is cured   . COPD (chronic obstructive pulmonary disease) ([place])   . Depression   . Fibromyalgia   . GERD (gastroesophageal reflux disease)   . Headache    migraine's in 20's  . Hypertension   . Insomnia   . Obesity   . Osteoarthritis    knees, elbows   . 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 Pneumonia    developed in 07/2015- post shoulder replacement   . 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 Primary localized osteoarthritis of left knee 03/04/2016  . Primary localized osteoarthritis of right knee 07/15/2016  . PUD (peptic ulcer disease)   . Spinal stenosis    There were no vitals taken for this visit.  Opioid Risk Score:   Fall Risk Score:  `1  Depression screen PHQ 2/9  Depression screen  [facility] 2/9 06/09/2017 05/08/2017 03/13/2017 01/01/2016 12/10/2015 06/25/2015 05/28/2015  Decreased Interest 2 2 2 2  0 0 1  Down, Depressed, Hopeless 1 1 1 1  0 0 1  PHQ - 2 Score 3 3 3 3  0 0 2  Altered sleeping - - - 1 - - -  Tired, decreased energy - - - 1 - - -  Change in appetite - - - 2 - - -  Feeling bad or failure about yourself  - - - 2 - - -  Trouble concentrating - - - 1 - - -  Moving slowly or fidgety/restless - - - 1 - - -  Suicidal thoughts - - - 0 - - -  PHQ-9 Score - - - 11 - - -  Difficult doing work/chores - - - Not difficult at all - - -     Review of Systems  Constitutional: Negative.   HENT: Negative.   Eyes: Negative.   Respiratory: Negative.   Cardiovascular: Negative.   Gastrointestinal: Negative.   Endocrine: Negative.   Genitourinary: Negative.   Musculoskeletal: Negative.   Skin: Negative.   Allergic/Immunologic: Negative.   Neurological: Negative.   Hematological: Negative.   Psychiatric/Behavioral: Negative.   All other systems reviewed and are negative.      Objective:   Physical Exam  Constitutional: She appears well-developed and well-nourished.  HENT:  Head: Normocephalic and atraumatic.  Neck: Normal range of motion.  Cardiovascular: RRR Pulmonary/Chest: normal effort.  Musculoskeletal:  Normal Muscle Bulk and Muscle Testing Reveals: Upper Extremities: Full ROM and Muscle Strength 5/5 Lumbar Paraspinal Tenderness: L-3- L-5 Lower Extremities: Full ROM and Muscle Strength 5/5 -some antalgia with wb.  Right shoulder tender with biceps tendon palp (L>S heads). Positive speeds test Neurological: She is alert.  Skin: Skin is warm and dry.  Psychiatric: affect pleasant .      Assessment & Plan:  1. Fibromyalgia Syndrome:              -maintain hep 2. Osteoarthritis of Bilateral Knees:  S/P:Left Knee Arthroplasty on 03/04/16 and right TKA 10/17 with Dr. Mardelle Matte 3. Left Shoulder OA: S/PLeft Shoulder Arthroplasty.perDr. Mardelle Matte 4.  Bipolar Syndrome: On Depakote and Cymbalta.  -doing fairly well in this area 5. Lumbar Spondylosis: Refilled: Oxycodone 15 mg one tablet three times a day #90.  -We will continue the opioid monitoring program, this consists of regular clinic visits, examinations, routine drug screening, pill counts as well as use of [place] Controlled Substance Reporting System. [place] today.    6. Insomnia: improved  7. Muscle Spasms: Continue [employer]  TID as needed  8. Right shoulder pain most c/w biceps tendonitis (short head)             -After informed consent and preparation of the skin with betadine and isopropyl alcohol, I injected 6mg  (1cc) of celestone and 4cc of 1% lidocaine around the long head biceps  via anterior approach. Additionally, aspiration was performed prior to injection. The patient tolerated well, and no complications were encountered. Afterward the area was cleaned and dressed. Post- injection instructions were provided.    9. Peripheral Neuropathy: Continue: Lidocaine Patches  71minutes of face to face patient care time was spent during this visit. All questions were encouraged and answered. NP to  follow up with patient next month

## 2017-08-04 ENCOUNTER — Encounter: Payer: Medicare HMO | Attending: Physical Medicine and Rehabilitation | Admitting: Registered Nurse

## 2017-08-04 ENCOUNTER — Encounter: Payer: Self-pay | Admitting: Registered Nurse

## 2017-08-04 VITALS — BP 119/84 | HR 94

## 2017-08-04 DIAGNOSIS — M19041 Primary osteoarthritis, right hand: Secondary | ICD-10-CM | POA: Diagnosis not present

## 2017-08-04 DIAGNOSIS — G609 Hereditary and idiopathic neuropathy, unspecified: Secondary | ICD-10-CM | POA: Diagnosis not present

## 2017-08-04 DIAGNOSIS — Z760 Encounter for issue of repeat prescription: Secondary | ICD-10-CM | POA: Insufficient documentation

## 2017-08-04 DIAGNOSIS — Z5181 Encounter for therapeutic drug level monitoring: Secondary | ICD-10-CM | POA: Diagnosis not present

## 2017-08-04 DIAGNOSIS — M62838 Other muscle spasm: Secondary | ICD-10-CM | POA: Insufficient documentation

## 2017-08-04 DIAGNOSIS — G894 Chronic pain syndrome: Secondary | ICD-10-CM

## 2017-08-04 DIAGNOSIS — M7062 Trochanteric bursitis, left hip: Secondary | ICD-10-CM | POA: Diagnosis not present

## 2017-08-04 DIAGNOSIS — J449 Chronic obstructive pulmonary disease, unspecified: Secondary | ICD-10-CM | POA: Diagnosis not present

## 2017-08-04 DIAGNOSIS — M19042 Primary osteoarthritis, left hand: Secondary | ICD-10-CM | POA: Diagnosis not present

## 2017-08-04 DIAGNOSIS — G4700 Insomnia, unspecified: Secondary | ICD-10-CM | POA: Diagnosis not present

## 2017-08-04 DIAGNOSIS — E669 Obesity, unspecified: Secondary | ICD-10-CM | POA: Insufficient documentation

## 2017-08-04 DIAGNOSIS — F17210 Nicotine dependence, cigarettes, uncomplicated: Secondary | ICD-10-CM | POA: Insufficient documentation

## 2017-08-04 DIAGNOSIS — G629 Polyneuropathy, unspecified: Secondary | ICD-10-CM | POA: Insufficient documentation

## 2017-08-04 DIAGNOSIS — M797 Fibromyalgia: Secondary | ICD-10-CM | POA: Diagnosis not present

## 2017-08-04 DIAGNOSIS — M19012 Primary osteoarthritis, left shoulder: Secondary | ICD-10-CM | POA: Insufficient documentation

## 2017-08-04 DIAGNOSIS — M25552 Pain in left hip: Secondary | ICD-10-CM | POA: Insufficient documentation

## 2017-08-04 DIAGNOSIS — G8929 Other chronic pain: Secondary | ICD-10-CM | POA: Insufficient documentation

## 2017-08-04 DIAGNOSIS — I10 Essential (primary) hypertension: Secondary | ICD-10-CM | POA: Diagnosis not present

## 2017-08-04 DIAGNOSIS — F419 Anxiety disorder, unspecified: Secondary | ICD-10-CM | POA: Insufficient documentation

## 2017-08-04 DIAGNOSIS — F3170 Bipolar disorder, currently in remission, most recent episode unspecified: Secondary | ICD-10-CM

## 2017-08-04 DIAGNOSIS — M47817 Spondylosis without myelopathy or radiculopathy, lumbosacral region: Secondary | ICD-10-CM | POA: Diagnosis not present

## 2017-08-04 DIAGNOSIS — K5900 Constipation, unspecified: Secondary | ICD-10-CM | POA: Insufficient documentation

## 2017-08-04 DIAGNOSIS — M7061 Trochanteric bursitis, right hip: Secondary | ICD-10-CM | POA: Diagnosis not present

## 2017-08-04 DIAGNOSIS — Z79899 Other long term (current) drug therapy: Secondary | ICD-10-CM | POA: Diagnosis not present

## 2017-08-04 DIAGNOSIS — M25551 Pain in right hip: Secondary | ICD-10-CM | POA: Insufficient documentation

## 2017-08-04 DIAGNOSIS — M170 Bilateral primary osteoarthritis of knee: Secondary | ICD-10-CM | POA: Insufficient documentation

## 2017-08-04 DIAGNOSIS — F319 Bipolar disorder, unspecified: Secondary | ICD-10-CM | POA: Insufficient documentation

## 2017-08-04 NOTE — Progress Notes (Signed)
Subjective:    Patient ID: [name], female    DOB: 1966-08-25, 51 y.o.   MRN: [id number]  HPI: [name] is a 51year old female who returns for follow up appointmentfor chronic pain and medication refill. She states her pain is located in her right shoulder radiating into her right arm and right hand with tingling and numbness, also reports lower back and bilateral hip  pain. She rates her pain 7.Her current exercise regime is walking.  [name] arrived to office in a somnolent state, she kept falling asleep during the entire visit. [name] reports she has noticed she has been having a hard time to arouse for the last week since she had increased her Melatonin to 5 mg since the 3 mg was ineffective. Placed a call to Pharmacy at [place] to discuss the above no interactions, it has the possibility to increase sedation and drowsiness. [name] stated her last dose of Oxycodone was on 08/03/2017 at [facility].   I spoke to [name] in detail regarding the above no Oxycodone prescription will be given today, oral swab was performed, she was instructed to discontinue the Melatonin. She may return to office in the morning for re-assessment she verbalizes understanding.  I spoke with Dr.  Naaman Walter regarding the above and he agrees with plan. [name] realizes if this occurs again we will have to wean her oxycodone, she verbalizes understanding.   [name] Morphine equivalent is 67.50 MME.  She is also prescribed Xanax  by Dr.Manning .We have discussed the black box warning of using opioids and benzodiazepines.I highlighted the dangers of using these drugs together and discussed the adverse events including respiratory suppression, overdose, cognitive impairment and importance of  compliance with current regimen. She verbalizes understanding, we will continue to monitor and adjust as indicated.     [name] is working part-time for [employer].  Last UDS  was performed on 05/08/2017, it was inconsistent, opiates were negative. Education provided and Ms. [name] realizes if this occurs again can lead to her being discharge, she verbalizes understanding.   Pain Inventory Average Pain 7 Pain Right Now 7 My pain is sharp, burning, stabbing, tingling and aching  In the last 24 hours, has pain interfered with the following? General activity 7 Relation with others 6 Enjoyment of life 6 What TIME of day is your pain at its worst? evening Sleep (in general) Poor  Pain is worse with: walking, bending and standing Pain improves with: rest, medication and injections Relief from Meds: 7  Mobility walk without assistance ability to climb steps?  yes do you drive?  yes  Function employed # of hrs/week 23 Do you have any goals in this area?  no  Neuro/Psych bladder control problems  Prior Studies Any changes since last visit?  no  Physicians involved in your care Any changes since last visit?  no   Family History  Problem Relation Age of Onset  . Schizophrenia Mother   . Hypertension Mother   . Alzheimer's disease Mother   . Stroke Father   . Diabetes Other   . Hypertension Other    Social History   Social History  . Marital status: Divorced    Spouse name: N/A  . Number of children: N/A  . Years of education: N/A   Social History Main Topics  . Smoking status: Current Every Day Smoker    Packs/day: 1.00    Years: 36.00  Types: Cigarettes  . Smokeless tobacco: Never Used  . Alcohol use 0.6 oz/week    1 Shots of liquor per week     Comment: occasional  . Drug use: No     Comment: goes to pain clinic  . Sexual activity: Not Asked   Other Topics Concern  . None   Social History Narrative  . None   Past Surgical History:  Procedure Laterality Date  . ABDOMINAL HYSTERECTOMY  1998  . CARDIOVASCULAR STRESS TEST  03/2010   Lexiscan Myoview: EF 55%, mild breast attenuation, no ischemia or scar  . DILATION AND  CURETTAGE OF UTERUS  1986   "lost a son"  . esophageal ulcer    . JOINT REPLACEMENT    . KNEE ARTHROSCOPY Right 2005  . TONSILLECTOMY    . TOTAL KNEE ARTHROPLASTY Left 03/04/2016   Procedure: TOTAL KNEE ARTHROPLASTY;  Surgeon: Marchia Bond, MD;  Location: [place];  Service: Orthopedics;  Laterality: Left;  . TOTAL KNEE ARTHROPLASTY Right 07/15/2016   Procedure: TOTAL KNEE ARTHROPLASTY;  Surgeon: Marchia Bond, MD;  Location: [place];  Service: Orthopedics;  Laterality: Right;  . TOTAL SHOULDER ARTHROPLASTY Left 07/17/2015   Procedure: TOTAL SHOULDER ARTHROPLASTY;  Surgeon: Marchia Bond, MD;  Location: [place];  Service: Orthopedics;  Laterality: Left;  . TRANSTHORACIC ECHOCARDIOGRAM  03/2010   EF 50-55%, normal LV size, no WMAs  . TUBAL LIGATION  1990   Past Medical History:  Diagnosis Date  . Anemia   . Anxiety   . Bipolar 1 disorder ([place])   . 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 reports that she is cured   . COPD (chronic obstructive pulmonary disease) ([place])   . Depression   . Fibromyalgia   . GERD (gastroesophageal reflux disease)   . Headache    migraine's in 20's  . Hypertension   . Insomnia   . Obesity   . Osteoarthritis    knees, elbows   . 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 Pneumonia    developed in 07/2015- post shoulder replacement   . 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 Primary localized osteoarthritis of left knee 03/04/2016  . Primary localized osteoarthritis of right knee 07/15/2016  . PUD (peptic ulcer disease)   . Spinal stenosis    BP 119/84   Pulse 94   SpO2 93%   Opioid Risk Score:  3 Fall Risk Score:  1  Depression screen PHQ 2/9  Depression screen [facility] 2/9 08/04/2017 06/09/2017 05/08/2017 03/13/2017 01/01/2016 12/10/2015 06/25/2015  Decreased Interest 2 2 2 2 2  0 0  Down, Depressed, Hopeless 1 1 1 1 1  0 0  PHQ - 2 Score 3 3 3 3 3  0 0  Altered sleeping - - - - 1 - -  Tired, decreased energy - - - - 1  - -  Change in appetite - - - - 2 - -  Feeling bad or failure about yourself  - - - - 2 - -  Trouble concentrating - - - - 1 - -  Moving slowly or fidgety/restless - - - - 1 - -  Suicidal thoughts - - - - 0 - -  PHQ-9 Score - - - - 11 - -  Difficult doing work/chores - - - - Not difficult at all - -    Review of Systems  Constitutional: Negative.   HENT: Negative.   Eyes: Negative.   Respiratory: Negative.   Cardiovascular:  Negative.   Gastrointestinal: Negative.   Endocrine: Negative.   Genitourinary:       Bladder control  Musculoskeletal: Negative.   Skin: Negative.   Allergic/Immunologic: Negative.   Neurological: Negative.   Hematological: Negative.   Psychiatric/Behavioral: Negative.   All other systems reviewed and are negative.      Objective:   Physical Exam  Constitutional: She is oriented to person, place, and time. She appears well-developed and well-nourished.  HENT:  Head: Normocephalic and atraumatic.  Neck: Normal range of motion. Neck supple.  Cervical Paraspinal Tenderness: C-5-C-6  Cardiovascular: Normal rate and regular rhythm.   Pulmonary/Chest: Effort normal and breath sounds normal.  Musculoskeletal:  Normal Muscle Bulk and Muscle Testing Reveals: Upper Extremities: Full ROM and Muscle Strength 5/5 Left AC Joint Tenderness Lumbar Hypersensitivity Lower Extremities: Full ROM and Muscle Strength 5/5 Arises from Table slowly Antalgic Gait   Neurological: She is alert and oriented to person, place, and time.  Skin: Skin is warm and dry.  Psychiatric: She has a normal mood and affect.  Nursing note and vitals reviewed.         Assessment & Plan:  1. Fibromyalgia Syndrome: Continue with exercise and heat therapy. Encouraged to increase activity as tolerated. 08/04/2017 2. Osteoarthritis of Bilateral Knees: Continue Voltaren Gel. 08/04/2017 Orthopedist FollowingContinue with heat and exercise therapy. S/P Left Knee Arthroplasty on  03/04/16 and S/P Right Knee Arthroplasty on 07/15/2016 by Dr. Mardelle Matte. 3. Left Shoulder OA: S/PLeft Shoulder Arthroplasty. Orthopedist Following. Dr. Mardelle Matte. 08/04/2017 4. Bipolar Syndrome: On Depakote and Cymbalta. PCP Following. 08/04/2017 5. Lumbar Spondylosis: 08/04/2017 Refilled: Prescription not given: On hold until re-assement on 08/05/2017. Oxycodone 15 mg one tablet three times a day #90.  We will continue the opioid monitoring program, this consists of regular clinic visits, examinations, urine drug screen, pill counts as well as use of [place] Controlled Substance Reporting System. 6. Muscle Spasms: Continue Bacolen TID as needed. 08/04/2017 7. Constipation: Continue Senna. No complaints today.08/04/2017 8. Peripheral Neuropathy: Continue: Lidocaine Patches. 08/04/2017 9.Bilateral OA of Both Hands/ Right Elbow: Continue: Voltaren Gel. 08/04/2017  20 minutes of face to face patient care time was spent during this visit. All questions were encouraged and answered.   F/U in 1 month

## 2017-08-05 ENCOUNTER — Telehealth: Payer: Self-pay | Admitting: Registered Nurse

## 2017-08-05 MED ORDER — OXYCODONE HCL 15 MG PO TABS: 15.0000 mg | ORAL_TABLET | Freq: Three times a day (TID) | ORAL | 0 refills | Status: DC | PRN

## 2017-08-05 NOTE — Telephone Encounter (Signed)
On 08/05/2017 the  [place] was reviewed no conflict was seen on the [place] with multiple prescribers. [name] has a signed narcotic contract with our office. If there were any discrepancies this would have been reported to her physician.

## 2017-08-05 NOTE — Telephone Encounter (Signed)
[name] arrived to office today, A&O x 3, no somnolence noted, she stated "she threw her melatonin away yesterday. VSS: 106/68, P-92, Sat's 92%.  Prescription Printed:  Reviewed with [name] yesterday occurrence and she verbalizes understanding.

## 2017-08-09 LAB — DRUG TOX MONITOR 1 W/CONF, ORAL FLD
Amphetamines: NEGATIVE ng/mL (ref <10)
BARBITURATES: NEGATIVE ng/mL (ref <10)
BENZODIAZEPINES: NEGATIVE ng/mL (ref <0.50)
BUPRENORPHINE: NEGATIVE ng/mL (ref <0.10)
CODEINE: NEGATIVE ng/mL (ref <2.5)
COTININE: 5.8 ng/mL — AB (ref <5.0)
Cocaine: NEGATIVE ng/mL (ref <5.0)
DIHYDROCODEINE: NEGATIVE ng/mL (ref <2.5)
FENTANYL: NEGATIVE ng/mL (ref <0.10)
HEROIN METABOLITE: NEGATIVE ng/mL (ref <1.0)
Hydrocodone: NEGATIVE ng/mL (ref <2.5)
Hydromorphone: NEGATIVE ng/mL (ref <2.5)
MARIJUANA: NEGATIVE ng/mL (ref <2.5)
MDMA: NEGATIVE ng/mL (ref <10)
Meprobamate: NEGATIVE ng/mL (ref <2.5)
Methadone: NEGATIVE ng/mL (ref <5.0)
Morphine: NEGATIVE ng/mL (ref <2.5)
NICOTINE METABOLITE: POSITIVE ng/mL — AB (ref <5.0)
Norhydrocodone: NEGATIVE ng/mL (ref <2.5)
Noroxycodone: NEGATIVE ng/mL (ref <2.5)
OXYCODONE: 3 ng/mL — AB (ref <2.5)
Opiates: POSITIVE ng/mL — AB (ref <2.5)
Oxymorphone: NEGATIVE ng/mL (ref <2.5)
PHENCYCLIDINE: NEGATIVE ng/mL (ref <10)
Tapentadol: NEGATIVE ng/mL (ref <5.0)
Tramadol: NEGATIVE ng/mL (ref <5.0)
ZOLPIDEM: NEGATIVE ng/mL (ref <5.0)

## 2017-08-09 LAB — DRUG TOX ALC METAB W/CON, ORAL FLD: Alcohol Metabolite: NEGATIVE ng/mL (ref <25)

## 2017-08-13 ENCOUNTER — Telehealth: Payer: Self-pay | Admitting: *Deleted

## 2017-08-13 NOTE — Telephone Encounter (Signed)
Oral swab drug screen was consistent for prescribed medications.  ?

## 2017-09-01 ENCOUNTER — Encounter: Payer: Medicare HMO | Admitting: Registered Nurse

## 2017-09-01 ENCOUNTER — Encounter: Payer: Self-pay | Admitting: Registered Nurse

## 2017-09-01 ENCOUNTER — Encounter: Payer: Medicare HMO | Attending: Physical Medicine and Rehabilitation | Admitting: Registered Nurse

## 2017-09-01 ENCOUNTER — Other Ambulatory Visit: Payer: Self-pay

## 2017-09-01 VITALS — BP 111/81 | HR 115

## 2017-09-01 DIAGNOSIS — M170 Bilateral primary osteoarthritis of knee: Secondary | ICD-10-CM | POA: Insufficient documentation

## 2017-09-01 DIAGNOSIS — M19042 Primary osteoarthritis, left hand: Secondary | ICD-10-CM | POA: Insufficient documentation

## 2017-09-01 DIAGNOSIS — I10 Essential (primary) hypertension: Secondary | ICD-10-CM | POA: Insufficient documentation

## 2017-09-01 DIAGNOSIS — K5900 Constipation, unspecified: Secondary | ICD-10-CM | POA: Insufficient documentation

## 2017-09-01 DIAGNOSIS — M25551 Pain in right hip: Secondary | ICD-10-CM | POA: Insufficient documentation

## 2017-09-01 DIAGNOSIS — F419 Anxiety disorder, unspecified: Secondary | ICD-10-CM | POA: Insufficient documentation

## 2017-09-01 DIAGNOSIS — G8929 Other chronic pain: Secondary | ICD-10-CM | POA: Insufficient documentation

## 2017-09-01 DIAGNOSIS — M19012 Primary osteoarthritis, left shoulder: Secondary | ICD-10-CM | POA: Insufficient documentation

## 2017-09-01 DIAGNOSIS — M25552 Pain in left hip: Secondary | ICD-10-CM

## 2017-09-01 DIAGNOSIS — G4700 Insomnia, unspecified: Secondary | ICD-10-CM | POA: Diagnosis not present

## 2017-09-01 DIAGNOSIS — F17210 Nicotine dependence, cigarettes, uncomplicated: Secondary | ICD-10-CM | POA: Insufficient documentation

## 2017-09-01 DIAGNOSIS — G629 Polyneuropathy, unspecified: Secondary | ICD-10-CM | POA: Diagnosis not present

## 2017-09-01 DIAGNOSIS — M797 Fibromyalgia: Secondary | ICD-10-CM | POA: Diagnosis not present

## 2017-09-01 DIAGNOSIS — M7062 Trochanteric bursitis, left hip: Secondary | ICD-10-CM

## 2017-09-01 DIAGNOSIS — F319 Bipolar disorder, unspecified: Secondary | ICD-10-CM | POA: Diagnosis not present

## 2017-09-01 DIAGNOSIS — M19041 Primary osteoarthritis, right hand: Secondary | ICD-10-CM | POA: Diagnosis not present

## 2017-09-01 DIAGNOSIS — Z760 Encounter for issue of repeat prescription: Secondary | ICD-10-CM | POA: Diagnosis not present

## 2017-09-01 DIAGNOSIS — M47817 Spondylosis without myelopathy or radiculopathy, lumbosacral region: Secondary | ICD-10-CM | POA: Insufficient documentation

## 2017-09-01 DIAGNOSIS — M62838 Other muscle spasm: Secondary | ICD-10-CM | POA: Insufficient documentation

## 2017-09-01 DIAGNOSIS — E669 Obesity, unspecified: Secondary | ICD-10-CM | POA: Diagnosis not present

## 2017-09-01 DIAGNOSIS — Z79899 Other long term (current) drug therapy: Secondary | ICD-10-CM | POA: Insufficient documentation

## 2017-09-01 DIAGNOSIS — Z5181 Encounter for therapeutic drug level monitoring: Secondary | ICD-10-CM | POA: Diagnosis not present

## 2017-09-01 DIAGNOSIS — J449 Chronic obstructive pulmonary disease, unspecified: Secondary | ICD-10-CM | POA: Diagnosis not present

## 2017-09-01 DIAGNOSIS — G609 Hereditary and idiopathic neuropathy, unspecified: Secondary | ICD-10-CM | POA: Diagnosis not present

## 2017-09-01 MED ORDER — BACLOFEN 10 MG PO TABS: 10.0000 mg | ORAL_TABLET | Freq: Three times a day (TID) | ORAL | 2 refills | Status: DC | PRN

## 2017-09-01 MED ORDER — OXYCODONE HCL 15 MG PO TABS: 15.0000 mg | ORAL_TABLET | Freq: Three times a day (TID) | ORAL | 0 refills | Status: DC | PRN

## 2017-09-01 NOTE — Progress Notes (Signed)
Subjective:    Patient ID: [name], female    DOB: 1965-12-08, 51 y.o.   MRN: [id number]  HPI: [name] is a 51year old female who returns for follow up appointmentfor chronic pain and medication refill. She states her pain is located in her right shoulder radiating into her right arm and right hand with tingling and numbness, also has lower back pain and left hip hip  Pain. She rates her pain 7.   [name] states she lost her footing three weeks ago while entering her home when it was raining, she slid on the wet floor and landed on her left hip. She refuses X-ray at this time due to financial hardship, will continue to monitor. No ecchymosis noted. She rates her pain 7.Her current exercise regime is walking.  [name] equivalent is 67.50 MME.  She is also prescribed Xanax  by Dr.Manning .We have reviewed the black box warning again in regards of using opioids and benzodiazepines.I highlighted the dangers of using these drugs together and discussed the adverse events including respiratory suppression, overdose, cognitive impairment and importance of  compliance with current regimen. She verbalizes understanding, we will continue to monitor and adjust as indicated.     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   Oral Swab was performed on 08/04/2017, it was consistent.  Pain Inventory Average Pain 7 Pain Right Now 7 My pain is sharp, burning, stabbing, tingling and aching  In the last 24 hours, has pain interfered with the following? General activity 7 Relation with others 6 Enjoyment of life 6 What TIME of day is your pain at its worst? evening Sleep (in general) Poor  Pain is worse with: walking, bending and standing Pain improves with: rest, medication and injections Relief from Meds: 7  Mobility walk without assistance ability to climb steps?  yes do you drive?  yes  Function employed # of hrs/week 23 Do you have any goals in this area?   no  Neuro/Psych bladder control problems  Prior Studies Any changes since last visit?  no  Physicians involved in your care Any changes since last visit?  no   Family History  Problem Relation Age of Onset  . Schizophrenia Mother   . Hypertension Mother   . Alzheimer's disease Mother   . Stroke Father   . Diabetes Other   . Hypertension Other    Social History   Socioeconomic History  . Marital status: Divorced    Spouse name: None  . Number of children: None  . Years of education: None  . Highest education level: None  Social Needs  . Financial resource strain: None  . Food insecurity - worry: None  . Food insecurity - inability: None  . Transportation needs - medical: None  . Transportation needs - non-medical: None  Occupational History  . None  Tobacco Use  . Smoking status: Current Every Day Smoker    Packs/day: 1.00    Years: 36.00    Pack years: 36.00    Types: Cigarettes  . Smokeless tobacco: Never Used  Substance and Sexual Activity  . Alcohol use: Yes    Alcohol/week: 0.6 oz    Types: 1 Shots of liquor per week    Comment: occasional  . Drug use: No    Comment: goes to pain clinic  . Sexual activity: None  Other Topics Concern  . None  Social History Narrative  . None   Past Surgical History:  Procedure Laterality Date  . ABDOMINAL HYSTERECTOMY  1998  . CARDIOVASCULAR STRESS TEST  03/2010   Lexiscan Myoview: EF 55%, mild breast attenuation, no ischemia or scar  . DILATION AND CURETTAGE OF UTERUS  1986   "lost a son"  . esophageal ulcer    . JOINT REPLACEMENT    . KNEE ARTHROSCOPY Right 2005  . TONSILLECTOMY    . TOTAL KNEE ARTHROPLASTY Left 03/04/2016   Procedure: TOTAL KNEE ARTHROPLASTY;  Surgeon: Marchia Bond, MD;  Location: [place];  Service: Orthopedics;  Laterality: Left;  . TOTAL KNEE ARTHROPLASTY Right 07/15/2016   Procedure: TOTAL KNEE ARTHROPLASTY;  Surgeon: Marchia Bond, MD;  Location: [place];  Service: Orthopedics;  Laterality:  Right;  . TOTAL SHOULDER ARTHROPLASTY Left 07/17/2015   Procedure: TOTAL SHOULDER ARTHROPLASTY;  Surgeon: Marchia Bond, MD;  Location: [place];  Service: Orthopedics;  Laterality: Left;  . TRANSTHORACIC ECHOCARDIOGRAM  03/2010   EF 50-55%, normal LV size, no WMAs  . TUBAL LIGATION  1990   Past Medical History:  Diagnosis Date  . Anemia   . Anxiety   . Bipolar 1 disorder ([place])   . 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 reports that she is cured   . COPD (chronic obstructive pulmonary disease) ([place])   . Depression   . Fibromyalgia   . GERD (gastroesophageal reflux disease)   . Headache    migraine's in 20's  . Hypertension   . Insomnia   . Obesity   . Osteoarthritis    knees, elbows   . 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 Pneumonia    developed in 07/2015- post shoulder replacement   . 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 Primary localized osteoarthritis of left knee 03/04/2016  . Primary localized osteoarthritis of right knee 07/15/2016  . PUD (peptic ulcer disease)   . Spinal stenosis    BP 111/81   Pulse (!) 115   SpO2 95%   Opioid Risk Score:  3 Fall Risk Score:  1  Depression screen PHQ 2/9  Depression screen [facility] 2/9 09/01/2017 08/04/2017 06/09/2017 05/08/2017 03/13/2017 01/01/2016 12/10/2015  Decreased Interest 0 2 2 2 2 2  0  Down, Depressed, Hopeless 0 1 1 1 1 1  0  PHQ - 2 Score 0 3 3 3 3 3  0  Altered sleeping - - - - - 1 -  Tired, decreased energy - - - - - 1 -  Change in appetite - - - - - 2 -  Feeling bad or failure about yourself  - - - - - 2 -  Trouble concentrating - - - - - 1 -  Moving slowly or fidgety/restless - - - - - 1 -  Suicidal thoughts - - - - - 0 -  PHQ-9 Score - - - - - 11 -  Difficult doing work/chores - - - - - Not difficult at all -    Review of Systems  Constitutional: Negative.   HENT: Negative.   Eyes: Negative.   Respiratory: Negative.   Cardiovascular: Negative.     Gastrointestinal: Negative.   Endocrine: Negative.   Genitourinary:       Bladder control  Musculoskeletal: Negative.   Skin: Negative.   Allergic/Immunologic: Negative.   Neurological: Negative.   Hematological: Negative.   Psychiatric/Behavioral: Negative.   All other systems reviewed and are negative.      Objective:   Physical Exam  Constitutional: She is oriented to person,  place, and time. She appears well-developed and well-nourished.  HENT:  Head: Normocephalic and atraumatic.  Neck: Normal range of motion. Neck supple.  Cervical Paraspinal Tenderness: C-5-C-6  Cardiovascular: Normal rate and regular rhythm.  Pulmonary/Chest: Effort normal and breath sounds normal.  Musculoskeletal:  Normal Muscle Bulk and Muscle Testing Reveals: Upper Extremities: Full ROM and Muscle Strength 5/5 Left AC Joint Tenderness Lumbar Hypersensitivity Lower Extremities: Full ROM and Muscle Strength 5/5 Arises from Table slowly Antalgic Gait   Neurological: She is alert and oriented to person, place, and time.  Skin: Skin is warm and dry.  Psychiatric: She has a normal mood and affect.  Nursing note and vitals reviewed.         Assessment & Plan:  1. Fibromyalgia Syndrome: Continue with exercise and heat therapy. Encouraged to increase activity as tolerated. 09/01/2017 2. Osteoarthritis of Bilateral Knees: Continue Voltaren Gel. 09/01/2017 Orthopedist FollowingContinue with heat and exercise therapy. S/P Left Knee Arthroplasty on 03/04/16 and S/P Right Knee Arthroplasty on 07/15/2016 by Dr. Mardelle Matte. 3. Left Shoulder OA: S/PLeft Shoulder Arthroplasty. Orthopedist Following. Dr. Mardelle Matte. 09/01/2017 4. Bipolar Syndrome: On Depakote and Cymbalta. PCP Following. 09/01/2017 5. Lumbar Spondylosis: 09/01/2017 Refilled:  Oxycodone 15 mg one tablet three times a day #90.  We will continue the opioid monitoring program, this consists of regular clinic visits, examinations, urine drug  screen, pill counts as well as use of [place] Controlled Substance Reporting System. 6. Muscle Spasms: Continue Bacolen TID as needed. 09/01/2017 7. Constipation: Continue Senna. 09/01/2017 8. Peripheral Neuropathy: Continue: Lidocaine Patches. 09/01/2017 9.Bilateral OA of Both Hands/ Right Elbow: Continue: Voltaren Gel. 09/01/2017  20 minutes of face to face patient care time was spent during this visit. All questions were encouraged and answered.   F/U in 1 month

## 2017-09-29 ENCOUNTER — Encounter: Payer: Medicare HMO | Admitting: Registered Nurse

## 2017-09-30 ENCOUNTER — Encounter: Payer: Self-pay | Admitting: Registered Nurse

## 2017-09-30 ENCOUNTER — Encounter: Payer: Medicare HMO | Attending: Physical Medicine and Rehabilitation | Admitting: Registered Nurse

## 2017-09-30 ENCOUNTER — Telehealth: Payer: Self-pay

## 2017-09-30 VITALS — BP 113/78 | HR 88

## 2017-09-30 DIAGNOSIS — M7061 Trochanteric bursitis, right hip: Secondary | ICD-10-CM

## 2017-09-30 DIAGNOSIS — G609 Hereditary and idiopathic neuropathy, unspecified: Secondary | ICD-10-CM | POA: Diagnosis not present

## 2017-09-30 DIAGNOSIS — Z5181 Encounter for therapeutic drug level monitoring: Secondary | ICD-10-CM | POA: Diagnosis not present

## 2017-09-30 DIAGNOSIS — F419 Anxiety disorder, unspecified: Secondary | ICD-10-CM | POA: Insufficient documentation

## 2017-09-30 DIAGNOSIS — F17210 Nicotine dependence, cigarettes, uncomplicated: Secondary | ICD-10-CM | POA: Insufficient documentation

## 2017-09-30 DIAGNOSIS — M19041 Primary osteoarthritis, right hand: Secondary | ICD-10-CM | POA: Diagnosis not present

## 2017-09-30 DIAGNOSIS — M25551 Pain in right hip: Secondary | ICD-10-CM | POA: Diagnosis not present

## 2017-09-30 DIAGNOSIS — M19042 Primary osteoarthritis, left hand: Secondary | ICD-10-CM | POA: Diagnosis not present

## 2017-09-30 DIAGNOSIS — G8929 Other chronic pain: Secondary | ICD-10-CM | POA: Diagnosis present

## 2017-09-30 DIAGNOSIS — G4700 Insomnia, unspecified: Secondary | ICD-10-CM | POA: Insufficient documentation

## 2017-09-30 DIAGNOSIS — M62838 Other muscle spasm: Secondary | ICD-10-CM | POA: Diagnosis not present

## 2017-09-30 DIAGNOSIS — M25552 Pain in left hip: Secondary | ICD-10-CM | POA: Diagnosis not present

## 2017-09-30 DIAGNOSIS — I10 Essential (primary) hypertension: Secondary | ICD-10-CM | POA: Diagnosis not present

## 2017-09-30 DIAGNOSIS — G629 Polyneuropathy, unspecified: Secondary | ICD-10-CM | POA: Insufficient documentation

## 2017-09-30 DIAGNOSIS — M19012 Primary osteoarthritis, left shoulder: Secondary | ICD-10-CM | POA: Insufficient documentation

## 2017-09-30 DIAGNOSIS — F319 Bipolar disorder, unspecified: Secondary | ICD-10-CM | POA: Insufficient documentation

## 2017-09-30 DIAGNOSIS — M47817 Spondylosis without myelopathy or radiculopathy, lumbosacral region: Secondary | ICD-10-CM | POA: Diagnosis not present

## 2017-09-30 DIAGNOSIS — M7062 Trochanteric bursitis, left hip: Secondary | ICD-10-CM

## 2017-09-30 DIAGNOSIS — Z79899 Other long term (current) drug therapy: Secondary | ICD-10-CM | POA: Diagnosis not present

## 2017-09-30 DIAGNOSIS — M797 Fibromyalgia: Secondary | ICD-10-CM | POA: Diagnosis not present

## 2017-09-30 DIAGNOSIS — G894 Chronic pain syndrome: Secondary | ICD-10-CM

## 2017-09-30 DIAGNOSIS — J449 Chronic obstructive pulmonary disease, unspecified: Secondary | ICD-10-CM | POA: Insufficient documentation

## 2017-09-30 DIAGNOSIS — E669 Obesity, unspecified: Secondary | ICD-10-CM | POA: Insufficient documentation

## 2017-09-30 DIAGNOSIS — Z760 Encounter for issue of repeat prescription: Secondary | ICD-10-CM | POA: Diagnosis not present

## 2017-09-30 DIAGNOSIS — M170 Bilateral primary osteoarthritis of knee: Secondary | ICD-10-CM | POA: Diagnosis not present

## 2017-09-30 DIAGNOSIS — K5900 Constipation, unspecified: Secondary | ICD-10-CM | POA: Insufficient documentation

## 2017-09-30 MED ORDER — LIDOCAINE 5 % EX PTCH: MEDICATED_PATCH | CUTANEOUS | 2 refills | Status: AC

## 2017-09-30 MED ORDER — DULOXETINE HCL 60 MG PO CPEP: ORAL_CAPSULE | ORAL | 3 refills | Status: DC

## 2017-09-30 MED ORDER — OXYCODONE HCL 15 MG PO TABS: 15.0000 mg | ORAL_TABLET | Freq: Three times a day (TID) | ORAL | 0 refills | Status: DC | PRN

## 2017-09-30 NOTE — Telephone Encounter (Signed)
Return [name] call, she will pick up her 10 day supply at [facility] and her full prescription on 10/10/18 at [facility]
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
## 2017-09-30 NOTE — Telephone Encounter (Signed)
Patient called stating that her insurance wont go through for [facility] and that she will have to go to [facility]. She stated she is going to hang onto the prescription until the 29th.

## 2017-10-02 ENCOUNTER — Telehealth: Payer: Self-pay

## 2017-10-02 NOTE — Telephone Encounter (Signed)
Prior authorization returned today as being denied, reexamined her chart and found where it was approved in January 10-29-2015, reused same information from that acceptance and submitted appeal today 10-02-2017.

## 2017-10-02 NOTE — Telephone Encounter (Signed)
Prior auth started today for lidoderm patches 10-02-2018

## 2017-10-09 NOTE — Telephone Encounter (Signed)
Appeal approved, pharmacy notified, patient notified

## 2017-10-27 ENCOUNTER — Ambulatory Visit: Payer: Medicare HMO | Admitting: Registered Nurse

## 2017-10-28 ENCOUNTER — Encounter: Payer: Medicare HMO | Attending: Physical Medicine and Rehabilitation | Admitting: Registered Nurse

## 2017-10-28 ENCOUNTER — Encounter: Payer: Self-pay | Admitting: Registered Nurse

## 2017-10-28 ENCOUNTER — Other Ambulatory Visit: Payer: Self-pay

## 2017-10-28 VITALS — BP 126/85 | HR 100

## 2017-10-28 DIAGNOSIS — M47817 Spondylosis without myelopathy or radiculopathy, lumbosacral region: Secondary | ICD-10-CM | POA: Diagnosis not present

## 2017-10-28 DIAGNOSIS — M7062 Trochanteric bursitis, left hip: Secondary | ICD-10-CM

## 2017-10-28 DIAGNOSIS — Z79899 Other long term (current) drug therapy: Secondary | ICD-10-CM | POA: Insufficient documentation

## 2017-10-28 DIAGNOSIS — M62838 Other muscle spasm: Secondary | ICD-10-CM | POA: Insufficient documentation

## 2017-10-28 DIAGNOSIS — G629 Polyneuropathy, unspecified: Secondary | ICD-10-CM | POA: Insufficient documentation

## 2017-10-28 DIAGNOSIS — M47816 Spondylosis without myelopathy or radiculopathy, lumbar region: Secondary | ICD-10-CM

## 2017-10-28 DIAGNOSIS — M797 Fibromyalgia: Secondary | ICD-10-CM | POA: Diagnosis not present

## 2017-10-28 DIAGNOSIS — M19042 Primary osteoarthritis, left hand: Secondary | ICD-10-CM | POA: Insufficient documentation

## 2017-10-28 DIAGNOSIS — F319 Bipolar disorder, unspecified: Secondary | ICD-10-CM | POA: Diagnosis not present

## 2017-10-28 DIAGNOSIS — G609 Hereditary and idiopathic neuropathy, unspecified: Secondary | ICD-10-CM

## 2017-10-28 DIAGNOSIS — K5900 Constipation, unspecified: Secondary | ICD-10-CM | POA: Diagnosis not present

## 2017-10-28 DIAGNOSIS — M7521 Bicipital tendinitis, right shoulder: Secondary | ICD-10-CM

## 2017-10-28 DIAGNOSIS — Z760 Encounter for issue of repeat prescription: Secondary | ICD-10-CM | POA: Insufficient documentation

## 2017-10-28 DIAGNOSIS — G4700 Insomnia, unspecified: Secondary | ICD-10-CM | POA: Insufficient documentation

## 2017-10-28 DIAGNOSIS — J449 Chronic obstructive pulmonary disease, unspecified: Secondary | ICD-10-CM | POA: Insufficient documentation

## 2017-10-28 DIAGNOSIS — G8929 Other chronic pain: Secondary | ICD-10-CM | POA: Diagnosis not present

## 2017-10-28 DIAGNOSIS — F419 Anxiety disorder, unspecified: Secondary | ICD-10-CM | POA: Diagnosis not present

## 2017-10-28 DIAGNOSIS — G894 Chronic pain syndrome: Secondary | ICD-10-CM | POA: Diagnosis not present

## 2017-10-28 DIAGNOSIS — M25552 Pain in left hip: Secondary | ICD-10-CM | POA: Diagnosis not present

## 2017-10-28 DIAGNOSIS — M19041 Primary osteoarthritis, right hand: Secondary | ICD-10-CM | POA: Diagnosis not present

## 2017-10-28 DIAGNOSIS — M25551 Pain in right hip: Secondary | ICD-10-CM | POA: Diagnosis not present

## 2017-10-28 DIAGNOSIS — M19012 Primary osteoarthritis, left shoulder: Secondary | ICD-10-CM | POA: Insufficient documentation

## 2017-10-28 DIAGNOSIS — F17210 Nicotine dependence, cigarettes, uncomplicated: Secondary | ICD-10-CM | POA: Diagnosis not present

## 2017-10-28 DIAGNOSIS — M170 Bilateral primary osteoarthritis of knee: Secondary | ICD-10-CM | POA: Insufficient documentation

## 2017-10-28 DIAGNOSIS — E669 Obesity, unspecified: Secondary | ICD-10-CM | POA: Diagnosis not present

## 2017-10-28 DIAGNOSIS — Z5181 Encounter for therapeutic drug level monitoring: Secondary | ICD-10-CM | POA: Insufficient documentation

## 2017-10-28 DIAGNOSIS — I10 Essential (primary) hypertension: Secondary | ICD-10-CM | POA: Insufficient documentation

## 2017-10-28 MED ORDER — OXYCODONE HCL 15 MG PO TABS: 15.0000 mg | ORAL_TABLET | Freq: Three times a day (TID) | ORAL | 0 refills | Status: DC | PRN

## 2017-10-28 NOTE — Progress Notes (Signed)
Subjective:    Patient ID: [name], female    DOB: August 31, 1966, 52 y.o.   MRN: [id number]  HPI: [name] is a 52year old female who returns for follow up appointmentfor chronic pain and medication refill. She states her pain is located in her right shoulder, lower back and left hip. Also reports her left hip pain has increased in intensity over the last three months. She had a fall a few months ago and never went for her X-rays, we will order a left hip X-ray today, she verbalizes understanding. Also has pain in her bilateral hands with tingling and numbness. She rates her pain 7.   [name] Morphine equivalent is 92.25 MME.  She is also prescribed Xanax  by Dr.Manning .We have reviewed the black box warning again in regards of using opioids and benzodiazepines.I highlighted the dangers of using these drugs together and discussed the adverse events including respiratory suppression, overdose, cognitive impairment and importance of  compliance with current regimen. She verbalizes understanding, we will continue to monitor and adjust as indicated.    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  Oral Swab was performed on 08/04/2017, it was consistent.   Pain Inventory Average Pain 7 Pain Right Now 7 My pain is burning, stabbing and aching  In the last 24 hours, has pain interfered with the following? General activity 7 Relation with others 7 Enjoyment of life 7 What TIME of day is your pain at its worst? evening Sleep (in general) Fair  Pain is worse with: walking and some activites Pain improves with: rest and medication Relief from Meds: 8  Mobility walk without assistance ability to climb steps?  yes do you drive?  yes Do you have any goals in this area?  no  Function employed # of hrs/week 25 Do you have any goals in this area?  no  Neuro/Psych bladder control problems tremor tingling  Prior Studies Any changes since last visit?  no  Physicians  involved in your care Any changes since last visit?  no   Family History  Problem Relation Age of Onset  . Schizophrenia Mother   . Hypertension Mother   . Alzheimer's disease Mother   . Stroke Father   . Diabetes Other   . Hypertension Other    Social History   Socioeconomic History  . Marital status: Divorced    Spouse name: None  . Number of children: None  . Years of education: None  . Highest education level: None  Social Needs  . Financial resource strain: None  . Food insecurity - worry: None  . Food insecurity - inability: None  . Transportation needs - medical: None  . Transportation needs - non-medical: None  Occupational History  . None  Tobacco Use  . Smoking status: Current Every Day Smoker    Packs/day: 1.00    Years: 36.00    Pack years: 36.00    Types: Cigarettes  . Smokeless tobacco: Never Used  Substance and Sexual Activity  . Alcohol use: Yes    Alcohol/week: 0.6 oz    Types: 1 Shots of liquor per week    Comment: occasional  . Drug use: No    Comment: goes to pain clinic  . Sexual activity: None  Other Topics Concern  . None  Social History Narrative  . None   Past Surgical History:  Procedure Laterality Date  . ABDOMINAL HYSTERECTOMY  1998  . CARDIOVASCULAR STRESS TEST  03/2010  Lexiscan Myoview: EF 55%, mild breast attenuation, no ischemia or scar  . DILATION AND CURETTAGE OF UTERUS  1986   "lost a son"  . esophageal ulcer    . JOINT REPLACEMENT    . KNEE ARTHROSCOPY Right 2005  . TONSILLECTOMY    . TOTAL KNEE ARTHROPLASTY Left 03/04/2016   Procedure: TOTAL KNEE ARTHROPLASTY;  Surgeon: Marchia Bond, MD;  Location: [place];  Service: Orthopedics;  Laterality: Left;  . TOTAL KNEE ARTHROPLASTY Right 07/15/2016   Procedure: TOTAL KNEE ARTHROPLASTY;  Surgeon: Marchia Bond, MD;  Location: [place];  Service: Orthopedics;  Laterality: Right;  . TOTAL SHOULDER ARTHROPLASTY Left 07/17/2015   Procedure: TOTAL SHOULDER ARTHROPLASTY;  Surgeon:  Marchia Bond, MD;  Location: [place];  Service: Orthopedics;  Laterality: Left;  . TRANSTHORACIC ECHOCARDIOGRAM  03/2010   EF 50-55%, normal LV size, no WMAs  . TUBAL LIGATION  1990   Past Medical History:  Diagnosis Date  . Anemia   . Anxiety   . Bipolar 1 disorder ([place])   . 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 reports that she is cured   . COPD (chronic obstructive pulmonary disease) ([place])   . Depression   . Fibromyalgia   . GERD (gastroesophageal reflux disease)   . Headache    migraine's in 20's  . Hypertension   . Insomnia   . Obesity   . Osteoarthritis    knees, elbows   . 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 Pneumonia    developed in 07/2015- post shoulder replacement   . 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 Primary localized osteoarthritis of left knee 03/04/2016  . Primary localized osteoarthritis of right knee 07/15/2016  . PUD (peptic ulcer disease)   . Spinal stenosis    There were no vitals taken for this visit.  Opioid Risk Score:  3 Fall Risk Score:  1  Depression screen PHQ 2/9  Depression screen [facility] 2/9 09/01/2017 08/04/2017 06/09/2017 05/08/2017 03/13/2017 01/01/2016 12/10/2015  Decreased Interest 0 2 2 2 2 2  0  Down, Depressed, Hopeless 0 1 1 1 1 1  0  PHQ - 2 Score 0 3 3 3 3 3  0  Altered sleeping - - - - - 1 -  Tired, decreased energy - - - - - 1 -  Change in appetite - - - - - 2 -  Feeling bad or failure about yourself  - - - - - 2 -  Trouble concentrating - - - - - 1 -  Moving slowly or fidgety/restless - - - - - 1 -  Suicidal thoughts - - - - - 0 -  PHQ-9 Score - - - - - 11 -  Difficult doing work/chores - - - - - Not difficult at all -    Review of Systems  Constitutional: Negative.   HENT: Negative.   Eyes: Negative.   Respiratory: Negative.   Cardiovascular: Negative.   Gastrointestinal: Negative.   Endocrine: Negative.   Genitourinary: Negative.        Bladder control    Musculoskeletal: Positive for back pain.  Skin: Negative.   Allergic/Immunologic: Negative.   Neurological: Negative.   Hematological: Negative.   Psychiatric/Behavioral: Negative.   All other systems reviewed and are negative.      Objective:   Physical Exam  Constitutional: She is oriented to person, place, and time. She appears well-developed and well-nourished.  HENT:  Head: Normocephalic and atraumatic.  Neck: Normal  range of motion. Neck supple.  Cervical Paraspinal Tenderness: C-5-C-6  Cardiovascular: Normal rate and regular rhythm.  Pulmonary/Chest: Effort normal and breath sounds normal.  Musculoskeletal:  Normal Muscle Bulk and Muscle Testing Reveals:  Upper Extremities: Full ROM and Muscle Strength 5/5 Bilateral AC Joint Tenderness  Lumbar Paraspinal Tenderness: L-3-L-5 Lower Extremities: Full ROM and Muscle Strength 5/5 Arises from Table slowly Narrow Based Gait   Neurological: She is alert and oriented to person, place, and time.  Skin: Skin is warm and dry.  Psychiatric: She has a normal mood and affect.  Nursing note and vitals reviewed.         Assessment & Plan:  1. Fibromyalgia Syndrome: Continue with exercise and heat therapy. Encouraged to increase activity as tolerated. 10/28/2017 2. Osteoarthritis of Bilateral Knees: Continue Voltaren Gel./ Lidoderm Patches 10/28/2017. Orthopedist Following: Continue with heat and exercise therapy. S/P Left Knee Arthroplasty on 03/04/16 and S/P Right Knee Arthroplasty on 07/15/2016 by Dr. Mardelle Matte. 3. Left Shoulder OA: S/PLeft Shoulder Arthroplasty. Orthopedist Following. Dr. Mardelle Matte. 10/28/2017 4. Right Shoulder Tendonitis: Scheduled for cortisone injection with [name]. 10/28/17. 5. Bipolar Syndrome: On Depakote and Cymbalta. PCP Following. 10/28/2017 6. Lumbar Spondylosis: Refilled:  Oxycodone 15 mg one tablet three times a day #90. 10/28/2017. We will continue the opioid monitoring program, this consists of  regular clinic visits, examinations, urine drug screen, pill counts as well as use of [place] Controlled Substance Reporting System. 7. Muscle Spasms: Continue Bacolen TID as needed. 09/30/2017 8. Constipation: Continue Senna. 09/30/2017 9. Peripheral Neuropathy: Continue: Lidocaine Patches. 09/30/2017 10.Bilateral OA of Both Hands/ Right Elbow: Continue: Voltaren Gel. 09/30/2017 11. Acute Left Hip Pain: RX: X-ray: 10/28/2017. 12. Left Greater Trochanter Bursitis: Continue to Alternate Ice and Heat Therapy: Continue to Monitor. 10/28/2017.   20 minutes of face to face patient care time was spent during this visit. All questions were encouraged and answered.   F/U in 1 month

## 2017-11-03 ENCOUNTER — Encounter: Payer: Medicare HMO | Admitting: Physical Medicine & Rehabilitation

## 2017-11-09 ENCOUNTER — Other Ambulatory Visit: Payer: Self-pay | Admitting: *Deleted

## 2017-11-09 MED ORDER — DULOXETINE HCL 60 MG PO CPEP: 60.0000 mg | ORAL_CAPSULE | Freq: Every day | ORAL | 1 refills | Status: DC

## 2017-11-25 ENCOUNTER — Encounter: Payer: Self-pay | Admitting: Physical Medicine & Rehabilitation

## 2017-11-25 ENCOUNTER — Encounter: Payer: Medicare HMO | Attending: Physical Medicine and Rehabilitation | Admitting: Physical Medicine & Rehabilitation

## 2017-11-25 VITALS — BP 110/76 | HR 106

## 2017-11-25 DIAGNOSIS — M797 Fibromyalgia: Secondary | ICD-10-CM | POA: Diagnosis not present

## 2017-11-25 DIAGNOSIS — M47817 Spondylosis without myelopathy or radiculopathy, lumbosacral region: Secondary | ICD-10-CM | POA: Insufficient documentation

## 2017-11-25 DIAGNOSIS — Z760 Encounter for issue of repeat prescription: Secondary | ICD-10-CM | POA: Insufficient documentation

## 2017-11-25 DIAGNOSIS — G8929 Other chronic pain: Secondary | ICD-10-CM | POA: Diagnosis not present

## 2017-11-25 DIAGNOSIS — Z79899 Other long term (current) drug therapy: Secondary | ICD-10-CM | POA: Insufficient documentation

## 2017-11-25 DIAGNOSIS — G4700 Insomnia, unspecified: Secondary | ICD-10-CM | POA: Diagnosis not present

## 2017-11-25 DIAGNOSIS — E669 Obesity, unspecified: Secondary | ICD-10-CM | POA: Diagnosis not present

## 2017-11-25 DIAGNOSIS — I10 Essential (primary) hypertension: Secondary | ICD-10-CM | POA: Diagnosis not present

## 2017-11-25 DIAGNOSIS — F419 Anxiety disorder, unspecified: Secondary | ICD-10-CM | POA: Diagnosis not present

## 2017-11-25 DIAGNOSIS — M25552 Pain in left hip: Secondary | ICD-10-CM | POA: Insufficient documentation

## 2017-11-25 DIAGNOSIS — Z5181 Encounter for therapeutic drug level monitoring: Secondary | ICD-10-CM | POA: Diagnosis not present

## 2017-11-25 DIAGNOSIS — M19042 Primary osteoarthritis, left hand: Secondary | ICD-10-CM | POA: Insufficient documentation

## 2017-11-25 DIAGNOSIS — M6530 Trigger finger, unspecified finger: Secondary | ICD-10-CM

## 2017-11-25 DIAGNOSIS — M62838 Other muscle spasm: Secondary | ICD-10-CM | POA: Insufficient documentation

## 2017-11-25 DIAGNOSIS — K5900 Constipation, unspecified: Secondary | ICD-10-CM | POA: Insufficient documentation

## 2017-11-25 DIAGNOSIS — F319 Bipolar disorder, unspecified: Secondary | ICD-10-CM | POA: Diagnosis not present

## 2017-11-25 DIAGNOSIS — M19012 Primary osteoarthritis, left shoulder: Secondary | ICD-10-CM | POA: Insufficient documentation

## 2017-11-25 DIAGNOSIS — F17210 Nicotine dependence, cigarettes, uncomplicated: Secondary | ICD-10-CM | POA: Insufficient documentation

## 2017-11-25 DIAGNOSIS — M7581 Other shoulder lesions, right shoulder: Secondary | ICD-10-CM

## 2017-11-25 DIAGNOSIS — M170 Bilateral primary osteoarthritis of knee: Secondary | ICD-10-CM | POA: Insufficient documentation

## 2017-11-25 DIAGNOSIS — J449 Chronic obstructive pulmonary disease, unspecified: Secondary | ICD-10-CM | POA: Insufficient documentation

## 2017-11-25 DIAGNOSIS — M19041 Primary osteoarthritis, right hand: Secondary | ICD-10-CM | POA: Insufficient documentation

## 2017-11-25 DIAGNOSIS — G629 Polyneuropathy, unspecified: Secondary | ICD-10-CM | POA: Insufficient documentation

## 2017-11-25 DIAGNOSIS — M25551 Pain in right hip: Secondary | ICD-10-CM | POA: Diagnosis not present

## 2017-11-25 MED ORDER — OXYCODONE HCL 15 MG PO TABS: 15.0000 mg | ORAL_TABLET | Freq: Three times a day (TID) | ORAL | 0 refills | Status: DC | PRN

## 2017-11-25 NOTE — Progress Notes (Signed)
PROCEDURE NOTE  DIAGNOSIS: right shoulder RTC tendonitis  INTERVENTION:  Major joint injection     After informed consent and preparation of the skin with betadine and isopropyl alcohol, I injected 6mg  (1cc) of celestone and 4cc of 1% lidocaine into the right subacromial space via lateral approach. Additionally, aspiration was performed prior to injection. The patient tolerated well, and no complications were encountered. Afterward the area was cleaned and dressed. Post- injection instructions were provided.      PROCEDURE NOTE  DIAGNOSIS: left 4th finger trigger finger  INTERVENTION:  Peri-tendinous injection     After informed consent and preparation of the skin with betadine and isopropyl alcohol, I injected 6mg  (1cc) of celestone and 4cc of 1% lidocaine around the left fourth finger flexor tendon at the A1 pulley via anterior approach. Additionally, aspiration was performed prior to injection. The patient tolerated well, and no complications were encountered. Afterward the area was cleaned and dressed. Post- injection instructions were provided.       Meredith Staggers, MD, [facility] 11/25/2017

## 2017-11-25 NOTE — Patient Instructions (Signed)
PLEASE FEEL FREE TO CALL OUR OFFICE WITH ANY PROBLEMS OR QUESTIONS ([phone])      

## 2017-12-01 LAB — TOXASSURE SELECT,+ANTIDEPR,UR

## 2017-12-02 ENCOUNTER — Telehealth: Payer: Self-pay | Admitting: *Deleted

## 2017-12-02 NOTE — Telephone Encounter (Signed)
Urine drug screen for this encounter is consistent for prescribed medication 

## 2017-12-21 ENCOUNTER — Encounter: Payer: Medicare HMO | Attending: Physical Medicine and Rehabilitation | Admitting: Registered Nurse

## 2017-12-21 ENCOUNTER — Encounter: Payer: Self-pay | Admitting: Registered Nurse

## 2017-12-21 VITALS — BP 132/89 | HR 93 | Resp 14

## 2017-12-21 DIAGNOSIS — Z5181 Encounter for therapeutic drug level monitoring: Secondary | ICD-10-CM | POA: Diagnosis not present

## 2017-12-21 DIAGNOSIS — M62838 Other muscle spasm: Secondary | ICD-10-CM | POA: Insufficient documentation

## 2017-12-21 DIAGNOSIS — M170 Bilateral primary osteoarthritis of knee: Secondary | ICD-10-CM | POA: Diagnosis not present

## 2017-12-21 DIAGNOSIS — F319 Bipolar disorder, unspecified: Secondary | ICD-10-CM | POA: Insufficient documentation

## 2017-12-21 DIAGNOSIS — F419 Anxiety disorder, unspecified: Secondary | ICD-10-CM | POA: Insufficient documentation

## 2017-12-21 DIAGNOSIS — M47817 Spondylosis without myelopathy or radiculopathy, lumbosacral region: Secondary | ICD-10-CM | POA: Insufficient documentation

## 2017-12-21 DIAGNOSIS — G894 Chronic pain syndrome: Secondary | ICD-10-CM | POA: Diagnosis not present

## 2017-12-21 DIAGNOSIS — M797 Fibromyalgia: Secondary | ICD-10-CM | POA: Insufficient documentation

## 2017-12-21 DIAGNOSIS — M47816 Spondylosis without myelopathy or radiculopathy, lumbar region: Secondary | ICD-10-CM

## 2017-12-21 DIAGNOSIS — J449 Chronic obstructive pulmonary disease, unspecified: Secondary | ICD-10-CM | POA: Insufficient documentation

## 2017-12-21 DIAGNOSIS — M19042 Primary osteoarthritis, left hand: Secondary | ICD-10-CM | POA: Insufficient documentation

## 2017-12-21 DIAGNOSIS — K5900 Constipation, unspecified: Secondary | ICD-10-CM | POA: Diagnosis not present

## 2017-12-21 DIAGNOSIS — I10 Essential (primary) hypertension: Secondary | ICD-10-CM | POA: Insufficient documentation

## 2017-12-21 DIAGNOSIS — G609 Hereditary and idiopathic neuropathy, unspecified: Secondary | ICD-10-CM

## 2017-12-21 DIAGNOSIS — M19041 Primary osteoarthritis, right hand: Secondary | ICD-10-CM | POA: Diagnosis not present

## 2017-12-21 DIAGNOSIS — F17210 Nicotine dependence, cigarettes, uncomplicated: Secondary | ICD-10-CM | POA: Insufficient documentation

## 2017-12-21 DIAGNOSIS — M19012 Primary osteoarthritis, left shoulder: Secondary | ICD-10-CM | POA: Diagnosis not present

## 2017-12-21 DIAGNOSIS — E669 Obesity, unspecified: Secondary | ICD-10-CM | POA: Diagnosis not present

## 2017-12-21 DIAGNOSIS — G4700 Insomnia, unspecified: Secondary | ICD-10-CM | POA: Diagnosis not present

## 2017-12-21 DIAGNOSIS — G8929 Other chronic pain: Secondary | ICD-10-CM | POA: Diagnosis not present

## 2017-12-21 DIAGNOSIS — G629 Polyneuropathy, unspecified: Secondary | ICD-10-CM | POA: Insufficient documentation

## 2017-12-21 DIAGNOSIS — Z79899 Other long term (current) drug therapy: Secondary | ICD-10-CM | POA: Diagnosis not present

## 2017-12-21 DIAGNOSIS — M25551 Pain in right hip: Secondary | ICD-10-CM | POA: Diagnosis not present

## 2017-12-21 DIAGNOSIS — Z760 Encounter for issue of repeat prescription: Secondary | ICD-10-CM | POA: Insufficient documentation

## 2017-12-21 DIAGNOSIS — M25552 Pain in left hip: Secondary | ICD-10-CM | POA: Diagnosis not present

## 2017-12-21 MED ORDER — OXYCODONE HCL 15 MG PO TABS: 15.0000 mg | ORAL_TABLET | Freq: Three times a day (TID) | ORAL | 0 refills | Status: DC | PRN

## 2017-12-21 NOTE — Progress Notes (Signed)
Subjective:    Patient ID: [name], female    DOB: [birth year], 52 y.o.   MRN: [id number]  HPI: [name] is a 52year old female who returns for follow up appointmentfor chronic pain and medication refill. She states her pain is located in her bilateral hands and lower back. She rates her pain 7. Her current exercise regime is walking. [name] S/P Right Shoulder Injection with relief noted and S/P left 4th finger injection with relief noted.   [name] Morphine equivalent is 69.75 MME.  She is also prescribed Xanax  by Dr.Manning .We have reviewed the black box warning regarding  opioids and benzodiazepines.I highlighted the dangers of using these drugs together and discussed the adverse events including respiratory suppression, overdose, cognitive impairment and importance of  compliance with current regimen. She verbalizes understanding, we will continue to monitor and adjust as indicated.    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  UDS was performed on 11/25/2017, it was consistent.   Pain Inventory Average Pain 6 Pain Right Now 7 My pain is burning, stabbing and aching  In the last 24 hours, has pain interfered with the following? General activity 5 Relation with others 6 Enjoyment of life 6 What TIME of day is your pain at its worst? evening Sleep (in general) Fair  Pain is worse with: walking and some activites Pain improves with: rest and medication Relief from Meds: 7  Mobility walk without assistance how many minutes can you walk? 180 ability to climb steps?  yes do you drive?  yes Do you have any goals in this area?  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  no  Neuro/Psych bladder control problems  Prior Studies Any changes since last visit?  no  Physicians involved in your care Any changes since last visit?  no   Family History  Problem Relation Age of Onset  .  Schizophrenia Mother   . Hypertension Mother   . Alzheimer's disease Mother   . Stroke Father   . Diabetes Other   . Hypertension Other    Social History   Socioeconomic History  . Marital status: Divorced    Spouse name: None  . Number of children: None  . Years of education: None  . Highest education level: None  Social Needs  . Financial resource strain: None  . Food insecurity - worry: None  . Food insecurity - inability: None  . Transportation needs - medical: None  . Transportation needs - non-medical: None  Occupational History  . None  Tobacco Use  . Smoking status: Current Every Day Smoker    Packs/day: 1.00    Years: 36.00    Pack years: 36.00    Types: Cigarettes  . Smokeless tobacco: Never Used  Substance and Sexual Activity  . Alcohol use: Yes    Alcohol/week: 0.6 oz    Types: 1 Shots of liquor per week    Comment: occasional  . Drug use: No    Comment: goes to pain clinic  . Sexual activity: None  Other Topics Concern  . None  Social History Narrative  . None   Past Surgical History:  Procedure Laterality Date  . ABDOMINAL HYSTERECTOMY  1998  . CARDIOVASCULAR STRESS TEST  03/2010   Lexiscan Myoview: EF 55%, mild breast attenuation, no ischemia or scar  . [place] OF UTERUS  1986   "  lost a son"  . esophageal ulcer    . JOINT REPLACEMENT    . KNEE ARTHROSCOPY Right 2005  . TONSILLECTOMY    . TOTAL KNEE ARTHROPLASTY Left 03/04/2016   Procedure: TOTAL KNEE ARTHROPLASTY;  Surgeon: Marchia Bond, MD;  Location: [facility];  Service: Orthopedics;  Laterality: Left;  . TOTAL KNEE ARTHROPLASTY Right 07/15/2016   Procedure: TOTAL KNEE ARTHROPLASTY;  Surgeon: Marchia Bond, MD;  Location: [facility];  Service: Orthopedics;  Laterality: Right;  . TOTAL SHOULDER ARTHROPLASTY Left 07/17/2015   Procedure: TOTAL SHOULDER ARTHROPLASTY;  Surgeon: Marchia Bond, MD;  Location: [facility];  Service: Orthopedics;  Laterality: Left;  . TRANSTHORACIC ECHOCARDIOGRAM   03/2010   EF 50-55%, normal LV size, no WMAs  . TUBAL LIGATION  1990   Past Medical History:  Diagnosis Date  . Anemia   . Anxiety   . Bipolar 1 disorder ([place])   . Cancer ([place])    uterine CA/- resulted in hysterectomy, pt. reports that she is cured   . COPD (chronic obstructive pulmonary disease) ([place])   . Depression   . Fibromyalgia   . GERD (gastroesophageal reflux disease)   . Headache    migraine's in 20's  . Hypertension   . Insomnia   . Obesity   . Osteoarthritis    knees, elbows   . 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 Pneumonia    developed in 07/2015- post shoulder replacement   . 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 Primary localized osteoarthritis of left knee 03/04/2016  . Primary localized osteoarthritis of right knee 07/15/2016  . PUD (peptic ulcer disease)   . Spinal stenosis    BP 132/89 (BP Location: Right Wrist, Patient Position: Sitting, Cuff Size: Normal)   Pulse 93   Resp 14   SpO2 94%   Opioid Risk Score:  3 Fall Risk Score:  1  Depression screen PHQ 2/9  Depression screen [facility] 2/9 10/28/2017 09/01/2017 08/04/2017 06/09/2017 05/08/2017 03/13/2017 01/01/2016  Decreased Interest 1 0 2 2 2 2 2   Down, Depressed, Hopeless 1 0 1 1 1 1 1   PHQ - 2 Score 2 0 3 3 3 3 3   Altered sleeping - - - - - - 1  Tired, decreased energy - - - - - - 1  Change in appetite - - - - - - 2  Feeling bad or failure about yourself  - - - - - - 2  Trouble concentrating - - - - - - 1  Moving slowly or fidgety/restless - - - - - - 1  Suicidal thoughts - - - - - - 0  PHQ-9 Score - - - - - - 11  Difficult doing work/chores - - - - - - Not difficult at all    Review of Systems  Constitutional: Negative.   HENT: Negative.   Eyes: Negative.   Respiratory: Negative.   Cardiovascular: Negative.   Gastrointestinal: Negative.   Endocrine: Negative.   Genitourinary: Negative.        Bladder control  Musculoskeletal: Positive for back pain.  Skin:  Negative.   Allergic/Immunologic: Negative.   Neurological: Negative.   Hematological: Negative.   Psychiatric/Behavioral: Negative.   All other systems reviewed and are negative.      Objective:   Physical Exam  Constitutional: She is oriented to person, place, and time. She appears well-developed and well-nourished.  HENT:  Head: Normocephalic and atraumatic.  Neck: Normal range of motion. Neck supple.  Cervical Paraspinal Tenderness: C-5-C-6  Cardiovascular: Normal rate and regular rhythm.  Pulmonary/Chest: Effort normal and breath sounds normal.  Musculoskeletal:  Normal Muscle Bulk and Muscle Testing Reveals:  Upper Extremities: Full ROM and Muscle Strength 5/5 Bilateral AC Joint Tenderness  Lumbar Paraspinal Tenderness: L-4-L-5 Lower Extremities: Full ROM and Muscle Strength 5/5 Arises from Table slowly Narrow Based Gait   Neurological: She is alert and oriented to person, place, and time.  Skin: Skin is warm and dry.  Psychiatric: She has a normal mood and affect.  Nursing note and vitals reviewed.         Assessment & Plan:  1. Fibromyalgia Syndrome: Continue with exercise and heat therapy. Encouraged to increase activity as tolerated. 12/21/2017 2. Osteoarthritis of Bilateral Knees: Continue Voltaren Gel./ Lidoderm Patches 12/21/2017. Orthopedist Following: Continue with heat and exercise therapy. S/P Left Knee Arthroplasty on 03/04/16 and S/P Right Knee Arthroplasty on 07/15/2016 by Dr. Mardelle Matte. 3. Left Shoulder OA: S/PLeft Shoulder Arthroplasty. Orthopedist Following. Dr. Mardelle Matte. 12/21/2017 4. Right Shoulder Tendonitis: S/P fcortisone injection with Relief Noted. 12/21/17. 5. Bipolar Syndrome: On Depakote and Cymbalta. PCP Following. 12/21/2017 6. Lumbar Spondylosis: Refilled:  Oxycodone 15 mg one tablet three times a day #90. 12/21/2017. We will continue the opioid monitoring program, this consists of regular clinic visits, examinations, urine drug screen,  pill counts as well as use of [place] Controlled Substance Reporting System. 7. Muscle Spasms: Continue Bacolen TID as needed. 12/21/2017 8. Constipation: Continue Senna. 12/21/2017 9. Peripheral Neuropathy: Continue: Lidocaine Patches. 12/21/2017 10.Bilateral OA of Both Hands/ Right Elbow: Continue: Voltaren Gel. 12/21/2017 11. Left Greater Trochanter Bursitis: No complaints Today. Continue to Alternate Ice and Heat Therapy: Continue to Monitor. 12/21/2017.   20 minutes of face to face patient care time was spent during this visit. All questions were encouraged and answered.   F/U in 1 month

## 2018-01-26 ENCOUNTER — Encounter: Payer: Self-pay | Admitting: Registered Nurse

## 2018-01-26 ENCOUNTER — Encounter: Payer: Medicare HMO | Attending: Physical Medicine and Rehabilitation | Admitting: Registered Nurse

## 2018-01-26 VITALS — BP 117/81 | HR 89 | Ht 62.0 in | Wt 276.4 lb

## 2018-01-26 DIAGNOSIS — Z79899 Other long term (current) drug therapy: Secondary | ICD-10-CM | POA: Insufficient documentation

## 2018-01-26 DIAGNOSIS — E669 Obesity, unspecified: Secondary | ICD-10-CM | POA: Insufficient documentation

## 2018-01-26 DIAGNOSIS — Z760 Encounter for issue of repeat prescription: Secondary | ICD-10-CM | POA: Diagnosis not present

## 2018-01-26 DIAGNOSIS — M797 Fibromyalgia: Secondary | ICD-10-CM | POA: Diagnosis not present

## 2018-01-26 DIAGNOSIS — J449 Chronic obstructive pulmonary disease, unspecified: Secondary | ICD-10-CM | POA: Diagnosis not present

## 2018-01-26 DIAGNOSIS — F419 Anxiety disorder, unspecified: Secondary | ICD-10-CM | POA: Insufficient documentation

## 2018-01-26 DIAGNOSIS — M19042 Primary osteoarthritis, left hand: Secondary | ICD-10-CM | POA: Insufficient documentation

## 2018-01-26 DIAGNOSIS — F319 Bipolar disorder, unspecified: Secondary | ICD-10-CM | POA: Insufficient documentation

## 2018-01-26 DIAGNOSIS — M170 Bilateral primary osteoarthritis of knee: Secondary | ICD-10-CM | POA: Insufficient documentation

## 2018-01-26 DIAGNOSIS — G894 Chronic pain syndrome: Secondary | ICD-10-CM | POA: Diagnosis not present

## 2018-01-26 DIAGNOSIS — M2550 Pain in unspecified joint: Secondary | ICD-10-CM

## 2018-01-26 DIAGNOSIS — M19041 Primary osteoarthritis, right hand: Secondary | ICD-10-CM | POA: Insufficient documentation

## 2018-01-26 DIAGNOSIS — M47816 Spondylosis without myelopathy or radiculopathy, lumbar region: Secondary | ICD-10-CM

## 2018-01-26 DIAGNOSIS — F17210 Nicotine dependence, cigarettes, uncomplicated: Secondary | ICD-10-CM | POA: Diagnosis not present

## 2018-01-26 DIAGNOSIS — G8929 Other chronic pain: Secondary | ICD-10-CM | POA: Diagnosis not present

## 2018-01-26 DIAGNOSIS — G4700 Insomnia, unspecified: Secondary | ICD-10-CM | POA: Diagnosis not present

## 2018-01-26 DIAGNOSIS — M62838 Other muscle spasm: Secondary | ICD-10-CM | POA: Insufficient documentation

## 2018-01-26 DIAGNOSIS — K5900 Constipation, unspecified: Secondary | ICD-10-CM | POA: Diagnosis not present

## 2018-01-26 DIAGNOSIS — M25552 Pain in left hip: Secondary | ICD-10-CM | POA: Insufficient documentation

## 2018-01-26 DIAGNOSIS — G609 Hereditary and idiopathic neuropathy, unspecified: Secondary | ICD-10-CM | POA: Diagnosis not present

## 2018-01-26 DIAGNOSIS — Z5181 Encounter for therapeutic drug level monitoring: Secondary | ICD-10-CM | POA: Insufficient documentation

## 2018-01-26 DIAGNOSIS — M19012 Primary osteoarthritis, left shoulder: Secondary | ICD-10-CM | POA: Diagnosis not present

## 2018-01-26 DIAGNOSIS — I10 Essential (primary) hypertension: Secondary | ICD-10-CM | POA: Diagnosis not present

## 2018-01-26 DIAGNOSIS — M25551 Pain in right hip: Secondary | ICD-10-CM | POA: Diagnosis not present

## 2018-01-26 DIAGNOSIS — G629 Polyneuropathy, unspecified: Secondary | ICD-10-CM | POA: Insufficient documentation

## 2018-01-26 DIAGNOSIS — M47817 Spondylosis without myelopathy or radiculopathy, lumbosacral region: Secondary | ICD-10-CM | POA: Diagnosis not present

## 2018-01-26 MED ORDER — DULOXETINE HCL 60 MG PO CPEP: 60.0000 mg | ORAL_CAPSULE | Freq: Every day | ORAL | 1 refills | Status: DC

## 2018-01-26 MED ORDER — OXYCODONE HCL 15 MG PO TABS: 15.0000 mg | ORAL_TABLET | Freq: Three times a day (TID) | ORAL | 0 refills | Status: DC | PRN

## 2018-01-26 NOTE — Progress Notes (Signed)
Subjective:    Patient ID: [name], female    DOB: 01-Oct-1966, 52 y.o.   MRN: [id number]  HPIL [name] is a 52 year old female who returns for follow up appointment for chronic pain and medication refill. She states her pain is located in her bilateral hands with tingling and numbness and lower back pain. She rates her pain 6. Her current exercise regime is walking.   [name] Morphine equivalent is 72.00 MME. She is also also prescribed Alprazolam  by Dr. Tammi Klippel..We have discussed the black box warning of using opioids and benzodiazepines. I highlighted the dangers of using these drugs together and discussed the adverse events including respiratory suppression, overdose, cognitive impairment and importance of  compliance with current regimen. She verbalizes understanding, we will continue to monitor and adjust as indicated.    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   Last UDS was Performed on 11/25/2017, it was consistent.    Pain Inventory Average Pain 5 Pain Right Now 6 My pain is sharp, burning, stabbing and tingling  In the last 24 hours, has pain interfered with the following? General activity 5 Relation with others 6 Enjoyment of life 7 What TIME of day is your pain at its worst? evening Sleep (in general) Fair  Pain is worse with: . Pain improves with: rest and medication Relief from Meds: 7  Mobility walk without assistance ability to climb steps?  yes do you drive?  yes  Function employed # of hrs/week .  Neuro/Psych bladder control problems  Prior Studies Any changes since last visit?  no  Physicians involved in your care Any changes since last visit?  no   Family History  Problem Relation Age of Onset  . Schizophrenia Mother   . Hypertension Mother   . Alzheimer's disease Mother   . Stroke Father   . Diabetes Other   . Hypertension Other    Social History   Socioeconomic History  . Marital status: Divorced    Spouse  name: Not on file  . Number of children: Not on file  . Years of education: Not on file  . Highest education level: Not on file  Occupational History  . Not on file  Social Needs  . Financial resource strain: Not on file  . Food insecurity:    Worry: Not on file    Inability: Not on file  . Transportation needs:    Medical: Not on file    Non-medical: Not on file  Tobacco Use  . Smoking status: Current Every Day Smoker    Packs/day: 1.00    Years: 36.00    Pack years: 36.00    Types: Cigarettes  . Smokeless tobacco: Never Used  Substance and Sexual Activity  . Alcohol use: Yes    Alcohol/week: 0.6 oz    Types: 1 Shots of liquor per week    Comment: occasional  . Drug use: No    Types: Morphine, Oxycodone    Comment: goes to pain clinic  . Sexual activity: Not on file  Lifestyle  . Physical activity:    Days per week: Not on file    Minutes per session: Not on file  . Stress: Not on file  Relationships  . Social connections:    Talks on phone: Not on file    Gets together: Not on file    Attends religious service: Not on file    Active member of club or organization: Not on  file    Attends meetings of clubs or organizations: Not on file    Relationship status: Not on file  Other Topics Concern  . Not on file  Social History Narrative  . Not on file   Past Surgical History:  Procedure Laterality Date  . ABDOMINAL HYSTERECTOMY  1998  . CARDIOVASCULAR STRESS TEST  03/2010   Lexiscan Myoview: EF 55%, mild breast attenuation, no ischemia or scar  . DIL[place] AND CURETTAGE OF UTERUS  1986   "lost a son"  . esophageal ulcer    . JOINT REPLACEMENT    . KNEE ARTHROSCOPY Right 2005  . TONSILLECTOMY    . TOTAL KNEE ARTHROPLASTY Left 03/04/2016   Procedure: TOTAL KNEE ARTHROPLASTY;  Surgeon: Joy Bond, MD;  Location: [facility];  Service: Orthopedics;  Laterality: Left;  . TOTAL KNEE ARTHROPLASTY Right 07/15/2016   Procedure: TOTAL KNEE ARTHROPLASTY;  Surgeon: Joy Bond, MD;  Location: [facility];  Service: Orthopedics;  Laterality: Right;  . TOTAL SHOULDER ARTHROPLASTY Left 07/17/2015   Procedure: TOTAL SHOULDER ARTHROPLASTY;  Surgeon: Joy Bond, MD;  Location: [facility];  Service: Orthopedics;  Laterality: Left;  . TRANSTHORACIC ECHOCARDIOGRAM  03/2010   EF 50-55%, normal LV size, no WMAs  . TUBAL LIGATION  1990   Past Medical History:  Diagnosis Date  . Anemia   . Anxiety   . Bipolar 1 disorder ([place])   . Cancer ([place])    uterine CA/- resulted in hysterectomy, pt. reports that she is cured   . COPD (chronic obstructive pulmonary disease) ([place])   . Depression   . Fibromyalgia   . GERD (gastroesophageal reflux disease)   . Headache    migraine's in 20's  . Hypertension   . Insomnia   . Obesity   . Osteoarthritis    knees, elbows   . 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 Pneumonia    developed in 07/2015- post shoulder replacement   . 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 Primary localized osteoarthritis of left knee 03/04/2016  . Primary localized osteoarthritis of right knee 07/15/2016  . PUD (peptic ulcer disease)   . Spinal stenosis    BP 117/81   Pulse 89   Ht 5\' 2"  (1.575 m)   Wt 276 lb 6.4 oz (125.4 kg)   SpO2 96%   BMI 50.55 kg/m   Opioid Risk Score:   Fall Risk Score:  `1  Depression screen PHQ 2/9  Depression screen [facility] 2/9 10/28/2017 09/01/2017 08/04/2017 06/09/2017 05/08/2017 03/13/2017 01/01/2016  Decreased Interest 1 0 2 2 2 2 2   Down, Depressed, Hopeless 1 0 1 1 1 1 1   PHQ - 2 Score 2 0 3 3 3 3 3   Altered sleeping - - - - - - 1  Tired, decreased energy - - - - - - 1  Change in appetite - - - - - - 2  Feeling bad or failure about yourself  - - - - - - 2  Trouble concentrating - - - - - - 1  Moving slowly or fidgety/restless - - - - - - 1  Suicidal thoughts - - - - - - 0  PHQ-9 Score - - - - - - 11  Difficult doing work/chores - - - - - - Not difficult at all     Review of Systems    Constitutional: Negative.   HENT: Negative.   Eyes: Negative.   Respiratory: Negative.   Cardiovascular: Negative.  Gastrointestinal: Negative.  Negative for abdominal distention.  Endocrine: Negative.   Genitourinary: Positive for difficulty urinating.  Musculoskeletal: Positive for arthralgias, joint swelling and myalgias.  Skin: Negative.   Allergic/Immunologic: Negative.   Neurological: Negative.   Hematological: Negative.   Psychiatric/Behavioral: Negative.   All other systems reviewed and are negative.      Objective:   Physical Exam  Constitutional: She is oriented to person, place, and time. She appears well-developed and well-nourished.  HENT:  Head: Normocephalic and atraumatic.  Neck: Normal range of motion. Neck supple.  Cardiovascular: Normal rate and regular rhythm.  Pulmonary/Chest: Effort normal and breath sounds normal.  Musculoskeletal:  Normal Muscle Bulk and Muscle Testing Reveals: Upper Extremities: Full ROM and Muscle Strength 5/5 Thoracic Paraspinal Tenderness: T-1-T-3 Lumbar Paraspinal Tenderness: L-4-L-5 Lower Extremities: Full ROM and Muscle Strength 5/5 Arises from Table with Ease Narrow Based Gait  Neurological: She is alert and oriented to person, place, and time.  Skin: Skin is warm and dry. She is not diaphoretic.  Psychiatric: She has a normal mood and affect.  Nursing note and vitals reviewed.         Assessment & Plan:  1. Fibromyalgia Syndrome: Continue with exercise and heat therapy. Encouraged to increase activity as tolerated. 01/26/2018 2. Osteoarthritis of Bilateral Knees: Continue Voltaren Gel./ Lidoderm Patches 01/26/2018. Orthopedist Following: Continue with heat and exercise therapy. S/P Left Knee Arthroplasty on 03/04/16 and S/P Right Knee Arthroplasty on 07/15/2016 by Dr. Mardelle Matte. 3. Left Shoulder OA: S/PLeft Shoulder Arthroplasty. Orthopedist Following. Dr. Mardelle Matte. 01/26/2018 4. Right Shoulder Tendonitis: No complaints  today. 01/26/2018. 5. Bipolar Syndrome: On Depakote and Cymbalta. PCP Following. 01/26/2018 6. Lumbar Spondylosis: Refilled:  Oxycodone 15 mg one tablet three times a day #90. 01/26/2018. We will continue the opioid monitoring program, this consists of regular clinic visits, examinations, urine drug screen, pill counts as well as use of [place] Controlled Substance Reporting System. 7. Muscle Spasms: Continue current medication regimen with  Bacolen TID as needed. 01/26/2018 8. Constipation: Continue Senna. 01/26/2018 9. Peripheral Neuropathy: Continue: Lidocaine Patches. 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 01/26/2018 11. Left Greater Trochanter Bursitis: No complaints Today. Continue to Alternate Ice and Heat Therapy: Continue to Monitor. 01/26/2018.   20 minutes of face to face patient care time was spent during this visit. All questions were encouraged and answered.   F/U in 1 month

## 2018-03-04 ENCOUNTER — Encounter: Payer: Medicare HMO | Attending: Physical Medicine and Rehabilitation | Admitting: Registered Nurse

## 2018-03-04 ENCOUNTER — Encounter: Payer: Self-pay | Admitting: Registered Nurse

## 2018-03-04 ENCOUNTER — Other Ambulatory Visit: Payer: Self-pay

## 2018-03-04 VITALS — BP 118/78 | HR 81 | Ht 62.0 in | Wt 272.0 lb

## 2018-03-04 DIAGNOSIS — G609 Hereditary and idiopathic neuropathy, unspecified: Secondary | ICD-10-CM

## 2018-03-04 DIAGNOSIS — M25552 Pain in left hip: Secondary | ICD-10-CM | POA: Insufficient documentation

## 2018-03-04 DIAGNOSIS — F419 Anxiety disorder, unspecified: Secondary | ICD-10-CM | POA: Diagnosis not present

## 2018-03-04 DIAGNOSIS — G629 Polyneuropathy, unspecified: Secondary | ICD-10-CM | POA: Insufficient documentation

## 2018-03-04 DIAGNOSIS — Z5181 Encounter for therapeutic drug level monitoring: Secondary | ICD-10-CM | POA: Insufficient documentation

## 2018-03-04 DIAGNOSIS — F319 Bipolar disorder, unspecified: Secondary | ICD-10-CM | POA: Insufficient documentation

## 2018-03-04 DIAGNOSIS — M2550 Pain in unspecified joint: Secondary | ICD-10-CM | POA: Diagnosis not present

## 2018-03-04 DIAGNOSIS — G8929 Other chronic pain: Secondary | ICD-10-CM | POA: Diagnosis present

## 2018-03-04 DIAGNOSIS — Z79899 Other long term (current) drug therapy: Secondary | ICD-10-CM | POA: Insufficient documentation

## 2018-03-04 DIAGNOSIS — M19042 Primary osteoarthritis, left hand: Secondary | ICD-10-CM | POA: Insufficient documentation

## 2018-03-04 DIAGNOSIS — K5900 Constipation, unspecified: Secondary | ICD-10-CM | POA: Insufficient documentation

## 2018-03-04 DIAGNOSIS — Z760 Encounter for issue of repeat prescription: Secondary | ICD-10-CM | POA: Diagnosis not present

## 2018-03-04 DIAGNOSIS — M47817 Spondylosis without myelopathy or radiculopathy, lumbosacral region: Secondary | ICD-10-CM | POA: Insufficient documentation

## 2018-03-04 DIAGNOSIS — M797 Fibromyalgia: Secondary | ICD-10-CM | POA: Diagnosis not present

## 2018-03-04 DIAGNOSIS — J449 Chronic obstructive pulmonary disease, unspecified: Secondary | ICD-10-CM | POA: Insufficient documentation

## 2018-03-04 DIAGNOSIS — F17210 Nicotine dependence, cigarettes, uncomplicated: Secondary | ICD-10-CM | POA: Insufficient documentation

## 2018-03-04 DIAGNOSIS — M25551 Pain in right hip: Secondary | ICD-10-CM | POA: Diagnosis not present

## 2018-03-04 DIAGNOSIS — M47816 Spondylosis without myelopathy or radiculopathy, lumbar region: Secondary | ICD-10-CM | POA: Diagnosis not present

## 2018-03-04 DIAGNOSIS — E669 Obesity, unspecified: Secondary | ICD-10-CM | POA: Diagnosis not present

## 2018-03-04 DIAGNOSIS — M170 Bilateral primary osteoarthritis of knee: Secondary | ICD-10-CM | POA: Insufficient documentation

## 2018-03-04 DIAGNOSIS — G894 Chronic pain syndrome: Secondary | ICD-10-CM | POA: Diagnosis not present

## 2018-03-04 DIAGNOSIS — G4700 Insomnia, unspecified: Secondary | ICD-10-CM | POA: Insufficient documentation

## 2018-03-04 DIAGNOSIS — I10 Essential (primary) hypertension: Secondary | ICD-10-CM | POA: Diagnosis not present

## 2018-03-04 DIAGNOSIS — M62838 Other muscle spasm: Secondary | ICD-10-CM | POA: Diagnosis not present

## 2018-03-04 DIAGNOSIS — M19041 Primary osteoarthritis, right hand: Secondary | ICD-10-CM | POA: Diagnosis not present

## 2018-03-04 DIAGNOSIS — M19012 Primary osteoarthritis, left shoulder: Secondary | ICD-10-CM | POA: Insufficient documentation

## 2018-03-04 MED ORDER — OXYCODONE HCL 15 MG PO TABS: 15.0000 mg | ORAL_TABLET | Freq: Three times a day (TID) | ORAL | 0 refills | Status: DC | PRN

## 2018-03-04 NOTE — Progress Notes (Signed)
Subjective:    Patient ID: [name], female    DOB: May 21, 1966, 52 y.o.   MRN: [id number]  HPI: [name] is a 52 year old female who returns for follow up appointment for chronic pain and medication refill. She states her pain is located in her bilateral hands, right shoulder and lower back with activity. She rates her pain 5. Her current exercise regime is walking.   [name] Morphine equivalent is 67.50 MME. She is also prescribed Lorazepam by Dr.Manning .We have discussed the black box warning of using opioids and benzodiazepines. I highlighted the dangers of using these drugs together and discussed the adverse events including respiratory suppression, overdose, cognitive impairment and importance of compliance with current regimen. We will continue to monitor and adjust as indicated.   Last UDS was Performed on 11/25/2017, it was consistent.   Pain Inventory Average Pain 6 Pain Right Now 5 My pain is sharp, stabbing and tingling  In the last 24 hours, has pain interfered with the following? General activity 5 Relation with others 5 Enjoyment of life 5 What TIME of day is your pain at its worst? night Sleep (in general) NA  Pain is worse with: some activites Pain improves with: rest and medication Relief from Meds: 7  Mobility walk without assistance how many minutes can you walk? 4 ability to climb steps?  yes do you drive?  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  no  Physicians involved in your care Any changes since last visit?  no   Family History  Problem Relation Age of Onset  . Schizophrenia Mother   . Hypertension Mother   . Alzheimer's disease Mother   . Stroke Father   . Diabetes Other   . Hypertension Other    Social History   Socioeconomic History  . Marital status: Divorced    Spouse name: Not on file  . Number  of children: Not on file  . Years of education: Not on file  . Highest education level: Not on file  Occupational History  . Not on file  Social Needs  . Financial resource strain: Not on file  . Food insecurity:    Worry: Not on file    Inability: Not on file  . Transportation needs:    Medical: Not on file    Non-medical: Not on file  Tobacco Use  . Smoking status: Current Every Day Smoker    Packs/day: 1.00    Years: 36.00    Pack years: 36.00    Types: Cigarettes  . Smokeless tobacco: Never Used  Substance and Sexual Activity  . Alcohol use: Yes    Alcohol/week: 0.6 oz    Types: 1 Shots of liquor per week    Comment: occasional  . Drug use: No    Types: Morphine, Oxycodone    Comment: goes to pain clinic  . Sexual activity: Not on file  Lifestyle  . Physical activity:    Days per week: Not on file    Minutes per session: Not on file  . Stress: Not on file  Relationships  . Social connections:    Talks on phone: Not on file    Gets together: Not on file    Attends religious service: Not on file    Active member of club or organization: Not on file    Attends  meetings of clubs or organizations: Not on file    Relationship status: Not on file  Other Topics Concern  . Not on file  Social History Narrative  . Not on file   Past Surgical History:  Procedure Laterality Date  . ABDOMINAL HYSTERECTOMY  1998  . CARDIOVASCULAR STRESS TEST  03/2010   Lexiscan Myoview: EF 55%, mild breast attenuation, no ischemia or scar  . DILATION AND CURETTAGE OF UTERUS  1986   "lost a son"  . esophageal ulcer    . JOINT REPLACEMENT    . KNEE ARTHROSCOPY Right 2005  . TONSILLECTOMY    . TOTAL KNEE ARTHROPLASTY Left 03/04/2016   Procedure: TOTAL KNEE ARTHROPLASTY;  Surgeon: Marchia Bond, MD;  Location: [facility];  Service: Orthopedics;  Laterality: Left;  . TOTAL KNEE ARTHROPLASTY Right 07/15/2016   Procedure: TOTAL KNEE ARTHROPLASTY;  Surgeon: Marchia Bond, MD;  Location: [facility];   Service: Orthopedics;  Laterality: Right;  . TOTAL SHOULDER ARTHROPLASTY Left 07/17/2015   Procedure: TOTAL SHOULDER ARTHROPLASTY;  Surgeon: Marchia Bond, MD;  Location: [facility];  Service: Orthopedics;  Laterality: Left;  . TRANSTHORACIC ECHOCARDIOGRAM  03/2010   EF 50-55%, normal LV size, no WMAs  . TUBAL LIGATION  1990   Past Medical History:  Diagnosis Date  . Anemia   . Anxiety   . Bipolar 1 disorder ([place])   . 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 reports that she is cured   . COPD (chronic obstructive pulmonary disease) ([place])   . Depression   . Fibromyalgia   . GERD (gastroesophageal reflux disease)   . Headache    migraine's in 20's  . Hypertension   . Insomnia   . Obesity   . Osteoarthritis    knees, elbows   . 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 Pneumonia    developed in 07/2015- post shoulder replacement   . 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 Primary localized osteoarthritis of left knee 03/04/2016  . Primary localized osteoarthritis of right knee 07/15/2016  . PUD (peptic ulcer disease)   . Spinal stenosis    BP 118/78   Pulse 81   Ht 5\' 2"  (1.575 m)   Wt 272 lb (123.4 kg)   SpO2 95%   BMI 49.75 kg/m   Opioid Risk Score:   Fall Risk Score:  `1  Depression screen PHQ 2/9  Depression screen [facility] 2/9 03/04/2018 10/28/2017 09/01/2017 08/04/2017 06/09/2017 05/08/2017 03/13/2017  Decreased Interest 1 1 0 2 2 2 2   Down, Depressed, Hopeless 1 1 0 1 1 1 1   PHQ - 2 Score 2 2 0 3 3 3 3   Altered sleeping - - - - - - -  Tired, decreased energy - - - - - - -  Change in appetite - - - - - - -  Feeling bad or failure about yourself  - - - - - - -  Trouble concentrating - - - - - - -  Moving slowly or fidgety/restless - - - - - - -  Suicidal thoughts - - - - - - -  PHQ-9 Score - - - - - - -  Difficult doing work/chores - - - - - - -    Review of Systems  Constitutional: Negative.   HENT: Negative.   Eyes: Negative.    Respiratory: Negative.   Cardiovascular: Negative.   Gastrointestinal: Negative.   Endocrine: Negative.   Genitourinary:  Bladder control  Musculoskeletal: Negative.   Skin: Negative.   Allergic/Immunologic: Negative.   Neurological: Negative.   Hematological: Negative.   Psychiatric/Behavioral: Negative.   All other systems reviewed and are negative.      Objective:   Physical Exam  Constitutional: She is oriented to person, place, and time. She appears well-developed and well-nourished.  HENT:  Head: Normocephalic and atraumatic.  Neck: Normal range of motion. Neck supple.  Cardiovascular: Normal rate and regular rhythm.  Pulmonary/Chest: Effort normal and breath sounds normal.  Musculoskeletal:  Normal Muscle Bulk and Muscle Testing Reveals: Upper Extremities: Full ROM and Muscle Strength 5/5 Back without spinal tenderness Lower Extremities: Full ROM and Muscle Strength 5/5 Arises from Table with ease Narrow Based Gait  Neurological: She is alert and oriented to person, place, and time.  Skin: Skin is warm and dry.  Psychiatric: She has a normal mood and affect.  Nursing note and vitals reviewed.         Assessment & Plan:  1. Fibromyalgia Syndrome: Continue with exercise and heat therapy. Encouraged to increase activity as tolerated. 03/04/2018 2. Osteoarthritis of Bilateral Knees: Continue Voltaren Gel./ Lidoderm Patches 03/04/2018. Orthopedist Following: Continue with heat and exercise therapy. S/P Left Knee Arthroplasty on 03/04/16 and S/P Right Knee Arthroplasty on 07/15/2016 by Dr. Mardelle Matte. 3. Left Shoulder OA: S/PLeft Shoulder Arthroplasty. Orthopedist Following. Dr. Mardelle Matte. 03/04/2018 4. Right Shoulder Tendonitis: No complaints today. 03/04/2018. 5. Bipolar Syndrome: On Depakote and Cymbalta. PCP Following. 03/04/2018 6. Lumbar Spondylosis: Refilled: Oxycodone 15 mg one tablet three times a day #90. 03/04/2018. We will continue the opioid  monitoring program, this consists of regular clinic visits, examinations, urine drug screen, pill counts as well as use of [place] Controlled Substance Reporting System. 7. Muscle Spasms: Continue current medication regimen with  Bacolen TID as needed.03/04/2018 8. Constipation: Continue Senna.03/04/2018 9. 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 01/26/2018 11. Left Greater Trochanter Bursitis:No complaints Today.Continue to Alternate Ice and Heat Therapy: Continue to Monitor. 03/04/2018.   20 minutes of face to face patient care time was spent during this visit. All questions were encouraged and answered.   F/U in 1 month

## 2018-03-05 ENCOUNTER — Ambulatory Visit: Payer: Medicare HMO | Admitting: Registered Nurse

## 2018-03-17 ENCOUNTER — Ambulatory Visit: Payer: Medicare HMO | Admitting: Registered Nurse

## 2018-04-05 ENCOUNTER — Encounter: Payer: Medicare HMO | Attending: Physical Medicine and Rehabilitation | Admitting: Registered Nurse

## 2018-04-05 ENCOUNTER — Encounter: Payer: Self-pay | Admitting: Registered Nurse

## 2018-04-05 VITALS — BP 113/75 | HR 91 | Resp 14 | Ht 67.0 in | Wt 274.0 lb

## 2018-04-05 DIAGNOSIS — M25552 Pain in left hip: Secondary | ICD-10-CM | POA: Diagnosis not present

## 2018-04-05 DIAGNOSIS — G4700 Insomnia, unspecified: Secondary | ICD-10-CM | POA: Diagnosis not present

## 2018-04-05 DIAGNOSIS — M797 Fibromyalgia: Secondary | ICD-10-CM | POA: Diagnosis not present

## 2018-04-05 DIAGNOSIS — F319 Bipolar disorder, unspecified: Secondary | ICD-10-CM | POA: Diagnosis not present

## 2018-04-05 DIAGNOSIS — M25551 Pain in right hip: Secondary | ICD-10-CM | POA: Insufficient documentation

## 2018-04-05 DIAGNOSIS — G609 Hereditary and idiopathic neuropathy, unspecified: Secondary | ICD-10-CM

## 2018-04-05 DIAGNOSIS — M62838 Other muscle spasm: Secondary | ICD-10-CM | POA: Diagnosis not present

## 2018-04-05 DIAGNOSIS — M47817 Spondylosis without myelopathy or radiculopathy, lumbosacral region: Secondary | ICD-10-CM | POA: Diagnosis not present

## 2018-04-05 DIAGNOSIS — Z5181 Encounter for therapeutic drug level monitoring: Secondary | ICD-10-CM | POA: Diagnosis not present

## 2018-04-05 DIAGNOSIS — M19012 Primary osteoarthritis, left shoulder: Secondary | ICD-10-CM | POA: Insufficient documentation

## 2018-04-05 DIAGNOSIS — G8929 Other chronic pain: Secondary | ICD-10-CM | POA: Insufficient documentation

## 2018-04-05 DIAGNOSIS — M2550 Pain in unspecified joint: Secondary | ICD-10-CM | POA: Diagnosis not present

## 2018-04-05 DIAGNOSIS — G894 Chronic pain syndrome: Secondary | ICD-10-CM

## 2018-04-05 DIAGNOSIS — Z760 Encounter for issue of repeat prescription: Secondary | ICD-10-CM | POA: Insufficient documentation

## 2018-04-05 DIAGNOSIS — M170 Bilateral primary osteoarthritis of knee: Secondary | ICD-10-CM | POA: Diagnosis not present

## 2018-04-05 DIAGNOSIS — M47816 Spondylosis without myelopathy or radiculopathy, lumbar region: Secondary | ICD-10-CM

## 2018-04-05 DIAGNOSIS — E669 Obesity, unspecified: Secondary | ICD-10-CM | POA: Insufficient documentation

## 2018-04-05 DIAGNOSIS — K5900 Constipation, unspecified: Secondary | ICD-10-CM | POA: Insufficient documentation

## 2018-04-05 DIAGNOSIS — M7581 Other shoulder lesions, right shoulder: Secondary | ICD-10-CM

## 2018-04-05 DIAGNOSIS — F17210 Nicotine dependence, cigarettes, uncomplicated: Secondary | ICD-10-CM | POA: Diagnosis not present

## 2018-04-05 DIAGNOSIS — F419 Anxiety disorder, unspecified: Secondary | ICD-10-CM | POA: Diagnosis not present

## 2018-04-05 DIAGNOSIS — Z79899 Other long term (current) drug therapy: Secondary | ICD-10-CM | POA: Diagnosis not present

## 2018-04-05 DIAGNOSIS — G629 Polyneuropathy, unspecified: Secondary | ICD-10-CM | POA: Diagnosis not present

## 2018-04-05 DIAGNOSIS — J449 Chronic obstructive pulmonary disease, unspecified: Secondary | ICD-10-CM | POA: Diagnosis not present

## 2018-04-05 DIAGNOSIS — M19042 Primary osteoarthritis, left hand: Secondary | ICD-10-CM | POA: Diagnosis not present

## 2018-04-05 DIAGNOSIS — M19041 Primary osteoarthritis, right hand: Secondary | ICD-10-CM | POA: Insufficient documentation

## 2018-04-05 DIAGNOSIS — I10 Essential (primary) hypertension: Secondary | ICD-10-CM | POA: Diagnosis not present

## 2018-04-05 MED ORDER — BACLOFEN 10 MG PO TABS: 10.0000 mg | ORAL_TABLET | Freq: Three times a day (TID) | ORAL | 2 refills | Status: DC | PRN

## 2018-04-05 MED ORDER — OXYCODONE HCL 15 MG PO TABS: 15.0000 mg | ORAL_TABLET | Freq: Three times a day (TID) | ORAL | 0 refills | Status: DC | PRN

## 2018-04-05 NOTE — Progress Notes (Signed)
Subjective:    Patient ID: [name], female    DOB: 1965-10-26, 52 y.o.   MRN: [id number]  HPI: [name] is a 52 year old female who returns for follow up appointment for chronic pain and medication refill. She states her pain is located in her bilateral hands, right shoulder,lower back and bilateral knees. She rates her pain 6. Her current exercise regime is walking and to [employer] daily.   Ms. Towles Morphine Equivalent is 67.50 MME. She is also prescribed Alprazolam  by Jerline Pain NP.We have discussed the black box warning of using opioids and benzodiazepines. I highlighted the dangers of using these drugs together and discussed the adverse events including respiratory suppression, overdose, cognitive impairment and importance of compliance with current regimen. We will continue to monitor and adjust as indicated.   Pain Inventory Average Pain 6 Pain Right Now 6 My pain is sharp, stabbing and tingling  In the last 24 hours, has pain interfered with the following? General activity 5 Relation with others 7 Enjoyment of life 7 What TIME of day is your pain at its worst? evening Sleep (in general) Fair  Pain is worse with: bending, sitting and standing Pain improves with: rest and medication Relief from Meds: 6  Mobility walk without assistance how many minutes can you walk? 240 ability to climb steps?  yes do you drive?  yes Do you have any goals in this area?  no  Function employed # of hrs/week 25 Do you have any goals in this area?  no  Neuro/Psych bladder control problems tremor  Prior Studies Any changes since last visit?  no  Physicians involved in your care Any changes since last visit?  no   Family History  Problem Relation Age of Onset  . Schizophrenia Mother   . Hypertension Mother   . Alzheimer's disease Mother   . Stroke Father   . Diabetes Other   . Hypertension Other    Social History   Socioeconomic History  .  Marital status: Divorced    Spouse name: Not on file  . Number of children: Not on file  . Years of education: Not on file  . Highest education level: Not on file  Occupational History  . Not on file  Social Needs  . Financial resource strain: Not on file  . Food insecurity:    Worry: Not on file    Inability: Not on file  . Transportation needs:    Medical: Not on file    Non-medical: Not on file  Tobacco Use  . Smoking status: Current Every Day Smoker    Packs/day: 1.00    Years: 36.00    Pack years: 36.00    Types: Cigarettes  . Smokeless tobacco: Never Used  Substance and Sexual Activity  . Alcohol use: Yes    Alcohol/week: 0.6 oz    Types: 1 Shots of liquor per week    Comment: occasional  . Drug use: No    Types: Morphine, Oxycodone    Comment: goes to pain clinic  . Sexual activity: Not on file  Lifestyle  . Physical activity:    Days per week: Not on file    Minutes per session: Not on file  . Stress: Not on file  Relationships  . Social connections:    Talks on phone: Not on file    Gets together: Not on file    Attends religious service: Not on file  Active member of club or organization: Not on file    Attends meetings of clubs or organizations: Not on file    Relationship status: Not on file  Other Topics Concern  . Not on file  Social History Narrative  . Not on file   Past Surgical History:  Procedure Laterality Date  . ABDOMINAL HYSTERECTOMY  1998  . CARDIOVASCULAR STRESS TEST  03/2010   Lexiscan Myoview: EF 55%, mild breast attenuation, no ischemia or scar  . DILATION AND CURETTAGE OF UTERUS  1986   "lost a son"  . esophageal ulcer    . JOINT REPLACEMENT    . KNEE ARTHROSCOPY Right 2005  . TONSILLECTOMY    . TOTAL KNEE ARTHROPLASTY Left 03/04/2016   Procedure: TOTAL KNEE ARTHROPLASTY;  Surgeon: Marchia Bond, MD;  Location: [facility];  Service: Orthopedics;  Laterality: Left;  . TOTAL KNEE ARTHROPLASTY Right 07/15/2016   Procedure: TOTAL KNEE  ARTHROPLASTY;  Surgeon: Marchia Bond, MD;  Location: [facility];  Service: Orthopedics;  Laterality: Right;  . TOTAL SHOULDER ARTHROPLASTY Left 07/17/2015   Procedure: TOTAL SHOULDER ARTHROPLASTY;  Surgeon: Marchia Bond, MD;  Location: [facility];  Service: Orthopedics;  Laterality: Left;  . TRANSTHORACIC ECHOCARDIOGRAM  03/2010   EF 50-55%, normal LV size, no WMAs  . TUBAL LIGATION  1990   Past Medical History:  Diagnosis Date  . Anemia   . Anxiety   . Bipolar 1 disorder ([place])   . 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 reports that she is cured   . COPD (chronic obstructive pulmonary disease) ([place])   . Depression   . Fibromyalgia   . GERD (gastroesophageal reflux disease)   . Headache    migraine's in 20's  . Hypertension   . Insomnia   . Obesity   . Osteoarthritis    knees, elbows   . 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 Pneumonia    developed in 07/2015- post shoulder replacement   . 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 Primary localized osteoarthritis of left knee 03/04/2016  . Primary localized osteoarthritis of right knee 07/15/2016  . PUD (peptic ulcer disease)   . Spinal stenosis    BP 113/75 (BP Location: Right Arm, Patient Position: Sitting, Cuff Size: Large)   Pulse 91   Resp 14   Ht 5\' 7"  (1.702 m)   Wt 274 lb (124.3 kg)   SpO2 94%   BMI 42.91 kg/m   Opioid Risk Score:   Fall Risk Score:  `1  Depression screen PHQ 2/9  Depression screen [facility] 2/9 03/04/2018 10/28/2017 09/01/2017 08/04/2017 06/09/2017 05/08/2017 03/13/2017  Decreased Interest 1 1 0 2 2 2 2   Down, Depressed, Hopeless 1 1 0 1 1 1 1   PHQ - 2 Score 2 2 0 3 3 3 3   Altered sleeping - - - - - - -  Tired, decreased energy - - - - - - -  Change in appetite - - - - - - -  Feeling bad or failure about yourself  - - - - - - -  Trouble concentrating - - - - - - -  Moving slowly or fidgety/restless - - - - - - -  Suicidal thoughts - - - - - - -  PHQ-9 Score - -  - - - - -  Difficult doing work/chores - - - - - - -    Review of Systems  Constitutional: Negative.   HENT:  Negative.   Eyes: Negative.   Respiratory: Negative.   Cardiovascular: Negative.   Gastrointestinal: Negative.   Endocrine: Negative.   Genitourinary: Negative.   Musculoskeletal: Positive for arthralgias, back pain and myalgias.  Skin: Negative.   Allergic/Immunologic: Negative.   Neurological: Positive for tremors.  Hematological: Negative.        Objective:   Physical Exam  Constitutional: She is oriented to person, place, and time. She appears well-developed and well-nourished.  HENT:  Head: Normocephalic and atraumatic.  Neck: Normal range of motion. Neck supple.  Cardiovascular: Normal rate and regular rhythm.  Pulmonary/Chest: Effort normal and breath sounds normal.  Musculoskeletal:  Normal Muscle Bulk and Muscle Testing Reveals:  Upper Extremities: Full ROM and Muscle Strength 5/5 Right AC Joint Tenderness Lumbar Paraspinal Tenderness: L-3-L-5 Lower Extremities: Full ROM and Muscle Strength 5/5 Arises from Table with ease Narrow Based Gait  Neurological: She is alert and oriented to person, place, and time.  Skin: Skin is warm and dry.  Psychiatric: She has a normal mood and affect.  Nursing note and vitals reviewed.         Assessment & Plan:  1. Fibromyalgia Syndrome: Continue with exercise and heat therapy. Encouraged to increase activity as tolerated. 04/05/2018 2. Osteoarthritis of Bilateral Knees: Continue Voltaren Gel./ Lidoderm Patches 04/05/2018. Orthopedist Following: Continue with heat and exercise therapy. S/P Left Knee Arthroplasty on 03/04/16 and S/P Right Knee Arthroplasty on 07/15/2016 by Dr. Mardelle Matte. 3. Left Shoulder OA: S/PLeft Shoulder Arthroplasty. Orthopedist Following. Dr. Mardelle Matte. 04/05/2018 4. Right Shoulder Tendonitis: Schedule for cortisone injection. 04/05/2018. 5. Bipolar Syndrome: On Depakote and Cymbalta. PCP  Following. 04/05/2018 6. Lumbar Spondylosis: Refilled: Oxycodone 15 mg one tablet three times a day #90. 04/05/2018. We will continue the opioid monitoring program, this consists of regular clinic visits, examinations, urine drug screen, pill counts as well as use of [place] Controlled Substance Reporting System. 7. Muscle Spasms: Continuecurrent medication regimen withBacolen TID as needed.04/05/2018 8. Constipation: Continue Senna.04/05/2018 9. 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 04/05/2018 11. Left Greater Trochanter Bursitis:No complaints Today.Continue to Alternate Ice and Heat Therapy: Continue to Monitor. 04/05/2018.   20 minutes of face to face patient care time was spent during this visit. All questions were encouraged and answered.   F/U in 1 month

## 2018-04-06 ENCOUNTER — Ambulatory Visit: Payer: Medicare HMO | Admitting: Registered Nurse

## 2018-04-06 ENCOUNTER — Encounter: Payer: Medicare HMO | Admitting: Registered Nurse

## 2018-04-28 ENCOUNTER — Encounter: Payer: Medicare HMO | Attending: Physical Medicine and Rehabilitation | Admitting: Physical Medicine & Rehabilitation

## 2018-04-28 DIAGNOSIS — M19042 Primary osteoarthritis, left hand: Secondary | ICD-10-CM | POA: Insufficient documentation

## 2018-04-28 DIAGNOSIS — I10 Essential (primary) hypertension: Secondary | ICD-10-CM | POA: Insufficient documentation

## 2018-04-28 DIAGNOSIS — G8929 Other chronic pain: Secondary | ICD-10-CM | POA: Insufficient documentation

## 2018-04-28 DIAGNOSIS — J449 Chronic obstructive pulmonary disease, unspecified: Secondary | ICD-10-CM | POA: Insufficient documentation

## 2018-04-28 DIAGNOSIS — M797 Fibromyalgia: Secondary | ICD-10-CM | POA: Insufficient documentation

## 2018-04-28 DIAGNOSIS — G629 Polyneuropathy, unspecified: Secondary | ICD-10-CM | POA: Insufficient documentation

## 2018-04-28 DIAGNOSIS — F17210 Nicotine dependence, cigarettes, uncomplicated: Secondary | ICD-10-CM | POA: Insufficient documentation

## 2018-04-28 DIAGNOSIS — E669 Obesity, unspecified: Secondary | ICD-10-CM | POA: Insufficient documentation

## 2018-04-28 DIAGNOSIS — G4700 Insomnia, unspecified: Secondary | ICD-10-CM | POA: Insufficient documentation

## 2018-04-28 DIAGNOSIS — Z79899 Other long term (current) drug therapy: Secondary | ICD-10-CM | POA: Insufficient documentation

## 2018-04-28 DIAGNOSIS — Z5181 Encounter for therapeutic drug level monitoring: Secondary | ICD-10-CM | POA: Insufficient documentation

## 2018-04-28 DIAGNOSIS — F319 Bipolar disorder, unspecified: Secondary | ICD-10-CM | POA: Insufficient documentation

## 2018-04-28 DIAGNOSIS — Z760 Encounter for issue of repeat prescription: Secondary | ICD-10-CM | POA: Insufficient documentation

## 2018-04-28 DIAGNOSIS — M19012 Primary osteoarthritis, left shoulder: Secondary | ICD-10-CM | POA: Insufficient documentation

## 2018-04-28 DIAGNOSIS — M47817 Spondylosis without myelopathy or radiculopathy, lumbosacral region: Secondary | ICD-10-CM | POA: Insufficient documentation

## 2018-04-28 DIAGNOSIS — M62838 Other muscle spasm: Secondary | ICD-10-CM | POA: Insufficient documentation

## 2018-04-28 DIAGNOSIS — K5900 Constipation, unspecified: Secondary | ICD-10-CM | POA: Insufficient documentation

## 2018-04-28 DIAGNOSIS — F419 Anxiety disorder, unspecified: Secondary | ICD-10-CM | POA: Insufficient documentation

## 2018-04-28 DIAGNOSIS — M25551 Pain in right hip: Secondary | ICD-10-CM | POA: Insufficient documentation

## 2018-04-28 DIAGNOSIS — M170 Bilateral primary osteoarthritis of knee: Secondary | ICD-10-CM | POA: Insufficient documentation

## 2018-04-28 DIAGNOSIS — M25552 Pain in left hip: Secondary | ICD-10-CM | POA: Insufficient documentation

## 2018-04-28 DIAGNOSIS — M19041 Primary osteoarthritis, right hand: Secondary | ICD-10-CM | POA: Insufficient documentation

## 2018-05-05 ENCOUNTER — Encounter: Payer: Medicare HMO | Admitting: Registered Nurse

## 2018-05-05 ENCOUNTER — Encounter: Payer: Self-pay | Admitting: Registered Nurse

## 2018-05-05 ENCOUNTER — Other Ambulatory Visit: Payer: Self-pay

## 2018-05-05 VITALS — BP 117/81 | HR 89 | Ht 62.0 in | Wt 271.2 lb

## 2018-05-05 DIAGNOSIS — M170 Bilateral primary osteoarthritis of knee: Secondary | ICD-10-CM

## 2018-05-05 DIAGNOSIS — G609 Hereditary and idiopathic neuropathy, unspecified: Secondary | ICD-10-CM | POA: Diagnosis not present

## 2018-05-05 DIAGNOSIS — Z79899 Other long term (current) drug therapy: Secondary | ICD-10-CM

## 2018-05-05 DIAGNOSIS — M25552 Pain in left hip: Secondary | ICD-10-CM | POA: Diagnosis not present

## 2018-05-05 DIAGNOSIS — M2550 Pain in unspecified joint: Secondary | ICD-10-CM

## 2018-05-05 DIAGNOSIS — J449 Chronic obstructive pulmonary disease, unspecified: Secondary | ICD-10-CM | POA: Diagnosis not present

## 2018-05-05 DIAGNOSIS — F319 Bipolar disorder, unspecified: Secondary | ICD-10-CM | POA: Diagnosis not present

## 2018-05-05 DIAGNOSIS — I10 Essential (primary) hypertension: Secondary | ICD-10-CM | POA: Diagnosis not present

## 2018-05-05 DIAGNOSIS — Z5181 Encounter for therapeutic drug level monitoring: Secondary | ICD-10-CM | POA: Diagnosis not present

## 2018-05-05 DIAGNOSIS — E669 Obesity, unspecified: Secondary | ICD-10-CM | POA: Diagnosis not present

## 2018-05-05 DIAGNOSIS — M19042 Primary osteoarthritis, left hand: Secondary | ICD-10-CM | POA: Diagnosis not present

## 2018-05-05 DIAGNOSIS — M47817 Spondylosis without myelopathy or radiculopathy, lumbosacral region: Secondary | ICD-10-CM | POA: Diagnosis not present

## 2018-05-05 DIAGNOSIS — G8929 Other chronic pain: Secondary | ICD-10-CM | POA: Diagnosis present

## 2018-05-05 DIAGNOSIS — G894 Chronic pain syndrome: Secondary | ICD-10-CM

## 2018-05-05 DIAGNOSIS — M47816 Spondylosis without myelopathy or radiculopathy, lumbar region: Secondary | ICD-10-CM | POA: Diagnosis not present

## 2018-05-05 DIAGNOSIS — M25551 Pain in right hip: Secondary | ICD-10-CM | POA: Diagnosis not present

## 2018-05-05 DIAGNOSIS — F17210 Nicotine dependence, cigarettes, uncomplicated: Secondary | ICD-10-CM | POA: Diagnosis not present

## 2018-05-05 DIAGNOSIS — M62838 Other muscle spasm: Secondary | ICD-10-CM | POA: Diagnosis not present

## 2018-05-05 DIAGNOSIS — M797 Fibromyalgia: Secondary | ICD-10-CM | POA: Diagnosis not present

## 2018-05-05 DIAGNOSIS — M19012 Primary osteoarthritis, left shoulder: Secondary | ICD-10-CM | POA: Diagnosis not present

## 2018-05-05 DIAGNOSIS — Z760 Encounter for issue of repeat prescription: Secondary | ICD-10-CM | POA: Diagnosis not present

## 2018-05-05 DIAGNOSIS — K5900 Constipation, unspecified: Secondary | ICD-10-CM | POA: Diagnosis not present

## 2018-05-05 DIAGNOSIS — G4700 Insomnia, unspecified: Secondary | ICD-10-CM | POA: Diagnosis not present

## 2018-05-05 DIAGNOSIS — F419 Anxiety disorder, unspecified: Secondary | ICD-10-CM | POA: Diagnosis not present

## 2018-05-05 DIAGNOSIS — G629 Polyneuropathy, unspecified: Secondary | ICD-10-CM | POA: Diagnosis not present

## 2018-05-05 DIAGNOSIS — M19041 Primary osteoarthritis, right hand: Secondary | ICD-10-CM | POA: Diagnosis not present

## 2018-05-05 MED ORDER — OXYCODONE HCL 15 MG PO TABS: 15.0000 mg | ORAL_TABLET | Freq: Three times a day (TID) | ORAL | 0 refills | Status: DC | PRN

## 2018-05-05 NOTE — Progress Notes (Signed)
Subjective:    Patient ID: [name], female    DOB: 1966/05/03, 52 y.o.   MRN: [id number]  HPI: [name] is a 52 year old female who returns for follow up appointment for chronic pain and medication refill. She states her pain is located in her lower back. She rates her pain 7. Her current exercise regime is walking.   [name] Equivalent is 67.50 MME. She is also prescribed Alprazolam  by Jerline Pain NP. We have discussed the black box warning of using opioids and benzodiazepines. I highlighted the dangers of using these drugs together and discussed the adverse events including respiratory suppression, overdose, cognitive impairment and importance of compliance with current regimen. We will continue to monitor and adjust as indicated.   Last UDS was Performed on 11/25/2017, it was consistent.   Ms. Minish working 25 hours a week.    Pain Inventory Average Pain 6 Pain Right Now 7 My pain is burning, stabbing and aching  In the last 24 hours, has pain interfered with the following? General activity 7 Relation with others 5 Enjoyment of life 6 What TIME of day is your pain at its worst? evening Sleep (in general) Fair  Pain is worse with: walking, sitting and some activites Pain improves with: rest and medication Relief from Meds: 6  Mobility walk without assistance how many minutes can you walk? 4 hrs ability to climb steps?  yes do you drive?  yes  Function employed # of hrs/week 25  Neuro/Psych bladder control problems  Prior Studies Any changes since last visit?  no  Physicians involved in your care Any changes since last visit?  no   Family History  Problem Relation Age of Onset  . Schizophrenia Mother   . Hypertension Mother   . Alzheimer's disease Mother   . Stroke Father   . Diabetes Other   . Hypertension Other    Social History   Socioeconomic History  . Marital status: Divorced    Spouse name: Not on file  . Number  of children: Not on file  . Years of education: Not on file  . Highest education level: Not on file  Occupational History  . Not on file  Social Needs  . Financial resource strain: Not on file  . Food insecurity:    Worry: Not on file    Inability: Not on file  . Transportation needs:    Medical: Not on file    Non-medical: Not on file  Tobacco Use  . Smoking status: Current Every Day Smoker    Packs/day: 1.00    Years: 36.00    Pack years: 36.00    Types: Cigarettes  . Smokeless tobacco: Never Used  Substance and Sexual Activity  . Alcohol use: Yes    Alcohol/week: 0.6 oz    Types: 1 Shots of liquor per week    Comment: occasional  . Drug use: No    Types: Morphine, Oxycodone    Comment: goes to pain clinic  . Sexual activity: Not on file  Lifestyle  . Physical activity:    Days per week: Not on file    Minutes per session: Not on file  . Stress: Not on file  Relationships  . Social connections:    Talks on phone: Not on file    Gets together: Not on file    Attends religious service: Not on file    Active member of club or organization: Not on  file    Attends meetings of clubs or organizations: Not on file    Relationship status: Not on file  Other Topics Concern  . Not on file  Social History Narrative  . Not on file   Past Surgical History:  Procedure Laterality Date  . ABDOMINAL HYSTERECTOMY  1998  . CARDIOVASCULAR STRESS TEST  03/2010   Lexiscan Myoview: EF 55%, mild breast attenuation, no ischemia or scar  . DILATION AND CURETTAGE OF UTERUS  1986   "lost a son"  . esophageal ulcer    . JOINT REPLACEMENT    . KNEE ARTHROSCOPY Right 2005  . TONSILLECTOMY    . TOTAL KNEE ARTHROPLASTY Left 03/04/2016   Procedure: TOTAL KNEE ARTHROPLASTY;  Surgeon: Marchia Bond, MD;  Location: [place];  Service: Orthopedics;  Laterality: Left;  . TOTAL KNEE ARTHROPLASTY Right 07/15/2016   Procedure: TOTAL KNEE ARTHROPLASTY;  Surgeon: Marchia Bond, MD;  Location: [place];   Service: Orthopedics;  Laterality: Right;  . TOTAL SHOULDER ARTHROPLASTY Left 07/17/2015   Procedure: TOTAL SHOULDER ARTHROPLASTY;  Surgeon: Marchia Bond, MD;  Location: [place];  Service: Orthopedics;  Laterality: Left;  . TRANSTHORACIC ECHOCARDIOGRAM  03/2010   EF 50-55%, normal LV size, no WMAs  . TUBAL LIGATION  1990   Past Medical History:  Diagnosis Date  . Anemia   . Anxiety   . Bipolar 1 disorder ([place])   . 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 reports that she is cured   . COPD (chronic obstructive pulmonary disease) ([place])   . Depression   . Fibromyalgia   . GERD (gastroesophageal reflux disease)   . Headache    migraine's in 20's  . Hypertension   . Insomnia   . Obesity   . Osteoarthritis    knees, elbows   . 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 Pneumonia    developed in 07/2015- post shoulder replacement   . 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 Primary localized osteoarthritis of left knee 03/04/2016  . Primary localized osteoarthritis of right knee 07/15/2016  . PUD (peptic ulcer disease)   . Spinal stenosis    BP 117/81   Pulse 89   Ht 5\' 2"  (1.575 m) Comment: pt reported  Wt 271 lb 3.2 oz (123 kg)   SpO2 95%   BMI 49.60 kg/m   Opioid Risk Score:   Fall Risk Score:  `1  Depression screen PHQ 2/9  Depression screen [facility] 2/9 05/05/2018 03/04/2018 10/28/2017 09/01/2017 08/04/2017 06/09/2017 05/08/2017  Decreased Interest 0 1 1 0 2 2 2   Down, Depressed, Hopeless 0 1 1 0 1 1 1   PHQ - 2 Score 0 2 2 0 3 3 3   Altered sleeping - - - - - - -  Tired, decreased energy - - - - - - -  Change in appetite - - - - - - -  Feeling bad or failure about yourself  - - - - - - -  Trouble concentrating - - - - - - -  Moving slowly or fidgety/restless - - - - - - -  Suicidal thoughts - - - - - - -  PHQ-9 Score - - - - - - -  Difficult doing work/chores - - - - - - -    Review of Systems  Constitutional: Negative.   HENT:  Negative.   Eyes: Negative.   Respiratory: Negative.   Cardiovascular: Negative.   Gastrointestinal: Negative.  Endocrine: Negative.   Genitourinary: Negative.   Musculoskeletal: Negative.   Skin: Negative.   Allergic/Immunologic: Negative.   Neurological: Negative.   Hematological: Negative.   Psychiatric/Behavioral: Negative.   All other systems reviewed and are negative.      Objective:   Physical Exam  Constitutional: She is oriented to person, place, and time. She appears well-developed and well-nourished.  HENT:  Head: Normocephalic and atraumatic.  Neck: Normal range of motion. Neck supple.  Cardiovascular: Normal rate and regular rhythm.  Pulmonary/Chest: Effort normal and breath sounds normal.  Musculoskeletal:  Normal Muscle Bulk and Muscle Testing Reveals: Upper Extremities: Full ROM and Muscle Strength 5/5 Lumbar Paraspinal Tenderness: L-3-L-5 Lower Extremities: Full ROM and Muscle Strength 5/5 Arises from Table with ease Narrow Based gait  Neurological: She is alert and oriented to person, place, and time.  Skin: Skin is warm and dry.  Psychiatric: She has a normal mood and affect. Her behavior is normal.  Nursing note and vitals reviewed.         Assessment & Plan:  1. Fibromyalgia Syndrome: Continue with exercise and heat therapy. Encouraged to increase activity as tolerated. 05/05/2018 2. Osteoarthritis of Bilateral Knees: Continue Voltaren Gel./ Lidoderm Patches 05/05/2018. Orthopedist Following: Continue with heat and exercise therapy. S/P Left Knee Arthroplasty on 03/04/16 and S/P Right Knee Arthroplasty on 07/15/2016 by Dr. Mardelle Matte. 3. Left Shoulder OA: S/PLeft Shoulder Arthroplasty. Orthopedist Following. Dr. Mardelle Matte. 05/05/2018 4. Right Shoulder Tendonitis: Schedule for cortisone injection. 05/05/2018. 5. Bipolar Syndrome: On Depakote and Cymbalta. PCP Following. 05/05/2018 6. Lumbar Spondylosis: Refilled: Oxycodone 15 mg one tablet three times  a day #90. 05/05/2018. We will continue the opioid monitoring program, this consists of regular clinic visits, examinations, urine drug screen, pill counts as well as use of [place] Controlled Substance Reporting System. 7. Muscle Spasms: Continuecurrent medication regimen withBacolen TID as needed.05/05/2018 8. Constipation: Continue Senna.05/05/2018 9. 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 05/05/2018 11. Left Greater Trochanter Bursitis:No complaints Today.Continue to Alternate Ice and Heat Therapy: Continue to Monitor. 05/05/2018.   20 minutes of face to face patient care time was spent during this visit. All questions were encouraged and answered.   F/U in 1 month

## 2018-05-19 ENCOUNTER — Other Ambulatory Visit: Payer: Self-pay | Admitting: Radiology

## 2018-06-07 ENCOUNTER — Encounter: Payer: Medicare HMO | Attending: Physical Medicine and Rehabilitation | Admitting: Physical Medicine & Rehabilitation

## 2018-06-07 ENCOUNTER — Encounter: Payer: Self-pay | Admitting: Physical Medicine & Rehabilitation

## 2018-06-07 ENCOUNTER — Other Ambulatory Visit: Payer: Self-pay

## 2018-06-07 VITALS — BP 102/70 | HR 94 | Ht 62.0 in | Wt 271.6 lb

## 2018-06-07 DIAGNOSIS — M47817 Spondylosis without myelopathy or radiculopathy, lumbosacral region: Secondary | ICD-10-CM | POA: Diagnosis not present

## 2018-06-07 DIAGNOSIS — F17210 Nicotine dependence, cigarettes, uncomplicated: Secondary | ICD-10-CM | POA: Insufficient documentation

## 2018-06-07 DIAGNOSIS — F319 Bipolar disorder, unspecified: Secondary | ICD-10-CM | POA: Insufficient documentation

## 2018-06-07 DIAGNOSIS — M19012 Primary osteoarthritis, left shoulder: Secondary | ICD-10-CM | POA: Insufficient documentation

## 2018-06-07 DIAGNOSIS — M62838 Other muscle spasm: Secondary | ICD-10-CM | POA: Insufficient documentation

## 2018-06-07 DIAGNOSIS — M25552 Pain in left hip: Secondary | ICD-10-CM | POA: Diagnosis not present

## 2018-06-07 DIAGNOSIS — E669 Obesity, unspecified: Secondary | ICD-10-CM | POA: Diagnosis not present

## 2018-06-07 DIAGNOSIS — Z79899 Other long term (current) drug therapy: Secondary | ICD-10-CM | POA: Diagnosis not present

## 2018-06-07 DIAGNOSIS — Z760 Encounter for issue of repeat prescription: Secondary | ICD-10-CM | POA: Insufficient documentation

## 2018-06-07 DIAGNOSIS — M19041 Primary osteoarthritis, right hand: Secondary | ICD-10-CM | POA: Diagnosis not present

## 2018-06-07 DIAGNOSIS — K5900 Constipation, unspecified: Secondary | ICD-10-CM | POA: Diagnosis not present

## 2018-06-07 DIAGNOSIS — M797 Fibromyalgia: Secondary | ICD-10-CM | POA: Diagnosis not present

## 2018-06-07 DIAGNOSIS — G629 Polyneuropathy, unspecified: Secondary | ICD-10-CM | POA: Diagnosis not present

## 2018-06-07 DIAGNOSIS — M19042 Primary osteoarthritis, left hand: Secondary | ICD-10-CM | POA: Diagnosis not present

## 2018-06-07 DIAGNOSIS — G4700 Insomnia, unspecified: Secondary | ICD-10-CM | POA: Diagnosis not present

## 2018-06-07 DIAGNOSIS — M170 Bilateral primary osteoarthritis of knee: Secondary | ICD-10-CM

## 2018-06-07 DIAGNOSIS — M25551 Pain in right hip: Secondary | ICD-10-CM | POA: Insufficient documentation

## 2018-06-07 DIAGNOSIS — I10 Essential (primary) hypertension: Secondary | ICD-10-CM | POA: Diagnosis not present

## 2018-06-07 DIAGNOSIS — J449 Chronic obstructive pulmonary disease, unspecified: Secondary | ICD-10-CM | POA: Insufficient documentation

## 2018-06-07 DIAGNOSIS — F419 Anxiety disorder, unspecified: Secondary | ICD-10-CM | POA: Insufficient documentation

## 2018-06-07 DIAGNOSIS — G8929 Other chronic pain: Secondary | ICD-10-CM | POA: Insufficient documentation

## 2018-06-07 DIAGNOSIS — Z5181 Encounter for therapeutic drug level monitoring: Secondary | ICD-10-CM | POA: Diagnosis not present

## 2018-06-07 DIAGNOSIS — M6530 Trigger finger, unspecified finger: Secondary | ICD-10-CM

## 2018-06-07 MED ORDER — OXYCODONE HCL 15 MG PO TABS: 15.0000 mg | ORAL_TABLET | Freq: Three times a day (TID) | ORAL | 0 refills | Status: DC | PRN

## 2018-06-07 NOTE — Progress Notes (Signed)
Procedure: Trigger figure injection   Dx: bilateral Trigger fingers     After informed consent and preparation of the skin with betadine and isopropyl alcohol, I injected 3mg  (0.5cc) of celestone and 2cc of 1% lidocaine around the bilateral thumbs and left 4th  finger flexor tendons each at the A1 pulley via anterior approach. Additionally, aspiration was performed prior to injection. The patient tolerated well, and no complications were encountered. Afterward the area was cleaned and dressed. Post- injection instructions were provided.

## 2018-06-07 NOTE — Patient Instructions (Signed)
PLEASE FEEL FREE TO CALL OUR OFFICE WITH ANY PROBLEMS OR QUESTIONS ([phone])      

## 2018-07-06 ENCOUNTER — Encounter: Payer: Medicare HMO | Admitting: Registered Nurse

## 2018-07-08 ENCOUNTER — Other Ambulatory Visit: Payer: Self-pay

## 2018-07-08 ENCOUNTER — Encounter: Payer: Medicare HMO | Attending: Physical Medicine and Rehabilitation | Admitting: Registered Nurse

## 2018-07-08 ENCOUNTER — Encounter: Payer: Self-pay | Admitting: Registered Nurse

## 2018-07-08 ENCOUNTER — Encounter: Payer: Medicare HMO | Admitting: Registered Nurse

## 2018-07-08 VITALS — BP 111/74 | HR 95 | Ht 62.0 in | Wt 275.2 lb

## 2018-07-08 DIAGNOSIS — M47817 Spondylosis without myelopathy or radiculopathy, lumbosacral region: Secondary | ICD-10-CM | POA: Diagnosis not present

## 2018-07-08 DIAGNOSIS — Z760 Encounter for issue of repeat prescription: Secondary | ICD-10-CM | POA: Diagnosis not present

## 2018-07-08 DIAGNOSIS — M62838 Other muscle spasm: Secondary | ICD-10-CM | POA: Diagnosis not present

## 2018-07-08 DIAGNOSIS — G8929 Other chronic pain: Secondary | ICD-10-CM | POA: Diagnosis not present

## 2018-07-08 DIAGNOSIS — F17210 Nicotine dependence, cigarettes, uncomplicated: Secondary | ICD-10-CM | POA: Diagnosis not present

## 2018-07-08 DIAGNOSIS — M797 Fibromyalgia: Secondary | ICD-10-CM | POA: Insufficient documentation

## 2018-07-08 DIAGNOSIS — G4700 Insomnia, unspecified: Secondary | ICD-10-CM | POA: Diagnosis not present

## 2018-07-08 DIAGNOSIS — F319 Bipolar disorder, unspecified: Secondary | ICD-10-CM | POA: Diagnosis not present

## 2018-07-08 DIAGNOSIS — I10 Essential (primary) hypertension: Secondary | ICD-10-CM | POA: Diagnosis not present

## 2018-07-08 DIAGNOSIS — M170 Bilateral primary osteoarthritis of knee: Secondary | ICD-10-CM

## 2018-07-08 DIAGNOSIS — G629 Polyneuropathy, unspecified: Secondary | ICD-10-CM | POA: Diagnosis not present

## 2018-07-08 DIAGNOSIS — M19012 Primary osteoarthritis, left shoulder: Secondary | ICD-10-CM | POA: Diagnosis not present

## 2018-07-08 DIAGNOSIS — M7061 Trochanteric bursitis, right hip: Secondary | ICD-10-CM

## 2018-07-08 DIAGNOSIS — M25551 Pain in right hip: Secondary | ICD-10-CM | POA: Diagnosis not present

## 2018-07-08 DIAGNOSIS — E669 Obesity, unspecified: Secondary | ICD-10-CM | POA: Insufficient documentation

## 2018-07-08 DIAGNOSIS — Z79899 Other long term (current) drug therapy: Secondary | ICD-10-CM | POA: Diagnosis not present

## 2018-07-08 DIAGNOSIS — M19041 Primary osteoarthritis, right hand: Secondary | ICD-10-CM | POA: Insufficient documentation

## 2018-07-08 DIAGNOSIS — M25552 Pain in left hip: Secondary | ICD-10-CM | POA: Insufficient documentation

## 2018-07-08 DIAGNOSIS — M2550 Pain in unspecified joint: Secondary | ICD-10-CM

## 2018-07-08 DIAGNOSIS — Z5181 Encounter for therapeutic drug level monitoring: Secondary | ICD-10-CM

## 2018-07-08 DIAGNOSIS — M19042 Primary osteoarthritis, left hand: Secondary | ICD-10-CM | POA: Diagnosis not present

## 2018-07-08 DIAGNOSIS — K5900 Constipation, unspecified: Secondary | ICD-10-CM | POA: Diagnosis not present

## 2018-07-08 DIAGNOSIS — G609 Hereditary and idiopathic neuropathy, unspecified: Secondary | ICD-10-CM | POA: Diagnosis not present

## 2018-07-08 DIAGNOSIS — M47816 Spondylosis without myelopathy or radiculopathy, lumbar region: Secondary | ICD-10-CM | POA: Diagnosis not present

## 2018-07-08 DIAGNOSIS — J449 Chronic obstructive pulmonary disease, unspecified: Secondary | ICD-10-CM | POA: Diagnosis not present

## 2018-07-08 DIAGNOSIS — G894 Chronic pain syndrome: Secondary | ICD-10-CM

## 2018-07-08 DIAGNOSIS — F419 Anxiety disorder, unspecified: Secondary | ICD-10-CM | POA: Diagnosis not present

## 2018-07-08 DIAGNOSIS — M7062 Trochanteric bursitis, left hip: Secondary | ICD-10-CM

## 2018-07-08 MED ORDER — OXYCODONE HCL 15 MG PO TABS: 15.0000 mg | ORAL_TABLET | Freq: Three times a day (TID) | ORAL | 0 refills | Status: DC | PRN

## 2018-07-08 NOTE — Progress Notes (Signed)
Subjective:    Patient ID: [name], female    DOB: [birth year], 52 y.o.   MRN: [id number]  HPI: [name] is a 52 year old female who returns for follow up appointment for chronic pain and medication refill. She states her pain is located in her lower back and also reports generalized pain all over. She states for the last few days she has been dealing with congestion and was encouraged to follow up with her PCP, she verbalizes understanding.   [name] is 67.50 MME. She is also prescribed Alprazolam by Dr. Tammi Klippel. We have discussed the black box warning of using opioids and benzodiazepines. I highlighted the dangers of using these drugs together and discussed the adverse events including respiratory suppression, overdose, cognitive impairment and importance of compliance with current regimen. We will continue to monitor and adjust as indicated.   S/P Trigger Finger Injection with relief noted.   Last UDS was Performed on 11/25/2017, it was consistent.   Pain Inventory Average Pain 6 Pain Right Now 7 My pain is sharp, stabbing and tingling  In the last 24 hours, has pain interfered with the following? General activity 6 Relation with others 5 Enjoyment of life 5 What TIME of day is your pain at its worst? evening Sleep (in general) Fair  Pain is worse with: standing Pain improves with: rest and medication Relief from Meds: 6  Mobility walk without assistance how many minutes can you walk? 5 hrs ability to climb steps?  yes do you drive?  yes  Function employed # of hrs/week 28  Neuro/Psych No problems in this area  Prior Studies Any changes since last visit?  no  Physicians involved in your care Any changes since last visit?  no   Family History  Problem Relation Age of Onset  . Schizophrenia Mother   . Hypertension Mother   . Alzheimer's disease Mother   . Stroke Father   . Diabetes Other   . Hypertension Other     Social History   Socioeconomic History  . Marital status: Divorced    Spouse name: Not on file  . Number of children: Not on file  . Years of education: Not on file  . Highest education level: Not on file  Occupational History  . Not on file  Social Needs  . Financial resource strain: Not on file  . Food insecurity:    Worry: Not on file    Inability: Not on file  . Transportation needs:    Medical: Not on file    Non-medical: Not on file  Tobacco Use  . Smoking status: Current Every Day Smoker    Packs/day: 1.00    Years: 36.00    Pack years: 36.00    Types: Cigarettes  . Smokeless tobacco: Never Used  Substance and Sexual Activity  . Alcohol use: Yes    Alcohol/week: 1.0 standard drinks    Types: 1 Shots of liquor per week    Comment: occasional  . Drug use: No    Types: Morphine, Oxycodone    Comment: goes to pain clinic  . Sexual activity: Not on file  Lifestyle  . Physical activity:    Days per week: Not on file    Minutes per session: Not on file  . Stress: Not on file  Relationships  . Social connections:    Talks on phone: Not on file    Gets together: Not on file  Attends religious service: Not on file    Active member of club or organization: Not on file    Attends meetings of clubs or organizations: Not on file    Relationship status: Not on file  Other Topics Concern  . Not on file  Social History Narrative  . Not on file   Past Surgical History:  Procedure Laterality Date  . ABDOMINAL HYSTERECTOMY  1998  . CARDIOVASCULAR STRESS TEST  03/2010   Lexiscan Myoview: EF 55%, mild breast attenuation, no ischemia or scar  . DILATION AND CURETTAGE OF UTERUS  1986   "lost a son"  . esophageal ulcer    . JOINT REPLACEMENT    . KNEE ARTHROSCOPY Right 2005  . TONSILLECTOMY    . TOTAL KNEE ARTHROPLASTY Left 03/04/2016   Procedure: TOTAL KNEE ARTHROPLASTY;  Surgeon: Marchia Bond, MD;  Location: [place];  Service: Orthopedics;  Laterality: Left;  .  TOTAL KNEE ARTHROPLASTY Right 07/15/2016   Procedure: TOTAL KNEE ARTHROPLASTY;  Surgeon: Marchia Bond, MD;  Location: [place];  Service: Orthopedics;  Laterality: Right;  . TOTAL SHOULDER ARTHROPLASTY Left 07/17/2015   Procedure: TOTAL SHOULDER ARTHROPLASTY;  Surgeon: Marchia Bond, MD;  Location: [place];  Service: Orthopedics;  Laterality: Left;  . TRANSTHORACIC ECHOCARDIOGRAM  03/2010   EF 50-55%, normal LV size, no WMAs  . TUBAL LIGATION  1990   Past Medical History:  Diagnosis Date  . Anemia   . Anxiety   . Bipolar 1 disorder ([place])   . 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 reports that she is cured   . COPD (chronic obstructive pulmonary disease) ([place])   . Depression   . Fibromyalgia   . GERD (gastroesophageal reflux disease)   . Headache    migraine's in 20's  . Hypertension   . Insomnia   . Obesity   . Osteoarthritis    knees, elbows   . 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 Pneumonia    developed in 07/2015- post shoulder replacement   . 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 Primary localized osteoarthritis of left knee 03/04/2016  . Primary localized osteoarthritis of right knee 07/15/2016  . PUD (peptic ulcer disease)   . Spinal stenosis    BP 111/74   Pulse 95   Ht 5\' 2"  (1.575 m)   Wt 275 lb 3.2 oz (124.8 kg)   SpO2 96%   BMI 50.33 kg/m   Opioid Risk Score:   Fall Risk Score:  `1  Depression screen PHQ 2/9  Depression screen [facility] 2/9 07/08/2018 06/07/2018 05/05/2018 03/04/2018 10/28/2017 09/01/2017 08/04/2017  Decreased Interest 0 1 0 1 1 0 2  Down, Depressed, Hopeless 0 1 0 1 1 0 1  PHQ - 2 Score 0 2 0 2 2 0 3  Altered sleeping - - - - - - -  Tired, decreased energy - - - - - - -  Change in appetite - - - - - - -  Feeling bad or failure about yourself  - - - - - - -  Trouble concentrating - - - - - - -  Moving slowly or fidgety/restless - - - - - - -  Suicidal thoughts - - - - - - -  PHQ-9 Score - - - - - -  -  Difficult doing work/chores - - - - - - -    Review of Systems  Constitutional: Negative.   HENT: Negative.  Eyes: Negative.   Respiratory: Negative.   Cardiovascular: Negative.   Gastrointestinal: Negative.   Endocrine: Negative.   Genitourinary: Negative.   Musculoskeletal: Negative.   Skin: Negative.   Allergic/Immunologic: Negative.   Neurological: Negative.   Hematological: Negative.   Psychiatric/Behavioral: Negative.   All other systems reviewed and are negative.      Objective:   Physical Exam  Constitutional: She is oriented to person, place, and time. She appears well-developed and well-nourished.  HENT:  Head: Normocephalic and atraumatic.  Neck: Normal range of motion. Neck supple.  Cardiovascular: Normal rate and regular rhythm.  Pulmonary/Chest: Effort normal and breath sounds normal.  Musculoskeletal:  Normal Muscle Bulk and Muscle Testing Reveals: Upper Extremities: Full ROM and Muscle Strength 5/5 Lumbar Paraspinal Tenderness: L-3-L-5 Lower Extremities: Full ROM and Muscle Strength 5/5 Arises from Table with ease Narrow Based Gait  Neurological: She is alert and oriented to person, place, and time.  Skin: Skin is warm and dry.  Psychiatric: She has a normal mood and affect. Her behavior is normal.  Nursing note and vitals reviewed.         Assessment & Plan:  1. Fibromyalgia Syndrome: Continue with exercise and heat therapy. Encouraged to increase activity as tolerated. 07/08/2018 2. Osteoarthritis of Bilateral Knees: Continue Voltaren Gel./ Lidoderm Patches 07/08/2018. Orthopedist Following: Continue with heat and exercise therapy. S/P Left Knee Arthroplasty on 03/04/16 and S/P Right Knee Arthroplasty on 07/15/2016 by Dr. Mardelle Matte. 3. Left Shoulder OA: S/PLeft Shoulder Arthroplasty. Orthopedist Following. Dr. Mardelle Matte. 07/08/2018 4. Right Shoulder Tendonitis:No complaints today. 07/08/2018. 5. Bipolar Syndrome: On Depakote and Cymbalta. PCP  Following. 07/08/2018 6. Lumbar Spondylosis: Refilled: Oxycodone 15 mg one tablet three times a day #90. 07/08/2018. We will continue the opioid monitoring program, this consists of regular clinic visits, examinations, urine drug screen, pill counts as well as use of [place] Controlled Substance Reporting System. 7. Muscle Spasms: Continuecurrent medication regimen withBacolen TID as needed.07/08/2018 8. Constipation: Continue Senna.07/08/2018 9. 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 07/08/2018 11. Left Greater Trochanter Bursitis:No complaints Today.Continue to Alternate Ice and Heat Therapy: Continue to Monitor. 07/08/2018.   20 minutes of face to face patient care time was spent during this visit. All questions were encouraged and answered.   F/U in 1 month

## 2018-08-03 ENCOUNTER — Ambulatory Visit: Payer: Medicare HMO | Admitting: Registered Nurse

## 2018-08-03 ENCOUNTER — Encounter: Payer: Self-pay | Admitting: Registered Nurse

## 2018-08-03 ENCOUNTER — Encounter: Payer: Medicare HMO | Attending: Physical Medicine and Rehabilitation | Admitting: Registered Nurse

## 2018-08-03 VITALS — BP 108/74 | HR 84 | Ht 62.0 in | Wt 274.4 lb

## 2018-08-03 DIAGNOSIS — M19041 Primary osteoarthritis, right hand: Secondary | ICD-10-CM | POA: Diagnosis not present

## 2018-08-03 DIAGNOSIS — G8929 Other chronic pain: Secondary | ICD-10-CM | POA: Diagnosis present

## 2018-08-03 DIAGNOSIS — M542 Cervicalgia: Secondary | ICD-10-CM

## 2018-08-03 DIAGNOSIS — G894 Chronic pain syndrome: Secondary | ICD-10-CM

## 2018-08-03 DIAGNOSIS — I10 Essential (primary) hypertension: Secondary | ICD-10-CM | POA: Diagnosis not present

## 2018-08-03 DIAGNOSIS — G4700 Insomnia, unspecified: Secondary | ICD-10-CM | POA: Diagnosis not present

## 2018-08-03 DIAGNOSIS — M25551 Pain in right hip: Secondary | ICD-10-CM | POA: Diagnosis not present

## 2018-08-03 DIAGNOSIS — M797 Fibromyalgia: Secondary | ICD-10-CM | POA: Diagnosis not present

## 2018-08-03 DIAGNOSIS — E669 Obesity, unspecified: Secondary | ICD-10-CM | POA: Diagnosis not present

## 2018-08-03 DIAGNOSIS — G629 Polyneuropathy, unspecified: Secondary | ICD-10-CM | POA: Insufficient documentation

## 2018-08-03 DIAGNOSIS — F419 Anxiety disorder, unspecified: Secondary | ICD-10-CM | POA: Insufficient documentation

## 2018-08-03 DIAGNOSIS — M7061 Trochanteric bursitis, right hip: Secondary | ICD-10-CM

## 2018-08-03 DIAGNOSIS — M25552 Pain in left hip: Secondary | ICD-10-CM | POA: Diagnosis not present

## 2018-08-03 DIAGNOSIS — M7062 Trochanteric bursitis, left hip: Secondary | ICD-10-CM

## 2018-08-03 DIAGNOSIS — Z760 Encounter for issue of repeat prescription: Secondary | ICD-10-CM | POA: Diagnosis not present

## 2018-08-03 DIAGNOSIS — Z5181 Encounter for therapeutic drug level monitoring: Secondary | ICD-10-CM | POA: Diagnosis not present

## 2018-08-03 DIAGNOSIS — Z79899 Other long term (current) drug therapy: Secondary | ICD-10-CM | POA: Diagnosis not present

## 2018-08-03 DIAGNOSIS — K5900 Constipation, unspecified: Secondary | ICD-10-CM | POA: Diagnosis not present

## 2018-08-03 DIAGNOSIS — M2550 Pain in unspecified joint: Secondary | ICD-10-CM

## 2018-08-03 DIAGNOSIS — M19012 Primary osteoarthritis, left shoulder: Secondary | ICD-10-CM | POA: Diagnosis not present

## 2018-08-03 DIAGNOSIS — M170 Bilateral primary osteoarthritis of knee: Secondary | ICD-10-CM

## 2018-08-03 DIAGNOSIS — M62838 Other muscle spasm: Secondary | ICD-10-CM | POA: Diagnosis not present

## 2018-08-03 DIAGNOSIS — G609 Hereditary and idiopathic neuropathy, unspecified: Secondary | ICD-10-CM

## 2018-08-03 DIAGNOSIS — F17210 Nicotine dependence, cigarettes, uncomplicated: Secondary | ICD-10-CM | POA: Insufficient documentation

## 2018-08-03 DIAGNOSIS — M19042 Primary osteoarthritis, left hand: Secondary | ICD-10-CM | POA: Insufficient documentation

## 2018-08-03 DIAGNOSIS — F319 Bipolar disorder, unspecified: Secondary | ICD-10-CM | POA: Diagnosis not present

## 2018-08-03 DIAGNOSIS — M47816 Spondylosis without myelopathy or radiculopathy, lumbar region: Secondary | ICD-10-CM

## 2018-08-03 DIAGNOSIS — M47817 Spondylosis without myelopathy or radiculopathy, lumbosacral region: Secondary | ICD-10-CM

## 2018-08-03 DIAGNOSIS — M79641 Pain in right hand: Secondary | ICD-10-CM

## 2018-08-03 DIAGNOSIS — M79642 Pain in left hand: Secondary | ICD-10-CM

## 2018-08-03 DIAGNOSIS — J449 Chronic obstructive pulmonary disease, unspecified: Secondary | ICD-10-CM | POA: Diagnosis not present

## 2018-08-03 MED ORDER — OXYCODONE HCL 15 MG PO TABS: 15.0000 mg | ORAL_TABLET | Freq: Three times a day (TID) | ORAL | 0 refills | Status: DC | PRN

## 2018-08-03 MED ORDER — DULOXETINE HCL 60 MG PO CPEP: 60.0000 mg | ORAL_CAPSULE | Freq: Every day | ORAL | 3 refills | Status: AC

## 2018-08-03 NOTE — Progress Notes (Signed)
Subjective:    Patient ID: [name], female    DOB: June 11, 1966, 52 y.o.   MRN: [id number]  HPI: [name] is a 52 year old female who returns for follow up appointment for chronic pain and medication refill. She states her pain is located in her neck, bilateral hand pain has increased in intensityMs. [name] requesting to go to [facility] for hand splints, will obtain X-rays and will  Place a call to [employer]. Also reports lower back pain and bilateral hip pain. She rates her pain 7. Her current exercise regime is walking.   [name] Morphine Equivalent is 67.50 MME. She is also prescribed Alprazolam by Dr. Tammi Klippel. We have discussed the black box warning of using opioids and benzodiazepines. I highlighted the dangers of using these drugs together and discussed the adverse events including respiratory suppression, overdose, cognitive impairment and importance of compliance with current regimen. We will continue to monitor and adjust as indicated.   Last UDS was Performed on 11/25/2017, it was consistent. UDS ordered today.   Pain Inventory Average Pain 6 Pain Right Now 7 My pain is sharp, burning, stabbing and tingling  In the last 24 hours, has pain interfered with the following? General activity 5 Relation with others 5 Enjoyment of life 5 What TIME of day is your pain at its worst? evening Sleep (in general) Fair  Pain is worse with: walking and some activites Pain improves with: rest and medication Relief from Meds: 6  Mobility walk without assistance ability to climb steps?  yes do you drive?  yes  Function employed # of hrs/week 28  Neuro/Psych bladder control problems  Prior Studies Any changes since last visit?  no  Physicians involved in your care Any changes since last visit?  no   Family History  Problem Relation Age of Onset  . Schizophrenia Mother   . Hypertension Mother   . Alzheimer's disease Mother   . Stroke Father   . Diabetes  Other   . Hypertension Other    Social History   Socioeconomic History  . Marital status: Divorced    Spouse name: Not on file  . Number of children: Not on file  . Years of education: Not on file  . Highest education level: Not on file  Occupational History  . Not on file  Social Needs  . Financial resource strain: Not on file  . Food insecurity:    Worry: Not on file    Inability: Not on file  . Transportation needs:    Medical: Not on file    Non-medical: Not on file  Tobacco Use  . Smoking status: Current Every Day Smoker    Packs/day: 1.00    Years: 36.00    Pack years: 36.00    Types: Cigarettes  . Smokeless tobacco: Never Used  Substance and Sexual Activity  . Alcohol use: Yes    Alcohol/week: 1.0 standard drinks    Types: 1 Shots of liquor per week    Comment: occasional  . Drug use: No    Types: Morphine, Oxycodone    Comment: goes to pain clinic  . Sexual activity: Not on file  Lifestyle  . Physical activity:    Days per week: Not on file    Minutes per session: Not on file  . Stress: Not on file  Relationships  . Social connections:    Talks on phone: Not on file    Gets together: Not on file  Attends religious service: Not on file    Active member of club or organization: Not on file    Attends meetings of clubs or organizations: Not on file    Relationship status: Not on file  Other Topics Concern  . Not on file  Social History Narrative  . Not on file   Past Surgical History:  Procedure Laterality Date  . ABDOMINAL HYSTERECTOMY  1998  . CARDIOVASCULAR STRESS TEST  03/2010   Lexiscan Myoview: EF 55%, mild breast attenuation, no ischemia or scar  . DILATION AND CURETTAGE OF UTERUS  1986   "lost a son"  . esophageal ulcer    . JOINT REPLACEMENT    . KNEE ARTHROSCOPY Right 2005  . TONSILLECTOMY    . TOTAL KNEE ARTHROPLASTY Left 03/04/2016   Procedure: TOTAL KNEE ARTHROPLASTY;  Surgeon: Marchia Bond, MD;  Location: [place];  Service:  Orthopedics;  Laterality: Left;  . TOTAL KNEE ARTHROPLASTY Right 07/15/2016   Procedure: TOTAL KNEE ARTHROPLASTY;  Surgeon: Marchia Bond, MD;  Location: [place];  Service: Orthopedics;  Laterality: Right;  . TOTAL SHOULDER ARTHROPLASTY Left 07/17/2015   Procedure: TOTAL SHOULDER ARTHROPLASTY;  Surgeon: Marchia Bond, MD;  Location: [facility];  Service: Orthopedics;  Laterality: Left;  . TRANSTHORACIC ECHOCARDIOGRAM  03/2010   EF 50-55%, normal LV size, no WMAs  . TUBAL LIGATION  1990   Past Medical History:  Diagnosis Date  . Anemia   . Anxiety   . Bipolar 1 disorder ([place])   . 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 reports that she is cured   . COPD (chronic obstructive pulmonary disease) ([place])   . Depression   . Fibromyalgia   . GERD (gastroesophageal reflux disease)   . Headache    migraine's in 20's  . Hypertension   . Insomnia   . Obesity   . Osteoarthritis    knees, elbows   . 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 Pneumonia    developed in 07/2015- post shoulder replacement   . 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 Primary localized osteoarthritis of left knee 03/04/2016  . Primary localized osteoarthritis of right knee 07/15/2016  . PUD (peptic ulcer disease)   . Spinal stenosis    There were no vitals taken for this visit.  Opioid Risk Score:   Fall Risk Score:  `1  Depression screen PHQ 2/9  Depression screen [facility] 2/9 07/08/2018 06/07/2018 05/05/2018 03/04/2018 10/28/2017 09/01/2017 08/04/2017  Decreased Interest 0 1 0 1 1 0 2  Down, Depressed, Hopeless 0 1 0 1 1 0 1  PHQ - 2 Score 0 2 0 2 2 0 3  Altered sleeping - - - - - - -  Tired, decreased energy - - - - - - -  Change in appetite - - - - - - -  Feeling bad or failure about yourself  - - - - - - -  Trouble concentrating - - - - - - -  Moving slowly or fidgety/restless - - - - - - -  Suicidal thoughts - - - - - - -  PHQ-9 Score - - - - - - -  Difficult doing  work/chores - - - - - - -     Review of Systems  Constitutional: Negative.   HENT: Negative.   Eyes: Negative.   Respiratory: Negative.   Cardiovascular: Negative.   Gastrointestinal: Negative.   Endocrine: Negative.   Genitourinary: Positive for  difficulty urinating.  Musculoskeletal: Positive for arthralgias and myalgias.  Skin: Negative.   Allergic/Immunologic: Negative.   Neurological: Negative.   Hematological: Negative.   Psychiatric/Behavioral: Negative.   All other systems reviewed and are negative.      Objective:   Physical Exam  Constitutional: She is oriented to person, place, and time. She appears well-developed and well-nourished.  HENT:  Head: Normocephalic and atraumatic.  Neck: Normal range of motion. Neck supple.  Cervical Paraspinal Tenderness: C-5-C-6  Cardiovascular: Normal rate and regular rhythm.  Pulmonary/Chest: Effort normal and breath sounds normal.  Musculoskeletal:  Normal Muscle Bulk and Muscle Testing Reveals:  Upper Extremities: Full ROM and Muscle Strength 4/5 Lumbar Paraspinal Tenderness: L-4-L-5 Bilateral Greater Trochanter Tenderness Lower Extremities: Full ROM and Muscle Strength 5/5 Arises from Table with Ease  Narrow Based gait  Neurological: She is alert and oriented to person, place, and time.  Skin: Skin is warm and dry.  Psychiatric: She has a normal mood and affect. Her behavior is normal.  Nursing note and vitals reviewed.         Assessment & Plan:  1. Fibromyalgia Syndrome: Continue with exercise and heat therapy. Encouraged to increase activity as tolerated. 08/03/2018 2. Osteoarthritis of Bilateral Knees: Continue Voltaren Gel./ Lidoderm Patches 08/03/2018. Orthopedist Following: Continue with heat and exercise therapy. S/P Left Knee Arthroplasty on 03/04/16 and S/P Right Knee Arthroplasty on 07/15/2016 by Dr. Mardelle Matte. 3. Left Shoulder OA: S/PLeft Shoulder Arthroplasty. Orthopedist Following. Dr. Mardelle Matte.  08/03/2018 4. Right Shoulder Tendonitis:No complaints today. 08/03/2018. 5. Bipolar Syndrome: On Depakote and Cymbalta. PCP Following. 08/03/2018 6. Lumbar Spondylosis: Refilled: Oxycodone 15 mg one tablet three times a day #90. 08/03/2018. We will continue the opioid monitoring program, this consists of regular clinic visits, examinations, urine drug screen, pill counts as well as use of [place] Controlled Substance Reporting System. 7. Muscle Spasms: Continuecurrent medication regimen withBacolen TID as needed.08/03/2018 8. Constipation: Continue Senna.08/03/2018 9. Peripheral Neuropathy: Continue: Lidocaine Patches.08/03/2018 10.Bilateral OA of Both Hands/ Right Elbow/ Poly Arthragia: Continue: Voltaren Gel. 08/03/2018 11. Acute Bilateral Hand Pain/ Chronic Hand Pain: RX X-ray 12. Bilateral Greater Trochanter Bursitis:No complaints Today.Continue to Alternate Ice and Heat Therapy: Continue to Monitor. 08/03/2018.  13. Cervicalgia: Continue HEP as Tolerated and Alternate Ice and Heat Therapy.  14. Poly Arthralgia: Continue current medication regimen and Alternate Heat and Ice Therapy.   20 minutes of face to face patient care time was spent during this visit. All questions were encouraged and answered.   F/U in 1 month

## 2018-08-07 LAB — TOXASSURE SELECT,+ANTIDEPR,UR

## 2018-08-09 ENCOUNTER — Telehealth: Payer: Self-pay | Admitting: *Deleted

## 2018-08-09 NOTE — Telephone Encounter (Signed)
Urine drug screen for this encounter is positive for alprazolam but negative for oxycodone or its metabolites.  She had #17 pills at this encounter but reported that she had not had oxycodone in 3 days.  No mention in provider note of why she would not have taken medication.  Discussed with [name].  We will schedule a repeat UDS at next visit.

## 2018-08-31 ENCOUNTER — Encounter: Payer: Medicare HMO | Attending: Physical Medicine and Rehabilitation | Admitting: Registered Nurse

## 2018-08-31 ENCOUNTER — Encounter: Payer: Self-pay | Admitting: Registered Nurse

## 2018-08-31 ENCOUNTER — Other Ambulatory Visit: Payer: Self-pay

## 2018-08-31 VITALS — BP 130/83 | HR 108 | Ht 62.0 in | Wt 276.8 lb

## 2018-08-31 DIAGNOSIS — M19012 Primary osteoarthritis, left shoulder: Secondary | ICD-10-CM | POA: Diagnosis not present

## 2018-08-31 DIAGNOSIS — G629 Polyneuropathy, unspecified: Secondary | ICD-10-CM | POA: Diagnosis not present

## 2018-08-31 DIAGNOSIS — Z79891 Long term (current) use of opiate analgesic: Secondary | ICD-10-CM | POA: Diagnosis not present

## 2018-08-31 DIAGNOSIS — G894 Chronic pain syndrome: Secondary | ICD-10-CM

## 2018-08-31 DIAGNOSIS — Z79899 Other long term (current) drug therapy: Secondary | ICD-10-CM | POA: Insufficient documentation

## 2018-08-31 DIAGNOSIS — G4700 Insomnia, unspecified: Secondary | ICD-10-CM | POA: Diagnosis not present

## 2018-08-31 DIAGNOSIS — Z760 Encounter for issue of repeat prescription: Secondary | ICD-10-CM | POA: Insufficient documentation

## 2018-08-31 DIAGNOSIS — M797 Fibromyalgia: Secondary | ICD-10-CM | POA: Diagnosis not present

## 2018-08-31 DIAGNOSIS — Z5181 Encounter for therapeutic drug level monitoring: Secondary | ICD-10-CM | POA: Diagnosis not present

## 2018-08-31 DIAGNOSIS — K5900 Constipation, unspecified: Secondary | ICD-10-CM | POA: Insufficient documentation

## 2018-08-31 DIAGNOSIS — M79641 Pain in right hand: Secondary | ICD-10-CM

## 2018-08-31 DIAGNOSIS — J449 Chronic obstructive pulmonary disease, unspecified: Secondary | ICD-10-CM | POA: Insufficient documentation

## 2018-08-31 DIAGNOSIS — M542 Cervicalgia: Secondary | ICD-10-CM | POA: Diagnosis not present

## 2018-08-31 DIAGNOSIS — F419 Anxiety disorder, unspecified: Secondary | ICD-10-CM | POA: Insufficient documentation

## 2018-08-31 DIAGNOSIS — M25511 Pain in right shoulder: Secondary | ICD-10-CM

## 2018-08-31 DIAGNOSIS — M47817 Spondylosis without myelopathy or radiculopathy, lumbosacral region: Secondary | ICD-10-CM | POA: Diagnosis not present

## 2018-08-31 DIAGNOSIS — I10 Essential (primary) hypertension: Secondary | ICD-10-CM | POA: Diagnosis not present

## 2018-08-31 DIAGNOSIS — M19041 Primary osteoarthritis, right hand: Secondary | ICD-10-CM | POA: Diagnosis not present

## 2018-08-31 DIAGNOSIS — M25552 Pain in left hip: Secondary | ICD-10-CM | POA: Insufficient documentation

## 2018-08-31 DIAGNOSIS — F319 Bipolar disorder, unspecified: Secondary | ICD-10-CM | POA: Insufficient documentation

## 2018-08-31 DIAGNOSIS — M19042 Primary osteoarthritis, left hand: Secondary | ICD-10-CM | POA: Diagnosis not present

## 2018-08-31 DIAGNOSIS — M62838 Other muscle spasm: Secondary | ICD-10-CM | POA: Insufficient documentation

## 2018-08-31 DIAGNOSIS — M170 Bilateral primary osteoarthritis of knee: Secondary | ICD-10-CM | POA: Diagnosis not present

## 2018-08-31 DIAGNOSIS — F17210 Nicotine dependence, cigarettes, uncomplicated: Secondary | ICD-10-CM | POA: Diagnosis not present

## 2018-08-31 DIAGNOSIS — M25512 Pain in left shoulder: Secondary | ICD-10-CM

## 2018-08-31 DIAGNOSIS — G8929 Other chronic pain: Secondary | ICD-10-CM | POA: Diagnosis present

## 2018-08-31 DIAGNOSIS — M5412 Radiculopathy, cervical region: Secondary | ICD-10-CM

## 2018-08-31 DIAGNOSIS — E669 Obesity, unspecified: Secondary | ICD-10-CM | POA: Insufficient documentation

## 2018-08-31 DIAGNOSIS — M25551 Pain in right hip: Secondary | ICD-10-CM | POA: Insufficient documentation

## 2018-08-31 DIAGNOSIS — M2550 Pain in unspecified joint: Secondary | ICD-10-CM

## 2018-08-31 DIAGNOSIS — M79642 Pain in left hand: Secondary | ICD-10-CM

## 2018-08-31 MED ORDER — OXYCODONE HCL 15 MG PO TABS: 15.0000 mg | ORAL_TABLET | Freq: Three times a day (TID) | ORAL | 0 refills | Status: DC | PRN

## 2018-08-31 NOTE — Progress Notes (Signed)
Subjective:    Patient ID: [name], female    DOB: September 07, 1966, 52 y.o.   MRN: [id number]  HPI: [name] is a 52 year old female who returns for follow-up appointment for chronic pain and medication refill.  She states her pain is located in her neck radiating into her bilateral shoulders right greater than left, lower back and bilateral hand pain.  She rates her pain 5.  Her current exercise regimen is walking.  [name] morphine equivalent is 67.50 MME. She is also prescribed alprazolam by Dr. Tammi Klippel.  We have discussed the black box warning of using opioids and benzodiazepines.  I highlighted the dangers of using these drugs together and discuss the adverse events including respiratory suppression, overdose, cognitive impairment and importance of compliance with current regiment.  We will continue to monitor and adjust as indicated.  Last UDS was performed on 08/03/2018, see note for detail. UDS ordered for today.  Pain Inventory Average Pain 6 Pain Right Now 5 My pain is sharp, burning, stabbing and tingling  In the last 24 hours, has pain interfered with the following? General activity 5 Relation with others 7 Enjoyment of life 7 What TIME of day is your pain at its worst? evening Sleep (in general) Fair  Pain is worse with: none Pain improves with: rest and heat/ice Relief from Meds: 7  Mobility walk without assistance ability to climb steps?  yes do you drive?  yes  Function employed # of hrs/week 28  Neuro/Psych bladder control problems  Prior Studies Any changes since last visit?  no  Physicians involved in your care Any changes since last visit?  yes   Family History  Problem Relation Age of Onset  . Schizophrenia Mother   . Hypertension Mother   . Alzheimer's disease Mother   . Stroke Father   . Diabetes Other   . Hypertension Other    Social History   Socioeconomic History  . Marital status: Divorced    Spouse name: Not on  file  . Number of children: Not on file  . Years of education: Not on file  . Highest education level: Not on file  Occupational History  . Not on file  Social Needs  . Financial resource strain: Not on file  . Food insecurity:    Worry: Not on file    Inability: Not on file  . Transportation needs:    Medical: Not on file    Non-medical: Not on file  Tobacco Use  . Smoking status: Current Every Day Smoker    Packs/day: 1.00    Years: 36.00    Pack years: 36.00    Types: Cigarettes  . Smokeless tobacco: Never Used  Substance and Sexual Activity  . Alcohol use: Yes    Alcohol/week: 1.0 standard drinks    Types: 1 Shots of liquor per week    Comment: occasional  . Drug use: No    Types: Morphine, Oxycodone    Comment: goes to pain clinic  . Sexual activity: Not on file  Lifestyle  . Physical activity:    Days per week: Not on file    Minutes per session: Not on file  . Stress: Not on file  Relationships  . Social connections:    Talks on phone: Not on file    Gets together: Not on file    Attends religious service: Not on file    Active member of club or organization: Not on file  Attends meetings of clubs or organizations: Not on file    Relationship status: Not on file  Other Topics Concern  . Not on file  Social History Narrative  . Not on file   Past Surgical History:  Procedure Laterality Date  . ABDOMINAL HYSTERECTOMY  1998  . CARDIOVASCULAR STRESS TEST  03/2010   Lexiscan Myoview: EF 55%, mild breast attenuation, no ischemia or scar  . DILATION AND CURETTAGE OF UTERUS  1986   "lost a son"  . esophageal ulcer    . JOINT REPLACEMENT    . KNEE ARTHROSCOPY Right 2005  . TONSILLECTOMY    . TOTAL KNEE ARTHROPLASTY Left 03/04/2016   Procedure: TOTAL KNEE ARTHROPLASTY;  Surgeon: Marchia Bond, MD;  Location: [place];  Service: Orthopedics;  Laterality: Left;  . TOTAL KNEE ARTHROPLASTY Right 07/15/2016   Procedure: TOTAL KNEE ARTHROPLASTY;  Surgeon: Marchia Bond, MD;  Location: [place];  Service: Orthopedics;  Laterality: Right;  . TOTAL SHOULDER ARTHROPLASTY Left 07/17/2015   Procedure: TOTAL SHOULDER ARTHROPLASTY;  Surgeon: Marchia Bond, MD;  Location: [place];  Service: Orthopedics;  Laterality: Left;  . TRANSTHORACIC ECHOCARDIOGRAM  03/2010   EF 50-55%, normal LV size, no WMAs  . TUBAL LIGATION  1990   Past Medical History:  Diagnosis Date  . Anemia   . Anxiety   . Bipolar 1 disorder ([place])   . 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 reports that she is cured   . COPD (chronic obstructive pulmonary disease) ([place])   . Depression   . Fibromyalgia   . GERD (gastroesophageal reflux disease)   . Headache    migraine's in 20's  . Hypertension   . Insomnia   . Obesity   . Osteoarthritis    knees, elbows   . 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 Pneumonia    developed in 07/2015- post shoulder replacement   . 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 Primary localized osteoarthritis of left knee 03/04/2016  . Primary localized osteoarthritis of right knee 07/15/2016  . PUD (peptic ulcer disease)   . Spinal stenosis    BP 130/83   Pulse (!) 108   Ht 5\' 2"  (1.575 m)   Wt 276 lb 12.8 oz (125.6 kg)   SpO2 96%   BMI 50.63 kg/m   Opioid Risk Score:   Fall Risk Score:  `1  Depression screen PHQ 2/9  Depression screen [facility] 2/9 08/31/2018 07/08/2018 06/07/2018 05/05/2018 03/04/2018 10/28/2017 09/01/2017  Decreased Interest 0 0 1 0 1 1 0  Down, Depressed, Hopeless 0 0 1 0 1 1 0  PHQ - 2 Score 0 0 2 0 2 2 0  Altered sleeping - - - - - - -  Tired, decreased energy - - - - - - -  Change in appetite - - - - - - -  Feeling bad or failure about yourself  - - - - - - -  Trouble concentrating - - - - - - -  Moving slowly or fidgety/restless - - - - - - -  Suicidal thoughts - - - - - - -  PHQ-9 Score - - - - - - -  Difficult doing work/chores - - - - - - -   Review of Systems  Constitutional:  Negative.   HENT: Negative.   Eyes: Negative.   Respiratory: Negative.   Cardiovascular: Negative.   Gastrointestinal: Negative.   Endocrine: Negative.   Genitourinary:  Negative.   Musculoskeletal: Negative.   Skin: Negative.   Allergic/Immunologic: Negative.   Neurological: Negative.   Hematological: Negative.   Psychiatric/Behavioral: Negative.   All other systems reviewed and are negative.      Objective:   Physical Exam  Constitutional: She is oriented to person, place, and time.  HENT:  Head: Normocephalic and atraumatic.  Neck: Normal range of motion. Neck supple.  Cardiovascular: Normal rate and regular rhythm.  Pulmonary/Chest: Effort normal and breath sounds normal.  Musculoskeletal:  Normal Muscle Bulk and Muscle Testing Reveals: Upper Extremities: Full ROM and Muscle Strength 5/5 Bilateral AC joint tenderness Lumbar Paraspinal Tenderness:L-3-L-5 Lower extremities: Full ROM and Muscle Strength 5/5 Arises from the table with ease Narrow base gait   Neurological: She is alert and oriented to person, place, and time.  Skin: Skin is warm and dry.  Psychiatric: She has a normal mood and affect. Her behavior is normal.  Nursing note and vitals reviewed.         Assessment & Plan:  1. Fibromyalgia Syndrome: Continue with exercise and heat therapy. Encouraged to increase activity as tolerated. 08/31/2018 2. Osteoarthritis of Bilateral Knees: Continue Voltaren Gel./ Lidoderm Patches 08/31/2018. Orthopedist Following: Continue with heat and exercise therapy. S/P Left Knee Arthroplasty on 03/04/16 and S/P Right Knee Arthroplasty on 07/15/2016 by Dr. Mardelle Matte. 3. Left Shoulder OA: S/PLeft Shoulder Arthroplasty. Orthopedist Following. Dr. Mardelle Matte. 08/31/2018 4. Chronic Bilateral  Shoulder Pain: Continue HEP as Tolerated. Continue to Monitor. 08/31/2018. 5. Bipolar Syndrome: On Depakote and Cymbalta. PCP Following. 08/31/2018 6. Lumbar Spondylosis: Refilled: Oxycodone  15 mg one tablet three times a day #90. 08/31/2018. We will continue the opioid monitoring program, this consists of regular clinic visits, examinations, urine drug screen, pill counts as well as use of [place] Controlled Substance Reporting System. 7. Muscle Spasms: Continuecurrent medication regimen withBacolen TID as needed.08/31/2018 8. Constipation: Continue Senna.08/31/2018 9. Peripheral Neuropathy: Continue: Lidocaine Patches.08/31/2018 10.Bilateral OA of Both Hands/ Right Elbow/ Poly Arthragia: Continue: Voltaren Gel. 08/31/2018 11. Bilateral Greater Trochanter Bursitis:No complaints Today.Continue to Alternate Ice and Heat Therapy: Continue to Monitor. 08/31/2018.  13. Cervicalgia/ Cervical Radiculitis: Continue HEP as Tolerated and Alternate Ice and Heat Therapy. 08/31/2018 14. Poly Arthralgia: Continue current medication regimen and Alternate Heat and Ice Therapy. 08/31/2018.  20 minutes of face to face patient care time was spent during this visit. All questions were encouraged and answered.   F/U in 1 month

## 2018-09-04 LAB — TOXASSURE SELECT,+ANTIDEPR,UR

## 2018-09-07 ENCOUNTER — Telehealth: Payer: Self-pay | Admitting: *Deleted

## 2018-09-07 DIAGNOSIS — M170 Bilateral primary osteoarthritis of knee: Secondary | ICD-10-CM

## 2018-09-07 DIAGNOSIS — M47817 Spondylosis without myelopathy or radiculopathy, lumbosacral region: Secondary | ICD-10-CM

## 2018-09-07 NOTE — Telephone Encounter (Signed)
Urine drug screen is inconsistent.  This is a repeat drug screen from last month since her oxycodone was missing from the urine in October. It is missing this month as well.  Her alprazolam is present both months in small amounts.  Both October and November tests she has had medication at her visits but reports she took last dose 3 days prior. (no reasons for this given). This is an aberrancy since she has been on oxycodone long term and therefore would not be expected to tolerate missing 3 days of medications without some form of withdrawal.

## 2018-09-13 NOTE — Telephone Encounter (Signed)
She is non-narcotic. We can provide a 'taper' if needed.

## 2018-09-14 NOTE — Telephone Encounter (Addendum)
I spoke with [name].  Per PMP her last Rx filled 09/05/18 and next appt is 09/28/18 with [name].  This appt was  canceled. She didn't really have an explanation except once, her daughter had surgery and she didn't take it in all the excitement". I explained to her that we repeated the test after the first negative and it was negative again even though she had pills at her visit. She did not say she wanted a taper dose, even when given a choice of taper off and non narcotic treatment or seek another physician. Her response was "I guess I will talk to [name] when I see him".  There are no appts before her next refill due date of 10/04/18.  We have scheduled her with you for 10/20/17 so she will need the taper sent in to the [facility] on [address].

## 2018-09-15 MED ORDER — OXYCODONE HCL 15 MG PO TABS: 15.0000 mg | ORAL_TABLET | ORAL | 0 refills | Status: DC

## 2018-09-15 NOTE — Telephone Encounter (Signed)
Taper written and sent to pharmacy

## 2018-09-16 ENCOUNTER — Telehealth: Payer: Self-pay | Admitting: *Deleted

## 2018-09-16 MED ORDER — ALPRAZOLAM 1 MG PO TABS: 1.0000 mg | ORAL_TABLET | Freq: Three times a day (TID) | ORAL | 1 refills | Status: AC

## 2018-09-16 NOTE — Telephone Encounter (Signed)
[name] will refill her xanax and she can discuss further with [name] in January.  I have notified Neema that xanax refilled and the taper dose of oxycodone was sent in by [name].

## 2018-09-16 NOTE — Telephone Encounter (Signed)
[name] called because we have been prescribing her xanax.  She is asking for a refill on her xanax.  According to PMP Dr Tammi Klippel was prescribing and the last refill was done by Dr Mellody Drown

## 2018-09-16 NOTE — Telephone Encounter (Signed)
Dr Tammi Klippel was prescribing [name] Alprazolam. She has a new PCP and reports they don't prescribe alprazolam.  PMP Aware Site was reviewed last prescription was filled on 08/30/2018. We will prescribe the Alprazolam and will address decreasing dose on her next schedule visit.

## 2018-09-28 ENCOUNTER — Ambulatory Visit: Payer: Medicare HMO | Admitting: Registered Nurse

## 2018-10-11 ENCOUNTER — Telehealth: Payer: Self-pay

## 2018-10-11 NOTE — Telephone Encounter (Signed)
Pt called stating she called [facility] on [address] for withdrawal medication and they didn't know what she was talking about.  -From telephone message 09/16/18 I have notified Arayna that xanax refilled and the taper dose of oxycodone was sent in by [name].

## 2018-10-11 NOTE — Telephone Encounter (Signed)
Per [name] pt was not to get a prescription for withdrawals only a taper of her Oxycodone. Tried calling pt to inform but no answer.

## 2018-10-12 NOTE — Telephone Encounter (Signed)
I called pt back today to let her no [name] only sent taper of Oxycodone, no withdrawal medication. She states the pharmacy stated they didn't have a prescription. I called [facility] on [address] and yes they have prescription and will fill it for her. I informed pt.

## 2018-10-20 ENCOUNTER — Encounter: Payer: Medicare HMO | Attending: Physical Medicine and Rehabilitation | Admitting: Physical Medicine & Rehabilitation

## 2018-10-20 ENCOUNTER — Encounter: Payer: Self-pay | Admitting: Physical Medicine & Rehabilitation

## 2018-10-20 VITALS — BP 113/77 | HR 93 | Ht 62.0 in | Wt 275.0 lb

## 2018-10-20 DIAGNOSIS — Z5181 Encounter for therapeutic drug level monitoring: Secondary | ICD-10-CM | POA: Insufficient documentation

## 2018-10-20 DIAGNOSIS — M797 Fibromyalgia: Secondary | ICD-10-CM | POA: Diagnosis not present

## 2018-10-20 DIAGNOSIS — F17210 Nicotine dependence, cigarettes, uncomplicated: Secondary | ICD-10-CM | POA: Diagnosis not present

## 2018-10-20 DIAGNOSIS — M47817 Spondylosis without myelopathy or radiculopathy, lumbosacral region: Secondary | ICD-10-CM | POA: Diagnosis not present

## 2018-10-20 DIAGNOSIS — F419 Anxiety disorder, unspecified: Secondary | ICD-10-CM | POA: Insufficient documentation

## 2018-10-20 DIAGNOSIS — G4700 Insomnia, unspecified: Secondary | ICD-10-CM | POA: Insufficient documentation

## 2018-10-20 DIAGNOSIS — Z79899 Other long term (current) drug therapy: Secondary | ICD-10-CM | POA: Diagnosis not present

## 2018-10-20 DIAGNOSIS — G8929 Other chronic pain: Secondary | ICD-10-CM | POA: Diagnosis not present

## 2018-10-20 DIAGNOSIS — M62838 Other muscle spasm: Secondary | ICD-10-CM | POA: Insufficient documentation

## 2018-10-20 DIAGNOSIS — M25551 Pain in right hip: Secondary | ICD-10-CM | POA: Diagnosis not present

## 2018-10-20 DIAGNOSIS — M47816 Spondylosis without myelopathy or radiculopathy, lumbar region: Secondary | ICD-10-CM

## 2018-10-20 DIAGNOSIS — K5900 Constipation, unspecified: Secondary | ICD-10-CM | POA: Insufficient documentation

## 2018-10-20 DIAGNOSIS — Z760 Encounter for issue of repeat prescription: Secondary | ICD-10-CM | POA: Diagnosis not present

## 2018-10-20 DIAGNOSIS — F319 Bipolar disorder, unspecified: Secondary | ICD-10-CM | POA: Diagnosis not present

## 2018-10-20 DIAGNOSIS — M25552 Pain in left hip: Secondary | ICD-10-CM | POA: Diagnosis not present

## 2018-10-20 DIAGNOSIS — I10 Essential (primary) hypertension: Secondary | ICD-10-CM | POA: Diagnosis not present

## 2018-10-20 DIAGNOSIS — M19042 Primary osteoarthritis, left hand: Secondary | ICD-10-CM | POA: Insufficient documentation

## 2018-10-20 DIAGNOSIS — M19041 Primary osteoarthritis, right hand: Secondary | ICD-10-CM | POA: Diagnosis not present

## 2018-10-20 DIAGNOSIS — E669 Obesity, unspecified: Secondary | ICD-10-CM | POA: Diagnosis not present

## 2018-10-20 DIAGNOSIS — J449 Chronic obstructive pulmonary disease, unspecified: Secondary | ICD-10-CM | POA: Insufficient documentation

## 2018-10-20 DIAGNOSIS — G629 Polyneuropathy, unspecified: Secondary | ICD-10-CM | POA: Insufficient documentation

## 2018-10-20 DIAGNOSIS — M170 Bilateral primary osteoarthritis of knee: Secondary | ICD-10-CM | POA: Diagnosis not present

## 2018-10-20 DIAGNOSIS — M19012 Primary osteoarthritis, left shoulder: Secondary | ICD-10-CM | POA: Insufficient documentation

## 2018-10-20 MED ORDER — BACLOFEN 10 MG PO TABS: 10.0000 mg | ORAL_TABLET | Freq: Three times a day (TID) | ORAL | 2 refills | Status: AC | PRN

## 2018-10-20 MED ORDER — EPINEPHRINE 0.3 MG/0.3ML IJ SOAJ: 0.3000 mg | Freq: Once | INTRAMUSCULAR | 3 refills | Status: AC

## 2018-10-20 MED ORDER — DICLOFENAC SODIUM 75 MG PO TBEC: 75.0000 mg | DELAYED_RELEASE_TABLET | Freq: Two times a day (BID) | ORAL | 3 refills | Status: DC

## 2018-10-20 NOTE — Patient Instructions (Signed)
PLEASE FEEL FREE TO CALL OUR OFFICE WITH ANY PROBLEMS OR QUESTIONS ([phone])      

## 2018-10-20 NOTE — Progress Notes (Signed)
Patient brought bottle of oxycodone 15mg  today to be discontinued by provider.  10-12-2018    D#42    V#33    Last dose last PM
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
## 2018-10-27 ENCOUNTER — Ambulatory Visit
Admission: RE | Admit: 2018-10-27 | Discharge: 2018-10-27 | Disposition: A | Discharge status: Home / Self Care | Payer: Medicare HMO | Source: Ambulatory Visit | Attending: Physical Medicine & Rehabilitation | Admitting: Physical Medicine & Rehabilitation

## 2018-10-27 DIAGNOSIS — M47816 Spondylosis without myelopathy or radiculopathy, lumbar region: Secondary | ICD-10-CM

## 2018-10-27 DIAGNOSIS — M170 Bilateral primary osteoarthritis of knee: Secondary | ICD-10-CM

## 2018-10-29 ENCOUNTER — Telehealth (HOSPITAL_COMMUNITY): Payer: Self-pay | Admitting: Physical Medicine & Rehabilitation

## 2018-10-29 NOTE — Telephone Encounter (Signed)
  Reviewed xrays of low back, compared them to MRI from 2012.   Grade 1 anterolisthesis of L4-5 is noted secondary to posterior facet joint hypertrophy. Severe degenerative disc disease is noted at L4-5. No fracture is noted. Remaining disc spaces are Unremarkable.  Suspect facets are driving pain although disc disease is severe. Continue with current plan. Will discuss follow up plan at next OV

## 2018-11-01 ENCOUNTER — Telehealth: Payer: Self-pay

## 2018-11-01 NOTE — Telephone Encounter (Signed)
See below:   10/29/18 4:18 PM  Note     Reviewed xrays of low back, compared them to MRI from 2012.   Grade 1 anterolisthesis of L4-5 is noted secondary to posterior facet joint hypertrophy. Severe degenerative disc disease is noted at L4-5. No fracture is noted. Remaining disc spaces are Unremarkable.  Suspect facets are driving pain although disc disease is severe. Continue with current plan. Will discuss follow up plan at next OV

## 2018-11-01 NOTE — Telephone Encounter (Signed)
Pt called requesting xray results. Last completed [facility].

## 2018-11-02 NOTE — Telephone Encounter (Signed)
Called mrs Ohair back and relayed results of xrays to her

## 2018-11-02 NOTE — Telephone Encounter (Signed)
Called pt and had to leave message to call back because no recent DPR

## 2018-11-23 ENCOUNTER — Encounter: Payer: Medicare HMO | Attending: Physical Medicine and Rehabilitation | Admitting: Physical Medicine & Rehabilitation

## 2018-11-23 DIAGNOSIS — Z760 Encounter for issue of repeat prescription: Secondary | ICD-10-CM | POA: Insufficient documentation

## 2018-11-23 DIAGNOSIS — M19041 Primary osteoarthritis, right hand: Secondary | ICD-10-CM | POA: Insufficient documentation

## 2018-11-23 DIAGNOSIS — G629 Polyneuropathy, unspecified: Secondary | ICD-10-CM | POA: Insufficient documentation

## 2018-11-23 DIAGNOSIS — Z79899 Other long term (current) drug therapy: Secondary | ICD-10-CM | POA: Insufficient documentation

## 2018-11-23 DIAGNOSIS — M62838 Other muscle spasm: Secondary | ICD-10-CM | POA: Insufficient documentation

## 2018-11-23 DIAGNOSIS — M25551 Pain in right hip: Secondary | ICD-10-CM | POA: Insufficient documentation

## 2018-11-23 DIAGNOSIS — G8929 Other chronic pain: Secondary | ICD-10-CM | POA: Insufficient documentation

## 2018-11-23 DIAGNOSIS — E669 Obesity, unspecified: Secondary | ICD-10-CM | POA: Insufficient documentation

## 2018-11-23 DIAGNOSIS — M170 Bilateral primary osteoarthritis of knee: Secondary | ICD-10-CM | POA: Insufficient documentation

## 2018-11-23 DIAGNOSIS — K5900 Constipation, unspecified: Secondary | ICD-10-CM | POA: Insufficient documentation

## 2018-11-23 DIAGNOSIS — Z5181 Encounter for therapeutic drug level monitoring: Secondary | ICD-10-CM | POA: Insufficient documentation

## 2018-11-23 DIAGNOSIS — F17210 Nicotine dependence, cigarettes, uncomplicated: Secondary | ICD-10-CM | POA: Insufficient documentation

## 2018-11-23 DIAGNOSIS — M19012 Primary osteoarthritis, left shoulder: Secondary | ICD-10-CM | POA: Insufficient documentation

## 2018-11-23 DIAGNOSIS — F319 Bipolar disorder, unspecified: Secondary | ICD-10-CM | POA: Insufficient documentation

## 2018-11-23 DIAGNOSIS — J449 Chronic obstructive pulmonary disease, unspecified: Secondary | ICD-10-CM | POA: Insufficient documentation

## 2018-11-23 DIAGNOSIS — M19042 Primary osteoarthritis, left hand: Secondary | ICD-10-CM | POA: Insufficient documentation

## 2018-11-23 DIAGNOSIS — G4700 Insomnia, unspecified: Secondary | ICD-10-CM | POA: Insufficient documentation

## 2018-11-23 DIAGNOSIS — I10 Essential (primary) hypertension: Secondary | ICD-10-CM | POA: Insufficient documentation

## 2018-11-23 DIAGNOSIS — F419 Anxiety disorder, unspecified: Secondary | ICD-10-CM | POA: Insufficient documentation

## 2018-11-23 DIAGNOSIS — M47817 Spondylosis without myelopathy or radiculopathy, lumbosacral region: Secondary | ICD-10-CM | POA: Insufficient documentation

## 2018-11-23 DIAGNOSIS — M25552 Pain in left hip: Secondary | ICD-10-CM | POA: Insufficient documentation

## 2018-11-23 DIAGNOSIS — M797 Fibromyalgia: Secondary | ICD-10-CM | POA: Insufficient documentation

## 2018-11-25 ENCOUNTER — Telehealth: Payer: Self-pay | Admitting: *Deleted

## 2018-11-25 NOTE — Telephone Encounter (Signed)
No showed for 11/23/18 appt. Letter mailed for discharge from practice.

## 2018-12-26 ENCOUNTER — Other Ambulatory Visit: Payer: Self-pay | Admitting: Registered Nurse

## 2020-04-10 ENCOUNTER — Emergency Department
Admission: EM | Admit: 2020-04-10 | Discharge: 2020-04-10 | Disposition: A | Discharge status: Home / Self Care | Payer: Medicare HMO | Attending: Student in an Organized Health Care Education/Training Program | Admitting: Student in an Organized Health Care Education/Training Program

## 2020-04-10 ENCOUNTER — Other Ambulatory Visit: Payer: Self-pay

## 2020-04-10 ENCOUNTER — Encounter: Payer: Self-pay | Admitting: Intensive Care

## 2020-04-10 DIAGNOSIS — Z96653 Presence of artificial knee joint, bilateral: Secondary | ICD-10-CM | POA: Diagnosis not present

## 2020-04-10 DIAGNOSIS — Z96612 Presence of left artificial shoulder joint: Secondary | ICD-10-CM | POA: Insufficient documentation

## 2020-04-10 DIAGNOSIS — Z79899 Other long term (current) drug therapy: Secondary | ICD-10-CM | POA: Insufficient documentation

## 2020-04-10 DIAGNOSIS — W540XXA Bitten by dog, initial encounter: Secondary | ICD-10-CM | POA: Insufficient documentation

## 2020-04-10 DIAGNOSIS — F17210 Nicotine dependence, cigarettes, uncomplicated: Secondary | ICD-10-CM | POA: Diagnosis not present

## 2020-04-10 DIAGNOSIS — J45909 Unspecified asthma, uncomplicated: Secondary | ICD-10-CM | POA: Insufficient documentation

## 2020-04-10 DIAGNOSIS — Y93K1 Activity, walking an animal: Secondary | ICD-10-CM | POA: Insufficient documentation

## 2020-04-10 DIAGNOSIS — Y929 Unspecified place or not applicable: Secondary | ICD-10-CM | POA: Diagnosis not present

## 2020-04-10 DIAGNOSIS — S01451A Open bite of right cheek and temporomandibular area, initial encounter: Secondary | ICD-10-CM | POA: Insufficient documentation

## 2020-04-10 DIAGNOSIS — Z791 Long term (current) use of non-steroidal anti-inflammatories (NSAID): Secondary | ICD-10-CM | POA: Insufficient documentation

## 2020-04-10 DIAGNOSIS — J449 Chronic obstructive pulmonary disease, unspecified: Secondary | ICD-10-CM | POA: Diagnosis not present

## 2020-04-10 DIAGNOSIS — Y999 Unspecified external cause status: Secondary | ICD-10-CM | POA: Diagnosis not present

## 2020-04-10 LAB — GLUCOSE, CAPILLARY: Glucose-Capillary: 89 mg/dL (ref 70–99)

## 2020-04-10 MED ORDER — AMOXICILLIN-POT CLAVULANATE 875-125 MG PO TABS: 1.0000 | ORAL_TABLET | Freq: Two times a day (BID) | ORAL | 0 refills | Status: AC

## 2020-04-10 MED ORDER — TETANUS-DIPHTH-ACELL PERTUSSIS 5-2.5-18.5 LF-MCG/0.5 IM SUSP
0.5000 mL | Freq: Once | INTRAMUSCULAR | Status: AC
  Administered 2020-04-10: 0.5 mL via INTRAMUSCULAR
  Filled 2020-04-10: qty 0.5

## 2020-04-10 NOTE — Discharge Instructions (Addendum)
Follow-up with your regular doctor if any sign of infection. Return to the emergency department if animal control determines that the dog has rabies. Your tetanus shot is good for 10 years unless you get hurt that it reduces to 5. Take the Augmentin to prevent infection. Continue to take all your regular medications.

## 2020-04-10 NOTE — ED Notes (Signed)
Pt has dog bite to right side of face. Pt reports it was a grooming dog. States she has already reported the incident.

## 2020-04-10 NOTE — ED Provider Notes (Signed)
[facility] Emergency Department Provider Note  ____________________________________________   First MD Initiated Contact with Patient 04/10/20 1225     (approximate)  I have reviewed the triage vital signs and the nursing notes.   HISTORY  Chief Complaint Animal Bite    HPI [name] is a 54 y.o. female presents emergency department with a dog bite to the right side of face.  Patient states that she was grooming a Chow.  The dog is elderly and has had problems along with having a mass on his neck and had already nipped at her arm while he was in discomfort, then she states he bit her face.  States his immunizations just recently ran out.  They have notified the owners.  Animal control has been notified.  Patient is unsure of her last tetanus.  No other injuries are reported at this time.    Past Medical History:  Diagnosis Date  . Anemia   . Anxiety   . Bipolar 1 disorder ([place])   . 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 reports that she is cured   . COPD (chronic obstructive pulmonary disease) ([place])   . Depression   . Fibromyalgia   . GERD (gastroesophageal reflux disease)   . Headache    migraine's in 20's  . Hypertension   . Insomnia   . Obesity   . Osteoarthritis    knees, elbows   . 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 Pneumonia    developed in 07/2015- post shoulder replacement   . 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 Primary localized osteoarthritis of left knee 03/04/2016  . Primary localized osteoarthritis of right knee 07/15/2016  . PUD (peptic ulcer disease)   . Spinal stenosis     Patient Active Problem List   Diagnosis Date Noted  . Fibromyalgia 10/20/2018  . Biceps tendonitis on right 02/11/2017  . Primary localized osteoarthritis of right knee 07/15/2016  . Postoperative anemia due to acute blood loss 03/07/2016  . possible malignant hyperthermia variant post op  03/07/2016  . Sinus tachycardia 03/05/2016  . GERD (gastroesophageal reflux disease) 03/05/2016  . SVT (supraventricular tachycardia) ([place])   . Atelectasis   . Absolute anemia   . Arterial hypotension   . Pulmonary emphysema ([place])   . Primary localized osteoarthritis of left knee 03/04/2016  . S/P total knee arthroplasty 03/04/2016  . Sepsis ([place]) 07/19/2015  . Tobacco abuse 07/19/2015  . COPD (chronic obstructive pulmonary disease) ([place]) 07/19/2015  . S/P shoulder replacement   . Tachycardia   . Tremor of both hands 02/28/2015  . Lumbar facet arthropathy 02/28/2015  . Chest pain at rest 12/25/2013  . Chronic pain disorder 12/25/2013  . Chest pain due to psychological stress 12/25/2013  . Insomnia disorder 10/04/2013  . 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 Unspecified hereditary and idiopathic peripheral neuropathy 05/10/2013  . Lumbosacral spondylosis without myelopathy 05/10/2013  . Osteoarthritis of both knees 05/10/2013  . Left rotator cuff tear arthropathy 05/10/2013  . RESTLESS LEG SYNDROME 05/26/2009  . HYPERSOMNIA, ASSOCIATED WITH SLEEP APNEA 03/28/2009  . Anxiety state 02/23/2009  . Depression 02/23/2009  . ARTHRITIS 02/23/2009  . DYSPHAGIA UNSPECIFIED 02/23/2009  . CERVICAL CANCER, HX OF 02/23/2009  . Bipolar disorder ([place]) 02/21/2009  . GERD 02/21/2009  . IRRITABLE BOWEL SYNDROME 02/21/2009  . NECK PAIN 02/21/2009  . Myalgia and myositis 02/21/2009    Past Surgical History:  Procedure Laterality Date  .  ABDOMINAL HYSTERECTOMY  1998  . CARDIOVASCULAR STRESS TEST  03/2010   Lexiscan Myoview: EF 55%, mild breast attenuation, no ischemia or scar  . DILATION AND CURETTAGE OF UTERUS  1986   "lost a son"  . esophageal ulcer    . JOINT REPLACEMENT    . KNEE ARTHROSCOPY Right 2005  . TONSILLECTOMY    . TOTAL KNEE ARTHROPLASTY Left 03/04/2016   Procedure: TOTAL KNEE ARTHROPLASTY;  Surgeon: Marchia Bond, MD;  Location: [facility];  Service: Orthopedics;   Laterality: Left;  . TOTAL KNEE ARTHROPLASTY Right 07/15/2016   Procedure: TOTAL KNEE ARTHROPLASTY;  Surgeon: Marchia Bond, MD;  Location: [facility];  Service: Orthopedics;  Laterality: Right;  . TOTAL SHOULDER ARTHROPLASTY Left 07/17/2015   Procedure: TOTAL SHOULDER ARTHROPLASTY;  Surgeon: Marchia Bond, MD;  Location: [facility];  Service: Orthopedics;  Laterality: Left;  . TRANSTHORACIC ECHOCARDIOGRAM  03/2010   EF 50-55%, normal LV size, no WMAs  . TUBAL LIGATION  1990    Prior to Admission medications   Medication Sig Start Date End Date Taking? Authorizing Provider  ALPRAZolam Duanne Moron) 1 MG tablet Take 1 tablet (1 mg total) by mouth 3 (three) times daily. 09/16/18   Bayard Hugger, NP  amoxicillin-clavulanate (AUGMENTIN) 875-125 MG tablet Take 1 tablet by mouth 2 (two) times daily for 7 days. 04/10/20 04/17/20  Jamell Laymon, Linden Dolin, PA-C  baclofen (LIORESAL) 10 MG tablet Take 1 tablet (10 mg total) by mouth 3 (three) times daily as needed for muscle spasms. 10/20/18   Meredith Staggers, MD  diclofenac (VOLTAREN) 75 MG EC tablet Take 1 tablet (75 mg total) by mouth 2 (two) times daily with a meal. 10/20/18   Meredith Staggers, MD  diclofenac sodium (VOLTAREN) 1 % GEL Apply 2 g topically 3 (three) times daily. Use as directed 12/12/16   Bayard Hugger, NP  divalproex (DEPAKOTE ER) 500 MG 24 hr tablet Take 1,000 mg by mouth every evening.     [provider]  DULoxetine (CYMBALTA) 60 MG capsule Take 1 capsule (60 mg total) by mouth daily. 08/03/18   Bayard Hugger, NP  hydrochlorothiazide (HYDRODIURIL) 12.5 MG tablet Take 12.5 mg by mouth daily.  06/19/15   [provider]  lidocaine (LIDODERM) 5 % PLACE 1 PATCH ONTO THE SKIN ONCE DAILY. REMOVE AFTER 12 HOURS AND LEAVE OFF UNTIL THE NEXT DAY. 09/30/17   Bayard Hugger, NP  Melatonin 1 MG TABS Take 1 mg by mouth at bedtime.    [provider]  nitroGLYCERIN (NITROSTAT) 0.4 MG SL tablet Place 0.4 mg under the tongue every 5 (five)  minutes as needed for chest pain.  06/11/15   [provider]  polyethylene glycol (MIRALAX / GLYCOLAX) packet Take 17 g by mouth daily as needed for mild constipation.    [provider]  sennosides-docusate sodium (SENOKOT-S) 8.6-50 MG tablet Take 2 tablets by mouth daily. 07/15/16   Marchia Bond, MD  TOVIAZ 8 MG TB24 tablet  02/09/17   [provider]    Allergies Aripiprazole, Bupropion, Lurasidone hcl, and Naproxen  Family History  Problem Relation Age of Onset  . Schizophrenia Mother   . Hypertension Mother   . Alzheimer's disease Mother   . Stroke Father   . Diabetes Other   . Hypertension Other     Social History Social History   Tobacco Use  . Smoking status: Current Every Day Smoker    Packs/day: 1.00    Years: 36.00  Pack years: 36.00    Types: E-cigarettes  . Smokeless tobacco: Never Used  Substance Use Topics  . Alcohol use: Yes    Alcohol/week: 1.0 standard drink    Types: 1 Shots of liquor per week    Comment: occasional  . Drug use: No    Types: Morphine, Oxycodone    Comment: goes to pain clinic    Review of Systems  Constitutional: No fever/chills Eyes: No visual changes. ENT: No sore throat. Respiratory: Denies cough Cardiovascular: Denies chest pain Gastrointestinal: Denies abdominal pain Genitourinary: Negative for dysuria. Musculoskeletal: Negative for back pain. Skin: Negative for rash.  Positive dog bite to the face Psychiatric: no mood changes,     ____________________________________________   PHYSICAL EXAM:  VITAL SIGNS: ED Triage Vitals  Enc Vitals Group     BP 04/10/20 1213 138/75     Pulse Rate 04/10/20 1213 82     Resp 04/10/20 1213 18     Temp 04/10/20 1213 99.4 F (37.4 C)     Temp Source 04/10/20 1213 Oral     SpO2 04/10/20 1213 96 %     Weight 04/10/20 1212 270 lb (122.5 kg)     Height 04/10/20 1212 5\' 2"  (1.575 m)     Head Circumference --      Peak Flow --      Pain Score 04/10/20  1215 4     Pain Loc --      Pain Edu? --      Excl. in [place]? --     Constitutional: Alert and oriented. Well appearing and in no acute distress. Eyes: Conjunctivae are normal.  Head: Face has a very superficial abrasion under the right eye, no active bleeding is noted, skin type avulsion above the right lip, area is superficial, teeth marks noted on the right jaw Nose: No congestion/rhinnorhea. Mouth/Throat: Mucous membranes are moist.   Neck:  supple no lymphadenopathy noted Cardiovascular: Normal rate, regular rhythm. Respiratory: Normal respiratory effort.  No retractions GU: deferred Musculoskeletal: FROM all extremities, warm and well perfused Neurologic:  Normal speech and language.  Skin:  Skin is warm, dry No rash noted. Psychiatric: Mood and affect are normal. Speech and behavior are normal.  ____________________________________________   LABS (all labs ordered are listed, but only abnormal results are displayed)  Labs Reviewed  CBG MONITORING, ED   ____________________________________________   ____________________________________________  RADIOLOGY    ____________________________________________   PROCEDURES  Procedure(s) performed:   Marland KitchenMarland KitchenLaceration Repair  Date/Time: 04/10/2020 4:01 PM Performed by: Versie Starks, PA-C Authorized by: Versie Starks, PA-C   Consent:    Consent obtained:  Verbal   Consent given by:  Patient   Risks discussed:  Infection, pain and poor cosmetic result Anesthesia (see MAR for exact dosages):    Anesthesia method:  None Laceration details:    Location:  Face   Face location:  R cheek   Length (cm):  2 Repair type:    Repair type:  Simple Exploration:    Hemostasis achieved with:  Direct pressure   Wound exploration: wound explored through full range of motion     Wound extent: no foreign bodies/material noted and no underlying fracture noted     Contaminated: no   Treatment:    Area cleansed with:  Saline    Amount of cleaning:  Standard   Irrigation solution:  Sterile saline   Irrigation method:  Tap Skin repair:    Repair method:  Tissue adhesive Approximation:  Approximation:  Close Post-procedure details:    Dressing:  Open (no dressing)   Patient tolerance of procedure:  Tolerated well, no immediate complications      ____________________________________________   INITIAL IMPRESSION / ASSESSMENT AND PLAN / ED COURSE  Pertinent labs & imaging results that were available during my care of the patient were reviewed by me and considered in my medical decision making (see chart for details).   Patient is 54 year old female presents emergency department after a dog bite while grooming a chow.  Patient states that the dog bit her face.  Immunizations of the dog have just expired.  Animal control notified.  Unsure of last tetanus.  Physical exam is consistent with a superficial dog bite.  Tdap updated in the ED, see procedure note for repair of the superficial laceration, patient was placed on Augmentin 875 twice daily for 7 days.  I instructed her that since the dog is known that the dog can be observed for 10 days.  If the dog shows any signs of rabies she should return emergency department for rabies vaccinations.  Explained to her that the are not needed at this time.  However if they are unable to obtain the dog for observation she may want to go ahead with rabies vaccinations.  She was discharged stable condition.    [name] was evaluated in Emergency Department on 04/10/2020 for the symptoms described in the history of present illness. She was evaluated in the context of the global COVID-19 pandemic, which necessitated consideration that the patient might be at risk for infection with the SARS-CoV-2 virus that causes COVID-19. Institutional protocols and algorithms that pertain to the evaluation of patients at risk for COVID-19 are in a state of rapid change based on information  released by regulatory bodies including the CDC and federal and state organizations. These policies and algorithms were followed during the patient's care in the ED.   As part of my medical decision making, I reviewed the following data within the [facility] notes reviewed and incorporated, Old chart reviewed, Notes from prior ED visits and [place] Controlled Substance Database  ____________________________________________   FINAL CLINICAL IMPRESSION(S) / ED DIAGNOSES  Final diagnoses:  Dog bite, initial encounter      NEW MEDICATIONS STARTED DURING THIS VISIT:  New Prescriptions   AMOXICILLIN-CLAVULANATE (AUGMENTIN) 875-125 MG TABLET    Take 1 tablet by mouth 2 (two) times daily for 7 days.     Note:  This document was prepared using Dragon voice recognition software and may include unintentional dictation errors.    Versie Starks, PA-C 04/10/20 1604    Merlyn Lot, MD 04/13/20 [phone]

## 2020-04-10 NOTE — ED Triage Notes (Signed)
Patient presents with dog bites to right side of face.

## 2020-08-01 ENCOUNTER — Other Ambulatory Visit: Payer: Self-pay | Admitting: Otolaryngology

## 2020-08-01 DIAGNOSIS — H918X9 Other specified hearing loss, unspecified ear: Secondary | ICD-10-CM

## 2020-08-23 ENCOUNTER — Ambulatory Visit
Admission: RE | Admit: 2020-08-23 | Discharge: 2020-08-23 | Disposition: A | Discharge status: Home / Self Care | Payer: Medicare HMO | Source: Ambulatory Visit | Attending: Otolaryngology | Admitting: Otolaryngology

## 2020-08-23 ENCOUNTER — Other Ambulatory Visit: Payer: Medicare HMO

## 2020-08-23 DIAGNOSIS — H918X9 Other specified hearing loss, unspecified ear: Secondary | ICD-10-CM

## 2020-08-23 MED ORDER — GADOBENATE DIMEGLUMINE 529 MG/ML IV SOLN
20.0000 mL | Freq: Once | INTRAVENOUS | Status: AC | PRN
  Administered 2020-08-23: 20 mL via INTRAVENOUS

## 2020-10-13 ENCOUNTER — Emergency Department
Admission: EM | Admit: 2020-10-13 | Discharge: 2020-10-13 | Disposition: A | Discharge status: Home / Self Care | Payer: Medicare HMO | Attending: Emergency Medicine | Admitting: Emergency Medicine

## 2020-10-13 ENCOUNTER — Other Ambulatory Visit: Payer: Self-pay

## 2020-10-13 DIAGNOSIS — Z96612 Presence of left artificial shoulder joint: Secondary | ICD-10-CM | POA: Insufficient documentation

## 2020-10-13 DIAGNOSIS — F17290 Nicotine dependence, other tobacco product, uncomplicated: Secondary | ICD-10-CM | POA: Diagnosis not present

## 2020-10-13 DIAGNOSIS — Z8542 Personal history of malignant neoplasm of other parts of uterus: Secondary | ICD-10-CM | POA: Diagnosis not present

## 2020-10-13 DIAGNOSIS — R32 Unspecified urinary incontinence: Secondary | ICD-10-CM

## 2020-10-13 DIAGNOSIS — Z79899 Other long term (current) drug therapy: Secondary | ICD-10-CM | POA: Diagnosis not present

## 2020-10-13 DIAGNOSIS — J449 Chronic obstructive pulmonary disease, unspecified: Secondary | ICD-10-CM | POA: Insufficient documentation

## 2020-10-13 DIAGNOSIS — I10 Essential (primary) hypertension: Secondary | ICD-10-CM | POA: Diagnosis not present

## 2020-10-13 DIAGNOSIS — Z96653 Presence of artificial knee joint, bilateral: Secondary | ICD-10-CM | POA: Insufficient documentation

## 2020-10-13 DIAGNOSIS — N3000 Acute cystitis without hematuria: Secondary | ICD-10-CM

## 2020-10-13 DIAGNOSIS — R300 Dysuria: Secondary | ICD-10-CM

## 2020-10-13 DIAGNOSIS — R109 Unspecified abdominal pain: Secondary | ICD-10-CM | POA: Diagnosis present

## 2020-10-13 LAB — BASIC METABOLIC PANEL
Anion gap: 11 (ref 5–15)
BUN: 17 mg/dL (ref 6–20)
CO2: 28 mmol/L (ref 22–32)
Calcium: 9.4 mg/dL (ref 8.9–10.3)
Chloride: 96 mmol/L — ABNORMAL LOW (ref 98–111)
Creatinine, Ser: 0.71 mg/dL (ref 0.44–1.00)
GFR, Estimated: >60 mL/min (ref >60)
Glucose, Bld: 116 mg/dL — ABNORMAL HIGH (ref 70–99)
Potassium: 3.8 mmol/L (ref 3.5–5.1)
Sodium: 135 mmol/L (ref 135–145)

## 2020-10-13 LAB — CBC
HCT: 30.7 % — ABNORMAL LOW (ref 36.0–46.0)
Hemoglobin: 9.9 g/dL — ABNORMAL LOW (ref 12.0–15.0)
MCH: 27.4 pg (ref 26.0–34.0)
MCHC: 32.2 g/dL (ref 30.0–36.0)
MCV: 85 fL (ref 80.0–100.0)
Platelets: 311 10*3/uL (ref 150–400)
RBC: 3.61 MIL/uL — ABNORMAL LOW (ref 3.87–5.11)
RDW: 13.9 % (ref 11.5–15.5)
WBC: 8.9 10*3/uL (ref 4.0–10.5)
nRBC: 0 % (ref 0.0–0.2)

## 2020-10-13 LAB — URINALYSIS, COMPLETE (UACMP) WITH MICROSCOPIC
Bilirubin Urine: NEGATIVE
Glucose, UA: NEGATIVE mg/dL
Hgb urine dipstick: NEGATIVE
Ketones, ur: NEGATIVE mg/dL
Nitrite: NEGATIVE
Protein, ur: NEGATIVE mg/dL
Specific Gravity, Urine: 1.011 (ref 1.005–1.030)
pH: 6 (ref 5.0–8.0)

## 2020-10-13 MED ORDER — CEFDINIR 300 MG PO CAPS: 300.0000 mg | ORAL_CAPSULE | Freq: Two times a day (BID) | ORAL | 0 refills | Status: AC

## 2020-10-13 NOTE — ED Notes (Signed)
Pt signed paper copy of discharge - signature pad in room not working

## 2020-10-13 NOTE — ED Triage Notes (Signed)
Pt comes pov with right sided flank pain and urinary symptoms for 3 days. Difficult to hold urine and pt has had to use bladder leak pads.

## 2020-10-13 NOTE — ED Provider Notes (Signed)
[facility] Emergency Department Provider Note   ____________________________________________   Event Date/Time   First MD Initiated Contact with Patient 10/13/20 1052     (approximate)  I have reviewed the triage vital signs and the nursing notes.   HISTORY  Chief Complaint Flank Pain    HPI [name] is a 55 y.o. female with stated past medical history of fibromyalgia, depression/anxiety, bipolar disorder, obesity, and hypertension who presents for right flank pain and dysuria that is been worsening over the past 3 days.  Patient describes aching, 5/10, nonradiating right flank pain that does not have any exacerbating or relieving factors.  Patient does state that she has had some urinary incontinence that is been worsening over the last 2 months.  Patient states that this is not new for her but it has been worsening since she has been having dysuria.  Patient states that she takes oxybutynin for the symptoms and has had to increase her dosage recently.  Patient does not follow with a urologist.  Patient currently denies any vision changes, tinnitus, difficulty speaking, facial droop, sore throat, chest pain, shortness of breath, nausea/vomiting/diarrhea, or weakness/numbness/paresthesias in any extremity         Past Medical History:  Diagnosis Date  . Anemia   . Anxiety   . Bipolar 1 disorder (HCC)   . 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 COPD (chronic obstructive pulmonary disease) (HCC)   . Depression   . Fibromyalgia   . GERD (gastroesophageal reflux disease)   . Headache    migraine's in 20's  . Hypertension   . Insomnia   . Obesity   . Osteoarthritis    knees, elbows   . 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 Pneumonia    developed in 07/2015- post shoulder replacement   . 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 Primary localized osteoarthritis of  left knee 03/04/2016  . Primary localized osteoarthritis of right knee 07/15/2016  . PUD (peptic ulcer disease)   . Spinal stenosis     Patient Active Problem List   Diagnosis Date Noted  . Fibromyalgia 10/20/2018  . Biceps tendonitis on right 02/11/2017  . Primary localized osteoarthritis of right knee 07/15/2016  . Postoperative anemia due to acute blood loss 03/07/2016  . 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 Sinus tachycardia 03/05/2016  . GERD (gastroesophageal reflux disease) 03/05/2016  . SVT (supraventricular tachycardia) (HCC)   . Atelectasis   . Absolute anemia   . Arterial hypotension   . Pulmonary emphysema (HCC)   . Primary localized osteoarthritis of left knee 03/04/2016  . S/P total knee arthroplasty 03/04/2016  . Sepsis (HCC) 07/19/2015  . Tobacco abuse 07/19/2015  . COPD (chronic obstructive pulmonary disease) (HCC) 07/19/2015  . S/P shoulder replacement   . Tachycardia   . Tremor of both hands 02/28/2015  . Lumbar facet arthropathy 02/28/2015  . Chest pain at rest 12/25/2013  . Chronic pain disorder 12/25/2013  . Chest pain due to psychological stress 12/25/2013  . Insomnia disorder 10/04/2013  . 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 Unspecified hereditary and idiopathic peripheral neuropathy 05/10/2013  . Lumbosacral spondylosis without myelopathy 05/10/2013  . Osteoarthritis of both knees 05/10/2013  . Left rotator cuff tear arthropathy 05/10/2013  . RESTLESS LEG SYNDROME 05/26/2009  . HYPERSOMNIA, ASSOCIATED WITH SLEEP APNEA 03/28/2009  . Anxiety state 02/23/2009  . Depression  02/23/2009  . ARTHRITIS 02/23/2009  . DYSPHAGIA UNSPECIFIED 02/23/2009  . CERVICAL CANCER, HX OF 02/23/2009  . Bipolar disorder ([place]) 02/21/2009  . GERD 02/21/2009  . IRRITABLE BOWEL SYNDROME 02/21/2009  . NECK PAIN 02/21/2009  . Myalgia and myositis 02/21/2009    Past Surgical History:  Procedure Laterality Date  . ABDOMINAL HYSTERECTOMY   1998  . CARDIOVASCULAR STRESS TEST  03/2010   Lexiscan Myoview: EF 55%, mild breast attenuation, no ischemia or scar  . DILATION AND CURETTAGE OF UTERUS  1986   "lost a son"  . esophageal ulcer    . JOINT REPLACEMENT    . KNEE ARTHROSCOPY Right 2005  . TONSILLECTOMY    . TOTAL KNEE ARTHROPLASTY Left 03/04/2016   Procedure: TOTAL KNEE ARTHROPLASTY;  Surgeon: Marchia Bond, MD;  Location: [place];  Service: Orthopedics;  Laterality: Left;  . TOTAL KNEE ARTHROPLASTY Right 07/15/2016   Procedure: TOTAL KNEE ARTHROPLASTY;  Surgeon: Marchia Bond, MD;  Location: [place];  Service: Orthopedics;  Laterality: Right;  . TOTAL SHOULDER ARTHROPLASTY Left 07/17/2015   Procedure: TOTAL SHOULDER ARTHROPLASTY;  Surgeon: Marchia Bond, MD;  Location: [place];  Service: Orthopedics;  Laterality: Left;  . TRANSTHORACIC ECHOCARDIOGRAM  03/2010   EF 50-55%, normal LV size, no WMAs  . TUBAL LIGATION  1990    Prior to Admission medications   Medication Sig Start Date End Date Taking? Authorizing Provider  cefdinir (OMNICEF) 300 MG capsule Take 1 capsule (300 mg total) by mouth 2 (two) times daily for 5 days. 10/13/20 10/18/20 Yes Paxtyn Boyar, Vista Lawman, MD  ALPRAZolam Duanne Moron) 1 MG tablet Take 1 tablet (1 mg total) by mouth 3 (three) times daily. 09/16/18   Bayard Hugger, NP  baclofen (LIORESAL) 10 MG tablet Take 1 tablet (10 mg total) by mouth 3 (three) times daily as needed for muscle spasms. 10/20/18   Meredith Staggers, MD  diclofenac (VOLTAREN) 75 MG EC tablet Take 1 tablet (75 mg total) by mouth 2 (two) times daily with a meal. 10/20/18   Meredith Staggers, MD  diclofenac sodium (VOLTAREN) 1 % GEL Apply 2 g topically 3 (three) times daily. Use as directed 12/12/16   Bayard Hugger, NP  divalproex (DEPAKOTE ER) 500 MG 24 hr tablet Take 1,000 mg by mouth every evening.     [provider]  DULoxetine (CYMBALTA) 60 MG capsule Take 1 capsule (60 mg total) by mouth daily. 08/03/18   Bayard Hugger, NP   hydrochlorothiazide (HYDRODIURIL) 12.5 MG tablet Take 12.5 mg by mouth daily.  06/19/15   [provider]  lidocaine (LIDODERM) 5 % PLACE 1 PATCH ONTO THE SKIN ONCE DAILY. REMOVE AFTER 12 HOURS AND LEAVE OFF UNTIL THE NEXT DAY. 09/30/17   Bayard Hugger, NP  Melatonin 1 MG TABS Take 1 mg by mouth at bedtime.    [provider]  nitroGLYCERIN (NITROSTAT) 0.4 MG SL tablet Place 0.4 mg under the tongue every 5 (five) minutes as needed for chest pain.  06/11/15   [provider]  polyethylene glycol (MIRALAX / GLYCOLAX) packet Take 17 g by mouth daily as needed for mild constipation.    [provider]  sennosides-docusate sodium (SENOKOT-S) 8.6-50 MG tablet Take 2 tablets by mouth daily. 07/15/16   Marchia Bond, MD  TOVIAZ 8 MG TB24 tablet  02/09/17   [provider]    Allergies Aripiprazole, Bupropion, Lurasidone hcl, and Naproxen  Family History  Problem Relation Age of Onset  . Schizophrenia Mother   .  Hypertension Mother   . Alzheimer's disease Mother   . Stroke Father   . Diabetes Other   . Hypertension Other     Social History Social History   Tobacco Use  . Smoking status: Current Every Day Smoker    Packs/day: 1.00    Years: 36.00    Pack years: 36.00    Types: E-cigarettes  . Smokeless tobacco: Never Used  Substance Use Topics  . Alcohol use: Yes    Alcohol/week: 1.0 standard drink    Types: 1 Shots of liquor per week    Comment: occasional  . Drug use: No    Types: Morphine, Oxycodone    Comment: goes to pain clinic    Review of Systems Constitutional: No fever/chills Eyes: No visual changes. ENT: No sore throat. Cardiovascular: Denies chest pain. Respiratory: Denies shortness of breath. Gastrointestinal: Endorses abdominal pain.  No nausea, no vomiting.  No diarrhea. Genitourinary: Positive for dysuria. Musculoskeletal: Negative for acute arthralgias Skin: Negative for rash. Neurological: Negative for  headaches, weakness/numbness/paresthesias in any extremity Psychiatric: Negative for suicidal ideation/homicidal ideation   ____________________________________________   PHYSICAL EXAM:  VITAL SIGNS: ED Triage Vitals  Enc Vitals Group     BP 10/13/20 0911 133/89     Pulse Rate 10/13/20 0911 77     Resp 10/13/20 0911 18     Temp 10/13/20 0911 99.1 F (37.3 C)     Temp Source 10/13/20 0911 Oral     SpO2 10/13/20 0911 96 %     Weight 10/13/20 0909 270 lb (122.5 kg)     Height 10/13/20 0909 5\' 2"  (1.575 m)     Head Circumference --      Peak Flow --      Pain Score 10/13/20 0909 9     Pain Loc --      Pain Edu? --      Excl. in [place]? --    Constitutional: Alert and oriented. Well appearing and in no acute distress. Eyes: Conjunctivae are normal. PERRL. Head: Atraumatic. Nose: No congestion/rhinnorhea. Mouth/Throat: Mucous membranes are moist. Neck: No stridor Cardiovascular: Grossly normal heart sounds.  Good peripheral circulation. Respiratory: Normal respiratory effort.  No retractions. Gastrointestinal: Soft and nontender. No distention. Musculoskeletal: No obvious deformities Neurologic:  Normal speech and language. No gross focal neurologic deficits are appreciated. Skin:  Skin is warm and dry. No rash noted. Psychiatric: Mood and affect are normal. Speech and behavior are normal.  ____________________________________________   LABS (all labs ordered are listed, but only abnormal results are displayed)  Labs Reviewed  BASIC METABOLIC PANEL - Abnormal; Notable for the following components:      Result Value   Chloride 96 (*)    Glucose, Bld 116 (*)    All other components within normal limits  CBC - Abnormal; Notable for the following components:   RBC 3.61 (*)    Hemoglobin 9.9 (*)    HCT 30.7 (*)    All other components within normal limits  URINALYSIS, COMPLETE (UACMP) WITH MICROSCOPIC - Abnormal; Notable for the following components:   Color, Urine YELLOW  (*)    APPearance HAZY (*)    Leukocytes,Ua TRACE (*)    Bacteria, UA FEW (*)    All other components within normal limits   ____________________________________________   PROCEDURES  Procedure(s) performed (including Critical Care):  .1-3 Lead EKG Interpretation Performed by: Naaman Plummer, MD Authorized by: Naaman Plummer, MD     Interpretation: normal  ECG rate:  76   ECG rate assessment: normal     Rhythm: sinus rhythm     Ectopy: none     Conduction: normal       ____________________________________________   INITIAL IMPRESSION / ASSESSMENT AND PLAN / ED COURSE  As part of my medical decision making, I reviewed the following data within the [place] notes reviewed and incorporated, Labs reviewed, Old chart reviewed, and Notes from prior ED visits reviewed and incorporated        Not Pregnant. Unlikely TOA, Ovarian Torsion, PID, gonorrhea/chlamydia. Low suspicion for Infected Urolithiasis, AAA, Cholecystitis, Pancreatitis, SBO, Appendicitis, or other acute abdomen.  Rx: Cefdinir 300 mg BID for 5 days Disposition: Discharge home. SRP discussed. Advise follow up with primary care provider within 24-72 hours.      ____________________________________________   FINAL CLINICAL IMPRESSION(S) / ED DIAGNOSES  Final diagnoses:  Urinary incontinence, unspecified type  Acute cystitis without hematuria  Dysuria     ED Discharge Orders         Ordered    cefdinir (OMNICEF) 300 MG capsule  2 times daily        10/13/20 1103           Note:  This document was prepared using Dragon voice recognition software and may include unintentional dictation errors.   Naaman Plummer, MD 10/13/20 1215

## 2020-12-10 ENCOUNTER — Ambulatory Visit: Payer: Self-pay | Admitting: Urology

## 2021-01-07 ENCOUNTER — Ambulatory Visit: Payer: Self-pay | Admitting: Urology

## 2021-01-21 ENCOUNTER — Ambulatory Visit: Payer: Self-pay | Admitting: Urology

## 2021-02-06 ENCOUNTER — Ambulatory Visit: Payer: Self-pay | Admitting: Urology

## 2021-02-08 ENCOUNTER — Ambulatory Visit: Payer: Self-pay | Admitting: Urology

## 2021-02-11 ENCOUNTER — Encounter: Payer: Self-pay | Admitting: Urology

## 2021-02-11 ENCOUNTER — Ambulatory Visit (INDEPENDENT_AMBULATORY_CARE_PROVIDER_SITE_OTHER): Payer: Medicare HMO | Admitting: Urology

## 2021-02-11 ENCOUNTER — Other Ambulatory Visit: Payer: Self-pay

## 2021-02-11 VITALS — BP 122/78 | HR 102 | Ht 62.0 in | Wt 250.0 lb

## 2021-02-11 DIAGNOSIS — R32 Unspecified urinary incontinence: Secondary | ICD-10-CM | POA: Diagnosis not present

## 2021-02-11 DIAGNOSIS — N3946 Mixed incontinence: Secondary | ICD-10-CM

## 2021-02-11 MED ORDER — MIRABEGRON ER 50 MG PO TB24: 50.0000 mg | ORAL_TABLET | Freq: Every day | ORAL | 11 refills | Status: AC

## 2021-02-11 NOTE — Progress Notes (Signed)
02/11/2021 3:46 PM   [name] [birth year] [id number]  Referring provider: [address], [address] [place],  [place] [id number]  Chief Complaint  Patient presents with  . Urinary Incontinence    HPI: Consulted to assess the patient's urinary continence.  She saw physician 4 years ago and has been on Vesicare for mixed incontinence.  She has urge incontinence.  She leaks with coughing sneezing and sometimes with bending lifting.  No bedwetting.  She voids every 2 hours gets up 4 times at night.  Is taking shots for diabetes.  Has had a hysterectomy  She thinks sometimes she smells blood in the vaginal area but is never seen any.  No history of kidney stones bladder surgery bladder infections   PMH: Past Medical History:  Diagnosis Date  . Anemia   . Anxiety   . Bipolar 1 disorder ([place])   . 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 reports that she is cured   . COPD (chronic obstructive pulmonary disease) ([place])   . Depression   . Fibromyalgia   . GERD (gastroesophageal reflux disease)   . Headache    migraine's in 20's  . Hypertension   . Insomnia   . Obesity   . Osteoarthritis    knees, elbows   . 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 Pneumonia    developed in 07/2015- post shoulder replacement   . 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 Primary localized osteoarthritis of left knee 03/04/2016  . Primary localized osteoarthritis of right knee 07/15/2016  . PUD (peptic ulcer disease)   . Spinal stenosis     Surgical History: Past Surgical History:  Procedure Laterality Date  . ABDOMINAL HYSTERECTOMY  1998  . CARDIOVASCULAR STRESS TEST  03/2010   Lexiscan Myoview: EF 55%, mild breast attenuation, no ischemia or scar  . DILATION AND CURETTAGE OF UTERUS  1986   "lost a son"  . esophageal ulcer    . JOINT REPLACEMENT    . KNEE ARTHROSCOPY Right 2005  . TONSILLECTOMY    . TOTAL KNEE  ARTHROPLASTY Left 03/04/2016   Procedure: TOTAL KNEE ARTHROPLASTY;  Surgeon: Marchia Bond, MD;  Location: [place];  Service: Orthopedics;  Laterality: Left;  . TOTAL KNEE ARTHROPLASTY Right 07/15/2016   Procedure: TOTAL KNEE ARTHROPLASTY;  Surgeon: Marchia Bond, MD;  Location: [place];  Service: Orthopedics;  Laterality: Right;  . TOTAL SHOULDER ARTHROPLASTY Left 07/17/2015   Procedure: TOTAL SHOULDER ARTHROPLASTY;  Surgeon: Marchia Bond, MD;  Location: [place];  Service: Orthopedics;  Laterality: Left;  . TRANSTHORACIC ECHOCARDIOGRAM  03/2010   EF 50-55%, normal LV size, no WMAs  . TUBAL LIGATION  1990    Home Medications:  Allergies as of 02/11/2021      Reactions   Aripiprazole Other (See Comments)   Causes body shaking and tremors Other reaction(s): Other Other reaction(s): Other (See Comments) Causes body shaking and tremors Involuntary movements Causes body shaking and tremors Other reaction(s): Other (See Comments) Causes body shaking and tremors Involuntary movements Causes body shaking and tremors Other reaction(s): Other (See Comments) Causes body shaking and tremors Involuntary movements   Bupropion Other (See Comments)   Shakes and jerking EPS  Shakes and jerking EPS  Other reaction(s): Other Shakes and jerking Shakes and jerking EPS    Lurasidone Hcl Other (See Comments), Nausea And Vomiting   Causes body shaking and tremors EPS. She is intolerant to all AAP  in this way Causes body shaking and tremors Causes body shaking and tremors EPS. She is intolerant to all AAP in this way Causes body shaking and tremors Other reaction(s): Other Causes body shaking and tremors EPS. She is intolerant to all AAP in this way Causes body shaking and tremors Causes body shaking and tremors EPS. She is intolerant to all AAP in this way Causes body shaking and tremors EPS. She is intolerant to all AAP in this way Causes body shaking and tremors   Naproxen Anaphylaxis, Swelling       Medication List       Accurate as of Feb 11, 2021  3:46 PM. If you have any questions, ask your nurse or doctor.        STOP taking these medications   diclofenac 75 MG EC tablet Commonly known as: VOLTAREN Stopped by: Reece Packer, MD   diclofenac sodium 1 % Gel Commonly known as: VOLTAREN Stopped by: Reece Packer, MD     TAKE these medications   ALPRAZolam 1 MG tablet Commonly known as: XANAX Take 1 tablet (1 mg total) by mouth 3 (three) times daily.   baclofen 10 MG tablet Commonly known as: LIORESAL Take 1 tablet (10 mg total) by mouth 3 (three) times daily as needed for muscle spasms.   divalproex 500 MG 24 hr tablet Commonly known as: DEPAKOTE ER Take 1,000 mg by mouth every evening.   DULoxetine 60 MG capsule Commonly known as: CYMBALTA Take 1 capsule (60 mg total) by mouth daily.   hydrochlorothiazide 12.5 MG tablet Commonly known as: HYDRODIURIL Take 12.5 mg by mouth daily.   lidocaine 5 % Commonly known as: LIDODERM PLACE 1 PATCH ONTO THE SKIN ONCE DAILY. REMOVE AFTER 12 HOURS AND LEAVE OFF UNTIL THE NEXT DAY.   melatonin 1 MG Tabs tablet Take 1 mg by mouth at bedtime.   nitroGLYCERIN 0.4 MG SL tablet Commonly known as: NITROSTAT Place 0.4 mg under the tongue every 5 (five) minutes as needed for chest pain.   polyethylene glycol 17 g packet Commonly known as: MIRALAX / GLYCOLAX Take 17 g by mouth daily as needed for mild constipation.   sennosides-docusate sodium 8.6-50 MG tablet Commonly known as: SENOKOT-S Take 2 tablets by mouth daily.   Toviaz 8 MG Tb24 tablet Generic drug: fesoterodine       Allergies:  Allergies  Allergen Reactions  . Aripiprazole Other (See Comments)    Causes body shaking and tremors Other reaction(s): Other Other reaction(s): Other (See Comments) Causes body shaking and tremors Involuntary movements Causes body shaking and tremors Other reaction(s): Other (See Comments) Causes body shaking  and tremors Involuntary movements Causes body shaking and tremors Other reaction(s): Other (See Comments) Causes body shaking and tremors Involuntary movements   . Bupropion Other (See Comments)    Shakes and jerking EPS  Shakes and jerking EPS  Other reaction(s): Other Shakes and jerking Shakes and jerking EPS   . Lurasidone Hcl Other (See Comments) and Nausea And Vomiting    Causes body shaking and tremors EPS. She is intolerant to all AAP in this way Causes body shaking and tremors Causes body shaking and tremors EPS. She is intolerant to all AAP in this way Causes body shaking and tremors Other reaction(s): Other Causes body shaking and tremors EPS. She is intolerant to all AAP in this way Causes body shaking and tremors Causes body shaking and tremors EPS. She is intolerant to all AAP in this way Causes body  shaking and tremors EPS. She is intolerant to all AAP in this way Causes body shaking and tremors  . Naproxen Anaphylaxis and Swelling    Family History: Family History  Problem Relation Age of Onset  . Schizophrenia Mother   . Hypertension Mother   . Alzheimer's disease Mother   . Stroke Father   . Diabetes Other   . Hypertension Other     Social History:  reports that she has been smoking e-cigarettes. She has a 36.00 pack-year smoking history. She has never used smokeless tobacco. She reports current alcohol use of about 1.0 standard drink of alcohol per week. She reports that she does not use drugs.  ROS:                                        Physical Exam: There were no vitals taken for this visit.  Constitutional:  Alert and oriented, No acute distress. HEENT: [place] AT, moist mucus membranes.  Trachea midline, no masses. Cardiovascular: No clubbing, cyanosis, or edema. Respiratory: Normal respiratory effort, no increased work of breathing. GI: Abdomen is soft, nontender, nondistended, no abdominal masses GU: No CVA  tenderness.  Well supported bladder neck and negative cough test with a good cough.  No prolapse no blood in the vaginal vault Skin: No rashes, bruises or suspicious lesions. Lymph: No cervical or inguinal adenopathy. Neurologic: Grossly intact, no focal deficits, moving all 4 extremities. Psychiatric: Normal mood and affect.  Laboratory Data: Lab Results  Component Value Date   WBC 8.9 10/13/2020   HGB 9.9 (L) 10/13/2020   HCT 30.7 (L) 10/13/2020   MCV 85.0 10/13/2020   PLT 311 10/13/2020    Lab Results  Component Value Date   CREATININE 0.71 10/13/2020    No results found for: PSA  No results found for: TESTOSTERONE  Lab Results  Component Value Date   HGBA1C 5.6 12/25/2013    Urinalysis    Component Value Date/Time   COLORURINE YELLOW (A) 10/13/2020 0914   APPEARANCEUR HAZY (A) 10/13/2020 0914   LABSPEC 1.011 10/13/2020 0914   PHURINE 6.0 10/13/2020 0914   GLUCOSEU NEGATIVE 10/13/2020 0914   HGBUR NEGATIVE 10/13/2020 0914   BILIRUBINUR NEGATIVE 10/13/2020 0914   KETONESUR NEGATIVE 10/13/2020 0914   PROTEINUR NEGATIVE 10/13/2020 0914   UROBILINOGEN 0.2 07/19/2015 1154   NITRITE NEGATIVE 10/13/2020 0914   LEUKOCYTESUR TRACE (A) 10/13/2020 0914    Pertinent Imaging: Urine reviewed.  Urine sent for culture.  Chart reviewed  Assessment & Plan: Reassess in 6 weeks on Myrbetriq 50 mg samples and prescription.   Eventually may need urodynamics and cystoscopy.  She had urodynamics a number of years ago  1. Urinary incontinence, unspecified type  - Urinalysis, Complete   No follow-ups on file.  Reece Packer, MD  [facility] [address], [place], [place] [address] [phone]

## 2021-02-12 LAB — URINALYSIS, COMPLETE
Bilirubin, UA: NEGATIVE
Glucose, UA: NEGATIVE
Leukocytes,UA: NEGATIVE
Nitrite, UA: NEGATIVE
Protein,UA: NEGATIVE
RBC, UA: NEGATIVE
Specific Gravity, UA: 1.02 (ref 1.005–1.030)
Urobilinogen, Ur: 0.2 mg/dL (ref 0.2–1.0)
pH, UA: 6 (ref 5.0–7.5)

## 2021-02-12 LAB — MICROSCOPIC EXAMINATION
Bacteria, UA: NONE SEEN
Epithelial Cells (non renal): NONE SEEN /hpf (ref 0–10)

## 2021-03-25 ENCOUNTER — Ambulatory Visit: Payer: Self-pay | Admitting: Urology

## 2021-04-26 ENCOUNTER — Ambulatory Visit
Admission: EM | Admit: 2021-04-26 | Discharge: 2021-04-26 | Disposition: A | Discharge status: Home / Self Care | Payer: Medicare HMO | Attending: Urgent Care | Admitting: Urgent Care

## 2021-04-26 ENCOUNTER — Other Ambulatory Visit: Payer: Self-pay

## 2021-04-26 DIAGNOSIS — K047 Periapical abscess without sinus: Secondary | ICD-10-CM

## 2021-04-26 DIAGNOSIS — R220 Localized swelling, mass and lump, head: Secondary | ICD-10-CM

## 2021-04-26 DIAGNOSIS — R519 Headache, unspecified: Secondary | ICD-10-CM

## 2021-04-26 MED ORDER — HYDROCODONE-ACETAMINOPHEN 5-325 MG PO TABS
1.0000 | ORAL_TABLET | Freq: Four times a day (QID) | ORAL | 0 refills | Status: AC | PRN
Start: 2021-04-26 — End: ?

## 2021-04-26 MED ORDER — AMOXICILLIN-POT CLAVULANATE 875-125 MG PO TABS: 1.0000 | ORAL_TABLET | Freq: Two times a day (BID) | ORAL | 0 refills | Status: AC

## 2021-04-26 MED ORDER — LIDOCAINE VISCOUS HCL 2 % MT SOLN: 15.0000 mL | OROMUCOSAL | 0 refills | Status: AC | PRN

## 2021-04-26 NOTE — ED Provider Notes (Signed)
[place]   MRN: [id number] DOB: 04-08-1966  Subjective:   [name] is a 55 y.o. female presenting for 3-day history of acute onset recurrent right-sided jaw pain, facial pain, dental pain.  Patient has a hard time opening her mouth.  She has a Pharmacist, community, has previously been told that she needs to have dentures placed in multiple teeth extracted.  Was unable to afford it at the time so did not pursue this.  No current facility-administered medications for this encounter.  Current Outpatient Medications:    ALPRAZolam (XANAX) 1 MG tablet, Take 1 tablet (1 mg total) by mouth 3 (three) times daily., Disp: 90 tablet, Rfl: 1   baclofen (LIORESAL) 10 MG tablet, Take 1 tablet (10 mg total) by mouth 3 (three) times daily as needed for muscle spasms., Disp: 90 tablet, Rfl: 2   divalproex (DEPAKOTE ER) 500 MG 24 hr tablet, Take 1,000 mg by mouth every evening., Disp: , Rfl:    DULoxetine (CYMBALTA) 60 MG capsule, Take 1 capsule (60 mg total) by mouth daily., Disp: 90 capsule, Rfl: 3   hydrochlorothiazide (HYDRODIURIL) 12.5 MG tablet, Take 12.5 mg by mouth daily. , Disp: , Rfl:    lidocaine (LIDODERM) 5 %, PLACE 1 PATCH ONTO THE SKIN ONCE DAILY. REMOVE AFTER 12 HOURS AND LEAVE OFF UNTIL THE NEXT DAY., Disp: 30 patch, Rfl: 2   Melatonin 1 MG TABS, Take 1 mg by mouth at bedtime., Disp: , Rfl:    mirabegron ER (MYRBETRIQ) 50 MG TB24 tablet, Take 1 tablet (50 mg total) by mouth daily., Disp: 30 tablet, Rfl: 11   nitroGLYCERIN (NITROSTAT) 0.4 MG SL tablet, Place 0.4 mg under the tongue every 5 (five) minutes as needed for chest pain. , Disp: , Rfl:    polyethylene glycol (MIRALAX / GLYCOLAX) packet, Take 17 g by mouth daily as needed for mild constipation., Disp: , Rfl:    sennosides-docusate sodium (SENOKOT-S) 8.6-50 MG tablet, Take 2 tablets by mouth daily., Disp: 30 tablet, Rfl: 1   TOVIAZ 8 MG TB24 tablet, , Disp: , Rfl:    Allergies  Allergen Reactions   Aripiprazole Other (See  Comments)    Causes body shaking and tremors Other reaction(s): Other Other reaction(s): Other (See Comments) Causes body shaking and tremors Involuntary movements Causes body shaking and tremors Other reaction(s): Other (See Comments) Causes body shaking and tremors Involuntary movements Causes body shaking and tremors Other reaction(s): Other (See Comments) Causes body shaking and tremors Involuntary movements    Bupropion Other (See Comments)    Shakes and jerking EPS  Shakes and jerking EPS  Other reaction(s): Other Shakes and jerking Shakes and jerking EPS    Lurasidone Hcl Other (See Comments) and Nausea And Vomiting    Causes body shaking and tremors EPS. She is intolerant to all AAP in this way Causes body shaking and tremors Causes body shaking and tremors EPS. She is intolerant to all AAP in this way Causes body shaking and tremors Other reaction(s): Other Causes body shaking and tremors EPS. She is intolerant to all AAP in this way Causes body shaking and tremors Causes body shaking and tremors EPS. She is intolerant to all AAP in this way Causes body shaking and tremors EPS. 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 hyperthermia variant post op 03/07/2016   Primary localized osteoarthritis of left knee 03/04/2016   Primary localized  osteoarthritis of right knee 07/15/2016   PUD (peptic ulcer disease)    Spinal stenosis      Past Surgical History:  Procedure Laterality Date   ABDOMINAL HYSTERECTOMY  1998   CARDIOVASCULAR STRESS TEST  03/2010   Lexiscan Myoview: EF 55%, mild breast attenuation, no ischemia or scar   [place]   "lost a son"   esophageal ulcer     JOINT REPLACEMENT     KNEE ARTHROSCOPY Right 2005   TONSILLECTOMY     TOTAL KNEE ARTHROPLASTY Left 03/04/2016   Procedure: TOTAL KNEE ARTHROPLASTY;  Surgeon: Marchia Bond, MD;  Location: [facility];  Service: Orthopedics;  Laterality: Left;   TOTAL KNEE ARTHROPLASTY Right 07/15/2016   Procedure: TOTAL KNEE ARTHROPLASTY;  Surgeon: Marchia Bond, MD;  Location: [facility];  Service: Orthopedics;  Laterality: Right;   TOTAL SHOULDER ARTHROPLASTY Left 07/17/2015   Procedure: TOTAL SHOULDER ARTHROPLASTY;  Surgeon: Marchia Bond, MD;  Location: [facility];  Service: Orthopedics;  Laterality: Left;   TRANSTHORACIC ECHOCARDIOGRAM  03/2010   EF 50-55%, normal LV size, no WMAs   TUBAL LIGATION  1990    Family History  Problem Relation Age of Onset   Schizophrenia Mother    Hypertension Mother    Alzheimer's disease Mother    Stroke Father    Diabetes Other    Hypertension Other     Social History   Tobacco Use   Smoking status: Every Day    Packs/day: 1.00    Years: 36.00    Pack years: 36.00    Types: E-cigarettes, Cigarettes   Smokeless tobacco: Never  Substance Use Topics   Alcohol use: Yes    Alcohol/week: 1.0 standard drink    Types: 1 Shots of liquor per week    Comment: occasional   Drug use: No    Types: Morphine, Oxycodone    Comment: goes to pain clinic    ROS   Objective:   Vitals: BP 112/76 (BP Location: Left Arm)   Pulse 94   Temp 97.9 F (36.6 C) (Oral)   Resp 18   SpO2 95%   Physical Exam Constitutional:      General: She is not in acute distress.    Appearance: Normal appearance. She is  well-developed. She is not ill-appearing, toxic-appearing or diaphoretic.  HENT:     Head: Normocephalic and atraumatic.      Nose: Nose normal.     Mouth/Throat:     Mouth: Mucous membranes are moist.     Pharynx: Oropharynx is clear.      Comments: Trismus is noted. Eyes:     General: No scleral icterus.    Extraocular Movements: Extraocular movements intact.     Pupils: Pupils are equal, round, and reactive to light.  Cardiovascular:     Rate  and Rhythm: Normal rate.  Pulmonary:     Effort: Pulmonary effort is normal.  Skin:    General: Skin is warm and dry.  Neurological:     General: No focal deficit present.     Mental Status: She is alert and oriented to person, place, and time.  Psychiatric:        Mood and Affect: Mood normal.        Behavior: Behavior normal.    Assessment and Plan :   I have reviewed the PDMP during this encounter.  1. Dental abscess   2. Facial pain   3. Facial swelling     Start Augmentin for dental infection/abscess, use Tylenol, viscous lidocaine for pain and hydrocodone for breakthrough pain. Emphasized need for dental surgeon consult. Counseled patient on potential for adverse effects with medications prescribed/recommended today, strict ER and return-to-clinic precautions discussed, patient verbalized understanding.    Jaynee Eagles, [place] 04/26/21 1107

## 2021-04-26 NOTE — ED Triage Notes (Signed)
Pt c/o rt lower toothache with swelling x3 days. States has a broken tooth.

## 2021-10-30 ENCOUNTER — Other Ambulatory Visit: Payer: Self-pay

## 2021-10-30 ENCOUNTER — Ambulatory Visit
Admission: EM | Admit: 2021-10-30 | Discharge: 2021-10-30 | Disposition: A | Discharge status: Home / Self Care | Payer: Medicare PPO | Attending: Internal Medicine | Admitting: Internal Medicine

## 2021-10-30 DIAGNOSIS — M25551 Pain in right hip: Secondary | ICD-10-CM

## 2021-10-30 NOTE — ED Provider Notes (Signed)
[facility]    CSN: [id number] Arrival date & time: 10/30/21  1550      History   Chief Complaint Chief Complaint  Patient presents with   Leg Problem    HPI Joy SMOLA is a 56 y.o. female.   Patient presents with right hip and groin pain that has been present for approximately 2 weeks.  Denies any apparent injury or any past injuries.  Patient reports that it "feels like the hip will catch" and then she is able to move again.  Pain exacerbates movement. She does have a history of arthritis in bilateral knees and left shoulder where she has had to have replacement of the joints.  Last saw orthopedist in 2017.  Denies any urinary burning, urinary frequency, irregular vaginal bleeding or hematuria.  Having regular bowel movements.  Denies nausea or vomiting.  Denies chance of pregnancy.  She does have back pain but states this is normal as she has spinal stenosis.    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 reports that she is cured    COPD (chronic obstructive pulmonary disease) ([place])    Depression    Fibromyalgia    GERD (gastroesophageal reflux disease)    Headache    migraine's in 20's   Hypertension    Insomnia    Obesity    Osteoarthritis    knees, elbows    Osteoarthritis of left shoulder 06/08/2013   Pneumonia    developed in 07/2015- post shoulder replacement    possible malignant hyperthermia variant post op 03/07/2016   Primary localized osteoarthritis of left knee 03/04/2016   Primary localized osteoarthritis of right knee 07/15/2016   PUD (peptic ulcer disease)    Spinal stenosis     Patient Active Problem List   Diagnosis Date Noted   Fibromyalgia 10/20/2018   Biceps tendonitis on right 02/11/2017   Primary localized osteoarthritis of right knee 07/15/2016   Postoperative anemia due to acute blood loss 03/07/2016   possible  malignant hyperthermia variant post op 03/07/2016   Sinus tachycardia 03/05/2016   GERD (gastroesophageal reflux disease) 03/05/2016   SVT (supraventricular tachycardia) (HCC)    Atelectasis    Absolute anemia    Arterial hypotension    Pulmonary emphysema (HCC)    Primary localized osteoarthritis of left knee 03/04/2016   S/P total knee arthroplasty 03/04/2016   Sepsis ([place]) 07/19/2015   Tobacco abuse 07/19/2015   COPD (chronic obstructive pulmonary disease) ([place]) 07/19/2015   S/P shoulder replacement    Tachycardia    Tremor of both hands 02/28/2015   Lumbar facet arthropathy 02/28/2015   Chest pain at rest 12/25/2013   Chronic pain disorder 12/25/2013   Chest pain due to psychological stress 12/25/2013   Insomnia disorder 10/04/2013   Osteoarthritis of left shoulder 06/08/2013   Unspecified hereditary and idiopathic peripheral neuropathy 05/10/2013   Lumbosacral spondylosis without myelopathy 05/10/2013   Osteoarthritis of both knees 05/10/2013   Left rotator cuff tear arthropathy 05/10/2013   RESTLESS LEG SYNDROME 05/26/2009   HYPERSOMNIA, ASSOCIATED WITH SLEEP APNEA 03/28/2009   Anxiety state 02/23/2009   Depression 02/23/2009   ARTHRITIS 02/23/2009   DYSPHAGIA UNSPECIFIED 02/23/2009   CERVICAL CANCER, HX OF 02/23/2009   Bipolar disorder ([place]) 02/21/2009   GERD 02/21/2009   IRRITABLE BOWEL SYNDROME 02/21/2009   NECK PAIN 02/21/2009  Myalgia and myositis 02/21/2009    Past Surgical History:  Procedure Laterality Date   ABDOMINAL HYSTERECTOMY  1998   CARDIOVASCULAR STRESS TEST  03/2010   Lexiscan Myoview: EF 55%, mild breast attenuation, no ischemia or scar   DILATION AND CURETTAGE OF UTERUS  1986   "lost a son"   esophageal ulcer     JOINT REPLACEMENT     KNEE ARTHROSCOPY Right 2005   TONSILLECTOMY     TOTAL KNEE ARTHROPLASTY Left 03/04/2016   Procedure: TOTAL KNEE ARTHROPLASTY;  Surgeon: Marchia Bond, MD;  Location: [facility];  Service: Orthopedics;  Laterality:  Left;   TOTAL KNEE ARTHROPLASTY Right 07/15/2016   Procedure: TOTAL KNEE ARTHROPLASTY;  Surgeon: Marchia Bond, MD;  Location: [place] [facility];  Service: Orthopedics;  Laterality: Right;   TOTAL SHOULDER ARTHROPLASTY Left 07/17/2015   Procedure: TOTAL SHOULDER ARTHROPLASTY;  Surgeon: Marchia Bond, MD;  Location: [facility];  Service: Orthopedics;  Laterality: Left;   TRANSTHORACIC ECHOCARDIOGRAM  03/2010   EF 50-55%, normal LV size, no WMAs   TUBAL LIGATION  1990    OB History   No obstetric history on file.      Home Medications    Prior to Admission medications   Medication Sig Start Date End Date Taking? Authorizing Provider  ALPRAZolam Duanne Moron) 1 MG tablet Take 1 tablet (1 mg total) by mouth 3 (three) times daily. 09/16/18   Bayard Hugger, NP  amoxicillin-clavulanate (AUGMENTIN) 875-125 MG tablet Take 1 tablet by mouth every 12 (twelve) hours. 04/26/21   Jaynee Eagles, PA-C  baclofen (LIORESAL) 10 MG tablet Take 1 tablet (10 mg total) by mouth 3 (three) times daily as needed for muscle spasms. 10/20/18   Meredith Staggers, MD  divalproex (DEPAKOTE ER) 500 MG 24 hr tablet Take 1,000 mg by mouth every evening.    [provider]  DULoxetine (CYMBALTA) 60 MG capsule Take 1 capsule (60 mg total) by mouth daily. 08/03/18   Bayard Hugger, NP  hydrochlorothiazide (HYDRODIURIL) 12.5 MG tablet Take 12.5 mg by mouth daily.  06/19/15   [provider]  HYDROcodone-acetaminophen (NORCO/VICODIN) 5-325 MG tablet Take 1 tablet by mouth every 6 (six) hours as needed for severe pain. 04/26/21   Jaynee Eagles, PA-C  lidocaine (LIDODERM) 5 % PLACE 1 PATCH ONTO THE SKIN ONCE DAILY. REMOVE AFTER 12 HOURS AND LEAVE OFF UNTIL THE NEXT DAY. 09/30/17   Bayard Hugger, NP  lidocaine (XYLOCAINE) 2 % solution Use as directed 15 mLs in the mouth or throat as needed for mouth pain. 04/26/21   Jaynee Eagles, PA-C  Melatonin 1 MG TABS Take 1 mg by mouth at bedtime.    [provider]  mirabegron ER (MYRBETRIQ)  50 MG TB24 tablet Take 1 tablet (50 mg total) by mouth daily. 02/11/21   Bjorn Loser, MD  nitroGLYCERIN (NITROSTAT) 0.4 MG SL tablet Place 0.4 mg under the tongue every 5 (five) minutes as needed for chest pain.  06/11/15   [provider]  polyethylene glycol (MIRALAX / GLYCOLAX) packet Take 17 g by mouth daily as needed for mild constipation.    [provider]  sennosides-docusate sodium (SENOKOT-S) 8.6-50 MG tablet Take 2 tablets by mouth daily. 07/15/16   Marchia Bond, MD  TOVIAZ 8 MG TB24 tablet  02/09/17   [provider]    Family History Family History  Problem Relation Age of Onset   Schizophrenia Mother    Hypertension Mother    Alzheimer's disease Mother  Stroke Father    Diabetes Other    Hypertension Other     Social History Social History   Tobacco Use   Smoking status: Every Day    Packs/day: 1.00    Years: 36.00    Pack years: 36.00    Types: E-cigarettes, Cigarettes   Smokeless tobacco: Never  Substance Use Topics   Alcohol use: Yes    Alcohol/week: 1.0 standard drink    Types: 1 Shots of liquor per week    Comment: occasional   Drug use: No    Types: Morphine, Oxycodone    Comment: goes to pain clinic     Allergies   Aripiprazole, Bupropion, Lurasidone hcl, and Naproxen   Review of Systems Review of Systems Per HPI  Physical Exam Triage Vital Signs ED Triage Vitals [10/30/21 1658]  Enc Vitals Group     BP 102/75     Pulse Rate (!) 101     Resp 18     Temp 98.7 F (37.1 C)     Temp Source Oral     SpO2 96 %     Weight      Height      Head Circumference      Peak Flow      Pain Score 0     Pain Loc      Pain Edu?      Excl. in [place]?    No data found.  Updated Vital Signs BP 102/75 (BP Location: Left Arm)    Pulse (!) 101    Temp 98.7 F (37.1 C) (Oral)    Resp 18    SpO2 96%   Visual Acuity Right Eye Distance:   Left Eye Distance:   Bilateral Distance:    Right Eye Near:   Left Eye Near:     Bilateral Near:     Physical Exam Constitutional:      General: She is not in acute distress.    Appearance: Normal appearance. She is not toxic-appearing or diaphoretic.  HENT:     Head: Normocephalic and atraumatic.  Eyes:     Extraocular Movements: Extraocular movements intact.     Conjunctiva/sclera: Conjunctivae normal.  Pulmonary:     Effort: Pulmonary effort is normal.  Musculoskeletal:     Right hip: Tenderness and bony tenderness present. No deformity or crepitus. Normal range of motion. Normal strength.     Left hip: Normal.       Legs:     Comments: Tenderness to palpation throughout right groin.  Also has tenderness to palpation to right lateral hip.  No obvious bruising, lacerations, erythema noted.  Patient does have full range of motion of hip but with pain.  Neurovascular intact.  Neurological:     General: No focal deficit present.     Mental Status: She is alert and oriented to person, place, and time. Mental status is at baseline.  Psychiatric:        Mood and Affect: Mood normal.        Behavior: Behavior normal.        Thought Content: Thought content normal.        Judgment: Judgment normal.     UC Treatments / Results  Labs (all labs ordered are listed, but only abnormal results are displayed) Labs Reviewed - No data to display  EKG   Radiology No results found.  Procedures Procedures (including critical care time)  Medications Ordered in UC Medications - No data to display  Initial Impression / Assessment and Plan / UC Course  I have reviewed the triage vital signs and the nursing notes.  Pertinent labs & imaging results that were available during my care of the patient were reviewed by me and considered in my medical decision making (see chart for details).     I do think that patient needs x-ray of right hip for more extensive evaluation.  It is highly likely that patient has arthritis of right hip or inflammation causing pain.  Do not  have x-ray in urgent care today.  Outpatient image ordered at [facility].  Patient will need to follow-up for further evaluation and management by orthopedist.  Provided patient with contact information for this.  Discussed supportive care with patient.  Ice application.  Patient verbalized understanding and was agreeable with plan. Final Clinical Impressions(s) / UC Diagnoses   Final diagnoses:  Right hip pain     Discharge Instructions      Please go to [facility] to have hip x-ray completed.  You will go to the emergency department and ask where the imaging center is.  Do not check into the emergency department as a patient.  You will go to the imaging center, have x-ray completed, and leave.  We will call if there are any abnormalities on your x-ray.  You may follow-up with provided contact information for orthopedist for further evaluation and management.    ED Prescriptions   None    PDMP not reviewed this encounter.   Teodora Medici, [place] 10/30/21 1726

## 2021-10-30 NOTE — Discharge Instructions (Signed)
Please go to [facility] to have hip x-ray completed.  You will go to the emergency department and ask where the imaging center is.  Do not check into the emergency department as a patient.  You will go to the imaging center, have x-ray completed, and leave.  We will call if there are any abnormalities on your x-ray.  You may follow-up with provided contact information for orthopedist for further evaluation and management.

## 2021-10-30 NOTE — ED Triage Notes (Signed)
Pt c/o right groin pain the moves into right thigh onset ~ 2 weeks ago states "I don't know if I need my hip replaced" and "sometimes it catches, like it's trying to stop me from walking."

## 2021-10-31 ENCOUNTER — Ambulatory Visit (INDEPENDENT_AMBULATORY_CARE_PROVIDER_SITE_OTHER): Payer: Medicare PPO

## 2021-10-31 ENCOUNTER — Ambulatory Visit
Admission: EM | Admit: 2021-10-31 | Discharge: 2021-10-31 | Disposition: A | Discharge status: Home / Self Care | Payer: Medicare PPO | Attending: Internal Medicine | Admitting: Internal Medicine

## 2021-10-31 ENCOUNTER — Encounter: Payer: Self-pay | Admitting: Emergency Medicine

## 2021-10-31 DIAGNOSIS — M25559 Pain in unspecified hip: Secondary | ICD-10-CM | POA: Diagnosis not present

## 2021-10-31 DIAGNOSIS — M25551 Pain in right hip: Secondary | ICD-10-CM

## 2021-10-31 NOTE — Discharge Instructions (Signed)
Your x-ray was normal.  Please follow-up with orthopedist for further evaluation and management.

## 2021-10-31 NOTE — ED Provider Notes (Signed)
[facility]    CSN: [id number] Arrival date & time: 10/31/21  4010      History   Chief Complaint Chief Complaint  Patient presents with   Hip Pain    HPI [name] is a 56 y.o. female.   Patient was seen yesterday for same chief complaint of right hip pain.  Please see previous notes.  There was no x-ray tech in urgent care yesterday, so an outpatient x-ray was ordered at [facility].  Patient reports that she went to the imaging center but they closed at 5:30 PM.  She was not able to have x-ray performed, so she is here today to have x-ray completed.  Symptoms have not changed since yesterday.   Hip Pain   Past Medical History:  Diagnosis Date   Anemia    Anxiety    Bipolar 1 disorder ([place])    Cancer ([place])    uterine CA/- resulted in hysterectomy, pt. reports that she is cured    COPD (chronic obstructive pulmonary disease) ([place])    Depression    Fibromyalgia    GERD (gastroesophageal reflux disease)    Headache    migraine's in 20's   Hypertension    Insomnia    Obesity    Osteoarthritis    knees, elbows    Osteoarthritis of left shoulder 06/08/2013   Pneumonia    developed in 07/2015- post shoulder replacement    possible malignant hyperthermia variant post op 03/07/2016   Primary localized osteoarthritis of left knee 03/04/2016   Primary localized osteoarthritis of right knee 07/15/2016   PUD (peptic ulcer disease)    Spinal stenosis     Patient Active Problem List   Diagnosis Date Noted   Fibromyalgia 10/20/2018   Biceps tendonitis on right 02/11/2017   Primary localized osteoarthritis of right knee 07/15/2016   Postoperative anemia due to acute blood loss 03/07/2016   possible malignant hyperthermia variant post op 03/07/2016   Sinus tachycardia 03/05/2016   GERD (gastroesophageal reflux disease) 03/05/2016   SVT (supraventricular tachycardia) (HCC)    Atelectasis    Absolute anemia    Arterial hypotension    Pulmonary  emphysema (HCC)    Primary localized osteoarthritis of left knee 03/04/2016   S/P total knee arthroplasty 03/04/2016   Sepsis ([place]) 07/19/2015   Tobacco abuse 07/19/2015   COPD (chronic obstructive pulmonary disease) ([place]) 07/19/2015   S/P shoulder replacement    Tachycardia    Tremor of both hands 02/28/2015   Lumbar facet arthropathy 02/28/2015   Chest pain at rest 12/25/2013   Chronic pain disorder 12/25/2013   Chest pain due to psychological stress 12/25/2013   Insomnia disorder 10/04/2013   Osteoarthritis of left shoulder 06/08/2013   Unspecified hereditary and idiopathic peripheral neuropathy 05/10/2013   Lumbosacral spondylosis without myelopathy 05/10/2013   Osteoarthritis of both knees 05/10/2013   Left rotator cuff tear arthropathy 05/10/2013   RESTLESS LEG SYNDROME 05/26/2009   HYPERSOMNIA, ASSOCIATED WITH SLEEP APNEA 03/28/2009   Anxiety state 02/23/2009   Depression 02/23/2009   ARTHRITIS 02/23/2009   DYSPHAGIA UNSPECIFIED 02/23/2009   CERVICAL CANCER, HX OF 02/23/2009   Bipolar disorder ([place]) 02/21/2009   GERD 02/21/2009   IRRITABLE BOWEL SYNDROME 02/21/2009   NECK PAIN 02/21/2009   Myalgia and myositis 02/21/2009    Past Surgical History:  Procedure Laterality Date   ABDOMINAL HYSTERECTOMY  1998   CARDIOVASCULAR STRESS TEST  03/2010   Lexiscan Myoview: EF 55%, mild breast attenuation, no ischemia  or scar   DILATION AND CURETTAGE OF UTERUS  1986   "lost a son"   esophageal ulcer     JOINT REPLACEMENT     KNEE ARTHROSCOPY Right 2005   TONSILLECTOMY     TOTAL KNEE ARTHROPLASTY Left 03/04/2016   Procedure: TOTAL KNEE ARTHROPLASTY;  Surgeon: Marchia Bond, MD;  Location: [place];  Service: Orthopedics;  Laterality: Left;   TOTAL KNEE ARTHROPLASTY Right 07/15/2016   Procedure: TOTAL KNEE ARTHROPLASTY;  Surgeon: Marchia Bond, MD;  Location: [place];  Service: Orthopedics;  Laterality: Right;   TOTAL SHOULDER ARTHROPLASTY Left 07/17/2015   Procedure: TOTAL SHOULDER  ARTHROPLASTY;  Surgeon: Marchia Bond, MD;  Location: [place];  Service: Orthopedics;  Laterality: Left;   TRANSTHORACIC ECHOCARDIOGRAM  03/2010   EF 50-55%, normal LV size, no WMAs   TUBAL LIGATION  1990    OB History   No obstetric history on file.      Home Medications    Prior to Admission medications   Medication Sig Start Date End Date Taking? Authorizing Provider  ALPRAZolam Duanne Moron) 1 MG tablet Take 1 tablet (1 mg total) by mouth 3 (three) times daily. 09/16/18   Bayard Hugger, NP  amoxicillin-clavulanate (AUGMENTIN) 875-125 MG tablet Take 1 tablet by mouth every 12 (twelve) hours. 04/26/21   Jaynee Eagles, PA-C  baclofen (LIORESAL) 10 MG tablet Take 1 tablet (10 mg total) by mouth 3 (three) times daily as needed for muscle spasms. 10/20/18   Meredith Staggers, MD  divalproex (DEPAKOTE ER) 500 MG 24 hr tablet Take 1,000 mg by mouth every evening.    [provider]  DULoxetine (CYMBALTA) 60 MG capsule Take 1 capsule (60 mg total) by mouth daily. 08/03/18   Bayard Hugger, NP  hydrochlorothiazide (HYDRODIURIL) 12.5 MG tablet Take 12.5 mg by mouth daily.  06/19/15   [provider]  HYDROcodone-acetaminophen (NORCO/VICODIN) 5-325 MG tablet Take 1 tablet by mouth every 6 (six) hours as needed for severe pain. 04/26/21   Jaynee Eagles, PA-C  lidocaine (LIDODERM) 5 % PLACE 1 PATCH ONTO THE SKIN ONCE DAILY. REMOVE AFTER 12 HOURS AND LEAVE OFF UNTIL THE NEXT DAY. 09/30/17   Bayard Hugger, NP  lidocaine (XYLOCAINE) 2 % solution Use as directed 15 mLs in the mouth or throat as needed for mouth pain. 04/26/21   Jaynee Eagles, PA-C  Melatonin 1 MG TABS Take 1 mg by mouth at bedtime.    [provider]  mirabegron ER (MYRBETRIQ) 50 MG TB24 tablet Take 1 tablet (50 mg total) by mouth daily. 02/11/21   Bjorn Loser, MD  nitroGLYCERIN (NITROSTAT) 0.4 MG SL tablet Place 0.4 mg under the tongue every 5 (five) minutes as needed for chest pain.  06/11/15   [provider]   polyethylene glycol (MIRALAX / GLYCOLAX) packet Take 17 g by mouth daily as needed for mild constipation.    [provider]  sennosides-docusate sodium (SENOKOT-S) 8.6-50 MG tablet Take 2 tablets by mouth daily. 07/15/16   Marchia Bond, MD  TOVIAZ 8 MG TB24 tablet  02/09/17   [provider]    Family History Family History  Problem Relation Age of Onset   Schizophrenia Mother    Hypertension Mother    Alzheimer's disease Mother    Stroke Father    Diabetes Other    Hypertension Other     Social History Social History   Tobacco Use   Smoking status: Every Day    Packs/day: 1.00  Years: 36.00    Pack years: 36.00    Types: E-cigarettes, Cigarettes   Smokeless tobacco: Never  Substance Use Topics   Alcohol use: Yes    Alcohol/week: 1.0 standard drink    Types: 1 Shots of liquor per week    Comment: occasional   Drug use: No    Types: Morphine, Oxycodone    Comment: goes to pain clinic     Allergies   Aripiprazole, Bupropion, Lurasidone hcl, and Naproxen   Review of Systems Review of Systems Per HPI  Physical Exam Triage Vital Signs ED Triage Vitals  Enc Vitals Group     BP 10/31/21 0825 112/72     Pulse Rate 10/31/21 0825 88     Resp 10/31/21 0825 16     Temp 10/31/21 0825 98.3 F (36.8 C)     Temp Source 10/31/21 0825 Oral     SpO2 10/31/21 0825 96 %     Weight --      Height --      Head Circumference --      Peak Flow --      Pain Score 10/31/21 0826 8     Pain Loc --      Pain Edu? --      Excl. in [place]? --    No data found.  Updated Vital Signs BP 112/72 (BP Location: Right Arm)    Pulse 88    Temp 98.3 F (36.8 C) (Oral)    Resp 16    SpO2 96%   Visual Acuity Right Eye Distance:   Left Eye Distance:   Bilateral Distance:    Right Eye Near:   Left Eye Near:    Bilateral Near:     Physical Exam Constitutional:      General: She is not in acute distress.    Appearance: Normal appearance. She is not  toxic-appearing or diaphoretic.  HENT:     Head: Normocephalic and atraumatic.  Eyes:     Extraocular Movements: Extraocular movements intact.     Conjunctiva/sclera: Conjunctivae normal.  Cardiovascular:     Rate and Rhythm: Normal rate and regular rhythm.     Pulses: Normal pulses.     Heart sounds: Normal heart sounds.  Pulmonary:     Effort: Pulmonary effort is normal. No respiratory distress.     Breath sounds: Normal breath sounds.  Musculoskeletal:     Right hip: Tenderness present. No deformity, lacerations or crepitus. Normal range of motion. Normal strength.     Left hip: Normal.       Legs:     Comments: Tenderness to palpation throughout right groin.  Also has tenderness to palpation to right lateral hip.  No obvious bruising, lacerations, erythema noted.  Patient does have full range of motion of hip but with pain.  Neurovascular intact.  Neurological:     General: No focal deficit present.     Mental Status: She is alert and oriented to person, place, and time. Mental status is at baseline.  Psychiatric:        Mood and Affect: Mood normal.        Behavior: Behavior normal.        Thought Content: Thought content normal.        Judgment: Judgment normal.     UC Treatments / Results  Labs (all labs ordered are listed, but only abnormal results are displayed) Labs Reviewed - No data to display  EKG   Radiology DG Hip Unilat  With Pelvis 2-3 Views Right  Result Date: 10/31/2021 CLINICAL DATA:  Right hip pain EXAM: DG HIP (WITH OR WITHOUT PELVIS) 2-3V RIGHT COMPARISON:  None. FINDINGS: There is no evidence of hip fracture or dislocation. There is no evidence of arthropathy or other focal bone abnormality. IMPRESSION: Negative. Electronically Signed   By: Keane Police D.O.   On: 10/31/2021 09:14    Procedures Procedures (including critical care time)  Medications Ordered in UC Medications - No data to display  Initial Impression / Assessment and Plan / UC  Course  I have reviewed the triage vital signs and the nursing notes.  Pertinent labs & imaging results that were available during my care of the patient were reviewed by me and considered in my medical decision making (see chart for details).     Right hip x-ray was negative for any acute bony abnormality.  Patient will need to follow-up with orthopedist for further evaluation and management.  Same plan as yesterday.  Discussed strict return precautions.  Patient verbalized understanding and was agreeable with plan. Final Clinical Impressions(s) / UC Diagnoses   Final diagnoses:  Hip pain  Right hip pain     Discharge Instructions      Your x-ray was normal.  Please follow-up with orthopedist for further evaluation and management.     ED Prescriptions   None    PDMP not reviewed this encounter.   Teodora Medici, [place] 10/31/21 [phone]

## 2021-10-31 NOTE — ED Triage Notes (Signed)
Here yesterday for same complaint. Requesting right hip imaging

## 2021-11-06 ENCOUNTER — Ambulatory Visit (HOSPITAL_BASED_OUTPATIENT_CLINIC_OR_DEPARTMENT_OTHER): Payer: Medicare PPO | Admitting: Orthopaedic Surgery

## 2021-11-27 ENCOUNTER — Ambulatory Visit (HOSPITAL_BASED_OUTPATIENT_CLINIC_OR_DEPARTMENT_OTHER): Payer: Medicare PPO | Admitting: Orthopaedic Surgery

## 2021-12-05 DIAGNOSIS — K529 Noninfective gastroenteritis and colitis, unspecified: Secondary | ICD-10-CM | POA: Diagnosis not present

## 2021-12-11 DIAGNOSIS — E1159 Type 2 diabetes mellitus with other circulatory complications: Secondary | ICD-10-CM | POA: Diagnosis not present

## 2021-12-11 DIAGNOSIS — N390 Urinary tract infection, site not specified: Secondary | ICD-10-CM | POA: Diagnosis not present

## 2021-12-11 DIAGNOSIS — I152 Hypertension secondary to endocrine disorders: Secondary | ICD-10-CM | POA: Diagnosis not present

## 2021-12-11 DIAGNOSIS — Z79899 Other long term (current) drug therapy: Secondary | ICD-10-CM | POA: Diagnosis not present

## 2021-12-11 DIAGNOSIS — E1169 Type 2 diabetes mellitus with other specified complication: Secondary | ICD-10-CM | POA: Diagnosis not present

## 2021-12-11 DIAGNOSIS — I10 Essential (primary) hypertension: Secondary | ICD-10-CM | POA: Diagnosis not present

## 2021-12-23 DIAGNOSIS — J302 Other seasonal allergic rhinitis: Secondary | ICD-10-CM | POA: Diagnosis not present

## 2021-12-23 DIAGNOSIS — G43001 Migraine without aura, not intractable, with status migrainosus: Secondary | ICD-10-CM | POA: Diagnosis not present

## 2022-02-03 DIAGNOSIS — R519 Headache, unspecified: Secondary | ICD-10-CM | POA: Diagnosis not present

## 2022-02-03 DIAGNOSIS — G43001 Migraine without aura, not intractable, with status migrainosus: Secondary | ICD-10-CM | POA: Diagnosis not present

## 2022-02-25 DIAGNOSIS — G4709 Other insomnia: Secondary | ICD-10-CM | POA: Diagnosis not present

## 2022-02-25 DIAGNOSIS — I152 Hypertension secondary to endocrine disorders: Secondary | ICD-10-CM | POA: Diagnosis not present

## 2022-02-25 DIAGNOSIS — E1159 Type 2 diabetes mellitus with other circulatory complications: Secondary | ICD-10-CM | POA: Diagnosis not present

## 2022-02-25 DIAGNOSIS — Z1211 Encounter for screening for malignant neoplasm of colon: Secondary | ICD-10-CM | POA: Diagnosis not present

## 2022-02-25 DIAGNOSIS — M797 Fibromyalgia: Secondary | ICD-10-CM | POA: Diagnosis not present

## 2022-02-25 DIAGNOSIS — Z1231 Encounter for screening mammogram for malignant neoplasm of breast: Secondary | ICD-10-CM | POA: Diagnosis not present

## 2022-02-25 DIAGNOSIS — E1169 Type 2 diabetes mellitus with other specified complication: Secondary | ICD-10-CM | POA: Diagnosis not present

## 2022-05-02 ENCOUNTER — Emergency Department (HOSPITAL_COMMUNITY)
Admission: EM | Admit: 2022-05-02 | Discharge: 2022-05-02 | Disposition: A | Discharge status: Home / Self Care | Payer: Medicare Other | Attending: Emergency Medicine | Admitting: Emergency Medicine

## 2022-05-02 ENCOUNTER — Other Ambulatory Visit: Payer: Self-pay

## 2022-05-02 ENCOUNTER — Emergency Department (HOSPITAL_COMMUNITY): Payer: Medicare Other

## 2022-05-02 DIAGNOSIS — J449 Chronic obstructive pulmonary disease, unspecified: Secondary | ICD-10-CM | POA: Diagnosis not present

## 2022-05-02 DIAGNOSIS — K5900 Constipation, unspecified: Secondary | ICD-10-CM | POA: Diagnosis not present

## 2022-05-02 DIAGNOSIS — I10 Essential (primary) hypertension: Secondary | ICD-10-CM | POA: Insufficient documentation

## 2022-05-02 DIAGNOSIS — R1031 Right lower quadrant pain: Secondary | ICD-10-CM | POA: Diagnosis present

## 2022-05-02 DIAGNOSIS — Z79899 Other long term (current) drug therapy: Secondary | ICD-10-CM | POA: Insufficient documentation

## 2022-05-02 LAB — URINALYSIS, ROUTINE W REFLEX MICROSCOPIC
Bacteria, UA: NONE SEEN
Bilirubin Urine: NEGATIVE
Glucose, UA: NEGATIVE mg/dL
Hgb urine dipstick: NEGATIVE
Ketones, ur: 5 mg/dL — AB
Leukocytes,Ua: NEGATIVE
Nitrite: NEGATIVE
Protein, ur: NEGATIVE mg/dL
Specific Gravity, Urine: 1.027 (ref 1.005–1.030)
pH: 5 (ref 5.0–8.0)

## 2022-05-02 LAB — CBC
HCT: 37.8 % (ref 36.0–46.0)
Hemoglobin: 12.5 g/dL (ref 12.0–15.0)
MCH: 28.9 pg (ref 26.0–34.0)
MCHC: 33.1 g/dL (ref 30.0–36.0)
MCV: 87.3 fL (ref 80.0–100.0)
Platelets: 339 10*3/uL (ref 150–400)
RBC: 4.33 MIL/uL (ref 3.87–5.11)
RDW: 14.2 % (ref 11.5–15.5)
WBC: 7.8 10*3/uL (ref 4.0–10.5)
nRBC: 0 % (ref 0.0–0.2)

## 2022-05-02 LAB — COMPREHENSIVE METABOLIC PANEL
ALT: 21 U/L (ref 0–44)
AST: 25 U/L (ref 15–41)
Albumin: 3.8 g/dL (ref 3.5–5.0)
Alkaline Phosphatase: 52 U/L (ref 38–126)
Anion gap: 9 (ref 5–15)
BUN: 30 mg/dL — ABNORMAL HIGH (ref 6–20)
CO2: 26 mmol/L (ref 22–32)
Calcium: 10.3 mg/dL (ref 8.9–10.3)
Chloride: 107 mmol/L (ref 98–111)
Creatinine, Ser: 0.88 mg/dL (ref 0.44–1.00)
GFR, Estimated: >60 mL/min (ref >60)
Glucose, Bld: 87 mg/dL (ref 70–99)
Potassium: 3.7 mmol/L (ref 3.5–5.1)
Sodium: 142 mmol/L (ref 135–145)
Total Bilirubin: 0.4 mg/dL (ref 0.3–1.2)
Total Protein: 7.7 g/dL (ref 6.5–8.1)

## 2022-05-02 LAB — LIPASE, BLOOD: Lipase: 48 U/L (ref 11–51)

## 2022-05-02 MED ORDER — SORBITOL 70 % SOLN
960.0000 mL | TOPICAL_OIL | Freq: Once | ORAL | Status: AC
  Administered 2022-05-02: 960 mL via RECTAL
  Filled 2022-05-02: qty 473

## 2022-05-02 NOTE — ED Triage Notes (Signed)
Patient coming to ED for evaluation of abdominal pain.  Reports last BM was on Tuesday.  Having nausea.  No urinary symptoms or fevers reported.

## 2022-05-02 NOTE — Discharge Instructions (Signed)
You were seen today for constipation.  Please continue to take MiraLAX and Dulcolax at home until your bowel movements become more regular.  I recommend following up with primary care to discuss further treatment for hemorrhoids and further investigation as to the cause of your constipation.

## 2022-05-02 NOTE — ED Provider Notes (Signed)
[place] DEPT Provider Note   CSN: [id number] Arrival date & time: 05/02/22  3546     History  Chief Complaint  Patient presents with   Abdominal Pain    [name] is a 56 y.o. female.  Patient presents to the hospital complaining of abdominal pain, to be constipation.  Patient states that she had a very small bowel movement on Tuesday and has been finding constipation over the past week.  She denies fevers, urinary symptoms, vomiting.  The patient states that over the past week she has tried Dulcolax, MiraLAX, and an enema at home.  She states that she has had small bowel movements.  She states that she has a history of IBS but does not usually have constipation.  Past medical history significant for fibromyalgia, obesity, peptic ulcer disease, COPD, bipolar 1 disorder, hypertension, GERD, anxiety, abdominal hysterectomy  HPI     Home Medications Prior to Admission medications   Medication Sig Start Date End Date Taking? Authorizing Provider  ALPRAZolam Duanne Moron) 1 MG tablet Take 1 tablet (1 mg total) by mouth 3 (three) times daily. 09/16/18   Bayard Hugger, NP  amoxicillin-clavulanate (AUGMENTIN) 875-125 MG tablet Take 1 tablet by mouth every 12 (twelve) hours. 04/26/21   Jaynee Eagles, PA-C  baclofen (LIORESAL) 10 MG tablet Take 1 tablet (10 mg total) by mouth 3 (three) times daily as needed for muscle spasms. 10/20/18   Meredith Staggers, MD  divalproex (DEPAKOTE ER) 500 MG 24 hr tablet Take 1,000 mg by mouth every evening.    [provider]  DULoxetine (CYMBALTA) 60 MG capsule Take 1 capsule (60 mg total) by mouth daily. 08/03/18   Bayard Hugger, NP  hydrochlorothiazide (HYDRODIURIL) 12.5 MG tablet Take 12.5 mg by mouth daily.  06/19/15   [provider]  HYDROcodone-acetaminophen (NORCO/VICODIN) 5-325 MG tablet Take 1 tablet by mouth every 6 (six) hours as needed for severe pain. 04/26/21   Jaynee Eagles, PA-C  lidocaine  (LIDODERM) 5 % PLACE 1 PATCH ONTO THE SKIN ONCE DAILY. REMOVE AFTER 12 HOURS AND LEAVE OFF UNTIL THE NEXT DAY. 09/30/17   Bayard Hugger, NP  lidocaine (XYLOCAINE) 2 % solution Use as directed 15 mLs in the mouth or throat as needed for mouth pain. 04/26/21   Jaynee Eagles, PA-C  Melatonin 1 MG TABS Take 1 mg by mouth at bedtime.    [provider]  mirabegron ER (MYRBETRIQ) 50 MG TB24 tablet Take 1 tablet (50 mg total) by mouth daily. 02/11/21   Bjorn Loser, MD  nitroGLYCERIN (NITROSTAT) 0.4 MG SL tablet Place 0.4 mg under the tongue every 5 (five) minutes as needed for chest pain.  06/11/15   [provider]  polyethylene glycol (MIRALAX / GLYCOLAX) packet Take 17 g by mouth daily as needed for mild constipation.    [provider]  sennosides-docusate sodium (SENOKOT-S) 8.6-50 MG tablet Take 2 tablets by mouth daily. 07/15/16   Marchia Bond, MD  TOVIAZ 8 MG TB24 tablet  02/09/17   [provider]      Allergies    Aripiprazole, Bupropion, Lurasidone hcl, and Naproxen    Review of Systems   Review of Systems  Respiratory:  Negative for shortness of breath.   Cardiovascular:  Negative for chest pain.  Gastrointestinal:  Positive for abdominal pain, constipation and nausea. Negative for diarrhea and vomiting.    Physical Exam Updated Vital Signs BP (!) 146/104   Pulse 78   Temp  98.1 F (36.7 C) (Oral)   Resp 18   Ht '5\' 2"'$  (1.575 m)   Wt 113.4 kg   SpO2 100%   BMI 45.73 kg/m  Physical Exam Vitals and nursing note reviewed.  Constitutional:      General: She is not in acute distress. HENT:     Head: Normocephalic and atraumatic.  Eyes:     Pupils: Pupils are equal, round, and reactive to light.  Cardiovascular:     Rate and Rhythm: Normal rate and regular rhythm.     Heart sounds: Normal heart sounds.  Pulmonary:     Effort: Pulmonary effort is normal.     Breath sounds: Normal breath sounds.  Abdominal:     General: Abdomen is flat.  Bowel sounds are normal.     Palpations: Abdomen is soft.     Tenderness: There is abdominal tenderness (Mild tenderness across the lower abdomen) in the right lower quadrant, suprapubic area and left lower quadrant.  Skin:    General: Skin is warm and dry.     Capillary Refill: Capillary refill takes less than 2 seconds.  Neurological:     Mental Status: She is alert and oriented to person, place, and time.     ED Results / Procedures / Treatments   Labs (all labs ordered are listed, but only abnormal results are displayed) Labs Reviewed  COMPREHENSIVE METABOLIC PANEL - Abnormal; Notable for the following components:      Result Value   BUN 30 (*)    All other components within normal limits  URINALYSIS, ROUTINE W REFLEX MICROSCOPIC - Abnormal; Notable for the following components:   Color, Urine AMBER (*)    APPearance HAZY (*)    Ketones, ur 5 (*)    All other components within normal limits  LIPASE, BLOOD  CBC    EKG None  Radiology DG Abdomen 1 View  Result Date: 05/02/2022 CLINICAL DATA:  Constipation generalized abdominal pain EXAM: ABDOMEN - 1 VIEW COMPARISON:  None Available. FINDINGS: The bowel gas pattern is nonobstructive. Small to moderate stool burden in the colon rectum. No radio-opaque calculi or other significant radiographic abnormality are seen. Mild lumbar spondylosis. IMPRESSION: 1.  Bowel-gas pattern is nonobstructive. 2.  Small to moderate stool burden in the colon and rectum. Electronically Signed   By: Frazier Richards M.D.   On: 05/02/2022 08:21    Procedures Procedures    Medications Ordered in ED Medications  sorbitol, milk of mag, mineral oil, glycerin (SMOG) enema (960 mLs Rectal Given 05/02/22 6578)    ED Course/ Medical Decision Making/ A&P                           Medical Decision Making Amount and/or Complexity of Data Reviewed Labs: ordered. Radiology: ordered.   Patient present the chief complaint of abdominal pain.  Differential  includes constipation, appendicitis, cholecystitis, pancreatitis, gastroenteritis, and others  I reviewed patient's past medical history including charts from primary care showing treatment for diabetes mellitus and other chronic conditions.  I ordered and reviewed labs.  Pertinent results include mildly elevated BUN, normal lipase, normal CBC, urinalysis with no indication of infection  I ordered and interpreted imaging including a KUB.  Nonobstructive bowel gas pattern.  Small to moderate stool burden in the colon and rectum.  I ordered the patient a smog enema for constipation.  Upon reassessment the patient had a large bowel movement.  The patient has no focal  tenderness suggestive of cholecystitis or appendicitis.  Her lipase does not indicate pancreatitis.  She has had no diarrhea or vomiting suggestive of gastroenteritis or viral process.  Nonobstructive bowel gas pattern on x-ray.  This appears to be constipation.  She states she feels much better after the bowel movement she had after the smog enema.  There was a small amount of streaky bright red blood mixed in with her stool.  I feel this is likely due to potential hemorrhoids.  She plans to follow-up with her primary care provider for this as she has a history of hemorrhoids and has previously been treated by general surgery.  She is stable to discharge home at this time.  I do recommend that she continue to take MiraLAX and stool softeners until her bowel movements become more regular.        Final Clinical Impression(s) / ED Diagnoses Final diagnoses:  Constipation, unspecified constipation type    Rx / DC Orders ED Discharge Orders     None         Ronny Bacon 05/02/22 0947    Lajean Saver, MD 05/02/22 1038

## 2024-02-12 ENCOUNTER — Emergency Department (HOSPITAL_BASED_OUTPATIENT_CLINIC_OR_DEPARTMENT_OTHER): Admitting: Radiology

## 2024-02-12 ENCOUNTER — Other Ambulatory Visit: Payer: Self-pay

## 2024-02-12 ENCOUNTER — Encounter (HOSPITAL_BASED_OUTPATIENT_CLINIC_OR_DEPARTMENT_OTHER): Payer: Self-pay

## 2024-02-12 ENCOUNTER — Emergency Department (HOSPITAL_BASED_OUTPATIENT_CLINIC_OR_DEPARTMENT_OTHER)
Admission: EM | Admit: 2024-02-12 | Discharge: 2024-02-12 | Disposition: A | Discharge status: Home / Self Care | Attending: Emergency Medicine | Admitting: Emergency Medicine

## 2024-02-12 ENCOUNTER — Emergency Department (HOSPITAL_BASED_OUTPATIENT_CLINIC_OR_DEPARTMENT_OTHER)

## 2024-02-12 DIAGNOSIS — R0602 Shortness of breath: Secondary | ICD-10-CM | POA: Diagnosis not present

## 2024-02-12 DIAGNOSIS — J449 Chronic obstructive pulmonary disease, unspecified: Secondary | ICD-10-CM | POA: Insufficient documentation

## 2024-02-12 DIAGNOSIS — R0981 Nasal congestion: Secondary | ICD-10-CM | POA: Insufficient documentation

## 2024-02-12 DIAGNOSIS — R000 Tachycardia, unspecified: Secondary | ICD-10-CM | POA: Diagnosis not present

## 2024-02-12 DIAGNOSIS — Z79899 Other long term (current) drug therapy: Secondary | ICD-10-CM | POA: Diagnosis not present

## 2024-02-12 DIAGNOSIS — R0789 Other chest pain: Secondary | ICD-10-CM

## 2024-02-12 DIAGNOSIS — R911 Solitary pulmonary nodule: Secondary | ICD-10-CM | POA: Insufficient documentation

## 2024-02-12 DIAGNOSIS — R059 Cough, unspecified: Secondary | ICD-10-CM | POA: Diagnosis not present

## 2024-02-12 DIAGNOSIS — I10 Essential (primary) hypertension: Secondary | ICD-10-CM | POA: Diagnosis not present

## 2024-02-12 LAB — BASIC METABOLIC PANEL WITH GFR
Anion gap: 12 (ref 5–15)
BUN: 27 mg/dL — ABNORMAL HIGH (ref 6–20)
CO2: 23 mmol/L (ref 22–32)
Calcium: 10.3 mg/dL (ref 8.9–10.3)
Chloride: 102 mmol/L (ref 98–111)
Creatinine, Ser: 1.04 mg/dL — ABNORMAL HIGH (ref 0.44–1.00)
GFR, Estimated: >60 mL/min (ref >60)
Glucose, Bld: 111 mg/dL — ABNORMAL HIGH (ref 70–99)
Potassium: 4.4 mmol/L (ref 3.5–5.1)
Sodium: 137 mmol/L (ref 135–145)

## 2024-02-12 LAB — CBC
HCT: 36.9 % (ref 36.0–46.0)
Hemoglobin: 12.3 g/dL (ref 12.0–15.0)
MCH: 28 pg (ref 26.0–34.0)
MCHC: 33.3 g/dL (ref 30.0–36.0)
MCV: 84.1 fL (ref 80.0–100.0)
Platelets: 367 10*3/uL (ref 150–400)
RBC: 4.39 MIL/uL (ref 3.87–5.11)
RDW: 14.6 % (ref 11.5–15.5)
WBC: 9.2 10*3/uL (ref 4.0–10.5)
nRBC: 0 % (ref 0.0–0.2)

## 2024-02-12 LAB — TROPONIN T, HIGH SENSITIVITY
Troponin T High Sensitivity: <15 ng/L (ref <19)
Troponin T High Sensitivity: <15 ng/L (ref <19)

## 2024-02-12 LAB — RESP PANEL BY RT-PCR (RSV, FLU A&B, COVID)  RVPGX2
Influenza A by PCR: NEGATIVE
Influenza B by PCR: NEGATIVE
Resp Syncytial Virus by PCR: NEGATIVE
SARS Coronavirus 2 by RT PCR: NEGATIVE

## 2024-02-12 LAB — D-DIMER, QUANTITATIVE: D-Dimer, Quant: 0.74 ug{FEU}/mL — ABNORMAL HIGH (ref 0.00–0.50)

## 2024-02-12 MED ORDER — IOHEXOL 350 MG/ML SOLN
75.0000 mL | Freq: Once | INTRAVENOUS | Status: AC | PRN
  Administered 2024-02-12: 75 mL via INTRAVENOUS

## 2024-02-12 NOTE — Discharge Instructions (Addendum)
 You were seen today for chest pain.  Your workup is reassuring.  Follow-up with cardiology.  You need to stop vaping.  Of note, you did have a nodule on your CT scan.  You need to repeat CT scan in 6 to 12 months to further evaluate the pulmonary nodule for stability.

## 2024-02-12 NOTE — ED Provider Notes (Signed)
 Received patient in turnover from Dr. Carylon Claude.  Please see their note for further details of Hx, PE.  Briefly patient is a 58 y.o. female with a Chest Pain .  Atypical in nature.  Initial troponin negative.  Awaiting second troponin.  This has resulted and is also negative.  Will discharge the patient home.  PCP and cardiology follow-up.  Incidental lung nodule found on CT scan.Inga Manges, Genella Kendall, DO 02/12/24 [phone]

## 2024-02-12 NOTE — ED Notes (Signed)
 Discharge paperwork given and verbally understood.

## 2024-02-12 NOTE — ED Triage Notes (Signed)
 Patient states she has been sick for 3 weeks and has been prescribed medication for it from her PCP. States she woke up 3 times tonight gasping for air. States this happed a few years ago. Reports chest pain 5/10 on the left side of the chest. Reports vomiting 2 days ago. Patient was prescribed phenergan . Denies blood thinners.

## 2024-02-12 NOTE — ED Notes (Signed)
 Patient seen by RT. Clear lung sounds in all fields.

## 2024-02-12 NOTE — ED Provider Notes (Signed)
 [place] [facility]MENT AT [facility] Provider Note   CSN: [id number] Arrival date & time: 02/12/24  0413     History  Chief Complaint  Patient presents with   Chest Pain    [name] is a 58 y.o. female.  HPI     This is a 58 year old female who presents with chest pain and shortness of breath.  Patient reports that she woke up this morning gasping for air several times.  This is never happened to her before.  She has no history of sleep apnea.  She states preceding this she has been sick for the last 2 to 3 weeks with upper respiratory symptoms including cough and congestion.  She has been treated by her primary care doctor for pneumonia.  This morning she developed anterior chest pain when she woke up.  It is 5 out of 10.  No radiation of the pain.  Home Medications Prior to Admission medications   Medication Sig Start Date End Date Taking? Authorizing Provider  ALPRAZolam  (XANAX ) 1 MG tablet Take 1 tablet (1 mg total) by mouth 3 (three) times daily. 09/16/18   Jodi Munroe, NP  amoxicillin -clavulanate (AUGMENTIN ) 875-125 MG tablet Take 1 tablet by mouth every 12 (twelve) hours. 04/26/21   Adolph Hoop, PA-C  baclofen  (LIORESAL ) 10 MG tablet Take 1 tablet (10 mg total) by mouth 3 (three) times daily as needed for muscle spasms. 10/20/18   Rawland Caddy, MD  divalproex  (DEPAKOTE  ER) 500 MG 24 hr tablet Take 1,000 mg by mouth every evening.    [provider]  DULoxetine  (CYMBALTA ) 60 MG capsule Take 1 capsule (60 mg total) by mouth daily. 08/03/18   Jodi Munroe, NP  hydrochlorothiazide  (HYDRODIURIL ) 12.5 MG tablet Take 12.5 mg by mouth daily.  06/19/15   [provider]  HYDROcodone -acetaminophen  (NORCO/VICODIN) 5-325 MG tablet Take 1 tablet by mouth every 6 (six) hours as needed for severe pain. 04/26/21   Adolph Hoop, PA-C  lidocaine  (LIDODERM ) 5 % PLACE 1 PATCH ONTO THE SKIN ONCE DAILY. REMOVE AFTER 12 HOURS AND LEAVE OFF UNTIL THE  NEXT DAY. 09/30/17   Jodi Munroe, NP  lidocaine  (XYLOCAINE ) 2 % solution Use as directed 15 mLs in the mouth or throat as needed for mouth pain. 04/26/21   Adolph Hoop, PA-C  Melatonin 1 MG TABS Take 1 mg by mouth at bedtime.    [provider]  mirabegron  ER (MYRBETRIQ ) 50 MG TB24 tablet Take 1 tablet (50 mg total) by mouth daily. 02/11/21   Erman Hayward, MD  nitroGLYCERIN  (NITROSTAT ) 0.4 MG SL tablet Place 0.4 mg under the tongue every 5 (five) minutes as needed for chest pain.  06/11/15   [provider]  polyethylene glycol (MIRALAX  / GLYCOLAX ) packet Take 17 g by mouth daily as needed for mild constipation.    [provider]  sennosides-docusate sodium  (SENOKOT-S) 8.6-50 MG tablet Take 2 tablets by mouth daily. 07/15/16   Osa Blase, MD  TOVIAZ 8 MG TB24 tablet  02/09/17   [provider]      Allergies    Aripiprazole, Bupropion, Lurasidone hcl, and Naproxen    Review of Systems   Review of Systems  Constitutional:  Positive for chills. Negative for fever.  HENT:  Positive for congestion.   Respiratory:  Positive for shortness of breath.   Cardiovascular:  Positive for chest pain.  Gastrointestinal:  Positive for nausea and vomiting. Negative for abdominal pain.  All other systems  reviewed and are negative.   Physical Exam Updated Vital Signs BP 107/80   Pulse 100   Temp (!) 97.5 F (36.4 C)   Resp 11   Ht 1.575 m (5\' 2" )   Wt 118.8 kg   SpO2 96%   BMI 47.92 kg/m  Physical Exam Vitals and nursing note reviewed.  Constitutional:      Appearance: She is well-developed. She is obese.  HENT:     Head: Normocephalic and atraumatic.  Eyes:     Pupils: Pupils are equal, round, and reactive to light.  Cardiovascular:     Rate and Rhythm: Regular rhythm. Tachycardia present.     Heart sounds: Normal heart sounds.  Pulmonary:     Effort: Pulmonary effort is normal. No respiratory distress.     Breath sounds: No wheezing.   Abdominal:     General: Bowel sounds are normal.     Palpations: Abdomen is soft.  Musculoskeletal:     Cervical back: Neck supple.     Right lower leg: No edema.     Left lower leg: No edema.  Skin:    General: Skin is warm and dry.  Neurological:     Mental Status: She is alert and oriented to person, place, and time.  Psychiatric:        Mood and Affect: Mood normal.     ED Results / Procedures / Treatments   Labs (all labs ordered are listed, but only abnormal results are displayed) Labs Reviewed  BASIC METABOLIC PANEL WITH GFR - Abnormal; Notable for the following components:      Result Value   Glucose, Bld 111 (*)    BUN 27 (*)    Creatinine, Ser 1.04 (*)    All other components within normal limits  D-DIMER, QUANTITATIVE - Abnormal; Notable for the following components:   D-Dimer, Quant 0.74 (*)    All other components within normal limits  RESP PANEL BY RT-PCR (RSV, FLU A&B, COVID)  RVPGX2  CBC  TROPONIN T, HIGH SENSITIVITY  TROPONIN T, HIGH SENSITIVITY    EKG EKG Interpretation Date/Time:  Friday Feb 12 2024 04:26:34 EDT Ventricular Rate:  106 PR Interval:  129 QRS Duration:  103 QT Interval:  371 QTC Calculation: 493 R Axis:   33  Text Interpretation: Sinus tachycardia Abnormal R-wave progression, early transition Borderline repolarization abnormality Borderline prolonged QT interval Confirmed by Donita Furrow ([id number]) on 02/12/2024 4:28:09 AM  Radiology CT Angio Chest PE W and/or Wo Contrast Result Date: 02/12/2024 CLINICAL DATA:  Sick for the past 3 weeks, today with increasing dyspnea and respiratory distress. Left-sided chest pain also. EXAM: CT ANGIOGRAPHY CHEST WITH CONTRAST TECHNIQUE: Multidetector CT imaging of the chest was performed using the standard protocol during bolus administration of intravenous contrast. Multiplanar CT image reconstructions and MIPs were obtained to evaluate the vascular anatomy. RADIATION DOSE REDUCTION: This exam was  performed according to the departmental dose-optimization program which includes automated exposure control, adjustment of the mA and/or kV according to patient size and/or use of iterative reconstruction technique. CONTRAST:  75mL OMNIPAQUE  IOHEXOL  350 MG/ML SOLN COMPARISON:  PA and lateral chest today, AP and lateral chest 07/17/2016, and CTA chest 03/05/2016. FINDINGS: Cardiovascular: The cardiac size is upper limits of normal. There is trace calcification in the left main and circumflex coronary arteries. No other visible coronary calcification. The pulmonary trunk is chronically prominent, 3.1 cm indicating arterial hypertension. Arterial opacification is diagnostic to the segmental level. No embolus is seen to this  level. The subsegmental arteries are unopacified due to bolus timing and not evaluated. There is mild aortic tortuosity and atherosclerosis, normal opacification of the great vessels, and no aneurysm, stenosis or dissection. The pulmonary veins are normal caliber.  No pericardial effusion. Mediastinum/Nodes: No enlarged mediastinal, hilar, or axillary lymph nodes. Thyroid gland, trachea, and esophagus demonstrate no significant findings. Lungs/Pleura: There is a new 8 mm subpleural nodule posteriorly in the right lower lobe on 6:89. No other nodules are seen. Possible this could be nodular atelectasis but indeterminate. There is right lower lobe paraspinal scarring which was noted previously. There is mild posterior atelectasis in both lungs. There is no consolidation, effusion or pneumothorax. Upper Abdomen: There is mild-to-moderate hepatic steatosis. No acute upper abdominal findings. Musculoskeletal: Degenerative change and chronic endplate irregularities thoracic spine. The acute or significant osseous findings. Left shoulder arthroplasty with streak artifact is again shown. No mass in the visualized chest. Review of the MIP images confirms the above findings. IMPRESSION: 1. No arterial embolus  to the segmental level. The subsegmental arteries are unopacified due to bolus timing and not evaluated. 2. No other evidence of acute chest process. 3. Aortic and coronary artery atherosclerosis. 4. Chronically prominent pulmonary trunk 3.1 cm. 5. New 8 mm subpleural nodule posteriorly in the right lower lobe. Possible this could be nodular atelectasis but indeterminate. Per Fleischner Society Guidelines, recommend a non-contrast Chest CT at 6-12 months. If patient is high risk for malignancy, consider an additional non-contrast Chest CT at 18-24 months. If patient is low risk for malignancy, non-contrast Chest CT at 18-24 months is optional. These guidelines do not apply to immunocompromised patients and patients with cancer. Follow up in patients with significant comorbidities as clinically warranted. For lung cancer screening, adhere to Lung-RADS guidelines. Reference: Radiology. 2017; 284(1):228-43. 6. Hepatic steatosis. Aortic Atherosclerosis (ICD10-I70.0). Electronically Signed   By: Denman Fischer M.D.   On: 02/12/2024 05:43   DG Chest 2 View Result Date: 02/12/2024 CLINICAL DATA:  58 year old female with chest pain, shortness of breath. EXAM: CHEST - 2 VIEW COMPARISON:  Chest radiographs 07/17/2016 and earlier. FINDINGS: Chronic left shoulder arthroplasty. Lung volumes and mediastinal contours today appear normal. Visualized tracheal air column is within normal limits. Lung markings appear normal, both lungs appear clear. No pneumothorax or pleural effusion. Negative visible bowel gas. Incidental nipple piercings. No acute osseous abnormality identified. IMPRESSION: No cardiopulmonary abnormality identified. Electronically Signed   By: Marlise Simpers M.D.   On: 02/12/2024 05:00    Procedures Procedures    Medications Ordered in ED Medications  iohexol  (OMNIPAQUE ) 350 MG/ML injection 75 mL (75 mLs Intravenous Contrast Given 02/12/24 0510)    ED Course/ Medical Decision Making/ A&P                                  Medical Decision Making Amount and/or Complexity of Data Reviewed Labs: ordered. Radiology: ordered.  Risk Prescription drug management.   This patient presents to the ED for concern of an episode of gasping for breath and chest pain. , this involves an extensive number of treatment options, and is a complaint that carries with it a high risk of complications and morbidity.  I considered the following differential and admission for this acute, potentially life threatening condition.  The differential diagnosis includes ACS, PE, pneumothorax, pneumonia, sleep apnea  MDM:    This is a 59 year old female who presents with chest pain shortness of breath.  Woke up gasping for air.  She is nontoxic and vital signs are reassuring.  She does vape.  No significant wheezing on exam.  EKG shows no evidence of acute arrhythmia or ischemia.  Troponin x 1 negative.  She is tachycardic.  D-dimer was sent and was positive.  CT PE study was obtained.  No evidence of PE.  She does have pulmonary nodule which follow-up was recommended for.  She has remained hemodynamically stable.  Will repeat troponin for further restratification.  (Labs, imaging, consults)  Labs: I Ordered, and personally interpreted labs.  The pertinent results include: CBC, BMP, troponin x 2, D-dimer  Imaging Studies ordered: I ordered imaging studies including chest x-ray, CT PE I independently visualized and interpreted imaging. I agree with the radiologist interpretation  Additional history obtained from chart review.  External records from outside source obtained and reviewed including prior evaluations  Cardiac Monitoring: The patient was maintained on a cardiac monitor.  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 hyperthermia variant post op 03/07/2016   Primary localized osteoarthritis of left knee 03/04/2016   Primary localized osteoarthritis of right knee 07/15/2016   PUD (peptic ulcer disease)    Spinal stenosis      Medicines Meds ordered this encounter  Medications   iohexol  (OMNIPAQUE ) 350 MG/ML injection 75 mL    I have reviewed the patients home medicines and have made adjustments as needed  Problem List / ED Course: Problem List Items Addressed This Visit   None Visit Diagnoses       Pulmonary nodule    -  Primary                   Final Clinical Impression(s) / ED Diagnoses Final diagnoses:  Pulmonary nodule    Rx / DC Orders ED Discharge Orders     None         Rory Collard, MD 02/12/24 [phone]

## 2024-02-16 NOTE — Progress Notes (Signed)
 Cardiology Office Note:    Date:  02/19/2024   ID:  [name], DOB [birth year], MRN [id number]  PCP:  [facility]  Cardiologist:  None  Electrophysiologist:  None   Referring MD: Rory Collard, MD   Chief Complaint  Patient presents with   Chest Pain    History of Present Illness:    [name] is a 58 y.o. female with a hx of uterine cancer, COPD, fibromyalgia, hypertension, BPD who is referred by Dr. Carylon Claude for evaluation of chest pain.  She reports she has been having chest pain over the last few months, occurs when she is stressed.  Describes sharp stabbing pain in center of chest, can last up to 1 minute.  She does not exercise but does get short of breath with exertion.  She denies any lightheadedness, syncope, lower extremity edema.  She smoked 2 packs/day x 40 years, switched to vaping in 2020.  Family history includes paternal grandfather died of MI.  Echocardiogram in 2017 showed EF 65 to 70%, mild mitral regurgitation.  Coronary CTA in 2015 showed normal coronary arteries.    Past Medical History:  Diagnosis Date   Anemia    Anxiety    Bipolar 1 disorder (HCC)    Cancer (HCC)    uterine CA/- resulted in hysterectomy, pt. 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 hyperthermia variant post op 03/07/2016   Primary localized osteoarthritis of left knee 03/04/2016   Primary localized osteoarthritis of right knee 07/15/2016   PUD (peptic ulcer disease)    Spinal stenosis     Past Surgical History:  Procedure Laterality Date   ABDOMINAL HYSTERECTOMY  1998   CARDIOVASCULAR  STRESS TEST  03/2010   Lexiscan Myoview: EF 55%, mild breast attenuation, no ischemia or scar   DILATION AND CURETTAGE OF UTERUS  1986   "lost a son"   esophageal ulcer     JOINT REPLACEMENT     KNEE ARTHROSCOPY Right 2005   TONSILLECTOMY     TOTAL KNEE ARTHROPLASTY Left 03/04/2016   Procedure: TOTAL KNEE ARTHROPLASTY;  Surgeon: Osa Blase, MD;  Location: [facility] OR;  Service: Orthopedics;  Laterality: Left;   TOTAL KNEE ARTHROPLASTY Right 07/15/2016   Procedure: TOTAL KNEE ARTHROPLASTY;  Surgeon: Osa Blase, MD;  Location: [facility] OR;  Service: Orthopedics;  Laterality: Right;   TOTAL SHOULDER ARTHROPLASTY Left 07/17/2015   Procedure: TOTAL SHOULDER ARTHROPLASTY;  Surgeon: Osa Blase, MD;  Location: [facility] OR;  Service: Orthopedics;  Laterality: Left;   TRANSTHORACIC ECHOCARDIOGRAM  03/2010   EF 50-55%, normal LV size, no WMAs   TUBAL LIGATION  1990    Current Medications: No outpatient medications have been marked as taking for the 02/19/24 encounter (Office Visit) with Wendie Hamburg, MD.     Allergies:   Aripiprazole, Bupropion, Lurasidone hcl, and Naproxen   Social History   Socioeconomic History   Marital status: Divorced    Spouse name: Not on file   Number of children: Not on file   Years of  education: Not on file   Highest education level: Not on file  Occupational History   Not on file  Tobacco Use   Smoking status: Every Day    Current packs/day: 1.00    Average packs/day: 1 pack/day for 36.0 years (36.0 ttl pk-yrs)    Types: E-cigarettes, Cigarettes   Smokeless tobacco: Never  Substance and Sexual Activity   Alcohol  use: Yes    Alcohol /week: 1.0 standard drink of alcohol     Types: 1 Shots of liquor per week    Comment: occasional   Drug use: No    Types: Morphine , Oxycodone     Comment: goes to pain clinic   Sexual activity: Not on file  Other Topics Concern   Not on file  Social History Narrative   Not on file   Social Drivers of Health   Financial  Resource Strain: Low Risk  (11/25/2023)   Received from [facility]   Overall Financial Resource Strain (CARDIA)    Difficulty of Paying Living Expenses: Not hard at all  Food Insecurity: No Food Insecurity (11/25/2023)   Received from [facility]   Hunger Vital Sign    Worried About Running Out of Food in the Last Year: Never true    Ran Out of Food in the Last Year: Never true  Transportation Needs: No Transportation Needs (11/25/2023)   Received from [facility] - Transportation    Lack of Transportation (Medical): No    Lack of Transportation (Non-Medical): No  Physical Activity: Insufficiently Active (11/25/2023)   Received from [facility]   Exercise Vital Sign    Days of Exercise per Week: 3 days    Minutes of Exercise per Session: 30 min  Stress: No Stress Concern Present (11/25/2023)   Received from [facility] - Occupational Stress Questionnaire    Feeling of Stress : Not at all  Social Connections: Socially Integrated (11/25/2023)   Received from [facility]   Social Network    How would you rate your social network (family, work, friends)?: Good participation with social networks     Family History: The patient's family history includes Alzheimer's disease in her mother; Diabetes in an other family member; Hypertension in her mother and another family member; Schizophrenia in her mother; Stroke in her father.  ROS:   Please see the history of present illness.     All other systems reviewed and are negative.  EKGs/Labs/Other Studies Reviewed:    The following studies were reviewed today:   EKG:   02/19/2024: Normal sinus rhythm, heart rate 82, QTc 488, nonspecific T wave flattening  Recent Labs: 02/12/2024: BUN 27; Creatinine, Ser 1.04; Hemoglobin 12.3; Platelets 367; Potassium 4.4; Sodium 137  Recent Lipid Panel    Component Value Date/Time   CHOL 146 12/26/2013 0400   TRIG 151 (H) 12/26/2013 0400    HDL 45 12/26/2013 0400   CHOLHDL 3.2 12/26/2013 0400   VLDL 30 12/26/2013 0400   LDLCALC 71 12/26/2013 0400    Physical Exam:    VS:  BP 110/70   Pulse 99   Ht 5\' 2"  (1.575 m)   Wt 254 lb (115.2 kg)   SpO2 96%   BMI 46.46 kg/m     Wt Readings from Last 3 Encounters:  02/19/24 254 lb (115.2 kg)  02/12/24 262 lb (118.8 kg)  05/02/22 250 lb (113.4 kg)     GEN:  Well nourished, well developed in no acute distress  HEENT: Normal NECK: No JVD; No carotid bruits LYMPHATICS: No lymphadenopathy CARDIAC: RRR, no murmurs, rubs, gallops RESPIRATORY:  Clear to auscultation without rales, wheezing or rhonchi  ABDOMEN: Soft, non-tender, non-distended MUSCULOSKELETAL:  No edema; No deformity  SKIN: Warm and dry NEUROLOGIC:  Alert and oriented x 3 PSYCHIATRIC:  Normal affect   ASSESSMENT:    1. Chest pain of uncertain etiology   2. Snoring   3. Daytime somnolence   4. Hyperlipidemia, unspecified hyperlipidemia type   5. Essential hypertension   6. Morbid obesity (HCC)    PLAN:    Chest pain: Atypical in description but could represent angina as occurs with stress.  Also with dyspnea on exertion that could represent anginal equivalent.  Given BMI, not a good coronary CTA candidate.  Not a treadmill candidate.  Recommend stress PET to evaluate for ischemia.  Recommend echocardiogram to rule out structural heart disease  Hypertension: On hydrochlorothiazide  12.5 mg daily.  Check BMET/ magnesium   Tobacco use: Counseled on the risk of vaping and cessation recommended.  Reports she called 1800 QUIT NOW, has patches and lozenges and is working on cessation  Morbid obesity: Body mass index is 46.46 kg/m. Started Mounjaro.  Diet/exercise recommended  Snoring/observed apnea/daytime somnolence: Recommend Itamar sleep study.  STOP-BANG 6  Pulmonary nodule: Noted on recent CT chest.  Follow-up CT recommended in 6 months  QT prolongation: QTc 488.  Check electrolytes.  Avoid QT prolonging  agents  RTC in 3 months  Informed Consent   Shared Decision Making/Informed Consent The risks [chest pain, shortness of breath, cardiac arrhythmias, dizziness, blood pressure fluctuations, myocardial infarction, stroke/transient ischemic attack, nausea, vomiting, allergic reaction, radiation exposure, metallic taste sensation and life-threatening complications (estimated to be 1 in 10,000)], benefits (risk stratification, diagnosing coronary artery disease, treatment guidance) and alternatives of a cardiac PET stress test were discussed in detail with Ms. Lule and she agrees to proceed.       Medication Adjustments/Labs and Tests Ordered: Current medicines are reviewed at length with the patient today.  Concerns regarding medicines are outlined above.  Orders Placed This Encounter  Procedures   NM PET CT CARDIAC PERFUSION MULTI W/ABSOLUTE BLOODFLOW   Basic Metabolic Panel (BMET)   Magnesium    Lipid panel   EKG 12-Lead   ECHOCARDIOGRAM COMPLETE   Itamar Sleep Study   No orders of the defined types were placed in this encounter.   Patient Instructions  Medication Instructions:  Continue current medication *If you need a refill on your cardiac medications before your next appointment, please call your pharmacy*  Lab Work: Mg, bmp, lipid panel If you have labs (blood work) drawn today and your tests are completely normal, you will receive your results only by: MyChart Message (if you have MyChart) OR A paper copy in the mail If you have any lab test that is abnormal or we need to change your treatment, we will call you to review the results.  Testing/Procedures: Echo Your physician has requested that you have an echocardiogram. Echocardiography is a painless test that uses sound waves to create images of your heart. It provides your doctor with information about the size and shape of your heart and how well your heart's chambers and valves are working. This procedure takes  approximately one hour. There are no restrictions for this procedure. Please do NOT wear cologne, perfume, aftershave, or lotions (deodorant is allowed). Please arrive 15 minutes prior to your appointment time.  Please note: We ask at that you not bring  children with you during ultrasound (echo/ vascular) testing. Due to room size and safety concerns, children are not allowed in the ultrasound rooms during exams. Our front office staff cannot provide observation of children in our lobby area while testing is being conducted. An adult accompanying a patient to their appointment will only be allowed in the ultrasound room at the discretion of the ultrasound technician under special circumstances. We apologize for any inconvenience.   ITMAR \ WatchPAT?  Is a FDA cleared portable home sleep study test that uses a watch and 3 points of contact to monitor 7 different channels, including your heart rate, oxygen saturations, body position, snoring, and chest motion.  The study is easy to use from the comfort of your own home and accurately detect sleep apnea.  Before bed, you attach the chest sensor, attached the sleep apnea bracelet to your nondominant hand, and attach the finger probe.  After the study, the raw data is downloaded from the watch and scored for apnea events.   For more information: [email or web address]  Patient Testing Instructions:  Do not put battery into the device until bedtime when you are ready to begin the test. Please call the support number if you need assistance after following the instructions below: 24 hour support line- [phone] or ITAMAR support at [phone] (option 2)  Download the IntelWatchPAT One" app through the google play store or App Store  Be sure to turn on or enable access to bluetooth in settlings on your smartphone/ device  Make sure no other bluetooth devices are on and within the vicinity of your smartphone/ device and WatchPAT  watch during testing.  Make sure to leave your smart phone/ device plugged in and charging all night.  When ready for bed:  Follow the instructions step by step in the WatchPAT One App to activate the testing device. For additional instructions, including video instruction, visit the WatchPAT One video on Youtube. You can search for WatchPat One within Youtube (video is 4 minutes and 18 seconds) or enter: https://youtube/watch?[email or web address]_vbiwxE Please note: You will be prompted to enter a Pin to connect via bluetooth when starting the test. The PIN will be assigned to you when you receive the test.  The device is disposable, but it recommended that you retain the device until you receive a call letting you know the study has been received and the results have been interpreted.  We will let you know if the study did not transmit to us  properly after the test is completed. You do not need to call us  to confirm the receipt of the test.  Please complete the test within 48 hours of receiving PIN.   Frequently Asked Questions:  What is Watch Deatra Face one?  A single use fully disposable home sleep apnea testing device and will not need to be returned after completion.  What are the requirements to use WatchPAT one?  The be able to have a successful watchpat one sleep study, you should have your Watch pat one device, your smart phone, watch pat one app, your PIN number and Internet access What type of phone do I need?  You should have a smart phone that uses Android 5.1 and above or any Iphone with IOS 10 and above How can I download the WatchPAT one app?  Based on your device type search for WatchPAT one app either in google play for android devices or APP store for Iphone's Where will I get my PIN for the study?  Your  PIN will be provided by your physician's office. It is used for authentication and if you lose/forget your PIN, please reach out to your providers office.  I do not have Internet at home. Can I do  WatchPAT one study?  WatchPAT One needs Internet connection throughout the night to be able to transmit the sleep data. You can use your home/local internet or your cellular's data package. However, it is always recommended to use home/local Internet. It is estimated that between 20MB-30MB will be used with each study.However, the application will be looking for space in the phone to start the study.  What happens if I lose internet or bluetooth connection?  During the internet disconnection, your phone will not be able to transmit the sleep data. All the data, will be stored in your phone. As soon as the internet connection is back on, the phone will being sending the sleep data. During the bluetooth disconnection, WatchPAT one will not be able to to send the sleep data to your phone. Data will be kept in the WatchPAT one until two devices have bluetooth connection back on. As soon as the connection is back on, WatchPAT one will send the sleep data to the phone.  How long do I need to wear the WatchPAT one?  After you start the study, you should wear the device at least 6 hours.  How far should I keep my phone from the device?  During the night, your phone should be within 15 feet.  What happens if I leave the room for restroom or other reasons?  Leaving the room for any reason will not cause any problem. As soon as your get back to the room, both devices will reconnect and will continue to send the sleep data. Can I use my phone during the sleep study?  Yes, you can use your phone as usual during the study. But it is recommended to put your watchpat one on when you are ready to go to bed.  How will I get my study results?  A soon as you completed your study, your sleep data will be sent to the provider. They will then share the results with you when they are ready.     Follow-Up: At [facility], you and your health needs are our priority.  As part of our continuing mission to  provide you with exceptional heart care, our providers are all part of one team.  This team includes your primary Cardiologist (physician) and Advanced Practice Providers or APPs (Physician Assistants and Nurse Practitioners) who all work together to provide you with the care you need, when you need it.  Your next appointment:   3 month(s)  Provider:   Dr. Alda Amas  We recommend signing up for the patient portal called "MyChart".  Sign up information is provided on this After Visit Summary.  MyChart is used to connect with patients for Virtual Visits (Telemedicine).  Patients are able to view lab/test results, encounter notes, upcoming appointments, etc.  Non-urgent messages can be sent to your provider as well.   To learn more about what you can do with MyChart, go to [email or web address].   Other Instructions    Please report to Radiology at the [facility] Main Entrance 30 minutes early for your test.  [address] [place], [place] [address]                How to Prepare for Your Cardiac PET/CT Stress Test:  Nothing to eat or drink, except water, 3 hours prior to arrival time.  NO caffeine/decaffeinated products, or chocolate 12 hours prior to arrival. (Please note decaffeinated beverages (teas/coffees) still contain caffeine).  If you have caffeine within 12 hours prior, the test will need to be rescheduled.   Diabetic Preparation: If able to eat breakfast prior to 3 hour fasting, you may take all medications, including your insulin. Do not worry if you miss your breakfast dose of insulin - start at your next meal. If you do not eat prior to 3 hour fast-Hold all diabetes (oral and insulin) medications. Patients who wear a continuous glucose monitor MUST remove the device prior to scanning.  You may take your remaining medications with water.  NO perfume, cologne or lotion on chest or abdomen area. FEMALES - Please avoid wearing dresses to this  appointment.  Total time is 1 to 2 hours; you may want to bring reading material for the waiting time.  IF YOU THINK YOU MAY BE PREGNANT, OR ARE NURSING PLEASE INFORM THE TECHNOLOGIST.  In preparation for your appointment, medication and supplies will be purchased.  Appointment availability is limited, so if you need to cancel or reschedule, please call the Radiology Department Scheduler at [phone] 24 hours in advance to avoid a cancellation fee of $100.00  What to Expect When you Arrive:  Once you arrive and check in for your appointment, you will be taken to a preparation room within the Radiology Department.  A technologist or Nurse will obtain your medical history, verify that you are correctly prepped for the exam, and explain the procedure.  Afterwards, an IV will be started in your arm and electrodes will be placed on your skin for EKG monitoring during the stress portion of the exam. Then you will be escorted to the PET/CT scanner.  There, staff will get you positioned on the scanner and obtain a blood pressure and EKG.  During the exam, you will continue to be connected to the EKG and blood pressure machines.  A small, safe amount of a radioactive tracer will be injected in your IV to obtain a series of pictures of your heart along with an injection of a stress agent.    After your Exam:  It is recommended that you eat a meal and drink a caffeinated beverage to counter act any effects of the stress agent.  Drink plenty of fluids for the remainder of the day and urinate frequently for the first couple of hours after the exam.  Your doctor will inform you of your test results within 7-10 business days.  For more information and frequently asked questions, please visit our website: [email or web address]  For questions about your test or how to prepare for your test, please call: Cardiac Imaging Nurse Navigators Office: [phone]        Signed, Wendie Hamburg, MD  02/19/2024 9:25 AM    [facility]

## 2024-02-19 ENCOUNTER — Encounter: Payer: Self-pay | Admitting: Radiology

## 2024-02-19 ENCOUNTER — Encounter: Payer: Self-pay | Admitting: Cardiology

## 2024-02-19 ENCOUNTER — Ambulatory Visit: Attending: Cardiology | Admitting: Cardiology

## 2024-02-19 ENCOUNTER — Telehealth: Payer: Self-pay | Admitting: Radiology

## 2024-02-19 VITALS — BP 110/70 | HR 99 | Ht 62.0 in | Wt 254.0 lb

## 2024-02-19 DIAGNOSIS — R079 Chest pain, unspecified: Secondary | ICD-10-CM | POA: Diagnosis not present

## 2024-02-19 DIAGNOSIS — I10 Essential (primary) hypertension: Secondary | ICD-10-CM

## 2024-02-19 DIAGNOSIS — R400 Somnolence: Secondary | ICD-10-CM | POA: Diagnosis not present

## 2024-02-19 DIAGNOSIS — E785 Hyperlipidemia, unspecified: Secondary | ICD-10-CM | POA: Diagnosis not present

## 2024-02-19 DIAGNOSIS — R0683 Snoring: Secondary | ICD-10-CM

## 2024-02-19 NOTE — Patient Instructions (Signed)
 Medication Instructions:  Continue current medication *If you need a refill on your cardiac medications before your next appointment, please call your pharmacy*  Lab Work: Mg, bmp, lipid panel If you have labs (blood work) drawn today and your tests are completely normal, you will receive your results only by: MyChart Message (if you have MyChart) OR A paper copy in the mail If you have any lab test that is abnormal or we need to change your treatment, we will call you to review the results.  Testing/Procedures: Echo Your physician has requested that you have an echocardiogram. Echocardiography is a painless test that uses sound waves to create images of your heart. It provides your doctor with information about the size and shape of your heart and how well your heart's chambers and valves are working. This procedure takes approximately one hour. There are no restrictions for this procedure. Please do NOT wear cologne, perfume, aftershave, or lotions (deodorant is allowed). Please arrive 15 minutes prior to your appointment time.  Please note: We ask at that you not bring children with you during ultrasound (echo/ vascular) testing. Due to room size and safety concerns, children are not allowed in the ultrasound rooms during exams. Our front office staff cannot provide observation of children in our lobby area while testing is being conducted. An adult accompanying a patient to their appointment will only be allowed in the ultrasound room at the discretion of the ultrasound technician under special circumstances. We apologize for any inconvenience.   ITMAR \ WatchPAT?  Is a FDA cleared portable home sleep study test that uses a watch and 3 points of contact to monitor 7 different channels, including your heart rate, oxygen saturations, body position, snoring, and chest motion.  The study is easy to use from the comfort of your own home and accurately detect sleep apnea.  Before bed, you attach  the chest sensor, attached the sleep apnea bracelet to your nondominant hand, and attach the finger probe.  After the study, the raw data is downloaded from the watch and scored for apnea events.   For more information: [email or web address]  Patient Testing Instructions:  Do not put battery into the device until bedtime when you are ready to begin the test. Please call the support number if you need assistance after following the instructions below: 24 hour support line- [phone] or ITAMAR support at [phone] (option 2)  Download the IntelWatchPAT One" app through the google play store or App Store  Be sure to turn on or enable access to bluetooth in settlings on your smartphone/ device  Make sure no other bluetooth devices are on and within the vicinity of your smartphone/ device and WatchPAT watch during testing.  Make sure to leave your smart phone/ device plugged in and charging all night.  When ready for bed:  Follow the instructions step by step in the WatchPAT One App to activate the testing device. For additional instructions, including video instruction, visit the WatchPAT One video on Youtube. You can search for WatchPat One within Youtube (video is 4 minutes and 18 seconds) or enter: [email or web address]://[email or web address]?[email or web address]_[email or web address] Please note: You will be prompted to enter a Pin to connect via bluetooth when starting the test. The PIN will be assigned to you when you receive the test.  The device is disposable, but it recommended that you retain the device until you receive a call letting you know the study has been received and the results have been interpreted.  We will let you  know if the study did not transmit to us  properly after the test is completed. You do not need to call us  to confirm the receipt of the test.  Please complete the test within 48 hours of receiving PIN.   Frequently Asked Questions:  What is Watch Deatra Face one?  A single use fully disposable home  sleep apnea testing device and will not need to be returned after completion.  What are the requirements to use WatchPAT one?  The be able to have a successful watchpat one sleep study, you should have your Watch pat one device, your smart phone, watch pat one app, your PIN number and Internet access What type of phone do I need?  You should have a smart phone that uses Android 5.1 and above or any Iphone with IOS 10 and above How can I download the WatchPAT one app?  Based on your device type search for WatchPAT one app either in google play for android devices or APP store for Iphone's Where will I get my PIN for the study?  Your PIN will be provided by your physician's office. It is used for authentication and if you lose/forget your PIN, please reach out to your providers office.  I do not have Internet at home. Can I do WatchPAT one study?  WatchPAT One needs Internet connection throughout the night to be able to transmit the sleep data. You can use your home/local internet or your cellular's data package. However, it is always recommended to use home/local Internet. It is estimated that between 20MB-30MB will be used with each study.However, the application will be looking for space in the phone to start the study.  What happens if I lose internet or bluetooth connection?  During the internet disconnection, your phone will not be able to transmit the sleep data. All the data, will be stored in your phone. As soon as the internet connection is back on, the phone will being sending the sleep data. During the bluetooth disconnection, WatchPAT one will not be able to to send the sleep data to your phone. Data will be kept in the WatchPAT one until two devices have bluetooth connection back on. As soon as the connection is back on, WatchPAT one will send the sleep data to the phone.  How long do I need to wear the WatchPAT one?  After you start the study, you should wear the device at least 6  hours.  How far should I keep my phone from the device?  During the night, your phone should be within 15 feet.  What happens if I leave the room for restroom or other reasons?  Leaving the room for any reason will not cause any problem. As soon as your get back to the room, both devices will reconnect and will continue to send the sleep data. Can I use my phone during the sleep study?  Yes, you can use your phone as usual during the study. But it is recommended to put your watchpat one on when you are ready to go to bed.  How will I get my study results?  A soon as you completed your study, your sleep data will be sent to the provider. They will then share the results with you when they are ready.     Follow-Up: At [facility], you and your health needs are our priority.  As part of our continuing mission to provide you with exceptional heart care, our providers are all part  of one team.  This team includes your primary Cardiologist (physician) and Advanced Practice Providers or APPs (Physician Assistants and Nurse Practitioners) who all work together to provide you with the care you need, when you need it.  Your next appointment:   3 month(s)  Provider:   Dr. Alda Amas  We recommend signing up for the patient portal called "MyChart".  Sign up information is provided on this After Visit Summary.  MyChart is used to connect with patients for Virtual Visits (Telemedicine).  Patients are able to view lab/test results, encounter notes, upcoming appointments, etc.  Non-urgent messages can be sent to your provider as well.   To learn more about what you can do with MyChart, go to [email or web address].   Other Instructions    Please report to Radiology at the [facility] Main Entrance 30 minutes early for your test.  [address] [place], [place] [address]                How to Prepare for Your Cardiac PET/CT Stress Test:  Nothing to eat or drink, except water, 3  hours prior to arrival time.  NO caffeine/decaffeinated products, or chocolate 12 hours prior to arrival. (Please note decaffeinated beverages (teas/coffees) still contain caffeine).  If you have caffeine within 12 hours prior, the test will need to be rescheduled.   Diabetic Preparation: If able to eat breakfast prior to 3 hour fasting, you may take all medications, including your insulin. Do not worry if you miss your breakfast dose of insulin - start at your next meal. If you do not eat prior to 3 hour fast-Hold all diabetes (oral and insulin) medications. Patients who wear a continuous glucose monitor MUST remove the device prior to scanning.  You may take your remaining medications with water.  NO perfume, cologne or lotion on chest or abdomen area. FEMALES - Please avoid wearing dresses to this appointment.  Total time is 1 to 2 hours; you may want to bring reading material for the waiting time.  IF YOU THINK YOU MAY BE PREGNANT, OR ARE NURSING PLEASE INFORM THE TECHNOLOGIST.  In preparation for your appointment, medication and supplies will be purchased.  Appointment availability is limited, so if you need to cancel or reschedule, please call the Radiology Department Scheduler at [phone] 24 hours in advance to avoid a cancellation fee of $100.00  What to Expect When you Arrive:  Once you arrive and check in for your appointment, you will be taken to a preparation room within the Radiology Department.  A technologist or Nurse will obtain your medical history, verify that you are correctly prepped for the exam, and explain the procedure.  Afterwards, an IV will be started in your arm and electrodes will be placed on your skin for EKG monitoring during the stress portion of the exam. Then you will be escorted to the PET/CT scanner.  There, staff will get you positioned on the scanner and obtain a blood pressure and EKG.  During the exam, you will continue to be connected to the EKG and  blood pressure machines.  A small, safe amount of a radioactive tracer will be injected in your IV to obtain a series of pictures of your heart along with an injection of a stress agent.    After your Exam:  It is recommended that you eat a meal and drink a caffeinated beverage to counter act any effects of the stress agent.  Drink plenty of fluids for the  remainder of the day and urinate frequently for the first couple of hours after the exam.  Your doctor will inform you of your test results within 7-10 business days.  For more information and frequently asked questions, please visit our website: [email or web address]  For questions about your test or how to prepare for your test, please call: Cardiac Imaging Nurse Navigators Office: [phone]

## 2024-02-19 NOTE — Telephone Encounter (Signed)
 error

## 2024-02-19 NOTE — Telephone Encounter (Signed)
 Patient agreement reviewed and signed on 02/19/2024.  WatchPAT issued to patient on 02/19/2024 by Nathalie Baize. Patient aware to not open the WatchPAT box until contacted with the activation PIN. Patient profile initialized in CloudPAT on 02/19/2024 by Jenise Mixer. Device serial number: [id number]  Please list Reason for Call as Advice Only and type "WatchPAT issued to patient" in the comment box.

## 2024-02-20 LAB — BASIC METABOLIC PANEL WITH GFR
BUN/Creatinine Ratio: 25 — ABNORMAL HIGH (ref 9–23)
BUN: 23 mg/dL (ref 6–24)
CO2: 21 mmol/L (ref 20–29)
Calcium: 9.9 mg/dL (ref 8.7–10.2)
Chloride: 102 mmol/L (ref 96–106)
Creatinine, Ser: 0.91 mg/dL (ref 0.57–1.00)
Glucose: 96 mg/dL (ref 70–99)
Potassium: 4.4 mmol/L (ref 3.5–5.2)
Sodium: 139 mmol/L (ref 134–144)
eGFR: 73 mL/min/{1.73_m2} (ref >59)

## 2024-02-20 LAB — LIPID PANEL
Chol/HDL Ratio: 3.8 ratio (ref 0.0–4.4)
Cholesterol, Total: 177 mg/dL (ref 100–199)
HDL: 46 mg/dL (ref >39)
LDL Chol Calc (NIH): 110 mg/dL — ABNORMAL HIGH (ref 0–99)
Triglycerides: 117 mg/dL (ref 0–149)
VLDL Cholesterol Cal: 21 mg/dL (ref 5–40)

## 2024-02-20 LAB — MAGNESIUM: Magnesium: 2 mg/dL (ref 1.6–2.3)

## 2024-02-22 ENCOUNTER — Other Ambulatory Visit: Payer: Self-pay | Admitting: *Deleted

## 2024-02-22 DIAGNOSIS — R931 Abnormal findings on diagnostic imaging of heart and coronary circulation: Secondary | ICD-10-CM

## 2024-02-22 DIAGNOSIS — E785 Hyperlipidemia, unspecified: Secondary | ICD-10-CM

## 2024-02-22 NOTE — Progress Notes (Unsigned)
 Made patient aware that per Dr. Alda Amas, stress PET has been ordered 6/26 @8 :40am and order pending review Patient verbalized an understanding.

## 2024-03-30 ENCOUNTER — Ambulatory Visit: Payer: Self-pay | Admitting: Cardiology

## 2024-03-30 ENCOUNTER — Ambulatory Visit (HOSPITAL_COMMUNITY)
Admission: RE | Admit: 2024-03-30 | Discharge: 2024-03-30 | Disposition: A | Discharge status: Home / Self Care | Source: Ambulatory Visit | Attending: Cardiology | Admitting: Cardiology

## 2024-03-30 DIAGNOSIS — R079 Chest pain, unspecified: Secondary | ICD-10-CM | POA: Diagnosis present

## 2024-03-30 LAB — ECHOCARDIOGRAM COMPLETE: S' Lateral: 2.83 cm

## 2024-04-05 ENCOUNTER — Encounter (HOSPITAL_COMMUNITY): Payer: Self-pay

## 2024-04-07 ENCOUNTER — Ambulatory Visit

## 2024-05-26 ENCOUNTER — Telehealth: Payer: Self-pay

## 2024-05-26 NOTE — Telephone Encounter (Incomplete)
 Ordering provider: Dr. Kate Associated diagnoses:  I10 ( hypertension) R40 ( Somnolence) R06.83 (Snoring) WatchPAT PA obtained on 05/26/2024 by Dena JAYSON Hesselbach, CMA. Authorization: No PA required per [facility] provider portal  DecisionID #: [id number] Patient notified of PIN (1234) on 05/26/2024 via {Notification Method:30571}.  Phone note routed to covering staff for follow-up.  Instructions for covering staff:  Please contact patient in 2 weeks if WatchPAT study results are not available yet. Remind patient to complete test.  If patient declines to proceed with test, please confirm that box is unopened and remind patient to return it to the office within 30 days. Route phone note to [facility] DIV SLEEP STUDIES pool for tracking.  If box has been opened, please route phone note to CV DIV SLEEP STUDIES pool to have device de-initialized and processed for billing.

## 2024-06-05 NOTE — Progress Notes (Deleted)
 Cardiology Office Note:    Date:  06/05/2024   ID:  [name], DOB May 13, 1966, MRN [id number]  PCP:  [facility]  Cardiologist:  None  Electrophysiologist:  None   Referring MD: [facility]*   No chief complaint on file.   History of Present Illness:    [name] is a 58 y.o. female with a hx of uterine cancer, COPD, fibromyalgia, hypertension, BPD who presents for follow-up.  She was referred by Dr. Bari for evaluation of chest pain, initially seen 02/19/2024.  She reports she has been having chest pain over the last few months, occurs when she is stressed.  Describes sharp stabbing pain in center of chest, can last up to 1 minute.  She does not exercise but does get short of breath with exertion.  She denies any lightheadedness, syncope, lower extremity edema.  She smoked 2 packs/day x 40 years, switched to vaping in 2020.  Family history includes paternal grandfather died of MI.  Echocardiogram in 2017 showed EF 65 to 70%, mild mitral regurgitation.  Coronary CTA in 2015 showed normal coronary arteries.  Stress PET ordered at initial clinic visit 02/2024, but has not been done.  Echocardiogram 03/30/2024 showed normal biventricular function, grade 1 diastolic dysfunction, elevated ascending aorta measuring 40 mm, no significant valvular disease.  Since last clinic visit,  Past Medical History:  Diagnosis Date   Anemia    Anxiety    Bipolar 1 disorder (HCC)    Cancer (HCC)    uterine CA/- resulted in hysterectomy, pt. 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 hyperthermia  variant post op 03/07/2016   Primary localized osteoarthritis of left knee 03/04/2016   Primary localized osteoarthritis of right knee 07/15/2016   PUD (peptic ulcer disease)    Spinal stenosis     Past Surgical History:  Procedure Laterality Date   ABDOMINAL HYSTERECTOMY  1998   CARDIOVASCULAR STRESS TEST  03/2010   Lexiscan Myoview: EF 55%, mild breast attenuation, no ischemia or scar   DILATION AND CURETTAGE OF UTERUS  1986   lost a son   esophageal ulcer     JOINT REPLACEMENT     KNEE ARTHROSCOPY Right 2005   TONSILLECTOMY     TOTAL KNEE ARTHROPLASTY Left 03/04/2016   Procedure: TOTAL KNEE ARTHROPLASTY;  Surgeon: Fonda Olmsted, MD;  Location: [facility] OR;  Service: Orthopedics;  Laterality: Left;   TOTAL KNEE ARTHROPLASTY Right 07/15/2016   Procedure: TOTAL KNEE ARTHROPLASTY;  Surgeon: Fonda Olmsted, MD;  Location: [facility] OR;  Service: Orthopedics;  Laterality: Right;   TOTAL SHOULDER ARTHROPLASTY Left 07/17/2015   Procedure: TOTAL SHOULDER ARTHROPLASTY;  Surgeon: Fonda Olmsted, MD;  Location: [facility] OR;  Service: Orthopedics;  Laterality: Left;   TRANSTHORACIC ECHOCARDIOGRAM  03/2010   EF 50-55%, normal LV size, no WMAs   TUBAL LIGATION  1990    Current Medications: No outpatient medications have been marked as taking for the 06/08/24 encounter (Appointment) with Kate Lonni CROME, MD.     Allergies:   Aripiprazole,  Bupropion, Lurasidone hcl, and Naproxen   Social History   Socioeconomic History   Marital status: Divorced    Spouse name: Not on file   Number of children: Not on file   Years of education: Not on file   Highest education level: Not on file  Occupational History   Not on file  Tobacco Use   Smoking status: Every Day    Current packs/day: 1.00    Average packs/day: 1 pack/day for 36.0 years (36.0 ttl pk-yrs)    Types: E-cigarettes, Cigarettes   Smokeless tobacco: Never  Substance and Sexual Activity   Alcohol  use: Yes    Alcohol /week: 1.0 standard drink of  alcohol     Types: 1 Shots of liquor per week    Comment: occasional   Drug use: No    Types: Morphine , Oxycodone     Comment: goes to pain clinic   Sexual activity: Not on file  Other Topics Concern   Not on file  Social History Narrative   Not on file   Social Drivers of Health   Financial Resource Strain: Low Risk  (02/21/2024)   Received from [facility]   Overall Financial Resource Strain (CARDIA)    Difficulty of Paying Living Expenses: Not hard at all  Food Insecurity: No Food Insecurity (02/21/2024)   Received from [facility]   Hunger Vital Sign    Within the past 12 months, you worried that your food would run out before you got the money to buy more.: Never true    Within the past 12 months, the food you bought just didn't last and you didn't have money to get more.: Never true  Transportation Needs: No Transportation Needs (02/21/2024)   Received from [facility] - Transportation    Lack of Transportation (Medical): No    Lack of Transportation (Non-Medical): No  Physical Activity: Inactive (02/21/2024)   Received [facility]   Exercise Vital Sign    On average, how many days per week do you engage in moderate to strenuous exercise (like a brisk walk)?: 0 days    On average, how many minutes do you engage in exercise at this level?: 30 min  Stress: No Stress Concern Present (02/21/2024)   Received from [facility] - Occupational Stress Questionnaire    Feeling of Stress : Only a little  Social Connections: Moderately Integrated (02/21/2024)   Received from [facility]   Social Network    How would you rate your social network (family, work, friends)?: Adequate participation with social networks     Family History: The patient's family history includes Alzheimer's disease in her mother; Diabetes in an other family member; Hypertension in her mother and another family member; Schizophrenia in her  mother; Stroke in her father.  ROS:   Please see the history of present illness.     All other systems reviewed and are negative.  EKGs/Labs/Other Studies Reviewed:    The following studies were reviewed today:   EKG:   02/19/2024: Normal sinus rhythm, heart rate 82, QTc 488, nonspecific T wave flattening  Recent Labs: 02/12/2024: Hemoglobin 12.3; Platelets 367 02/19/2024: BUN 23; Creatinine, Ser 0.91; Magnesium  2.0; Potassium 4.4; Sodium 139  Recent Lipid Panel    Component Value Date/Time   CHOL 177 02/19/2024 0945   TRIG 117 02/19/2024 0945   HDL 46 02/19/2024 0945   CHOLHDL 3.8 02/19/2024 0945   CHOLHDL 3.2 12/26/2013 0400   VLDL  30 12/26/2013 0400   LDLCALC 110 (H) 02/19/2024 0945    Physical Exam:    VS:  There were no vitals taken for this visit.    Wt Readings from Last 3 Encounters:  02/19/24 254 lb (115.2 kg)  02/12/24 262 lb (118.8 kg)  05/02/22 250 lb (113.4 kg)     GEN:  Well nourished, well developed in no acute distress HEENT: Normal NECK: No JVD; No carotid bruits LYMPHATICS: No lymphadenopathy CARDIAC: RRR, no murmurs, rubs, gallops RESPIRATORY:  Clear to auscultation without rales, wheezing or rhonchi  ABDOMEN: Soft, non-tender, non-distended MUSCULOSKELETAL:  No edema; No deformity  SKIN: Warm and dry NEUROLOGIC:  Alert and oriented x 3 PSYCHIATRIC:  Normal affect   ASSESSMENT:    No diagnosis found.  PLAN:    Chest pain: Atypical in description but could represent angina as occurs with stress.  Also with dyspnea on exertion that could represent anginal equivalent.  Given BMI, not a good coronary CTA candidate.  Not a treadmill candidate.   -Stress PET ordered at initial clinic visit 02/2024, but has not been done.  Echocardiogram 03/30/2024 showed normal biventricular function, grade 1 diastolic dysfunction, elevated ascending aorta measuring 40 mm, no significant valvular disease.  Hypertension: On hydrochlorothiazide  12.5 mg daily.  Check  BMET/ magnesium ***  Tobacco use: Counseled on the risk of vaping and cessation recommended.  Reports she called 1800 QUIT NOW, has patches and lozenges and is working on cessation  Morbid obesity: There is no height or weight on file to calculate BMI. Started Mounjaro.  Diet/exercise recommended  Snoring/observed apnea/daytime somnolence: Recommend Itamar sleep study.  STOP-BANG 6***  Pulmonary nodule: Noted on recent CT chest 02/2024.  Follow-up CT recommended in 6 months***  QT prolongation: QTc 488.  Check electrolytes.  Avoid QT prolonging agents***  Dilated aorta: Measured 40 mm on echocardiogram 02/2024.  RTC in 3 months***    Medication Adjustments/Labs and Tests Ordered: Current medicines are reviewed at length with the patient today.  Concerns regarding medicines are outlined above.  No orders of the defined types were placed in this encounter.  No orders of the defined types were placed in this encounter.   There are no Patient Instructions on file for this visit.   Signed, Lonni LITTIE Nanas, MD  06/05/2024 2:51 PM    [facility]

## 2024-06-08 ENCOUNTER — Ambulatory Visit: Attending: Cardiovascular Disease | Admitting: Cardiology

## 2024-07-22 ENCOUNTER — Encounter: Payer: Self-pay | Admitting: Physician Assistant
